# Patient Record
Sex: Male | Born: 1970
Health system: Southern US, Community
[De-identification: ages and names within clinical notes are randomized; demographics above are authoritative.]

## PROBLEM LIST (undated history)

## (undated) DIAGNOSIS — K219 Gastro-esophageal reflux disease without esophagitis: Secondary | ICD-10-CM

## (undated) DIAGNOSIS — I10 Essential (primary) hypertension: Secondary | ICD-10-CM

## (undated) DIAGNOSIS — F419 Anxiety disorder, unspecified: Secondary | ICD-10-CM

## (undated) DIAGNOSIS — M5126 Other intervertebral disc displacement, lumbar region: Secondary | ICD-10-CM

## (undated) DIAGNOSIS — G4730 Sleep apnea, unspecified: Secondary | ICD-10-CM

## (undated) HISTORY — PX: OTHER SURGICAL HISTORY: SHX169

## (undated) HISTORY — PX: PILONIDAL CYST EXCISION: SHX744

---

## 2006-01-09 ENCOUNTER — Ambulatory Visit (HOSPITAL_COMMUNITY): Admission: RE | Admit: 2006-01-09 | Discharge: 2006-01-10 | Payer: Self-pay | Admitting: Neurological Surgery

## 2011-01-03 ENCOUNTER — Other Ambulatory Visit: Payer: Self-pay | Admitting: Specialist

## 2011-01-03 ENCOUNTER — Ambulatory Visit (HOSPITAL_COMMUNITY)
Admission: RE | Admit: 2011-01-03 | Discharge: 2011-01-03 | Disposition: A | Discharge status: Home / Self Care | Payer: 59 | Source: Ambulatory Visit | Attending: Specialist | Admitting: Specialist

## 2011-01-03 ENCOUNTER — Other Ambulatory Visit (HOSPITAL_COMMUNITY): Payer: Self-pay | Admitting: Specialist

## 2011-01-03 ENCOUNTER — Encounter (HOSPITAL_COMMUNITY): Payer: 59

## 2011-01-03 DIAGNOSIS — M5126 Other intervertebral disc displacement, lumbar region: Secondary | ICD-10-CM | POA: Insufficient documentation

## 2011-01-03 DIAGNOSIS — Z01812 Encounter for preprocedural laboratory examination: Secondary | ICD-10-CM | POA: Insufficient documentation

## 2011-01-03 DIAGNOSIS — Z01818 Encounter for other preprocedural examination: Secondary | ICD-10-CM

## 2011-01-03 LAB — PROTIME-INR: INR: 1.02 (ref 0.00–1.49)

## 2011-01-03 LAB — COMPREHENSIVE METABOLIC PANEL
CO2: 29 mEq/L (ref 19–32)
Calcium: 10.1 mg/dL (ref 8.4–10.5)
GFR calc non Af Amer: >90 mL/min (ref >90)
Total Bilirubin: 0.6 mg/dL (ref 0.3–1.2)
Total Protein: 7.4 g/dL (ref 6.0–8.3)

## 2011-01-03 LAB — URINALYSIS, ROUTINE W REFLEX MICROSCOPIC
Hgb urine dipstick: NEGATIVE
Ketones, ur: NEGATIVE mg/dL
Leukocytes, UA: NEGATIVE
Nitrite: NEGATIVE
Specific Gravity, Urine: 1.016 (ref 1.005–1.030)
pH: 6.5 (ref 5.0–8.0)

## 2011-01-03 LAB — SURGICAL PCR SCREEN: MRSA, PCR: NEGATIVE

## 2011-01-03 LAB — CBC
HCT: 45.5 % (ref 39.0–52.0)
Hemoglobin: 16 g/dL (ref 13.0–17.0)
MCV: 93.8 fL (ref 78.0–100.0)
RDW: 12.4 % (ref 11.5–15.5)

## 2011-01-03 LAB — APTT: aPTT: 29 seconds (ref 24–37)

## 2011-01-05 ENCOUNTER — Ambulatory Visit (HOSPITAL_COMMUNITY): Payer: 59

## 2011-01-05 ENCOUNTER — Ambulatory Visit (HOSPITAL_COMMUNITY)
Admission: RE | Admit: 2011-01-05 | Discharge: 2011-01-06 | Disposition: A | Discharge status: Home / Self Care | Payer: 59 | Source: Ambulatory Visit | Attending: Specialist | Admitting: Specialist

## 2011-01-05 DIAGNOSIS — G4733 Obstructive sleep apnea (adult) (pediatric): Secondary | ICD-10-CM | POA: Insufficient documentation

## 2011-01-05 DIAGNOSIS — F17200 Nicotine dependence, unspecified, uncomplicated: Secondary | ICD-10-CM | POA: Insufficient documentation

## 2011-01-05 DIAGNOSIS — Z01812 Encounter for preprocedural laboratory examination: Secondary | ICD-10-CM | POA: Insufficient documentation

## 2011-01-05 DIAGNOSIS — M5126 Other intervertebral disc displacement, lumbar region: Secondary | ICD-10-CM | POA: Insufficient documentation

## 2011-01-06 NOTE — Op Note (Signed)
Ryan Holland, Ryan NO.:  [id number]  MEDICAL RECORD NO.:  [id number]  LOCATION:  1608                         FACILITY:  [facility]  PHYSICIAN:  Jene Every, M.D.    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  POSTOPERATIVE DIAGNOSES:  Spinal stenosis, herniated nucleus pulposus at L4-L5 left and L5-S1 right.  PROCEDURE PERFORMED: 1. Lumbar decompression L4-L5 left with foraminotomies of L4 and L5     with microdiskectomy L4-L5, left. 2. Lumbar decompression L5-S1 on the right with separate fascial     incision with foraminotomies at L5-S1 right with microdiscectomy L5-     S1 right.  ANESTHESIA:  General.  ASSISTANT:  Roma Schanz, P.A.  BRIEF HISTORY:  This is a 40 year old with bilateral L5 radiculopathy, myotomal weakness, dermatomal dysesthesias, refractory to conservative treatment.  He had an MRI indicating a disk herniation, lateral recess stenosis at L4-L5 on the right and he had a disk herniation, lateral recess stenosis at L5-S1 on the left.  He was indicated for decompression at both levels given the positive neural tension signs and EHL weakness bilaterally as well as diminished plantar flexion on the left.  Risks and benefits were discussed including bleeding, infection, damage to neurovascular structures, CSF leakage, epidural fibrosis, adjacent segment disease, need for fusion in future, DVT, PE, anesthetic complications, etc.  TECHNIQUE:  The patient in supine position after induction of adequate general anesthesia and 2 g Kefzol, he was placed prone on the [place] frame.  All bony prominences were well padded.  Lumbar region was prepped and draped in usual sterile fashion.  Two 18-gauge spinal needles were utilized to localize the L4-L5 and L5-S1  interspace, confirmed with x-ray.  Incision was made on the left and between L4 and L5, and on the right between L5 and S1.  In the dorsolumbar fascia, paraspinous muscle was elevated from L4-L5 on the left and L5-S1 on the right.  Operating microscope was draped and brought in the surgical field.  Hemilaminotomy of the caudad edge of L4 was performed with a 3 mm Kerrison preserving the pars.  There was a hypertrophic facet here in the superior articulating process of L4, with a prominent spur.  I used a micro curette to skeletonize this process, detach ligamentum flavum, and perform a partial medial hemifacetectomy with a 2 mm Kerrison. Perform a foraminotomy of L5 identifying the L5 root, which was compressing the lateral recess severely, gently mobilizing it medially. There was an epidural venous plexus, tethering the root.  This was cauterized and mobilized.  Focal HNP was noted.  We had performed a foraminotomy at L4 as well.  An annulotomy was performed and copious disk material was removed with a straight and a micropituitary as well as an upbiting pituitary with intermittent release of the L5 root. Following the diskectomy, the L5 root was decompressed to release 1 cm of excursion medial to the pedicle.  Hockey-stick probe passed freely up the foramen of L4 and L5.  Disk space copiously irrigated with  antibiotic irrigation as was the laminotomy defect.  No evidence of CSF leakage or active bleeding and a confirmatory radiograph obtained, identified the L4-L5 interspace following completion.  The dorsolumbar fascia was then reapproximated with #1 Vicryl with interrupted figure-of- 8 sutures.  Attention was then turned towards the right L5-S1. McCullough retractor was placed there in a similar fashion.  The detached ligamentum flavum from the cephalad edge of S1 on the caudad edge of L5 performed a hemilaminotomy.  Ligamentum flavum removed from the interspace.  Neuro patties were  used at both L4-L5 and L5-S1 to protect the neural elements.  Severe lateral recess stenosis was noted multifactorial due to ligamentum flavum hypertrophy and a focal disk herniation.  After foraminotomy of S1 was performed, we gently mobilized the S1 nerve root medially, identified a focal HNP.  Performed an annulotomy and performed a diskectomy with  micro and straight pituitary. Multiple small fragments were then removed.  Foraminotomy of L5 was performed.  There was slight stenosis noted here.  Disk space copiously irrigated with antibiotic irrigation.  At least we had an adequate decompression of the L5 and S1 nerve roots with a cm of excursion of the S1 nerve root without tension.  We checked beneath the thecal sac, the axilla, the root, and both foramen as we did at L4-L5. No residual disk herniation or compression of L5 or S1 nerve root. Inspection revealed no CSF leakage or active bleeding.  We therefore, removed the [facility] retractor.  Copiously irrigated the wound, closed the fascia with #1 Vicryl interrupted figure-of-8 sutures and the subcu with 2-0 Vicryl simple sutures.  Wounds were reapproximated with 4-0 subcuticular Prolene, reinforced with Steri-Strips.  Sterile dressing applied.  Placed supine on the hospital bed, extubated without difficulty, and transported to the recovery room in satisfactory condition.  The patient tolerated the procedure well.  No complications.  Assistant Roma Schanz, was utilized for general intermittent neural traction, closure, and irrigation.  Minimal blood loss.     Jene Every, M.D.     Cordelia Pen  D:  01/05/2011  T:  01/05/2011  Job:  [id number]  Electronically Signed by Jene Every M.D. on 01/06/2011 04:37:25 PM

## 2011-01-30 ENCOUNTER — Ambulatory Visit: Payer: 59 | Attending: Specialist | Admitting: Physical Therapy

## 2011-01-30 DIAGNOSIS — M545 Low back pain, unspecified: Secondary | ICD-10-CM | POA: Insufficient documentation

## 2011-01-30 DIAGNOSIS — R5381 Other malaise: Secondary | ICD-10-CM | POA: Insufficient documentation

## 2011-01-30 DIAGNOSIS — IMO0001 Reserved for inherently not codable concepts without codable children: Secondary | ICD-10-CM | POA: Insufficient documentation

## 2011-02-02 ENCOUNTER — Ambulatory Visit: Payer: 59 | Admitting: *Deleted

## 2011-02-07 ENCOUNTER — Ambulatory Visit: Payer: 59 | Attending: Specialist | Admitting: *Deleted

## 2011-02-07 DIAGNOSIS — M545 Low back pain, unspecified: Secondary | ICD-10-CM | POA: Insufficient documentation

## 2011-02-07 DIAGNOSIS — R5381 Other malaise: Secondary | ICD-10-CM | POA: Insufficient documentation

## 2011-02-07 DIAGNOSIS — IMO0001 Reserved for inherently not codable concepts without codable children: Secondary | ICD-10-CM | POA: Insufficient documentation

## 2011-02-09 ENCOUNTER — Ambulatory Visit: Payer: 59 | Admitting: *Deleted

## 2011-02-14 ENCOUNTER — Ambulatory Visit: Payer: 59 | Admitting: *Deleted

## 2011-02-16 ENCOUNTER — Ambulatory Visit: Payer: 59 | Admitting: *Deleted

## 2011-02-21 ENCOUNTER — Encounter: Payer: 59 | Admitting: Physical Therapy

## 2011-02-23 ENCOUNTER — Ambulatory Visit: Payer: 59 | Admitting: Physical Therapy

## 2011-03-02 ENCOUNTER — Ambulatory Visit: Payer: 59 | Admitting: Physical Therapy

## 2011-03-09 ENCOUNTER — Ambulatory Visit: Payer: 59 | Attending: Specialist | Admitting: Physical Therapy

## 2011-03-09 DIAGNOSIS — IMO0001 Reserved for inherently not codable concepts without codable children: Secondary | ICD-10-CM | POA: Insufficient documentation

## 2011-03-09 DIAGNOSIS — R5381 Other malaise: Secondary | ICD-10-CM | POA: Insufficient documentation

## 2011-03-09 DIAGNOSIS — M545 Low back pain, unspecified: Secondary | ICD-10-CM | POA: Insufficient documentation

## 2011-03-14 ENCOUNTER — Ambulatory Visit: Payer: 59 | Admitting: *Deleted

## 2011-03-16 ENCOUNTER — Ambulatory Visit: Payer: 59 | Admitting: *Deleted

## 2011-03-21 ENCOUNTER — Ambulatory Visit: Payer: 59 | Admitting: *Deleted

## 2011-03-23 ENCOUNTER — Encounter: Payer: 59 | Admitting: *Deleted

## 2011-06-13 ENCOUNTER — Other Ambulatory Visit: Payer: Self-pay | Admitting: Specialist

## 2011-06-13 DIAGNOSIS — M545 Low back pain, unspecified: Secondary | ICD-10-CM

## 2011-06-15 ENCOUNTER — Ambulatory Visit
Admission: RE | Admit: 2011-06-15 | Discharge: 2011-06-15 | Disposition: A | Discharge status: Home / Self Care | Payer: Worker's Compensation | Source: Ambulatory Visit | Attending: Specialist | Admitting: Specialist

## 2011-06-15 VITALS — BP 132/74 | HR 82

## 2011-06-15 DIAGNOSIS — M545 Low back pain: Secondary | ICD-10-CM

## 2011-06-15 MED ORDER — IOHEXOL 180 MG/ML  SOLN
17.0000 mL | Freq: Once | INTRAMUSCULAR | Status: AC | PRN
  Administered 2011-06-15: 17 mL via INTRATHECAL

## 2011-06-15 MED ORDER — DIAZEPAM 5 MG PO TABS
10.0000 mg | ORAL_TABLET | Freq: Once | ORAL | Status: AC
  Administered 2011-06-15: 10 mg via ORAL

## 2011-06-15 MED ORDER — MEPERIDINE HCL 100 MG/ML IJ SOLN
75.0000 mg | Freq: Once | INTRAMUSCULAR | Status: AC
  Administered 2011-06-15: 75 mg via INTRAMUSCULAR

## 2011-06-15 MED ORDER — HYDROMORPHONE HCL 4 MG PO TABS
4.0000 mg | ORAL_TABLET | Freq: Once | ORAL | Status: AC
  Administered 2011-06-15: 4 mg via ORAL

## 2011-06-15 MED ORDER — ONDANSETRON HCL 4 MG/2ML IJ SOLN
4.0000 mg | Freq: Once | INTRAMUSCULAR | Status: AC
  Administered 2011-06-15: 4 mg via INTRAMUSCULAR

## 2011-06-15 NOTE — Progress Notes (Signed)
Patient states his pain level is 8-9/10 half an hour after prior medication.  jkl

## 2011-06-15 NOTE — Discharge Instructions (Signed)
Myelogram Discharge Instructions  1. Go home and rest quietly for the next 24 hours.  It is important to lie flat for the next 24 hours.  Get up only to go to the restroom.  You may lie in the bed or on a couch on your back, your stomach, your left side or your right side.  You may have one pillow under your head.  You may have pillows between your knees while you are on your side or under your knees while you are on your back.  2. DO NOT drive today.  Recline the seat as far back as it will go, while still wearing your seat belt, on the way home.  3. You may get up to go to the bathroom as needed.  You may sit up for 10 minutes to eat.  You may resume your normal diet and medications unless otherwise indicated.  Drink lots of extra fluids today and tomorrow.  4. The incidence of headache, nausea, or vomiting is about 5% (one in 20 patients).  If you develop a headache, lie flat and drink plenty of fluids until the headache goes away.  Caffeinated beverages may be helpful.  If you develop severe nausea and vomiting or a headache that does not go away with flat bed rest, call [phone].  5. You may resume normal activities after your 24 hours of bed rest is over; however, do not exert yourself strongly or do any heavy lifting tomorrow. If when you get up you have a headache when standing, go back to bed and force fluids for another 24 hours.  6. Call your physician for a follow-up appointment.  The results of your myelogram will be sent directly to your physician by the following day.  7. If you have any questions or if complications develop after you arrive home, please call [phone].  Discharge instructions have been explained to the patient.  The patient, or the person responsible for the patient, fully understands these instructions.       May resume citalopram on June 16, 2011, after 9:30 am.

## 2011-06-15 NOTE — Progress Notes (Signed)
Patient resting quietly with eyes closed; in no apparent distress.  jkl

## 2011-06-15 NOTE — Progress Notes (Signed)
Pt states he has been off citalopram for the past 2 days. dd

## 2011-07-07 ENCOUNTER — Other Ambulatory Visit: Payer: Self-pay | Admitting: Otolaryngology

## 2011-07-17 ENCOUNTER — Encounter (HOSPITAL_COMMUNITY): Payer: Self-pay | Admitting: Pharmacy Technician

## 2011-07-24 ENCOUNTER — Encounter (HOSPITAL_COMMUNITY): Payer: Self-pay

## 2011-07-24 ENCOUNTER — Encounter (HOSPITAL_COMMUNITY)
Admission: RE | Admit: 2011-07-24 | Discharge: 2011-07-24 | Disposition: A | Discharge status: Home / Self Care | Payer: Worker's Compensation | Source: Ambulatory Visit | Attending: Specialist | Admitting: Specialist

## 2011-07-24 ENCOUNTER — Ambulatory Visit (HOSPITAL_COMMUNITY)
Admission: RE | Admit: 2011-07-24 | Discharge: 2011-07-24 | Disposition: A | Discharge status: Home / Self Care | Payer: Worker's Compensation | Source: Ambulatory Visit | Attending: Otolaryngology | Admitting: Otolaryngology

## 2011-07-24 DIAGNOSIS — M5137 Other intervertebral disc degeneration, lumbosacral region: Secondary | ICD-10-CM | POA: Insufficient documentation

## 2011-07-24 DIAGNOSIS — M51379 Other intervertebral disc degeneration, lumbosacral region without mention of lumbar back pain or lower extremity pain: Secondary | ICD-10-CM | POA: Insufficient documentation

## 2011-07-24 DIAGNOSIS — Z01818 Encounter for other preprocedural examination: Secondary | ICD-10-CM | POA: Insufficient documentation

## 2011-07-24 HISTORY — DX: Sleep apnea, unspecified: G47.30

## 2011-07-24 HISTORY — DX: Gastro-esophageal reflux disease without esophagitis: K21.9

## 2011-07-24 HISTORY — DX: Other intervertebral disc displacement, lumbar region: M51.26

## 2011-07-24 HISTORY — DX: Essential (primary) hypertension: I10

## 2011-07-24 HISTORY — DX: Anxiety disorder, unspecified: F41.9

## 2011-07-24 LAB — URINALYSIS, ROUTINE W REFLEX MICROSCOPIC
Bilirubin Urine: NEGATIVE
Glucose, UA: NEGATIVE mg/dL
Hgb urine dipstick: NEGATIVE
Specific Gravity, Urine: 1.014 (ref 1.005–1.030)
pH: 6 (ref 5.0–8.0)

## 2011-07-24 LAB — COMPREHENSIVE METABOLIC PANEL
AST: 44 U/L — ABNORMAL HIGH (ref 0–37)
Albumin: 4.2 g/dL (ref 3.5–5.2)
Chloride: 99 mEq/L (ref 96–112)
Creatinine, Ser: 0.86 mg/dL (ref 0.50–1.35)
Total Bilirubin: 0.3 mg/dL (ref 0.3–1.2)

## 2011-07-24 LAB — CBC
MCV: 93.1 fL (ref 78.0–100.0)
Platelets: 275 10*3/uL (ref 150–400)
RDW: 12.3 % (ref 11.5–15.5)
WBC: 9.1 10*3/uL (ref 4.0–10.5)

## 2011-07-24 LAB — DIFFERENTIAL
Basophils Relative: 1 % (ref 0–1)
Lymphocytes Relative: 27 % (ref 12–46)
Lymphs Abs: 2.4 10*3/uL (ref 0.7–4.0)
Monocytes Absolute: 0.8 10*3/uL (ref 0.1–1.0)
Monocytes Relative: 8 % (ref 3–12)
Neutro Abs: 5.5 10*3/uL (ref 1.7–7.7)

## 2011-07-24 NOTE — Patient Instructions (Signed)
YOUR SURGERY IS SCHEDULED ON:  WED  5/22  AT 10:30 AM  REPORT TO [place] [facility]ORT STAY [facility] AT:  8:30 AM      PHONE # FOR SHORT STAY IS [phone]  DO NOT EAT OR DRINK ANYTHING AFTER MIDNIGHT THE NIGHT BEFORE YOUR SURGERY.  YOU MAY BRUSH YOUR TEETH, RINSE OUT YOUR MOUTH--BUT NO WATER, NO FOOD, NO CHEWING GUM, NO MINTS, NO CANDIES, NO CHEWING TOBACCO.  PLEASE TAKE THE FOLLOWING MEDICATIONS THE AM OF YOUR SURGERY WITH A FEW SIPS OF WATER:  CELEXA,  OXYCODONE / ACETAMINOPHEN IF NEEDED FOR PAIN.    IF YOU USE INHALERS--USE YOUR INHALERS THE AM OF YOUR SURGERY AND BRING INHALERS TO THE HOSPITAL -TAKE TO SURGERY.    IF YOU ARE DIABETIC:  DO NOT TAKE ANY DIABETIC MEDICATIONS THE AM OF YOUR SURGERY.  IF YOU TAKE INSULIN IN THE EVENINGS--PLEASE ONLY TAKE 1/2 NORMAL EVENING DOSE THE NIGHT BEFORE YOUR SURGERY.  NO INSULIN THE AM OF YOUR SURGERY.  IF YOU HAVE SLEEP APNEA AND USE CPAP OR BIPAP--PLEASE BRING THE MASK --NOT THE MACHINE-NOT THE TUBING   -JUST THE MASK. DO NOT BRING VALUABLES, MONEY, CREDIT CARDS.  CONTACT LENS, DENTURES / PARTIALS, GLASSES SHOULD NOT BE WORN TO SURGERY AND IN MOST CASES-HEARING AIDS WILL NEED TO BE REMOVED.  BRING YOUR GLASSES CASE, ANY EQUIPMENT NEEDED FOR YOUR CONTACT LENS. FOR PATIENTS ADMITTED TO THE HOSPITAL--CHECK OUT TIME THE DAY OF DISCHARGE IS 11:00 AM.  ALL INPATIENT ROOMS ARE PRIVATE - WITH BATHROOM, TELEPHONE, TELEVISION AND WIFI INTERNET. IF YOU ARE BEING DISCHARGED THE SAME DAY OF YOUR SURGERY--YOU CAN NOT DRIVE YOURSELF HOME--AND SHOULD NOT GO HOME ALONE BY TAXI OR BUS.  NO DRIVING OR OPERATING MACHINERY FOR 24 HOURS FOLLOWING ANESTHESIA / PAIN MEDICATIONS.                            SPECIAL INSTRUCTIONS:  CHLORHEXIDINE SOAP SHOWER (other brand names are Betasept and Hibiclens ) PLEASE SHOWER WITH CHLORHEXIDINE THE NIGHT BEFORE YOUR SURGERY AND THE AM OF YOUR SURGERY. DO NOT USE CHLORHEXIDINE ON YOUR FACE OR PRIVATE AREAS--YOU MAY USE YOUR NORMAL SOAP  THOSE AREAS AND YOUR NORMAL SHAMPOO.  WOMEN SHOULD AVOID SHAVING UNDER ARMS AND SHAVING LEGS 48 HOURS BEFORE USING CHLORHEXIDINE TO AVOID SKIN IRRITATION.  DO NOT USE IF ALLERGIC TO CHLORHEXIDINE.  PLEASE READ OVER ANY  FACT SHEETS THAT YOU WERE GIVEN: MRSA INFORMATION, INCENTIVE SPIROMETER INFORMATION.

## 2011-07-24 NOTE — Pre-Procedure Instructions (Addendum)
CBC WITH DIFF, CMET, UA AND LUMBAR SPINE XRAY WERE DONE TODAY - PREOP-AT [facility].   PT HAS EKG REPORT FROM [facility] THAT WAS DONE 01/05/11.  -REPORT IN EPIC AND COPY ON PT'S CHART. PT HAS CXR REPORT FROM [facility] THAT WAS DONE 09/21/10. REPORT IS ON PT'S CHART.

## 2011-07-26 ENCOUNTER — Ambulatory Visit (HOSPITAL_COMMUNITY): Payer: Worker's Compensation

## 2011-07-26 ENCOUNTER — Encounter (HOSPITAL_COMMUNITY): Payer: Self-pay | Admitting: Anesthesiology

## 2011-07-26 ENCOUNTER — Ambulatory Visit (HOSPITAL_COMMUNITY): Payer: Worker's Compensation | Admitting: Anesthesiology

## 2011-07-26 ENCOUNTER — Encounter (HOSPITAL_COMMUNITY)
Admission: RE | Disposition: A | Discharge status: Home / Self Care | Payer: Self-pay | Source: Ambulatory Visit | Attending: Specialist

## 2011-07-26 ENCOUNTER — Encounter (HOSPITAL_COMMUNITY): Payer: Self-pay | Admitting: *Deleted

## 2011-07-26 ENCOUNTER — Observation Stay (HOSPITAL_COMMUNITY)
Admission: RE | Admit: 2011-07-26 | Discharge: 2011-07-28 | Disposition: A | Discharge status: Home / Self Care | Payer: Worker's Compensation | Source: Ambulatory Visit | Attending: Specialist | Admitting: Specialist

## 2011-07-26 DIAGNOSIS — M48061 Spinal stenosis, lumbar region without neurogenic claudication: Secondary | ICD-10-CM | POA: Insufficient documentation

## 2011-07-26 DIAGNOSIS — Z981 Arthrodesis status: Secondary | ICD-10-CM | POA: Insufficient documentation

## 2011-07-26 DIAGNOSIS — I10 Essential (primary) hypertension: Secondary | ICD-10-CM | POA: Insufficient documentation

## 2011-07-26 DIAGNOSIS — Z79899 Other long term (current) drug therapy: Secondary | ICD-10-CM | POA: Insufficient documentation

## 2011-07-26 DIAGNOSIS — M5126 Other intervertebral disc displacement, lumbar region: Principal | ICD-10-CM | POA: Insufficient documentation

## 2011-07-26 DIAGNOSIS — IMO0002 Reserved for concepts with insufficient information to code with codable children: Secondary | ICD-10-CM

## 2011-07-26 DIAGNOSIS — G4730 Sleep apnea, unspecified: Secondary | ICD-10-CM | POA: Insufficient documentation

## 2011-07-26 DIAGNOSIS — K219 Gastro-esophageal reflux disease without esophagitis: Secondary | ICD-10-CM | POA: Insufficient documentation

## 2011-07-26 HISTORY — PX: LUMBAR LAMINECTOMY/DECOMPRESSION MICRODISCECTOMY: SHX5026

## 2011-07-26 SURGERY — LUMBAR LAMINECTOMY/DECOMPRESSION MICRODISCECTOMY
Anesthesia: General | Site: Back | Laterality: Right | Wound class: Clean

## 2011-07-26 MED ORDER — POLYETHYLENE GLYCOL 3350 17 G PO PACK
17.0000 g | PACK | Freq: Every day | ORAL | Status: DC | PRN
  Filled 2011-07-26: qty 1

## 2011-07-26 MED ORDER — OXYCODONE-ACETAMINOPHEN 5-325 MG PO TABS
1.0000 | ORAL_TABLET | ORAL | Status: DC | PRN
  Administered 2011-07-26 – 2011-07-27 (×2): 1 via ORAL
  Filled 2011-07-26 (×2): qty 1

## 2011-07-26 MED ORDER — LACTATED RINGERS IV SOLN
INTRAVENOUS | Status: DC
  Administered 2011-07-26: 12:00:00 via INTRAVENOUS
  Administered 2011-07-26: 1000 mL via INTRAVENOUS

## 2011-07-26 MED ORDER — OXYCODONE-ACETAMINOPHEN 7.5-325 MG PO TABS: 1.0000 | ORAL_TABLET | ORAL | Status: DC | PRN

## 2011-07-26 MED ORDER — PHENOL 1.4 % MT LIQD: 1.0000 | OROMUCOSAL | Status: DC | PRN

## 2011-07-26 MED ORDER — SODIUM CHLORIDE 0.9 % IJ SOLN: 3.0000 mL | Freq: Two times a day (BID) | INTRAMUSCULAR | Status: DC

## 2011-07-26 MED ORDER — ONDANSETRON HCL 4 MG/2ML IJ SOLN: 4.0000 mg | INTRAMUSCULAR | Status: DC | PRN

## 2011-07-26 MED ORDER — LISINOPRIL 10 MG PO TABS
10.0000 mg | ORAL_TABLET | Freq: Every day | ORAL | Status: DC
  Administered 2011-07-26 – 2011-07-28 (×3): 10 mg via ORAL
  Filled 2011-07-26 (×3): qty 1

## 2011-07-26 MED ORDER — METHOCARBAMOL 500 MG PO TABS
500.0000 mg | ORAL_TABLET | Freq: Four times a day (QID) | ORAL | Status: DC | PRN
  Filled 2011-07-26: qty 1

## 2011-07-26 MED ORDER — CHLORHEXIDINE GLUCONATE 4 % EX LIQD
60.0000 mL | Freq: Once | CUTANEOUS | Status: DC
  Filled 2011-07-26: qty 60

## 2011-07-26 MED ORDER — THROMBIN 5000 UNITS EX SOLR
CUTANEOUS | Status: DC | PRN
  Administered 2011-07-26: 5000 [IU] via TOPICAL

## 2011-07-26 MED ORDER — FENTANYL CITRATE 0.05 MG/ML IJ SOLN: 25.0000 ug | INTRAMUSCULAR | Status: DC | PRN

## 2011-07-26 MED ORDER — CITALOPRAM HYDROBROMIDE 40 MG PO TABS
40.0000 mg | ORAL_TABLET | Freq: Every day | ORAL | Status: DC
  Administered 2011-07-27 – 2011-07-28 (×2): 40 mg via ORAL
  Filled 2011-07-26 (×3): qty 1

## 2011-07-26 MED ORDER — ACETAMINOPHEN 650 MG RE SUPP: 650.0000 mg | RECTAL | Status: DC | PRN

## 2011-07-26 MED ORDER — METHOCARBAMOL 100 MG/ML IJ SOLN
500.0000 mg | Freq: Four times a day (QID) | INTRAVENOUS | Status: DC | PRN
  Administered 2011-07-26 – 2011-07-27 (×3): 500 mg via INTRAVENOUS
  Filled 2011-07-26 (×3): qty 5

## 2011-07-26 MED ORDER — ONDANSETRON HCL 4 MG/2ML IJ SOLN
INTRAMUSCULAR | Status: DC | PRN
  Administered 2011-07-26: 4 mg via INTRAVENOUS

## 2011-07-26 MED ORDER — CEFAZOLIN SODIUM-DEXTROSE 2-3 GM-% IV SOLR
2.0000 g | Freq: Three times a day (TID) | INTRAVENOUS | Status: AC
  Administered 2011-07-26 – 2011-07-27 (×2): 2 g via INTRAVENOUS
  Filled 2011-07-26 (×3): qty 50

## 2011-07-26 MED ORDER — ACETAMINOPHEN 325 MG PO TABS: 650.0000 mg | ORAL_TABLET | ORAL | Status: DC | PRN

## 2011-07-26 MED ORDER — HYDROMORPHONE HCL PF 1 MG/ML IJ SOLN
INTRAMUSCULAR | Status: AC
  Administered 2011-07-26: 1 mg via INTRAVENOUS
  Filled 2011-07-26: qty 1

## 2011-07-26 MED ORDER — MIDAZOLAM HCL 5 MG/5ML IJ SOLN
INTRAMUSCULAR | Status: DC | PRN
  Administered 2011-07-26: 2 mg via INTRAVENOUS

## 2011-07-26 MED ORDER — HYDROMORPHONE HCL PF 1 MG/ML IJ SOLN
0.2500 mg | INTRAMUSCULAR | Status: DC | PRN
  Administered 2011-07-26 (×4): 0.5 mg via INTRAVENOUS

## 2011-07-26 MED ORDER — HYDROMORPHONE HCL PF 1 MG/ML IJ SOLN
0.5000 mg | INTRAMUSCULAR | Status: DC | PRN
  Administered 2011-07-26 – 2011-07-27 (×4): 1 mg via INTRAVENOUS
  Filled 2011-07-26 (×3): qty 1

## 2011-07-26 MED ORDER — OXYCODONE HCL 5 MG PO TABS
2.5000 mg | ORAL_TABLET | ORAL | Status: DC | PRN
  Administered 2011-07-26 – 2011-07-27 (×2): 2.5 mg via ORAL
  Filled 2011-07-26 (×2): qty 1

## 2011-07-26 MED ORDER — ROCURONIUM BROMIDE 100 MG/10ML IV SOLN
INTRAVENOUS | Status: DC | PRN
  Administered 2011-07-26: 10 mg via INTRAVENOUS
  Administered 2011-07-26: 50 mg via INTRAVENOUS
  Administered 2011-07-26: 20 mg via INTRAVENOUS

## 2011-07-26 MED ORDER — NEOSTIGMINE METHYLSULFATE 1 MG/ML IJ SOLN
INTRAMUSCULAR | Status: DC | PRN
  Administered 2011-07-26: 4 mg via INTRAVENOUS

## 2011-07-26 MED ORDER — KETOROLAC TROMETHAMINE 30 MG/ML IJ SOLN
15.0000 mg | Freq: Once | INTRAMUSCULAR | Status: AC | PRN
  Administered 2011-07-26: 30 mg via INTRAVENOUS

## 2011-07-26 MED ORDER — DOCUSATE SODIUM 100 MG PO CAPS
100.0000 mg | ORAL_CAPSULE | Freq: Two times a day (BID) | ORAL | Status: DC
  Administered 2011-07-26 – 2011-07-28 (×4): 100 mg via ORAL
  Filled 2011-07-26 (×5): qty 1

## 2011-07-26 MED ORDER — SUFENTANIL CITRATE 50 MCG/ML IV SOLN
INTRAVENOUS | Status: DC | PRN
  Administered 2011-07-26 (×2): 5 ug via INTRAVENOUS
  Administered 2011-07-26: 15 ug via INTRAVENOUS
  Administered 2011-07-26: 5 ug via INTRAVENOUS
  Administered 2011-07-26: 10 ug via INTRAVENOUS

## 2011-07-26 MED ORDER — SODIUM CHLORIDE 0.9 % IR SOLN
Status: DC | PRN
  Administered 2011-07-26: 12:00:00

## 2011-07-26 MED ORDER — BUPIVACAINE-EPINEPHRINE 0.5% -1:200000 IJ SOLN
INTRAMUSCULAR | Status: DC | PRN
  Administered 2011-07-26: 50 mL

## 2011-07-26 MED ORDER — MENTHOL 3 MG MT LOZG
1.0000 | LOZENGE | OROMUCOSAL | Status: DC | PRN
  Filled 2011-07-26: qty 9

## 2011-07-26 MED ORDER — PROPOFOL 10 MG/ML IV EMUL
INTRAVENOUS | Status: DC | PRN
  Administered 2011-07-26: 200 mg via INTRAVENOUS

## 2011-07-26 MED ORDER — LISINOPRIL-HYDROCHLOROTHIAZIDE 10-12.5 MG PO TABS: 1.0000 | ORAL_TABLET | Freq: Every day | ORAL | Status: DC

## 2011-07-26 MED ORDER — SODIUM CHLORIDE 0.9 % IJ SOLN: 3.0000 mL | INTRAMUSCULAR | Status: DC | PRN

## 2011-07-26 MED ORDER — PROMETHAZINE HCL 25 MG/ML IJ SOLN: 6.2500 mg | INTRAMUSCULAR | Status: DC | PRN

## 2011-07-26 MED ORDER — SODIUM CHLORIDE 0.9 % IV SOLN
INTRAVENOUS | Status: DC
  Administered 2011-07-26 – 2011-07-27 (×2): via INTRAVENOUS

## 2011-07-26 MED ORDER — SODIUM CHLORIDE 0.9 % IV SOLN: 250.0000 mL | INTRAVENOUS | Status: DC

## 2011-07-26 MED ORDER — LIDOCAINE HCL (CARDIAC) 20 MG/ML IV SOLN
INTRAVENOUS | Status: DC | PRN
  Administered 2011-07-26: 75 mg via INTRAVENOUS

## 2011-07-26 MED ORDER — CEFAZOLIN SODIUM-DEXTROSE 2-3 GM-% IV SOLR
2.0000 g | INTRAVENOUS | Status: AC
  Administered 2011-07-26: 2 g via INTRAVENOUS

## 2011-07-26 MED ORDER — HYDROCHLOROTHIAZIDE 12.5 MG PO CAPS
12.5000 mg | ORAL_CAPSULE | Freq: Every day | ORAL | Status: DC
  Administered 2011-07-26 – 2011-07-28 (×3): 12.5 mg via ORAL
  Filled 2011-07-26 (×3): qty 1

## 2011-07-26 MED ORDER — BISACODYL 10 MG RE SUPP: 10.0000 mg | Freq: Every day | RECTAL | Status: DC | PRN

## 2011-07-26 SURGICAL SUPPLY — 53 items
APL SKNCLS STERI-STRIP NONHPOA (GAUZE/BANDAGES/DRESSINGS) ×1
BAG SPEC THK2 15X12 ZIP CLS (MISCELLANEOUS) ×1
BAG ZIPLOCK 12X15 (MISCELLANEOUS) ×2 IMPLANT
BENZOIN TINCTURE PRP APPL 2/3 (GAUZE/BANDAGES/DRESSINGS) ×3 IMPLANT
CATH FOLEY 2WAY SLVR  5CC 14FR (CATHETERS) ×1
CATH FOLEY 2WAY SLVR 5CC 14FR (CATHETERS) IMPLANT
CHLORAPREP W/TINT 26ML (MISCELLANEOUS) IMPLANT
CLEANER TIP ELECTROSURG 2X2 (MISCELLANEOUS) ×2 IMPLANT
CLOTH BEACON ORANGE TIMEOUT ST (SAFETY) ×2 IMPLANT
DECANTER SPIKE VIAL GLASS SM (MISCELLANEOUS) ×2 IMPLANT
DRAPE MICROSCOPE LEICA (MISCELLANEOUS) ×3 IMPLANT
DRAPE POUCH INSTRU U-SHP 10X18 (DRAPES) ×2 IMPLANT
DRAPE SURG 17X11 SM STRL (DRAPES) ×2 IMPLANT
DRSG EMULSION OIL 3X3 NADH (GAUZE/BANDAGES/DRESSINGS) ×2 IMPLANT
DRSG PAD ABDOMINAL 8X10 ST (GAUZE/BANDAGES/DRESSINGS) ×2 IMPLANT
DRSG TELFA 4X5 ISLAND ADH (GAUZE/BANDAGES/DRESSINGS) IMPLANT
DURAPREP 26ML APPLICATOR (WOUND CARE) ×2 IMPLANT
ELECT REM PT RETURN 9FT ADLT (ELECTROSURGICAL) ×2
ELECTRODE REM PT RTRN 9FT ADLT (ELECTROSURGICAL) ×1 IMPLANT
GLOVE BIOGEL PI IND STRL 8 (GLOVE) ×1 IMPLANT
GLOVE BIOGEL PI INDICATOR 8 (GLOVE) ×1
GLOVE ECLIPSE 6.5 STRL STRAW (GLOVE) ×2 IMPLANT
GLOVE INDICATOR 6.5 STRL GRN (GLOVE) ×4 IMPLANT
GLOVE SURG SS PI 8.0 STRL IVOR (GLOVE) ×4 IMPLANT
GOWN PREVENTION PLUS LG XLONG (DISPOSABLE) ×2 IMPLANT
GOWN STRL REIN XL XLG (GOWN DISPOSABLE) ×2 IMPLANT
KIT BASIN OR (CUSTOM PROCEDURE TRAY) ×2 IMPLANT
KIT POSITIONING SURG ANDREWS (MISCELLANEOUS) ×2 IMPLANT
MANIFOLD NEPTUNE II (INSTRUMENTS) ×2 IMPLANT
NDL SPNL 18GX3.5 QUINCKE PK (NEEDLE) ×3 IMPLANT
NEEDLE SPNL 18GX3.5 QUINCKE PK (NEEDLE) ×6 IMPLANT
PATTIES SURGICAL .5 X.5 (GAUZE/BANDAGES/DRESSINGS) IMPLANT
PATTIES SURGICAL .75X.75 (GAUZE/BANDAGES/DRESSINGS) IMPLANT
PATTIES SURGICAL 1X1 (DISPOSABLE) IMPLANT
SPONGE GAUZE 4X4 12PLY (GAUZE/BANDAGES/DRESSINGS) ×1 IMPLANT
SPONGE SURGIFOAM ABS GEL 100 (HEMOSTASIS) ×2 IMPLANT
STAPLER VISISTAT (STAPLE) ×1 IMPLANT
STRIP CLOSURE SKIN 1/2X4 (GAUZE/BANDAGES/DRESSINGS) IMPLANT
SUT PROLENE 3 0 PS 2 (SUTURE) IMPLANT
SUT VIC AB 0 CT1 27 (SUTURE)
SUT VIC AB 0 CT1 27XBRD ANTBC (SUTURE) IMPLANT
SUT VIC AB 1 CT1 27 (SUTURE)
SUT VIC AB 1 CT1 27XBRD ANTBC (SUTURE) ×1 IMPLANT
SUT VIC AB 1-0 CT2 27 (SUTURE) ×2 IMPLANT
SUT VIC AB 2-0 CT1 27 (SUTURE)
SUT VIC AB 2-0 CT1 TAPERPNT 27 (SUTURE) ×1 IMPLANT
SUT VIC AB 2-0 CT2 27 (SUTURE) ×4 IMPLANT
SUT VICRYL 0 27 CT2 27 ABS (SUTURE) ×2 IMPLANT
SUT VICRYL 0 UR6 27IN ABS (SUTURE) IMPLANT
SYRINGE 10CC LL (SYRINGE) ×4 IMPLANT
TAPE CLOTH SURG 4X10 WHT LF (GAUZE/BANDAGES/DRESSINGS) ×1 IMPLANT
TRAY LAMINECTOMY (CUSTOM PROCEDURE TRAY) ×2 IMPLANT
YANKAUER SUCT BULB TIP NO VENT (SUCTIONS) ×2 IMPLANT

## 2011-07-26 NOTE — Anesthesia Postprocedure Evaluation (Signed)
  Anesthesia Post-op Note  Patient: Ryan Holland  Procedure(s) Performed: Procedure(s) (LRB): LUMBAR LAMINECTOMY/DECOMPRESSION MICRODISCECTOMY (Right)  Patient Location: PACU  Anesthesia Type: General  Level of Consciousness: awake and alert   Airway and Oxygen Therapy: Patient Spontanous Breathing  Post-op Pain: mild  Post-op Assessment: Post-op Vital signs reviewed, Patient's Cardiovascular Status Stable, Respiratory Function Stable, Patent Airway and No signs of Nausea or vomiting  Post-op Vital Signs: stable  Complications: No apparent anesthesia complications

## 2011-07-26 NOTE — Transfer of Care (Signed)
Immediate Anesthesia Transfer of Care Note  Patient: Ryan Holland  Procedure(s) Performed: Procedure(s) (LRB): LUMBAR LAMINECTOMY/DECOMPRESSION MICRODISCECTOMY (Right)  Patient Location: PACU  Anesthesia Type: General  Level of Consciousness: awake, alert  and patient cooperative  Airway & Oxygen Therapy: Patient Spontanous Breathing and Patient connected to face mask oxygen  Post-op Assessment: Report given to PACU RN, Post -op Vital signs reviewed and stable, Patient moving all extremities and Patient moving all extremities X 4  Post vital signs: Reviewed and stable  Complications: No apparent anesthesia complications

## 2011-07-26 NOTE — Anesthesia Preprocedure Evaluation (Addendum)
Anesthesia Evaluation  Patient identified by MRN, date of birth, ID band Patient awake    Reviewed: Allergy & Precautions, H&P , NPO status , Patient's Chart, lab work & pertinent test results  Airway Mallampati: II TM Distance: <3 FB Neck ROM: Full    Dental No notable dental hx.    Pulmonary sleep apnea and Continuous Positive Airway Pressure Ventilation , Current Smoker,  breath sounds clear to auscultation  Pulmonary exam normal       Cardiovascular hypertension, Pt. on medications Rhythm:Regular Rate:Normal     Neuro/Psych negative neurological ROS  negative psych ROS   GI/Hepatic negative GI ROS, Neg liver ROS, GERD-  ,  Endo/Other  Morbid obesity  Renal/GU negative Renal ROS  negative genitourinary   Musculoskeletal negative musculoskeletal ROS (+)   Abdominal   Peds negative pediatric ROS (+)  Hematology negative hematology ROS (+)   Anesthesia Other Findings   Reproductive/Obstetrics negative OB ROS                          Anesthesia Physical Anesthesia Plan  ASA: III  Anesthesia Plan: General   Post-op Pain Management:    Induction: Intravenous  Airway Management Planned: Oral ETT  Additional Equipment:   Intra-op Plan:   Post-operative Plan: Extubation in OR  Informed Consent: I have reviewed the patients History and Physical, chart, labs and discussed the procedure including the risks, benefits and alternatives for the proposed anesthesia with the patient or authorized representative who has indicated his/her understanding and acceptance.   Dental advisory given  Plan Discussed with: CRNA  Anesthesia Plan Comments:         Anesthesia Quick Evaluation

## 2011-07-26 NOTE — H&P (Signed)
Ryan Holland is an 41 y.o. male.   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 Hypertension   . GERD (gastroesophageal reflux disease)     OCCAS-WOULD USE OTC MED IF NEEDED  . Anxiety   . HNP (herniated nucleus pulposus), lumbar     PAIN IN LOWER BACK AND PAIN DOWN BOTH LEGS   . Sleep apnea     HAS CPAP-DOES NOT USE-DOES NOT KNOW SETTINGS    Past Surgical History  Procedure Date  . 01/05/2011 lumbar decompression and microdiscectomy   . Cervical fusion  c4-5    . Pilonidal cyst excision     History reviewed. No pertinent family history. Social History:  reports that he has been smoking Cigarettes.  He has a 20 pack-year smoking history. He has never used smokeless tobacco. He reports that he drinks alcohol. He reports that he does not use illicit drugs.  Allergies:  Allergies  Allergen Reactions  . Other     LEMONE-sores in mouth    Medications Prior to Admission  Medication Sig Dispense Refill  . citalopram (CELEXA) 40 MG tablet Take 40 mg by mouth daily with breakfast.      . cyclobenzaprine (FLEXERIL) 10 MG tablet Take 10 mg by mouth 3 (three) times daily as needed. For muscle spasms      . ibuprofen (ADVIL,MOTRIN) 200 MG tablet Take 400-600 mg by mouth every 6 (six) hours as needed. Patient takes 2 to 3 tablets by mouth every 4 hours as needed For pain      . lisinopril-hydrochlorothiazide (PRINZIDE,ZESTORETIC) 10-12.5 MG per tablet Take 1 tablet by mouth daily with breakfast.      . oxyCODONE-acetaminophen (PERCOCET) 7.5-325 MG per tablet Take 1 tablet by mouth every 4 (four) hours as needed. Take 1 tablet by mouth every 4 to 6 hours as needed for pain        Results for orders placed during the hospital encounter of 07/24/11 (from the past 48 hour(s))  CBC     Status: Abnormal   Collection Time   07/24/11 10:55 AM      Component Value Range Comment   WBC 9.1  4.0 - 10.5 (K/uL)    RBC 4.90  4.22 - 5.81 (MIL/uL)    Hemoglobin 16.5  13.0 - 17.0 (g/dL)    HCT 16.1  09.6 - 04.5 (%)    MCV 93.1  78.0 - 100.0 (fL)    MCH 33.7  26.0 - 34.0 (pg)    MCHC 36.2 (*) 30.0 - 36.0 (g/dL)    RDW 40.9  81.1 - 91.4 (%)    Platelets 275  150 - 400 (K/uL)   COMPREHENSIVE METABOLIC PANEL     Status: Abnormal   Collection Time   07/24/11 10:55 AM      Component Value Range Comment   Sodium 135  135 - 145 (mEq/L)    Potassium 4.1  3.5 - 5.1 (mEq/L)    Chloride 99  96 - 112 (mEq/L)    CO2 27  19 - 32 (mEq/L)    Glucose, Bld 104 (*) 70 - 99 (mg/dL)    BUN 12  6 - 23 (mg/dL)    Creatinine, Ser 7.82  0.50 - 1.35 (mg/dL)    Calcium 9.4  8.4 - 10.5 (mg/dL)    Total Protein 7.5  6.0 - 8.3 (g/dL)    Albumin 4.2  3.5 - 5.2 (g/dL)  AST 44 (*) 0 - 37 (U/L)    ALT 57 (*) 0 - 53 (U/L)    Alkaline Phosphatase 48  39 - 117 (U/L)    Total Bilirubin 0.3  0.3 - 1.2 (mg/dL)    GFR calc non Af Amer >90  >90 (mL/min)    GFR calc Af Amer >90  >90 (mL/min)   DIFFERENTIAL     Status: Normal   Collection Time   07/24/11 10:55 AM      Component Value Range Comment   Neutrophils Relative 60  43 - 77 (%)    Neutro Abs 5.5  1.7 - 7.7 (K/uL)    Lymphocytes Relative 27  12 - 46 (%)    Lymphs Abs 2.4  0.7 - 4.0 (K/uL)    Monocytes Relative 8  3 - 12 (%)    Monocytes Absolute 0.8  0.1 - 1.0 (K/uL)    Eosinophils Relative 4  0 - 5 (%)    Eosinophils Absolute 0.4  0.0 - 0.7 (K/uL)    Basophils Relative 1  0 - 1 (%)    Basophils Absolute 0.1  0.0 - 0.1 (K/uL)    Dg Lumbar Spine 2-3 Views  07/24/2011  *RADIOLOGY REPORT*  Clinical Data: Preoperative evaluation prior to lumbar spine surgery for level determination.  LUMBAR SPINE - 2-3 VIEW  Comparison: CT myelogram dated 06/15/2011.  Findings: Five non-rib bearing lumbar vertebrae.  The last open disc space is the L5 S1 level, corresponding to the labeling on the CT myelogram.  Mild to moderate disc space narrowing at multiple levels with multiple small  Schmorl's nodes.  Minimal anterior spur formation at multiple levels.  Mild facet degenerative changes inferiorly.  No pars defects or subluxations seen.  IMPRESSION: Mild degenerative changes, as described above.  Original Report Authenticated By: Darrol Angel, M.D.    Review of Systems  Constitutional: Negative.   Skin: Negative.   Neurological: Positive for sensory change and focal weakness.  All other systems reviewed and are negative.    Blood pressure 136/84, pulse 88, temperature 99.1 F (37.3 C), resp. rate 20, SpO2 100.00%. Physical Exam  Vitals reviewed. Constitutional: He is oriented to person, place, and time. He appears well-developed.  HENT:  Head: Normocephalic.  Eyes: Pupils are equal, round, and reactive to light.  Neck: Normal range of motion.  Cardiovascular: Normal rate.   Respiratory: Effort normal.  GI: Soft.  Musculoskeletal:       +SLR right. Decreased sensation S1 right. Negative SLR left     Neurological: He is alert and oriented to person, place, and time.  Skin: Skin is warm and dry.  Psychiatric: He has a normal mood and affect.     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 Plan Decompression L5S1 right possible L$% left. Risks discussed.  Arlena Marsan C 07/26/2011, 10:51 AM

## 2011-07-26 NOTE — Brief Op Note (Signed)
07/26/2011  12:50 PM  PATIENT:  Ryan Holland  41 y.o. male  PRE-OPERATIVE DIAGNOSIS:  herniated disc lumbar 5 - sacral 1 on right  POST-OPERATIVE DIAGNOSIS:  * No post-op diagnosis entered *  PROCEDURE:  Procedure(s) (LRB): LUMBAR LAMINECTOMY/DECOMPRESSION MICRODISCECTOMY (Right)  SURGEON:  Surgeon(s) and Role:    * Javier Docker, MD - Primary  PHYSICIAN ASSISTANT:   ASSISTANTS: strader   ANESTHESIA:   general  EBL:  Total I/O In: 1000 [I.V.:1000] Out: 400 [Urine:300; Blood:100]  BLOOD ADMINISTERED:none  DRAINS: none   LOCAL MEDICATIONS USED:  MARCAINE     SPECIMEN:  No Specimen  DISPOSITION OF SPECIMEN:  N/A  COUNTS:  YES  TOURNIQUET:  * No tourniquets in log *  DICTATION: .Other Dictation: Dictation Number  [id number]  PLAN OF CARE: Admit to inpatient   PATIENT DISPOSITION:  PACU - hemodynamically stable.   Delay start of Pharmacological VTE agent (>24hrs) due to surgical blood loss or risk of bleeding: yes

## 2011-07-27 ENCOUNTER — Encounter (HOSPITAL_COMMUNITY): Payer: Self-pay | Admitting: Specialist

## 2011-07-27 MED ORDER — HYDROMORPHONE HCL 4 MG PO TABS
4.0000 mg | ORAL_TABLET | ORAL | Status: DC | PRN
  Administered 2011-07-27 – 2011-07-28 (×11): 4 mg via ORAL
  Filled 2011-07-27 (×11): qty 1

## 2011-07-27 MED ORDER — SODIUM CHLORIDE 0.9 % IV SOLN: 2.0000 mg/h | Freq: Once | INTRAVENOUS | Status: DC

## 2011-07-27 MED ORDER — GABAPENTIN 300 MG PO CAPS
300.0000 mg | ORAL_CAPSULE | Freq: Three times a day (TID) | ORAL | Status: DC
  Administered 2011-07-27 – 2011-07-28 (×4): 300 mg via ORAL
  Filled 2011-07-27 (×6): qty 1

## 2011-07-27 MED ORDER — CYCLOBENZAPRINE HCL 10 MG PO TABS
10.0000 mg | ORAL_TABLET | Freq: Three times a day (TID) | ORAL | Status: DC | PRN
  Administered 2011-07-27 – 2011-07-28 (×3): 10 mg via ORAL
  Filled 2011-07-27 (×5): qty 1

## 2011-07-27 MED ORDER — HYDROMORPHONE HCL PF 2 MG/ML IJ SOLN
2.0000 mg | INTRAMUSCULAR | Status: AC
  Administered 2011-07-27: 2 mg via INTRAVENOUS
  Filled 2011-07-27: qty 1

## 2011-07-27 MED ORDER — HYDROMORPHONE HCL 4 MG PO TABS: 4.0000 mg | ORAL_TABLET | ORAL | Status: DC | PRN

## 2011-07-27 MED ORDER — HYDROMORPHONE HCL 2 MG PO TABS
2.0000 mg | ORAL_TABLET | ORAL | Status: DC | PRN
  Administered 2011-07-27 (×5): 2 mg via ORAL
  Filled 2011-07-27 (×5): qty 1

## 2011-07-27 NOTE — Evaluation (Signed)
Physical Therapy Evaluation Patient Details Name: Ryan Holland MRN: [id number] DOB: Jul 20, 1970 Today's Date: 07/27/2011 Time: 4098-1191 PT Time Calculation (min): 16 min  PT Assessment / Plan / Recommendation Clinical Impression  41 y.o. male who is POD #1 for LUMBAR LAMINECTOMY/DECOMPRESSION MICRODISCECTOMY. Pt states this is his 2nd back surgery, first one was November 2012 and he reinjured his back in January 2013 changing a tire. Pt puts forth good effort with mobility but is limited by 10/10 pain. Expect good progress as pain control improves. Will benefit from acute PT to maximize safety and independence with mobility.    PT Assessment  Patient needs continued PT services    Follow Up Recommendations  Home health PT;No PT follow up (HHPT vs. no f/u depending on progress)    Barriers to Discharge None      lEquipment Recommendations  Rolling walker with 5" wheels    Recommendations for Other Services OT consult   Frequency 7X/week    Precautions / Restrictions Precautions Precautions: Back Precaution Comments: instructed pt/wife in back precautions Restrictions Weight Bearing Restrictions: No   Pertinent Vitals/Pain *10/10 back and RLE pain; RN aware, pt premedicated**      Mobility  Bed Mobility Bed Mobility: Sit to Supine Sit to Supine: 4: Min assist Details for Bed Mobility Assistance: VCs for log roll technique Transfers Transfers: Sit to Stand;Stand to Sit Stand to Sit: To bed;5: Supervision;With upper extremity assist Details for Transfer Assistance: movements slow and guarded due to pain Ambulation/Gait Ambulation/Gait Assistance: 4: Min assist Ambulation Distance (Feet): 60 Feet Assistive device: 2 person hand held assist Ambulation/Gait Assistance Details: RW in pt's room was too short, so pt ambulated with HHA of 2. Appropriate height walker was then obtained and placed in pt's room. Gait Pattern: Decreased step length - right;Decreased step length -  left;Antalgic Gait velocity: decreased due to 10/10 pain    Exercises     PT Diagnosis: Difficulty walking;Acute pain  PT Problem List: Decreased strength;Decreased range of motion;Decreased activity tolerance;Decreased mobility;Decreased knowledge of precautions;Decreased knowledge of use of DME;Pain PT Treatment Interventions: DME instruction;Gait training;Stair training;Functional mobility training;Therapeutic activities;Therapeutic exercise;Patient/family education   PT Goals Acute Rehab PT Goals PT Goal Formulation: With patient/family Time For Goal Achievement: 07/31/11 Potential to Achieve Goals: Good Pt will go Supine/Side to Sit: with modified independence PT Goal: Supine/Side to Sit - Progress: Goal set today Pt will go Sit to Supine/Side: with modified independence PT Goal: Sit to Supine/Side - Progress: Goal set today Pt will go Sit to Stand: with modified independence PT Goal: Sit to Stand - Progress: Goal set today Pt will Ambulate: >150 feet;with modified independence;with rolling walker PT Goal: Ambulate - Progress: Goal set today Pt will Go Up / Down Stairs: 3-5 stairs;with rail(s);with supervision PT Goal: Up/Down Stairs - Progress: Goal set today  Visit Information  Last PT Received On: 07/27/11 Assistance Needed: +1    Subjective Data  Subjective: The pain medicine has not provided much relief.  Patient Stated Goal: decrease pain   Prior Functioning  Home Living Lives With: Spouse Available Help at Discharge: Family Type of Home: House Home Access: Stairs to enter Secretary/administrator of Steps: 4 Entrance Stairs-Rails: Can reach both;Left;Right Home Layout: One level Bathroom Shower/Tub: Health visitor: Handicapped height Home Adaptive Equipment: Built-in shower seat;Straight cane Prior Function Level of Independence: Independent with assistive device(s) Able to Take Stairs?: Yes Comments: pt required min A at times to don  shoes/socks Communication Communication: No difficulties  Cognition  Overall Cognitive Status: Appears within functional limits for tasks assessed/performed Arousal/Alertness: Awake/alert Orientation Level: Appears intact for tasks assessed Behavior During Session: [facility] for tasks performed    Extremity/Trunk Assessment Right Upper Extremity Assessment RUE ROM/Strength/Tone: [facility] for tasks assessed Left Upper Extremity Assessment LUE ROM/Strength/Tone: WFL for tasks assessed Right Lower Extremity Assessment RLE ROM/Strength/Tone: Unable to fully assess;Due to pain (ankle DF AROM -15*) RLE Sensation: WFL - Light Touch Left Lower Extremity Assessment LLE ROM/Strength/Tone: Unable to fully assess;Due to pain LLE Sensation: WFL - Light Touch Trunk Assessment Trunk Assessment: Normal   Balance    End of Session PT - End of Session Activity Tolerance: Patient limited by pain Patient left: in bed Nurse Communication: Mobility status   Tamala Ser 07/27/2011, 9:13 AM  [phone]

## 2011-07-27 NOTE — Care Management Note (Signed)
    Page 1 of 2   07/27/2011     4:02:20 PM   CARE MANAGEMENT NOTE 07/27/2011  Patient:  Ryan Holland,Ryan Holland   Account Number:  [id number]  Date Initiated:  07/27/2011  Documentation initiated by:  Lorenda Ishihara  Subjective/Objective Assessment:   41 yo male admitted s/p back surgery. PTA lived at home with spouse.     Action/Plan:   Contact workers comp rep [employer] logistics/support/administrative officer to arranged DME and HH   Anticipated DC Date:  07/28/2011   Anticipated DC Plan:  HOME W HOME HEALTH SERVICES      DC Planning Services  CM consult      [facility] Choice  HOME HEALTH  DURABLE MEDICAL EQUIPMENT   Choice offered to / List presented to:  C-3 Spouse   DME arranged  WALKER - ROLLING        HH arranged  HH-2 PT      Status of service:  Completed, signed off Medicare Important Message given?   (If response is "NO", the following Medicare IM given date fields will be blank) Date Medicare IM given:   Date Additional Medicare IM given:    Discharge Disposition:  HOME W HOME HEALTH SERVICES  Per UR Regulation:  Reviewed for med. necessity/level of care/duration of stay  If discussed at Long Length of Stay Meetings, dates discussed:    Comments:  07-27-11 Lorenda Ishihara RN CM 1600 Spoke with patient and wife in room. PT recommenations for [facility] PT depending on progress. OT recommendation for no further f/u. Wife and patient agreeable to [facility] services, offered choice and they told me who ever the worker's comp rep would recommend. Spoke with [employer], worker's comp rep. She will arrange [facility] PT and RW thru [facility]. She will have rolling walker delivered to patient in his room prior to d/c. Awaiting orders from Dr. Shelle Iron so can fax orders to her.

## 2011-07-27 NOTE — Progress Notes (Signed)
UR complete 

## 2011-07-27 NOTE — Op Note (Signed)
NAMEGEOVANI, Ryan Holland NO.:  [id number]  MEDICAL RECORD NO.:  [id number]  LOCATION:  1529                         FACILITY:  [facility]  PHYSICIAN:  Jene Every, M.D.    DATE OF BIRTH:  1971-03-03  DATE OF PROCEDURE:  07/26/2011 DATE OF DISCHARGE:                              OPERATIVE REPORT   PREOPERATIVE DIAGNOSIS:  Recurrent disk herniation at  DICTATION ENDS HERE.     Jene Every, M.D.     Cordelia Pen  D:  07/26/2011  T:  07/27/2011  Job:  [id number]

## 2011-07-27 NOTE — Progress Notes (Signed)
Physical Therapy Treatment Patient Details Name: Ryan Holland MRN: [id number] DOB: 1970-06-09 Today's Date: 07/27/2011 Time: 4098-1191 PT Time Calculation (min): 17 min  PT Assessment / Plan / Recommendation Comments on Treatment Session  Pt reporting improved pain control, tolerated increased gait distance, is now modified independenct with mobility. Expect will be ready to DC home tomorrow morning. Encouraged pt to walk in halls independently with RW.    Follow Up Recommendations  No PT follow up (HHPT vs. no f/u depending on progress)    Barriers to Discharge None      Equipment Recommendations  Rolling walker with 5" wheels    Recommendations for Other Services OT consult  Frequency 7X/week   Plan Discharge plan remains appropriate    Precautions / Restrictions Precautions Precautions: Back Precaution Comments: reviewed back precautions and log roll technique Restrictions Weight Bearing Restrictions: No   Pertinent Vitals/Pain *5-6/10 in back and RLE; pt premedicated**    Mobility  Bed Mobility Bed Mobility: Right Sidelying to Sit;Rolling Left;Left Sidelying to Sit Rolling Left: 6: Modified independent (Device/Increase time);With rail Left Sidelying to Sit: 6: Modified independent (Device/Increase time);With rails;HOB flat Supine to Sit: 5: Supervision Sit to Supine: 5: Supervision;Other (comment) (increased time) Sit to Sidelying Right: 6: Modified independent (Device/Increase time);With rail;HOB flat Details for Bed Mobility Assistance: pt shows good technique with log roll Transfers Transfers: Sit to Stand;Stand to Sit Sit to Stand: 6: Modified independent (Device/Increase time);From bed;With upper extremity assist Stand to Sit: 6: Modified independent (Device/Increase time);To bed;With upper extremity assist Details for Transfer Assistance: Pt moves slow and guarded due to pain, benefits from increased time Ambulation/Gait Ambulation/Gait Assistance: 6:  Modified independent (Device/Increase time) Ambulation Distance (Feet): 300 Feet Assistive device: Rolling walker Ambulation/Gait Assistance Details: Much improved tolerance of ambulation, decreased pain as compared to PT eval this morning. Gait Pattern: Within Functional Limits Gait velocity: decreased due to 10/10 pain    Exercises     PT Diagnosis: Difficulty walking;Acute pain  PT Problem List: Decreased strength;Decreased range of motion;Decreased activity tolerance;Decreased mobility;Decreased knowledge of precautions;Decreased knowledge of use of DME;Pain PT Treatment Interventions: DME instruction;Gait training;Stair training;Functional mobility training;Therapeutic activities;Therapeutic exercise;Patient/family education   PT Goals Acute Rehab PT Goals PT Goal Formulation: With patient/family Time For Goal Achievement: 07/31/11 Potential to Achieve Goals: Good Pt will go Supine/Side to Sit: with modified independence PT Goal: Supine/Side to Sit - Progress: Met Pt will go Sit to Supine/Side: with modified independence PT Goal: Sit to Supine/Side - Progress: Goal set today Pt will go Sit to Stand: with modified independence PT Goal: Sit to Stand - Progress: Met Pt will Ambulate: >150 feet;with modified independence;with rolling walker PT Goal: Ambulate - Progress: Met Pt will Go Up / Down Stairs: 3-5 stairs;with rail(s);with supervision PT Goal: Up/Down Stairs - Progress: Not met  Visit Information  Last PT Received On: 07/27/11 Assistance Needed: +1    Subjective Data  Subjective: Pt reports improvement with pain control. Patient Stated Goal: to get a nap   Cognition  Overall Cognitive Status: Appears within functional limits for tasks assessed/performed Arousal/Alertness: Awake/alert Orientation Level: Appears intact for tasks assessed Behavior During Session: [facility] for tasks performed    Balance     End of Session PT - End of Session Activity Tolerance: Patient  tolerated treatment well Patient left: in bed Nurse Communication: Mobility status    Tamala Ser 07/27/2011, 12:18 PM [phone]

## 2011-07-27 NOTE — Evaluation (Signed)
Occupational Therapy Evaluation Patient Details Name: Ryan Holland MRN: [id number] DOB: June 28, 1970 Today's Date: 07/27/2011 Time: 0940-1001    OT Assessment / Plan / Recommendation Clinical Impression  Pt appears to be limited by pain. He was premedicated & MD/RN are aware. He is overall Min A LB ADL's and supervision-min guard assist for transfers w/ VC's/back precautions during self care tasks. He has A/E and supportive wife to assist at d/c. Will follow acutely for OT    OT Assessment  Patient needs continued OT Services    Follow Up Recommendations  No OT follow up          Equipment Recommendations  None recommended by OT    Recommendations for Other Services    Frequency  Min 2X/week    Precautions / Restrictions Precautions Precautions: Back Precaution Comments: instructed pt/wife in back precautions Restrictions Weight Bearing Restrictions: No   Pertinent Vitals/Pain 10/10 Pt was premedicated, RN & MD aware    ADL  Eating/Feeding: Simulated;Modified independent Where Assessed - Eating/Feeding: Bed level Grooming: Performed;Wash/dry hands;Supervision/safety Where Assessed - Grooming: Unsupported standing;Other (comment) (standing at sink in bathroom) Upper Body Bathing: Simulated;Supervision/safety Where Assessed - Upper Body Bathing: Unsupported sitting;Unsupported standing Lower Body Bathing: Simulated;Minimal assistance Where Assessed - Lower Body Bathing: Unsupported sitting;Unsupported standing Upper Body Dressing: Simulated;Min guard (secondary to back precautions) Where Assessed - Upper Body Dressing: Unsupported standing;Unsupported sitting Lower Body Dressing: Performed;Minimal assistance (don/doff socks) Where Assessed - Lower Body Dressing: Supine, head of bed flat Toilet Transfer: Performed;Supervision/safety Toilet Transfer Method: Sit to stand;Other (comment) (Ambulated w/ RW Supervision level, limited by pain) Toilet Transfer Equipment: Raised  toilet seat with arms (or 3-in-1 over toilet);Other (comment) (Pt sit/stand toilet w/ 1 hand in middle of RW) Toileting - Clothing Manipulation and Hygiene: Performed;Modified independent;Supervision/safety Where Assessed - Toileting Clothing Manipulation and Hygiene: Standing Tub/Shower Transfer: Simulated;Min guard Tub/Shower Transfer Method: Ambulating;Other (comment) (w/ RW, VC's for safety) Tub/Shower Transfer Equipment: Walk in shower Equipment Used: Rolling walker Transfers/Ambulation Related to ADLs: Pt was overall VC and supervision level  using RW with occasional Min guard assist for simulated walk in shower transfer & Min A LB ADL's/Self care. ADL Comments: Pt appears to be limited by pain. He was premedicated & MD/RN are aware. He is overall Min A LB ADL's and supervision-min guard assist for transfers w/ VC's/back precautions during self care tasks. He has A/E and supportive wife to assist at d/c. Will follow acutely for OT.    OT Diagnosis: Acute pain  OT Problem List: Decreased activity tolerance;Decreased knowledge of use of DME or AE;Decreased knowledge of precautions;Pain OT Treatment Interventions: Self-care/ADL training;Therapeutic activities;DME and/or AE instruction;Patient/family education   OT Goals Acute Rehab OT Goals OT Goal Formulation: With patient Potential to Achieve Goals: Good ADL Goals Pt Will Perform Grooming: with modified independence;Standing at sink ADL Goal: Grooming - Progress: Goal set today Pt Will Perform Lower Body Dressing: with min assist;Sitting, chair;Sit to stand from chair;with adaptive equipment ADL Goal: Lower Body Dressing - Progress: Goal set today Pt Will Transfer to Toilet: with modified independence;with DME;Maintaining back safety precautions;Ambulation ADL Goal: Toilet Transfer - Progress: Goal set today Pt Will Perform Toileting - Clothing Manipulation: with modified independence;Standing;Sitting on 3-in-1 or toilet ADL Goal:  Toileting - Clothing Manipulation - Progress: Goal set today Pt Will Perform Toileting - Hygiene: with modified independence;Sit to stand from 3-in-1/toilet;Standing at 3-in-1/toilet;with adaptive equipment ADL Goal: Toileting - Hygiene - Progress: Goal set today Pt Will Perform Tub/Shower Transfer: with supervision;Ambulation;Anterior-posterior transfer;Maintaining back  safety precautions ADL Goal: Tub/Shower Transfer - Progress: Goal set today  Visit Information  Last OT Received On: 07/27/11 Assistance Needed: +1    Subjective Data  Subjective: I had pain medicine about 9:15-9:20   Prior Functioning  Home Living Lives With: Spouse Available Help at Discharge: Family Type of Home: House Home Access: Stairs to enter Secretary/administrator of Steps: 4 Entrance Stairs-Rails: Can reach both;Left;Right Home Layout: One level Bathroom Shower/Tub: Health visitor: Handicapped height Home Adaptive Equipment: Built-in shower seat;Straight cane;Grab bars in shower;Other (comment);Reacher;Sock aid (toilet riser) Additional Comments: Discussed hand held shower w/ pt/pt's wife Prior Function Level of Independence: Independent with assistive device(s) Able to Take Stairs?: Yes Comments: Pt & his wife report that pt required min A at times to don shoes/socks prior to this surgery (has LH reacher and sock aid) Communication Communication: No difficulties Dominant Hand: Left    Cognition  Overall Cognitive Status: Appears within functional limits for tasks assessed/performed Arousal/Alertness: Awake/alert Orientation Level: Appears intact for tasks assessed Behavior During Session: [facility] for tasks performed    Extremity/Trunk Assessment Right Upper Extremity Assessment RUE ROM/Strength/Tone: Within functional levels RUE Sensation: WFL - Light Touch Left Upper Extremity Assessment LUE ROM/Strength/Tone: Within functional levels LUE Sensation: WFL - Light Touch Right Lower  Extremity Assessment RLE ROM/Strength/Tone: Unable to fully assess;Due to pain (ankle DF AROM -15*) RLE Sensation: WFL - Light Touch Left Lower Extremity Assessment LLE ROM/Strength/Tone: Unable to fully assess;Due to pain LLE Sensation: WFL - Light Touch Trunk Assessment Trunk Assessment: Normal   Mobility Bed Mobility Bed Mobility: Sit to Supine Supine to Sit: 5: Supervision Sit to Supine: 5: Supervision;Other (comment) (increased time) Sit to Sidelying Right: 5: Supervision;Other (comment) (increased time) Details for Bed Mobility Assistance: Pt was observed x2 to be Mod I log roll tech requiring increased time to perform. Transfers Transfers: Sit to Stand Sit to Stand: 5: Supervision;Without upper extremity assist;From bed;From chair/3-in-1 Stand to Sit: 5: Supervision;Without upper extremity assist;To chair/3-in-1;To bed Details for Transfer Assistance: Pt moves slow and guarded due to pain, benefits from increased time           End of Session OT - End of Session Equipment Utilized During Treatment: Other (comment) (RW) Activity Tolerance: Patient limited by pain Patient left: in bed;with call bell/phone within reach;with family/visitor present;Other (comment) (MD in room)   Mariam Dollar [facility] 07/27/2011, 10:23 AM

## 2011-07-27 NOTE — Op Note (Signed)
NAMEGAMAL, TODISCO NO.:  [id number]  MEDICAL RECORD NO.:  [id number]  LOCATION:  1529                         FACILITY:  [facility]  PHYSICIAN:  Jene Every, M.D.    DATE OF BIRTH:  [birth year]  DATE OF PROCEDURE:  07/26/2011 DATE OF DISCHARGE:                              OPERATIVE REPORT   PREOPERATIVE DIAGNOSIS:  Spinal stenosis, recurrent disk herniation, L5- S1 right.  POSTOPERATIVE DIAGNOSIS:  Spinal stenosis, recurrent disk herniation, L5- S1 right with stenosis of foramen L5 and S1 with severe lateral recess compressing the S1 nerve root.  PROCEDURE PERFORMED:  Redo lumbar decompression, L5-S1 with foraminotomies of L5 and S1, decompression of lateral recess, lysis of epidural venous plexus and hemilaminotomy.  ANESTHESIA:  General.  ASSISTANT:  Roma Schanz, P.A.  HISTORY:  This 41 year old male who was history of lumbar decompression, 4-5 and 5-1 done well postoperatively.  He turned to work and tried to lift a heavy truck tired which reinjured his back, generating recurrent disk herniation, L5-S1 on the right.  He had a central disk at 4-5 that was not compressing the roots.  He had some mild lateral recess stenosis and epidural fibrosis.  We had a myelogram which demonstrated just minimal displacement of the 5 root on the left and then displacement of the S1 root on the right.  He had predominantly right-sided symptoms, developing more left-sided symptoms, I felt it was tethering effect from the right.  We discussed this preoperatively.  I indicated that if there was severe stenosis on the right, tethering the root of 5 and 1 I would most likely perform at single level only, as to performing a redo adjacent level without __________ severe compression would only prompt further collapse of the disk space.  So we discussed the risks and benefits of bleeding, infection, damage to vascular structures, no change in symptoms, worsening symptoms,  need for repeat decompression, DVT, PE and anesthetic complications, etcetera.  TECHNIQUE:  The patient was placed in supine position.  After induction of adequate general anesthesia, 2 grams Kefzol, placed prone on the [place] frame.  All bony prominences were well padded.  Lumbar region was prepped and draped in usual sterile fashion.  A 22-gauge spinal needle was utilized to localize the 5-1 interspace on the right. Incision was made from spinous process of L5-S1.  Subcutaneous tissue was dissected and electrocautery used to achieve hemostasis. Dorsolumbar fascia identified and divided in line with the skin incision.  Paraspinous muscle elevated from lamina 5 and 1.  The pelvic retractor was placed.  Operating microscope was draped and brought onto the surgical field.  We then obtained a confirmatory radiograph in the interlaminar space.  Extensive epidural fibrosis was noted.  We skeletonized the lamina of 5, the previous laminotomy of S1, and the facet as well.  We used a 2-0 Michael curette to meticulously create a plane between the thecal sac, the S1 nerve root and the lamina of S1 as well 5.  I then performed a generous foraminotomy of S1 utilizing 2 and 3 mm Kerrison.  I identified the S1 nerve root and gently mobilized it. Attempted to mobilize it medially, it was compressed into the lateral  recess.  I then decompressed the lateral recess to medial border of the pedicle.  I was able then to mobilize the S1 nerve root further medially.  He had a small bony portion of the facet that was compressing the shoulder of the root.  This was removed as well.  We then removed the ligamentum flavum from the caudad edge of 5, enlarged the laminotomy of 5 preserving the pars.  We performed a foraminotomy of 5.  There was stenosis in the foramen of 5.  There was scar tissue and ligamentum flavum infolding and a severely compressed lateral recess.  This was removed and freed the S1 nerve root.   It was tethered with an epidural venous plexus and this bipolared and lysed and gently mobilized medially.  It had a hardened osteophytic anulus.  There was small disk material that was removed outside of the disk space.  We confirmed the space.  We had 1 cm excursion of the S1 nerve root and pedicle off the decompression with a probe passed freely up the foramen L5-S1.  It was then cephalad to above the pedicle of 5, was fairly free.  The thecal sac in the S1 nerve root was tethered over laterally secondary to this severe compression.  Once this was released the thecal sac was untethered.  I feel this is partly responsible for the contralateral symptoms in terms of tethering the root.  I further felt that the redo decompression 4-5 would not predictively afford a long-term benefit.  I copiously irrigated the space and inspection revealed no CSF leakage or active bleeding.  I removed the McCullough retractor.  Paraspinous muscles were inspected, no evidence of active bleeding.  Dorsolumbar fascia reapproximated with 1 Vicryl in a figure-of-eight sutures, subcu with 2-0, skin approximated with staples due to the redo.  It was dressed sterilely.  Placed supine on the hospital bed, extubated without difficulty, transported to the recovery room in satisfactory condition.  The patient tolerated procedure well.  No complications.  Minimal blood loss.     Jene Every, M.D.     Cordelia Pen  D:  07/26/2011  T:  07/27/2011  Job:  [id number]

## 2011-07-27 NOTE — Progress Notes (Signed)
Subjective: 1 Day Post-Op Procedure(s) (LRB): LUMBAR LAMINECTOMY/DECOMPRESSION MICRODISCECTOMY (Right) Patient reports pain as 7 on 0-10 scale.   Denies CP or SOB.  Voiding without difficulty. Positive flatus. Objective: Vital signs in last 24 hours: Temp:  [97.3 F (36.3 C)-98.6 F (37 C)] 98.4 F (36.9 C) (05/23 0615) Pulse Rate:  [75-104] 75  (05/23 0615) Resp:  [11-20] 18  (05/23 0615) BP: (105-140)/(58-79) 122/78 mmHg (05/23 0615) SpO2:  [95 %-100 %] 99 % (05/23 0615)  Intake/Output from previous day: 05/22 0701 - 05/23 0700 In: 3480 [P.O.:720; I.V.:2500; IV Piggyback:260] Out: 1800 [Urine:1700; Blood:100] Intake/Output this shift:     Basename 07/24/11 1055  HGB 16.5    Basename 07/24/11 1055  WBC 9.1  RBC 4.90  HCT 45.6  PLT 275    Basename 07/24/11 1055  NA 135  K 4.1  CL 99  CO2 27  BUN 12  CREATININE 0.86  GLUCOSE 104*  CALCIUM 9.4   No results found for this basename: LABPT:2,INR:2 in the last 72 hours  Neurologically intact Neurovascular intact Sensation intact distally Intact pulses distally Dorsiflexion/Plantar flexion intact Compartment soft perineal sensation intact.  Voiding well.  Assessment/Plan: 1 Day Post-Op Procedure(s) (LRB): LUMBAR LAMINECTOMY/DECOMPRESSION MICRODISCECTOMY (Right) Advance diet Plan for discharge tomorrow. Increase pain meds. Add neurontin. Motrin at home. Was on Percocet 7.5mg  q2hours at home. May start dosepack.  Dannika Hilgeman C 07/27/2011, 10:29 AM

## 2011-07-28 MED ORDER — GABAPENTIN 300 MG PO CAPS: 300.0000 mg | ORAL_CAPSULE | Freq: Three times a day (TID) | ORAL | Status: DC

## 2011-07-28 MED ORDER — CYCLOBENZAPRINE HCL 10 MG PO TABS: 10.0000 mg | ORAL_TABLET | Freq: Three times a day (TID) | ORAL | Status: DC | PRN

## 2011-07-28 MED ORDER — HYDROMORPHONE HCL 4 MG PO TABS: ORAL_TABLET | ORAL | Status: DC

## 2011-07-28 NOTE — Progress Notes (Signed)
Physical Therapy Treatment Patient Details Name: Ryan Holland MRN: [id number] DOB: 11-Dec-1970 Today's Date: 07/28/2011 Time: 0930-0950 PT Time Calculation (min): 20 min  PT Assessment / Plan / Recommendation Comments on Treatment Session  Pt plans to D/C to home today with spouse.  Practiced up/down 4 steps using one rail.  Pt given back booklet.  Pt instructed on use of ICE and side lying sleeping positioning.  This is pt's secong back surgery, so very knowledgable.  Pt waiting for a RW to be delivered before he can D/c to home.    Follow Up Recommendations  No PT follow up;Other (comment) (pt agreed)    Barriers to Discharge        Equipment Recommendations  Rolling walker with 5" wheels    Recommendations for Other Services    Frequency 7X/week   Plan Discharge plan remains appropriate    Precautions / Restrictions Precautions Precautions: Back Precaution Booklet Issued: Yes (comment) Precaution Comments: Pt given back booklet and is aware of his back percautions Restrictions Weight Bearing Restrictions: No   Pertinent Vitals/Pain C/o 2/10 back pain, offered ICE    Mobility  Bed Mobility Details for Bed Mobility Assistance: Pt OOB in recliner Transfers Transfers: Sit to Stand;Stand to Sit Sit to Stand: 6: Modified independent (Device/Increase time);With armrests;Without upper extremity assist;From chair/3-in-1 Stand to Sit: 6: Modified independent (Device/Increase time);With armrests;Without upper extremity assist;To chair/3-in-1 Details for Transfer Assistance: good safety technique and speed Ambulation/Gait Ambulation/Gait Assistance: 6: Modified independent (Device/Increase time) Ambulation Distance (Feet): 450 Feet Assistive device: Rolling walker Ambulation/Gait Assistance Details: Feeling much better, no c/o pain radiating. Gait Pattern: Step-through pattern Stairs: Yes Stairs Assistance: 5: Supervision Stairs Assistance Details (indicate cue type and  reason): alternating steps Stair Management Technique: One rail Left Number of Stairs: 4  Wheelchair Mobility Wheelchair Mobility: No       Visit Information  Last PT Received On: 07/28/11 Assistance Needed: +1       Cognition    good   Balance   good  End of Session PT - End of Session Equipment Utilized During Treatment: Gait belt Activity Tolerance: Patient tolerated treatment well Patient left: in chair;with call bell/phone within reach;with nursing in room;with family/visitor present    Felecia Shelling  PTA [facility]  Acute  Rehab Pager     [phone]

## 2011-07-28 NOTE — Discharge Summary (Signed)
Patient ID: Ryan Holland MRN: [id number] DOB/AGE: 1970/09/22 41 y.o.  Admit date: 07/26/2011 Discharge date: 07/28/2011  Admission Diagnoses:  Recurrent HNP Past Medical History  Diagnosis Date  . Hypertension   . GERD (gastroesophageal reflux disease)     OCCAS-WOULD USE OTC MED IF NEEDED  . Anxiety   . HNP (herniated nucleus pulposus), lumbar     PAIN IN LOWER BACK AND PAIN DOWN BOTH LEGS   . Sleep apnea     HAS CPAP-DOES NOT USE-DOES NOT KNOW SETTINGS   Discharge Diagnoses:  Same   Surgeries: Procedure(s): LUMBAR LAMINECTOMY/DECOMPRESSION MICRODISCECTOMY on 07/26/2011    Discharged Condition: Improved  Hospital Course: Ryan Holland is an 41 y.o. male who was admitted 07/26/2011 for operative treatment of recurrent HNP. Patient has severe unremitting pain that affects sleep, daily activities, and work/hobbies. After pre-op clearance the patient was taken to the operating room on 07/26/2011 and underwent  Procedure(s): LUMBAR LAMINECTOMY/DECOMPRESSION MICRODISCECTOMY.    Patient was given perioperative antibiotics: Anti-infectives     Start     Dose/Rate Route Frequency Ordered Stop   07/26/11 1600   ceFAZolin (ANCEF) IVPB 2 g/50 mL premix        2 g 100 mL/hr over 30 Minutes Intravenous Every 8 hours 07/26/11 1454 07/27/11 0134   07/26/11 1157   polymyxin B 500,000 Units, bacitracin 50,000 Units in sodium chloride irrigation 0.9 % 500 mL irrigation  Status:  Discontinued          As needed 07/26/11 1157 07/26/11 1304   07/26/11 0847   ceFAZolin (ANCEF) IVPB 2 g/50 mL premix        2 g 100 mL/hr over 30 Minutes Intravenous 60 min pre-op 07/26/11 0847 07/26/11 1114           Patient was given sequential compression devices, early ambulation, and chemoprophylaxis to prevent DVT.  Patient benefited maximally from hospital stay and there were no complications. He did initially have some pain control issues that were addressed.  POD #2 he was much improved and felt  ready for discharge.   Recent vital signs: Patient Vitals for the past 24 hrs:  BP Temp Temp src Pulse Resp SpO2  07/28/11 0622 96/62 mmHg 98.3 F (36.8 C) Oral 89  20  97 %  07/28/11 0210 113/76 mmHg 97.8 F (36.6 C) Oral 93  20  97 %  07/27/11 2235 116/74 mmHg 98.5 F (36.9 C) Oral 97  20  -  07/27/11 1800 131/77 mmHg 98.5 F (36.9 C) Oral 93  19  98 %     Recent laboratory studies: No results found for this basename: WBC:2,HGB:2,HCT:2,PLT:2,NA:2,K:2,CL:2,CO2:2,BUN:2,CREATININE:2,GLUCOSE:2,PT:2,INR:2,CALCIUM,2: in the last 72 hours   Discharge Medications:   Medication List  As of 07/28/2011  3:38 PM   STOP taking these medications         oxyCODONE-acetaminophen 7.5-325 MG per tablet         TAKE these medications         citalopram 40 MG tablet   Commonly known as: CELEXA   Take 40 mg by mouth daily with breakfast.      cyclobenzaprine 10 MG tablet   Commonly known as: FLEXERIL   Take 1 tablet (10 mg total) by mouth 3 (three) times daily as needed. For muscle spasms      gabapentin 300 MG capsule   Commonly known as: NEURONTIN   Take 1 capsule (300 mg total) by mouth 3 (three) times daily.      HYDROmorphone 4  MG tablet   Commonly known as: DILAUDID   1-2 tabs 3 hours prn pain      ibuprofen 200 MG tablet   Commonly known as: ADVIL,MOTRIN   Take 400-600 mg by mouth every 6 (six) hours as needed. Patient takes 2 to 3 tablets by mouth every 4 hours as needed For pain      lisinopril-hydrochlorothiazide 10-12.5 MG per tablet   Commonly known as: PRINZIDE,ZESTORETIC   Take 1 tablet by mouth daily with breakfast.            Diagnostic Studies: Dg Lumbar Spine 2-3 Views  07/24/2011  *RADIOLOGY REPORT*  Clinical Data: Preoperative evaluation prior to lumbar spine surgery for level determination.  LUMBAR SPINE - 2-3 VIEW  Comparison: CT myelogram dated 06/15/2011.  Findings: Five non-rib bearing lumbar vertebrae.  The last open disc space is the L5 S1 level,  corresponding to the labeling on the CT myelogram.  Mild to moderate disc space narrowing at multiple levels with multiple small Schmorl's nodes.  Minimal anterior spur formation at multiple levels.  Mild facet degenerative changes inferiorly.  No pars defects or subluxations seen.  IMPRESSION: Mild degenerative changes, as described above.  Original Report Authenticated By: Darrol Angel, M.D.   Dg Spine Portable 1 View  07/26/2011  *RADIOLOGY REPORT*  Clinical Data: Surgical level L5-S1, possible L4-L5.  PORTABLE SPINE - 1 VIEW  Comparison: Intraoperative radiographs same date.  Findings: Cross-table lateral view labeled #3 at 1230 hours.  Skin spreaders are unchanged overlapping the upper sacrum.  There are blunt surgical instruments directed towards the posterior aspect of the L5-S1 disc and the L5 pedicles.  IMPRESSION: Intraoperative localization as described.  Original Report Authenticated By: Gerrianne Scale, M.D.   Dg Spine Portable 1 View  07/26/2011  *RADIOLOGY REPORT*  Clinical Data: Surgical level L5-S1, possible L4-L5.  PORTABLE SPINE - 1 VIEW  Comparison: Earlier same day.  Findings: Cross-table lateral view labeled #2 at 1141 hours.  There are skin spreaders posteriorly at L5-S1.  A blunt surgical instrument is directed towards the posterior aspect of the upper sacrum.  IMPRESSION: Intraoperative localization as described.  Original Report Authenticated By: Gerrianne Scale, M.D.   Dg Spine Portable 1 View  07/26/2011  *RADIOLOGY REPORT*  Clinical Data: Surgical level L5-S1, possible L4-L5.  PORTABLE SPINE - 1 VIEW  Comparison: Lumbar spine radiographs 07/24/2011.  Myelogram CT 06/15/2011.  Findings: Cross-table lateral view of the lumbar spine labeled #1 at 1128 hours.  There are posterior localizing needles posterior to the L3 spinous process and L5 spinous process.  IMPRESSION: Intraoperative localization as described.  Original Report Authenticated By: Gerrianne Scale, M.D.     Disposition: 01-Home or Self Care  Discharge Orders    Future Orders Please Complete By Expires   Diet - low sodium heart healthy      Call MD / Call 911      Comments:   If you experience chest pain or shortness of breath, CALL 911 and be transported to the hospital emergency room.  If you develope a fever above 101 F, pus (white drainage) or increased drainage or redness at the wound, or calf pain, call your surgeon's office.   Constipation Prevention      Comments:   Drink plenty of fluids.  Prune juice may be helpful.  You may use a stool softener, such as Colace (over the counter) 100 mg twice a day.  Use MiraLax (over the counter) for constipation as  needed.   Increase activity slowly as tolerated      Discharge instructions      Comments:   Walk As Tolerated utilizing back precautions.  No bending, twisting, or lifting.  No driving for 2 weeks.  Ok to shower in 72 hours.  Use good body mechanics. Change dressing daily.      Follow-up Information    Follow up with BEANE,JEFFREY C, MD in 14 days.   Contact information:   [facility] [address] [place] [address] [phone]           Signed: Liam Graham. 07/28/2011, 3:38 PM

## 2011-07-28 NOTE — Progress Notes (Signed)
Spoke with Wynona Canes, [place] patient is ok to be d/c'd home without HHPT or outpt PT. Patient to f/u with MD as instructed and then begin Outpt PT later.

## 2011-07-28 NOTE — Progress Notes (Signed)
Spoke with patient, much improved today. No longer feels need for [facility] services, plans to f/u with OP services at previous facility. RN spoke with provider and agrees with plan. Walker to be delivered today by [facility], [facility] notified.

## 2011-10-18 ENCOUNTER — Emergency Department (HOSPITAL_COMMUNITY)
Admission: EM | Admit: 2011-10-18 | Discharge: 2011-10-19 | Disposition: A | Discharge status: Home / Self Care | Payer: Worker's Compensation | Attending: Emergency Medicine | Admitting: Emergency Medicine

## 2011-10-18 ENCOUNTER — Encounter (HOSPITAL_COMMUNITY): Payer: Self-pay | Admitting: *Deleted

## 2011-10-18 DIAGNOSIS — R279 Unspecified lack of coordination: Secondary | ICD-10-CM | POA: Insufficient documentation

## 2011-10-18 DIAGNOSIS — G4730 Sleep apnea, unspecified: Secondary | ICD-10-CM | POA: Insufficient documentation

## 2011-10-18 DIAGNOSIS — F17200 Nicotine dependence, unspecified, uncomplicated: Secondary | ICD-10-CM | POA: Insufficient documentation

## 2011-10-18 DIAGNOSIS — G2401 Drug induced subacute dyskinesia: Secondary | ICD-10-CM

## 2011-10-18 DIAGNOSIS — K219 Gastro-esophageal reflux disease without esophagitis: Secondary | ICD-10-CM | POA: Insufficient documentation

## 2011-10-18 DIAGNOSIS — I10 Essential (primary) hypertension: Secondary | ICD-10-CM | POA: Insufficient documentation

## 2011-10-18 NOTE — ED Notes (Signed)
[id number]<AV> Expected date:<BR> Expected time:<BR> Means of arrival:<BR> Comments:<BR> EMS/IV

## 2011-10-18 NOTE — ED Notes (Signed)
Called for pt in [facility] x 1 no answer

## 2011-10-18 NOTE — ED Notes (Signed)
Per EMS report, Pt from home: Recently had neck and back surgery and medications have been changed around recently. Pt reports seizure-like activity in his extremities since Sunday. En route EMS reports leg twitching stopped when EMS stopped paying attention to him.  Hx HTN. IV: 20g left hand, BP:180/100, HR: 96 SR, RR: 14, 97%

## 2011-10-19 LAB — BASIC METABOLIC PANEL
BUN: 19 mg/dL (ref 6–23)
Chloride: 99 mEq/L (ref 96–112)
GFR calc Af Amer: >90 mL/min (ref >90)
Glucose, Bld: 100 mg/dL — ABNORMAL HIGH (ref 70–99)
Potassium: 3.8 mEq/L (ref 3.5–5.1)
Sodium: 134 mEq/L — ABNORMAL LOW (ref 135–145)

## 2011-10-19 LAB — CBC WITH DIFFERENTIAL/PLATELET
Basophils Relative: 0 % (ref 0–1)
Hemoglobin: 15.4 g/dL (ref 13.0–17.0)
Lymphs Abs: 3.7 10*3/uL (ref 0.7–4.0)
Monocytes Relative: 5 % (ref 3–12)
Neutro Abs: 8.6 10*3/uL — ABNORMAL HIGH (ref 1.7–7.7)
Neutrophils Relative %: 64 % (ref 43–77)
RBC: 4.54 MIL/uL (ref 4.22–5.81)

## 2011-10-19 NOTE — ED Provider Notes (Signed)
Medical screening examination/treatment/procedure(s) were performed by non-physician practitioner and as supervising physician I was immediately available for consultation/collaboration.   Lyanne Co, MD 10/19/11 [phone]

## 2011-10-19 NOTE — ED Provider Notes (Signed)
History     CSN: [id number]  Arrival date & time 10/18/11  2240   First MD Initiated Contact with Patient 10/19/11 0047      Chief Complaint  Patient presents with  . Muscle spasms    HPI  History provided by the patient and family. Patient is a 41 year old male with history of hypertension, GERD, anxiety and cervical and lumbar surgeries who presents with complaints of concerns for muscle tremors and shakes. Patient reports that symptoms began over the past several days. Patient states symptoms seem to be gone after a reduction in his gabapentin medications. Patient was initially taking 600 mg 3 times a day but this was reduced to twice a day and then daily. Patient was seen by Dr. Pricilla Handler orthopedics for his symptoms of shaking and his dose was increased back to twice a day patient states that he has been having episodic twitching and contractions of his upper and lower extremities. Symptoms initially seemed worse on the right lower leg. Patient had persistent and continuous symptoms all day Sunday when his Neurontin was decreased to the lowest dose. Patient rested and did nothing on Monday and states symptoms improved some. This evening patient was at home standing and had her recurrence of symptoms. Patient's brother was there at the time and noted that patient was conscious and speaking but that his pupils seemed reactive to light faster flash light pass them. Patient denies having any other complaints or symptoms. He denies feeling any change in sensations in the arms or legs. He denies any urinary or fecal incontinence, urinary retention or perineal numbness. He denies any fever or URI symptoms   Past Medical History  Diagnosis Date  . Hypertension   . GERD (gastroesophageal reflux disease)     OCCAS-WOULD USE OTC MED IF NEEDED  . Anxiety   . HNP (herniated nucleus pulposus), lumbar     PAIN IN LOWER BACK AND PAIN DOWN BOTH LEGS   . Sleep apnea     HAS CPAP-DOES NOT USE-DOES  NOT KNOW SETTINGS    Past Surgical History  Procedure Date  . 01/05/2011 lumbar decompression and microdiscectomy   . Cervical fusion  c4-5    . Pilonidal cyst excision   . Lumbar laminectomy/decompression microdiscectomy 07/26/2011    Procedure: LUMBAR LAMINECTOMY/DECOMPRESSION MICRODISCECTOMY;  Surgeon: Javier Docker, MD;  Location: [facility];  Service: Orthopedics;  Laterality: Right;  Re-Do Lumbar Decompression L5-S1 on Right,     No family history on file.  History  Substance Use Topics  . Smoking status: Current Some Day Smoker -- 1.0 packs/day for 20 years    Types: Cigarettes  . Smokeless tobacco: Never Used  . Alcohol Use: Yes     2 MIXED DRINKS (VODKA) DAILY      Review of Systems  Constitutional: Negative for fever and chills.  HENT: Negative for neck pain.   Eyes: Negative for photophobia and visual disturbance.  Musculoskeletal: Positive for myalgias and back pain.  Neurological: Positive for tremors and weakness. Negative for dizziness, seizures, syncope, facial asymmetry, speech difficulty, light-headedness, numbness and headaches.    Allergies  Other  Home Medications   Current Outpatient Rx  Name Route Sig Dispense Refill  . CITALOPRAM HYDROBROMIDE 40 MG PO TABS Oral Take 40 mg by mouth daily with breakfast.    . CYCLOBENZAPRINE HCL 10 MG PO TABS Oral Take 1 tablet (10 mg total) by mouth 3 (three) times daily as needed. For muscle spasms 90 tablet 1  .  GABAPENTIN 300 MG PO CAPS Oral Take 600 mg by mouth 3 (three) times daily.    Marland Kitchen HYDROMORPHONE HCL 2 MG PO TABS Oral Take 1 mg by mouth every 8 (eight) hours as needed. For severe pain    . IBUPROFEN 200 MG PO TABS Oral Take 400-600 mg by mouth every 8 (eight) hours as needed. Patient takes 2 to 3 tablets by mouth every 4 hours as needed For pain    . LISINOPRIL-HYDROCHLOROTHIAZIDE 10-12.5 MG PO TABS Oral Take 1 tablet by mouth daily with breakfast.      BP 127/71  Pulse 98  Temp 98.3 F (36.8 C) (Oral)   Resp 14  SpO2 95%  Physical Exam  Nursing note and vitals reviewed. Constitutional: He is oriented to person, place, and time. He appears well-developed and well-nourished. No distress.  HENT:  Head: Normocephalic.  Eyes: Conjunctivae and EOM are normal. Pupils are equal, round, and reactive to light.  Neck: Normal range of motion. Neck supple.       No meningeal signs. No cervical midline tenderness. There is anterior lower neck surgical scar consistent with history of cervical surgery.  Cardiovascular: Normal rate and regular rhythm.   Pulmonary/Chest: Effort normal and breath sounds normal.  Abdominal: Soft.  Genitourinary: Rectum normal.  Musculoskeletal:       Cervical back: Normal.       Thoracic back: Normal.       Lumbar back: Normal.       Lower lumbar surgical scar consistent with history of prior surgery. No tenderness to palpation. No deformities. Normal range of motion.  Neurological: He is alert and oriented to person, place, and time. No cranial nerve deficit or sensory deficit. Coordination and gait normal.       Patient has reduced strength and potential tremor right lower leg. Strength is 3/5. Slight bilateral upper extremity tremor with strength testing. Strength is equal bilaterally in upper extremities. Patient reports normal sensations light touch in all extremities.  Skin: Skin is warm. No rash noted.  Psychiatric: He has a normal mood and affect. His behavior is normal.    ED Course  Procedures   Results for orders placed during the hospital encounter of 10/18/11  CBC WITH DIFFERENTIAL      Component Value Range   WBC 13.6 (*) 4.0 - 10.5 K/uL   RBC 4.54  4.22 - 5.81 MIL/uL   Hemoglobin 15.4  13.0 - 17.0 g/dL   HCT 56.2  13.0 - 86.5 %   MCV 93.8  78.0 - 100.0 fL   MCH 33.9  26.0 - 34.0 pg   MCHC 36.2 (*) 30.0 - 36.0 g/dL   RDW 78.4  69.6 - 29.5 %   Platelets 261  150 - 400 K/uL   Neutrophils Relative 64  43 - 77 %   Neutro Abs 8.6 (*) 1.7 - 7.7 K/uL    Lymphocytes Relative 27  12 - 46 %   Lymphs Abs 3.7  0.7 - 4.0 K/uL   Monocytes Relative 5  3 - 12 %   Monocytes Absolute 0.7  0.1 - 1.0 K/uL   Eosinophils Relative 4  0 - 5 %   Eosinophils Absolute 0.5  0.0 - 0.7 K/uL   Basophils Relative 0  0 - 1 %   Basophils Absolute 0.1  0.0 - 0.1 K/uL  BASIC METABOLIC PANEL      Component Value Range   Sodium 134 (*) 135 - 145 mEq/L   Potassium 3.8  3.5 - 5.1 mEq/L   Chloride 99  96 - 112 mEq/L   CO2 23  19 - 32 mEq/L   Glucose, Bld 100 (*) 70 - 99 mg/dL   BUN 19  6 - 23 mg/dL   Creatinine, Ser 2.95  0.50 - 1.35 mg/dL   Calcium 9.1  8.4 - 62.1 mg/dL   GFR calc non Af Amer >90  >90 mL/min   GFR calc Af Amer >90  >90 mL/min  MAGNESIUM      Component Value Range   Magnesium 2.0  1.5 - 2.5 mg/dL      1. Dyskinesia, drug-induced       MDM  Patient seen and evaluated. Patient currently without any tremors or complaints. Patient's family is concerned.   Patient was seen and evaluated with attending physician. Patient has unremarkable exam except for slight tremors. Symptoms are not consistent with seizure as patient had no loss of consciousness. At this time suspect symptoms are related to decrease Neurontin use. Patient has been advised to take additional dose of Neurontin when returned home and followup with Dr. Jillyn Hidden tomorrow. He agrees with this plan.     Angus Seller, [place] 10/19/11 [phone]

## 2012-12-04 ENCOUNTER — Encounter: Payer: BC Managed Care – PPO | Attending: Family Medicine | Admitting: Dietician

## 2012-12-04 ENCOUNTER — Encounter: Payer: Self-pay | Admitting: Dietician

## 2012-12-04 DIAGNOSIS — Z713 Dietary counseling and surveillance: Secondary | ICD-10-CM | POA: Insufficient documentation

## 2012-12-04 NOTE — Progress Notes (Signed)
  Medical Nutrition Therapy:  Appt start time: 1200 end time:  1300.   Assessment:  Primary concerns today: Ryan Holland is here since gained a lot of weight recently. Weighed 218 lbs in early 2013,  And he had lost weight before that. Had several back surgeries which make movement difficult.   Ryan Holland is working now for the last week and works in an office setting up appointments, answering the phone. Had been off of work since May 2014 d/t back surgery. Living with his wife and states that they do the food shopping together and share the cooking. Wife is trying to limit carbs. Trying to do less frying.   Not doing exercise at this time. Cleared to do walking and can do light weights/gentle exercise if stabilizes his core.   MEDICATIONS: see list   DIETARY INTAKE:   24-hr recall:  B ( AM): protein shake with coffee and sugar  Snk ( AM): none  L ( PM): ramen bowl or burger at [employer] or leftovers from home with water or sweet tea or energy drink from [employer] Snk ( PM): chips or pretzels with peanut butter,   D ( PM): chicken or lamb or seafood with vegetables with water and 2-3 pepsi with vodka or wine  Snk ( PM): "crunchy food" chips or pretzels, or cucumber and tomato salad with feta Beverages: 20 oz coffee, water of the day  Usual physical activity: none  Estimated energy needs: 2200 calories 248 g carbohydrates 165 g protein 61 g fat  Progress Towards Goal(s):  In progress.   Nutritional Diagnosis:  NB-1.1 Food and nutrition-related knowledge deficit As related to excessive snacking and large portion sizes.  As evidenced by BMI of 40.2 and 50+ pound weight gain in past 18 months.    Intervention:  Nutrition counseling provided. Recommended eating more mindfully, paying attention to hunger/fullness, and have snacks if he is hungry. Recommended increasing vegetable intake and limiting alcohol and other caloric beverages to help with weight loss.   Plan: Add 2-3 snacks per  day with protein if you are hungry, especially in the morning. Portion our snacks ahead of time instead of eating from the container. Drink mostly water or no calorie beverages.  Eat meals at the table without TV or distraction. Serve smaller portions of food on your plate, have second if you are hungry after 20 minutes. Fill half of your plate with vegetables and limit starches to a quarter of the plate.  Work your way up to 30 minutes of exercise 5 x week.   Handouts given during visit include:  MyPlate Handout  95A CHO Snacks  Monitoring/Evaluation:  Dietary intake, exercise, mindful eating, and body weight in 2 month(s).

## 2012-12-04 NOTE — Patient Instructions (Addendum)
Add 2-3 snacks per day with protein if you are hungry, especially in the morning. Portion our snacks ahead of time instead of eating from the container. Drink mostly water or no calorie beverages.  Eat meals at the table without TV or distraction. Serve smaller portions of food on your plate, have second if you are hungry after 20 minutes. Fill half of your plate with vegetables and limit starches to a quarter of the plate.  Work your way up to 30 minutes of exercise 5 x week.

## 2014-08-24 ENCOUNTER — Encounter: Payer: Self-pay | Admitting: Pulmonary Disease

## 2014-08-25 ENCOUNTER — Institutional Professional Consult (permissible substitution): Payer: Self-pay | Admitting: Pulmonary Disease

## 2014-08-25 ENCOUNTER — Encounter (INDEPENDENT_AMBULATORY_CARE_PROVIDER_SITE_OTHER): Payer: Self-pay

## 2014-08-25 ENCOUNTER — Encounter: Payer: Self-pay | Admitting: Pulmonary Disease

## 2014-08-25 ENCOUNTER — Ambulatory Visit (INDEPENDENT_AMBULATORY_CARE_PROVIDER_SITE_OTHER)
Admission: RE | Admit: 2014-08-25 | Discharge: 2014-08-25 | Disposition: A | Discharge status: Home / Self Care | Payer: 59 | Source: Ambulatory Visit | Attending: Pulmonary Disease | Admitting: Pulmonary Disease

## 2014-08-25 ENCOUNTER — Ambulatory Visit (INDEPENDENT_AMBULATORY_CARE_PROVIDER_SITE_OTHER): Payer: 59 | Admitting: Pulmonary Disease

## 2014-08-25 VITALS — BP 154/82 | HR 93 | Ht 70.0 in | Wt 281.0 lb

## 2014-08-25 DIAGNOSIS — J3089 Other allergic rhinitis: Secondary | ICD-10-CM | POA: Diagnosis not present

## 2014-08-25 DIAGNOSIS — R059 Cough, unspecified: Secondary | ICD-10-CM

## 2014-08-25 DIAGNOSIS — R05 Cough: Secondary | ICD-10-CM

## 2014-08-25 DIAGNOSIS — K219 Gastro-esophageal reflux disease without esophagitis: Secondary | ICD-10-CM | POA: Diagnosis not present

## 2014-08-25 DIAGNOSIS — J309 Allergic rhinitis, unspecified: Secondary | ICD-10-CM | POA: Insufficient documentation

## 2014-08-25 MED ORDER — BENZONATATE 200 MG PO CAPS: 200.0000 mg | ORAL_CAPSULE | Freq: Three times a day (TID) | ORAL | Status: DC | PRN

## 2014-08-25 NOTE — Addendum Note (Signed)
Addended by: Velvet Bathe on: 08/25/2014 11:24 AM   Modules accepted: Orders

## 2014-08-25 NOTE — Assessment & Plan Note (Signed)
As detailed above 

## 2014-08-25 NOTE — Assessment & Plan Note (Signed)
He has had 2 years of unrelenting dry cough which is typically worse when lying flat. I explained to him that I think that this is likely a multifactorial problem from postnasal drip as well as acid reflux. However, his primary care physician has done an excellent job of medical therapy for both of these conditions and unfortunately the cough has persisted. We will first start her evaluation for looking for lung disease with a pulmonary function test and a chest x-ray. Honestly, I don't think I'm going to find anything because his lungs are clear on exam today but considering his smoking history this is certainly worthwhile.  In the meantime I'm going to ask him to ramp up therapy for acid reflux with changing his diet and trying to lose weight. I want him to continue his antiacid therapy. I'm also going to ask that he be more aggressive with his allergic rhinitis regimen.  I've also encouraged him to rest his voice is much as possible his ongoing laryngeal irritation often contributes to lingering cough.  Plan: Add lifestyle modifications for acid reflux Use nasal steroid for allergic rhinitis regular, not as needed Voice rest was encouraged in about 2 weeks, use Tessalon Perles to help with this Chest x-ray Pulmonary function test CT sinuses If no improvement in 4 weeks then we will refer to [facility] for an esophageal impedance probe and pH probe

## 2014-08-25 NOTE — Progress Notes (Signed)
Subjective:    Patient ID: Ryan Holland, male    DOB: 06-09-1970, 44 y.o.   MRN: [id number]  HPI Chief Complaint  Patient presents with  . Advice Only    Referred by Lovenia Kim PA for chronic cough X1-2 years.     This is a very pleasant 44 year old former smoker who comes to my clinic today for evaluation of a chronic dry cough. He previously smoked one pack of cigarettes daily from age 63 to age 83 and quit 3 years ago. He has never been diagnosed with a lung disease that he recalls having bronchitis quite a bit when he was a child. He said his father was a smoker and had COPD.  The last 2 years he has had a dry cough which is rarely productive of a scant amount of mucus more in the mornings. He says that the cough is more frequent when he lays flat at night. However, he has elevated the head of his bed and this is not made a difference in his cough. The cough is so severe in the evenings that he frequently cannot sleep nor can he uses CPAP machine. Sometimes he coughs a much that he vomits. He says that the cough is sometimes worse after eating certain foods. It is not necessarily associated with hoarseness though he did have a bout of this several months ago. He says that talking to people or participating in conversation does not necessarily increase the frequency of the cough. The cough does occur during the daytime but he has not been able to identify clear triggers. Sometimes when he goes outside and measures the lawn he says the cough will get worse. This is not necessarily associated with sinus congestion but sometimes it is associated with "tearing up". He says that he has treated allergic rhinitis aggressively this year because it was worse several months ago. He has not been using the Flonase on a regular basis in the last few weeks. His primary care physician has tried treating the cough by changing his blood pressure medications, treating his acid reflux aggressively, and encouraging  him to use nasal steroid and in a histamine's for his allergic rhinitis.  He notes some symptoms of acid reflux on occasion, not frequently.  Past Medical History  Diagnosis Date  . Hypertension   . GERD (gastroesophageal reflux disease)     OCCAS-WOULD USE OTC MED IF NEEDED  . Anxiety   . HNP (herniated nucleus pulposus), lumbar     PAIN IN LOWER BACK AND PAIN DOWN BOTH LEGS   . Sleep apnea     HAS CPAP-DOES NOT USE-DOES NOT KNOW SETTINGS     Family History  Problem Relation Age of Onset  . COPD Father   . Congestive Heart Failure Father   . Breast cancer Mother      History   Social History  . Marital Status: Married    Spouse Name: N/A  . Number of Children: N/A  . Years of Education: N/A   Occupational History  . Not on file.   Social History Main Topics  . Smoking status: Former Smoker -- 1.00 packs/day for 20 years    Types: Cigarettes    Quit date: 10/05/2011  . Smokeless tobacco: Former Neurosurgeon    Types: Chew  . Alcohol Use: 0.0 oz/week    0 Standard drinks or equivalent per week     Comment: 2 MIXED DRINKS (VODKA) DAILY  . Drug Use: No  . Sexual Activity:  Not on file   Other Topics Concern  . Not on file   Social History Narrative     Allergies  Allergen Reactions  . Other     LEMON-sores in mouth     Outpatient Prescriptions Prior to Visit  Medication Sig Dispense Refill  . cetirizine (ZYRTEC) 10 MG tablet Take 10 mg by mouth daily.    . cyclobenzaprine (FLEXERIL) 10 MG tablet Take 10 mg by mouth daily as needed for muscle spasms.    . DULoxetine (CYMBALTA) 60 MG capsule Take 60 mg by mouth daily.    . fluticasone (FLONASE) 50 MCG/ACT nasal spray Place 2 sprays into both nostrils daily.    Marland Kitchen ibuprofen (ADVIL,MOTRIN) 800 MG tablet Take 800 mg by mouth 2 (two) times daily.     . pantoprazole (PROTONIX) 40 MG tablet Take 40 mg by mouth daily.    . ranitidine (ZANTAC) 150 MG capsule Take 150 mg by mouth 2 (two) times daily.    . tapentadol  (NUCYNTA) 50 MG TABS tablet Take 50 mg by mouth every 6 (six) hours as needed.    Marland Kitchen losartan-hydrochlorothiazide (HYZAAR) 100-12.5 MG per tablet Take 1 tablet by mouth daily.    Marland Kitchen albuterol (PROAIR HFA) 108 (90 BASE) MCG/ACT inhaler Inhale 2 puffs into the lungs every 4 (four) hours as needed for wheezing or shortness of breath.    . gabapentin (NEURONTIN) 300 MG capsule Take 600 mg by mouth 3 (three) times daily.    Marland Kitchen oxycodone (OXY-IR) 5 MG capsule Take 2.5 mg by mouth 3 (three) times daily.      No facility-administered medications prior to visit.       Review of Systems  Constitutional: Negative for fever and unexpected weight change.  HENT: Positive for congestion. Negative for dental problem, ear pain, nosebleeds, postnasal drip, rhinorrhea, sinus pressure, sneezing, sore throat and trouble swallowing.   Eyes: Negative for redness and itching.  Respiratory: Positive for cough, chest tightness and shortness of breath. Negative for wheezing.   Cardiovascular: Negative for palpitations and leg swelling.  Gastrointestinal: Negative for nausea and vomiting.  Genitourinary: Negative for dysuria.  Musculoskeletal: Negative for joint swelling.  Skin: Negative for rash.  Neurological: Negative for headaches.  Hematological: Does not bruise/bleed easily.  Psychiatric/Behavioral: Negative for dysphoric mood. The patient is not nervous/anxious.        Objective:   Physical Exam Filed Vitals:   08/25/14 1034  BP: 154/82  Pulse: 93  Height: 5\' 10"  (1.778 m)  Weight: 281 lb (127.461 kg)  SpO2: 98%   RA  Gen: well appearing, no acute distress HENT: NCAT, OP clear, neck supple without masses Eyes: PERRL, EOMi Lymph: no cervical lymphadenopathy PULM: CTA B CV: RRR, no mgr, no JVD GI: BS+, soft, nontender, no hsm Derm: no rash or skin breakdown MSK: normal bulk and tone Neuro: A&Ox4, CN II-XII intact, strength 5/5 in all 4 extremities Psyche: normal mood and affect  Office  notes from primary care physician were reviewed where he was treated for acid reflux and cough.      Assessment & Plan:  Cough He has had 2 years of unrelenting dry cough which is typically worse when lying flat. I explained to him that I think that this is likely a multifactorial problem from postnasal drip as well as acid reflux. However, his primary care physician has done an excellent job of medical therapy for both of these conditions and unfortunately the cough has persisted. We will first start  her evaluation for looking for lung disease with a pulmonary function test and a chest x-ray. Honestly, I don't think I'm going to find anything because his lungs are clear on exam today but considering his smoking history this is certainly worthwhile.  In the meantime I'm going to ask him to ramp up therapy for acid reflux with changing his diet and trying to lose weight. I want him to continue his antiacid therapy. I'm also going to ask that he be more aggressive with his allergic rhinitis regimen.  I've also encouraged him to rest his voice is much as possible his ongoing laryngeal irritation often contributes to lingering cough.  Plan: Add lifestyle modifications for acid reflux Use nasal steroid for allergic rhinitis regular, not as needed Voice rest was encouraged in about 2 weeks, use Tessalon Perles to help with this Chest x-ray Pulmonary function test CT sinuses If no improvement in 4 weeks then we will refer to [facility] for an esophageal impedance probe and pH probe  Allergic rhinitis As detailed above  Acid reflux As detailed above    Current outpatient prescriptions:  .  cetirizine (ZYRTEC) 10 MG tablet, Take 10 mg by mouth daily., Disp: , Rfl:  .  cyclobenzaprine (FLEXERIL) 10 MG tablet, Take 10 mg by mouth daily as needed for muscle spasms., Disp: , Rfl:  .  DULoxetine (CYMBALTA) 60 MG capsule, Take 60 mg by mouth daily., Disp: , Rfl:  .  fluticasone (FLONASE) 50 MCG/ACT  nasal spray, Place 2 sprays into both nostrils daily., Disp: , Rfl:  .  ibuprofen (ADVIL,MOTRIN) 800 MG tablet, Take 800 mg by mouth 2 (two) times daily. , Disp: , Rfl:  .  losartan (COZAAR) 100 MG tablet, Take 100 mg by mouth daily., Disp: , Rfl:  .  pantoprazole (PROTONIX) 40 MG tablet, Take 40 mg by mouth daily., Disp: , Rfl:  .  ranitidine (ZANTAC) 150 MG capsule, Take 150 mg by mouth 2 (two) times daily., Disp: , Rfl:  .  tapentadol (NUCYNTA) 50 MG TABS tablet, Take 50 mg by mouth every 6 (six) hours as needed., Disp: , Rfl:  .  benzonatate (TESSALON) 200 MG capsule, Take 1 capsule (200 mg total) by mouth 3 (three) times daily as needed for cough., Disp: 30 capsule, Rfl: 1

## 2014-08-25 NOTE — Patient Instructions (Signed)
We will arrange a chest x-ray as well as a pulmonary function test to evaluate her cough Follow the acid reflux guidelines we recommended today  For your sinuses: Use Flonase or Nasacort two puffs in each nostril once per day.  Remember that the Nasacort can take 1-2 weeks to work after regular use. Use generic zyrtec (cetirizine) every day.  If this doesn't help, then stop taking it and use chlorpheniramine-phenylephrine combination tablets.  You need to try to suppress your cough to allow your larynx (voice box) to heal.  For three days don't talk, laugh, sing, or clear your throat. Do everything you can to suppress the cough during this time. Use hard candies (sugarless Jolly Ranchers) or non-mint or non-menthol containing cough drops during this time to soothe your throat.  Use a cough suppressant (Delsym or what I have prescribed you) around the clock during this time.  After three days, gradually increase the use of your voice and back off on the cough suppressants.  We will also order a CT scan of you sinuses to evaluate for sinus disease  We will see you back in 4 weeks or sooner if needed

## 2014-09-02 ENCOUNTER — Encounter (HOSPITAL_COMMUNITY): Payer: Self-pay

## 2014-09-02 ENCOUNTER — Ambulatory Visit (HOSPITAL_COMMUNITY): Payer: 59

## 2014-09-10 ENCOUNTER — Encounter (HOSPITAL_COMMUNITY): Payer: Self-pay

## 2014-09-10 ENCOUNTER — Ambulatory Visit (HOSPITAL_COMMUNITY)
Admission: RE | Admit: 2014-09-10 | Discharge: 2014-09-10 | Disposition: A | Discharge status: Home / Self Care | Payer: 59 | Source: Ambulatory Visit | Attending: Pulmonary Disease | Admitting: Pulmonary Disease

## 2014-09-10 DIAGNOSIS — R05 Cough: Secondary | ICD-10-CM | POA: Diagnosis not present

## 2014-09-10 DIAGNOSIS — H0520 Unspecified exophthalmos: Secondary | ICD-10-CM | POA: Diagnosis not present

## 2014-09-10 DIAGNOSIS — R059 Cough, unspecified: Secondary | ICD-10-CM

## 2014-09-10 DIAGNOSIS — I10 Essential (primary) hypertension: Secondary | ICD-10-CM | POA: Insufficient documentation

## 2014-09-10 LAB — PULMONARY FUNCTION TEST
DL/VA % pred: 109 %
DL/VA: 5.11 ml/min/mmHg/L
DLCO UNC % PRED: 86 %
DLCO unc: 27.88 ml/min/mmHg
FEF 25-75 PRE: 4.25 L/s
FEF 25-75 Post: 3.48 L/sec
FEF2575-%Change-Post: -18 %
FEF2575-%Pred-Post: 90 %
FEF2575-%Pred-Pre: 110 %
FEV1-%CHANGE-POST: -5 %
FEV1-%Pred-Post: 81 %
FEV1-%Pred-Pre: 85 %
FEV1-PRE: 3.57 L
FEV1-Post: 3.37 L
FEV1FVC-%Change-Post: 0 %
FEV1FVC-%PRED-PRE: 107 %
FEV6-%Change-Post: -6 %
FEV6-%Pred-Post: 77 %
FEV6-%Pred-Pre: 82 %
FEV6-POST: 3.94 L
FEV6-PRE: 4.2 L
FEV6FVC-%CHANGE-POST: 0 %
FEV6FVC-%Pred-Post: 103 %
FEV6FVC-%Pred-Pre: 103 %
FVC-%Change-Post: -6 %
FVC-%Pred-Post: 75 %
FVC-%Pred-Pre: 80 %
FVC-POST: 3.94 L
FVC-Pre: 4.2 L
POST FEV6/FVC RATIO: 100 %
Post FEV1/FVC ratio: 85 %
Pre FEV1/FVC ratio: 85 %
Pre FEV6/FVC Ratio: 100 %
RV % pred: 87 %
RV: 1.65 L
TLC % PRED: 84 %
TLC: 5.84 L

## 2014-09-10 MED ORDER — ALBUTEROL SULFATE (2.5 MG/3ML) 0.083% IN NEBU
2.5000 mg | INHALATION_SOLUTION | Freq: Once | RESPIRATORY_TRACT | Status: AC
  Administered 2014-09-10: 2.5 mg via RESPIRATORY_TRACT

## 2014-09-11 ENCOUNTER — Other Ambulatory Visit: Payer: Self-pay

## 2014-09-11 DIAGNOSIS — R059 Cough, unspecified: Secondary | ICD-10-CM

## 2014-09-11 DIAGNOSIS — R05 Cough: Secondary | ICD-10-CM

## 2014-09-11 DIAGNOSIS — R930 Abnormal findings on diagnostic imaging of skull and head, not elsewhere classified: Secondary | ICD-10-CM

## 2014-09-24 ENCOUNTER — Ambulatory Visit: Payer: 59 | Admitting: Pulmonary Disease

## 2014-10-20 ENCOUNTER — Encounter: Payer: Self-pay | Admitting: Pulmonary Disease

## 2014-10-20 ENCOUNTER — Ambulatory Visit (INDEPENDENT_AMBULATORY_CARE_PROVIDER_SITE_OTHER): Payer: 59 | Admitting: Pulmonary Disease

## 2014-10-20 VITALS — BP 124/76 | HR 97 | Ht 70.0 in | Wt 281.0 lb

## 2014-10-20 DIAGNOSIS — K219 Gastro-esophageal reflux disease without esophagitis: Secondary | ICD-10-CM

## 2014-10-20 DIAGNOSIS — R05 Cough: Secondary | ICD-10-CM | POA: Diagnosis not present

## 2014-10-20 DIAGNOSIS — R059 Cough, unspecified: Secondary | ICD-10-CM

## 2014-10-20 MED ORDER — MONTELUKAST SODIUM 10 MG PO TABS: 10.0000 mg | ORAL_TABLET | Freq: Every day | ORAL | Status: DC

## 2014-10-20 NOTE — Patient Instructions (Signed)
Keep taking your medications as you are doing Go back to ENT We will refer you to [facility] for an esophageal pH probe I will let you know what the pharmacy says about taking Elavil with your other medications We will se you back in 6 weeks or sooner if needed

## 2014-10-20 NOTE — Progress Notes (Signed)
Subjective:    Patient ID: Ryan Holland, male    DOB: December 22, 1970, 44 y.o.   MRN: [id number]  Synopsis: Referred for chronic refractory cough in 2016 after extensive evaluation by his primary care physician. Has allergic rhinitis and cough Pulmonary function testing in 2016 showed no evidence of lung disease, chest x-ray was normal CT scan of the sinuses showed ethmoid sinusitis  HPI Chief Complaint  Patient presents with  . Follow-up    review ct sinus.  pt's cough unchanged- c/o sometimes prod cough with clear mucus.  has been referred to ENT, was only given abx.      Mr. [name] says that he is still coughing on a regular basis. He has noticed that it is worse when he goes outside and works in the yard and this is associated with postnasal drip. He says that talking also makes it worse. He also notes acid reflux despite taking a proton pump inhibitor as well as an anti-histamine. He has been trying to follow the acid reflux lifestyle modification we recommended. He saw ear nose and throat and they prescribed and antibiotics because of the ethmoid sinusitis, he says that this did not help his cough. He has not gone back to see them since. Past Medical History  Diagnosis Date  . Hypertension   . GERD (gastroesophageal reflux disease)     OCCAS-WOULD USE OTC MED IF NEEDED  . Anxiety   . HNP (herniated nucleus pulposus), lumbar     PAIN IN LOWER BACK AND PAIN DOWN BOTH LEGS   . Sleep apnea     HAS CPAP-DOES NOT USE-DOES NOT KNOW SETTINGS      Review of Systems     Objective:   Physical Exam Filed Vitals:   10/20/14 1205  BP: 124/76  Pulse: 97  Height: 5\' 10"  (1.778 m)  Weight: 281 lb (127.461 kg)  SpO2: 98%    Gen: well appearing HENT: OP clear, neck supple PULM: CTA B, normal percussion CV: RRR, no mgr, trace edema GI: BS+, soft, nontender Derm: no cyanosis or rash Psyche: normal mood and affect   July 2016 pulmonary function testing showed no evidence of  obstruction, DLCO and total lung capacity normal. July 2016 chest x-ray without evidence of lung disease     Assessment & Plan:  Cough He continues to struggle with cough. This is primarily due to postnasal drip and acid reflux. He is on maximal therapy for acid reflux and has been following lifestyle modification. Further, despite using a nasal steroid he continues to have allergic rhinitis type symptoms. He also had abnormal findings on his CT scans of his sinuses suggestive of an infection. EENT treated him with antibiotics but this did not make a difference.  Notably he does not have evidence of lung disease.  Plan: I'm going to refer him to [facility] for esophageal impedance and pH probe as I believe he is still having ongoing reflux Start Singulair Continue current medications Because of ongoing laryngeal irritation I would like to start Elavil, but I need to talk to the pharmacist first because of all the medications that he takes. Of note, he did not respond to pregabalin or gabapentin in the past.    Current outpatient prescriptions:  .  benzonatate (TESSALON) 200 MG capsule, Take 1 capsule (200 mg total) by mouth 3 (three) times daily as needed for cough., Disp: 30 capsule, Rfl: 1 .  cetirizine (ZYRTEC) 10 MG tablet, Take 10 mg by mouth daily., Disp: ,  Rfl:  .  cyclobenzaprine (FLEXERIL) 10 MG tablet, Take 10 mg by mouth daily as needed for muscle spasms., Disp: , Rfl:  .  DULoxetine (CYMBALTA) 60 MG capsule, Take 60 mg by mouth daily., Disp: , Rfl:  .  fluticasone (FLONASE) 50 MCG/ACT nasal spray, Place 2 sprays into both nostrils daily., Disp: , Rfl:  .  ibuprofen (ADVIL,MOTRIN) 800 MG tablet, Take 800 mg by mouth 2 (two) times daily. , Disp: , Rfl:  .  losartan (COZAAR) 100 MG tablet, Take 100 mg by mouth daily., Disp: , Rfl:  .  pantoprazole (PROTONIX) 40 MG tablet, Take 40 mg by mouth daily., Disp: , Rfl:  .  ranitidine (ZANTAC) 150 MG capsule, Take 150 mg by mouth 2 (two)  times daily., Disp: , Rfl:  .  tapentadol (NUCYNTA) 50 MG TABS tablet, Take 50 mg by mouth every 6 (six) hours as needed., Disp: , Rfl:  .  montelukast (SINGULAIR) 10 MG tablet, Take 1 tablet (10 mg total) by mouth daily., Disp: 30 tablet, Rfl: 2

## 2014-10-20 NOTE — Assessment & Plan Note (Signed)
He continues to struggle with cough. This is primarily due to postnasal drip and acid reflux. He is on maximal therapy for acid reflux and has been following lifestyle modification. Further, despite using a nasal steroid he continues to have allergic rhinitis type symptoms. He also had abnormal findings on his CT scans of his sinuses suggestive of an infection. EENT treated him with antibiotics but this did not make a difference.  Notably he does not have evidence of lung disease.  Plan: I'm going to refer him to [facility] for esophageal impedance and pH probe as I believe he is still having ongoing reflux Start Singulair Continue current medications Because of ongoing laryngeal irritation I would like to start Elavil, but I need to talk to the pharmacist first because of all the medications that he takes. Of note, he did not respond to pregabalin or gabapentin in the past.

## 2014-12-02 ENCOUNTER — Ambulatory Visit: Payer: 59 | Admitting: Pulmonary Disease

## 2014-12-16 ENCOUNTER — Other Ambulatory Visit: Payer: Self-pay | Admitting: Gastroenterology

## 2014-12-16 NOTE — Addendum Note (Signed)
Addended by: Vern Prestia on: 12/16/2014 11:39 AM   Modules accepted: Orders  

## 2014-12-24 ENCOUNTER — Encounter (HOSPITAL_COMMUNITY): Payer: Self-pay | Admitting: *Deleted

## 2014-12-29 ENCOUNTER — Other Ambulatory Visit: Payer: Self-pay | Admitting: Gastroenterology

## 2014-12-30 ENCOUNTER — Encounter (HOSPITAL_COMMUNITY): Payer: Self-pay | Admitting: Anesthesiology

## 2014-12-30 ENCOUNTER — Ambulatory Visit (HOSPITAL_COMMUNITY): Payer: 59 | Admitting: Anesthesiology

## 2014-12-30 ENCOUNTER — Ambulatory Visit (HOSPITAL_COMMUNITY)
Admission: RE | Admit: 2014-12-30 | Discharge: 2014-12-30 | Disposition: A | Discharge status: Home / Self Care | Payer: 59 | Source: Ambulatory Visit | Attending: Gastroenterology | Admitting: Gastroenterology

## 2014-12-30 ENCOUNTER — Encounter (HOSPITAL_COMMUNITY)
Admission: RE | Disposition: A | Discharge status: Home / Self Care | Payer: Self-pay | Source: Ambulatory Visit | Attending: Gastroenterology

## 2014-12-30 DIAGNOSIS — Z6841 Body Mass Index (BMI) 40.0 and over, adult: Secondary | ICD-10-CM | POA: Insufficient documentation

## 2014-12-30 DIAGNOSIS — Z9989 Dependence on other enabling machines and devices: Secondary | ICD-10-CM | POA: Insufficient documentation

## 2014-12-30 DIAGNOSIS — I10 Essential (primary) hypertension: Secondary | ICD-10-CM | POA: Insufficient documentation

## 2014-12-30 DIAGNOSIS — Z79891 Long term (current) use of opiate analgesic: Secondary | ICD-10-CM | POA: Insufficient documentation

## 2014-12-30 DIAGNOSIS — K210 Gastro-esophageal reflux disease with esophagitis: Secondary | ICD-10-CM | POA: Insufficient documentation

## 2014-12-30 DIAGNOSIS — K2970 Gastritis, unspecified, without bleeding: Secondary | ICD-10-CM | POA: Insufficient documentation

## 2014-12-30 DIAGNOSIS — K219 Gastro-esophageal reflux disease without esophagitis: Secondary | ICD-10-CM | POA: Diagnosis present

## 2014-12-30 DIAGNOSIS — G4733 Obstructive sleep apnea (adult) (pediatric): Secondary | ICD-10-CM | POA: Diagnosis not present

## 2014-12-30 DIAGNOSIS — R05 Cough: Secondary | ICD-10-CM | POA: Diagnosis not present

## 2014-12-30 DIAGNOSIS — J45909 Unspecified asthma, uncomplicated: Secondary | ICD-10-CM | POA: Insufficient documentation

## 2014-12-30 DIAGNOSIS — Z87891 Personal history of nicotine dependence: Secondary | ICD-10-CM | POA: Diagnosis not present

## 2014-12-30 DIAGNOSIS — Z79899 Other long term (current) drug therapy: Secondary | ICD-10-CM | POA: Diagnosis not present

## 2014-12-30 DIAGNOSIS — Z7951 Long term (current) use of inhaled steroids: Secondary | ICD-10-CM | POA: Insufficient documentation

## 2014-12-30 DIAGNOSIS — Z791 Long term (current) use of non-steroidal anti-inflammatories (NSAID): Secondary | ICD-10-CM | POA: Diagnosis not present

## 2014-12-30 HISTORY — PX: ESOPHAGOGASTRODUODENOSCOPY (EGD) WITH PROPOFOL: SHX5813

## 2014-12-30 SURGERY — ESOPHAGOGASTRODUODENOSCOPY (EGD) WITH PROPOFOL
Anesthesia: Monitor Anesthesia Care

## 2014-12-30 MED ORDER — SODIUM CHLORIDE 0.9 % IV SOLN: INTRAVENOUS | Status: DC

## 2014-12-30 MED ORDER — PROPOFOL 500 MG/50ML IV EMUL
INTRAVENOUS | Status: DC | PRN
  Administered 2014-12-30: 140 ug/kg/min via INTRAVENOUS

## 2014-12-30 MED ORDER — FENTANYL CITRATE (PF) 100 MCG/2ML IJ SOLN
INTRAMUSCULAR | Status: DC | PRN
  Administered 2014-12-30: 50 ug via INTRAVENOUS

## 2014-12-30 MED ORDER — FENTANYL CITRATE (PF) 100 MCG/2ML IJ SOLN
INTRAMUSCULAR | Status: AC
  Filled 2014-12-30: qty 4

## 2014-12-30 MED ORDER — LIDOCAINE HCL (CARDIAC) 20 MG/ML IV SOLN
INTRAVENOUS | Status: DC | PRN
  Administered 2014-12-30: 100 mg via INTRAVENOUS

## 2014-12-30 MED ORDER — BUTAMBEN-TETRACAINE-BENZOCAINE 2-2-14 % EX AERO
INHALATION_SPRAY | CUTANEOUS | Status: DC | PRN
  Administered 2014-12-30: 2 via TOPICAL

## 2014-12-30 MED ORDER — PROPOFOL 10 MG/ML IV BOLUS
INTRAVENOUS | Status: AC
  Filled 2014-12-30: qty 20

## 2014-12-30 MED ORDER — SODIUM CHLORIDE 0.9 % IJ SOLN
INTRAMUSCULAR | Status: AC
  Filled 2014-12-30: qty 10

## 2014-12-30 MED ORDER — LACTATED RINGERS IV SOLN
INTRAVENOUS | Status: DC
  Administered 2014-12-30: 1000 mL via INTRAVENOUS

## 2014-12-30 MED ORDER — LIDOCAINE HCL (CARDIAC) 20 MG/ML IV SOLN
INTRAVENOUS | Status: AC
  Filled 2014-12-30: qty 5

## 2014-12-30 MED ORDER — PROPOFOL 10 MG/ML IV BOLUS
INTRAVENOUS | Status: DC | PRN
  Administered 2014-12-30: 20 mg via INTRAVENOUS

## 2014-12-30 MED ORDER — EPHEDRINE SULFATE 50 MG/ML IJ SOLN
INTRAMUSCULAR | Status: AC
  Filled 2014-12-30: qty 1

## 2014-12-30 SURGICAL SUPPLY — 15 items

## 2014-12-30 NOTE — Anesthesia Preprocedure Evaluation (Signed)
Anesthesia Evaluation  Patient identified by MRN, date of birth, ID band Patient awake    Reviewed: Allergy & Precautions, H&P , NPO status , Patient's Chart, lab work & pertinent test results  Airway Mallampati: II  TM Distance: <3 FB Neck ROM: Full    Dental no notable dental hx.    Pulmonary sleep apnea and Continuous Positive Airway Pressure Ventilation , Current Smoker, former smoker,    Pulmonary exam normal breath sounds clear to auscultation       Cardiovascular hypertension, Pt. on medications Normal cardiovascular exam Rhythm:Regular Rate:Normal     Neuro/Psych negative neurological ROS  negative psych ROS   GI/Hepatic negative GI ROS, Neg liver ROS, GERD  ,  Endo/Other  Morbid obesity  Renal/GU negative Renal ROS  negative genitourinary   Musculoskeletal negative musculoskeletal ROS (+)   Abdominal   Peds negative pediatric ROS (+)  Hematology negative hematology ROS (+)   Anesthesia Other Findings   Reproductive/Obstetrics negative OB ROS                             Anesthesia Physical  Anesthesia Plan  ASA: III  Anesthesia Plan: MAC   Post-op Pain Management:    Induction:   Airway Management Planned: Natural Airway  Additional Equipment:   Intra-op Plan:   Post-operative Plan:   Informed Consent: I have reviewed the patients History and Physical, chart, labs and discussed the procedure including the risks, benefits and alternatives for the proposed anesthesia with the patient or authorized representative who has indicated his/her understanding and acceptance.   Dental advisory given  Plan Discussed with: CRNA  Anesthesia Plan Comments:         Anesthesia Quick Evaluation

## 2014-12-30 NOTE — H&P (Signed)
Patient interval history reviewed.  Patient examined again.  There has been no change from documented H/P dated 12/03/14 (scanned into chart from our office) except as documented above.  Assessment:  1.  GERD, medically refractory. 2.  Chronic cough, possibly GERD-mediated.  Plan:  1.  Endoscopy with pH capsule (Bravo capsule) placement. 2.  Risks (bleeding, infection, bowel perforation that could require surgery, sedation-related changes in cardiopulmonary systems), benefits (identification and possible treatment of source of symptoms, exclusion of certain causes of symptoms), and alternatives (watchful waiting, radiographic imaging studies, empiric medical treatment) of upper endoscopy (EGD) were explained to patient/family in detail and patient wishes to proceed.

## 2014-12-30 NOTE — Op Note (Signed)
[facility] [address] [place], [id number]   ENDOSCOPY PROCEDURE REPORT  PATIENT: Ryan Holland, Ryan Holland  MR#: [id number] BIRTHDATE: 1970-11-17 , 44  yrs. old GENDER: male ENDOSCOPIST: Willis ModenaWilliam Jasir Rother, MD REFERRED BY: PROCEDURE DATE:  12/30/2014 PROCEDURE:  EGD w/ Bravo capsule placement (ON antisecretory therapy) ASA CLASS:     Class III INDICATIONS:  GERD (medically refractory), chronic cough. MEDICATIONS: Monitored anesthesia care TOPICAL ANESTHETIC:  DESCRIPTION OF PROCEDURE: After the risks benefits and alternatives of the procedure were thoroughly explained, informed consent was obtained.  The Pentax Gastroscope [id number] endoscope was introduced through the mouth and advanced to the second portion of the duodenum. The instrument was slowly withdrawn as the mucosa was fully examined. Estimated blood loss is zero unless otherwise noted in this procedure report.    Findings:  Normal esophagus.  Mild pre-pyloric antritis.  Otherwise normal stomach and pylorus.  No obvious hiatal hernia identified. Normal duodenum to the second portion.       Retroflexed views of cardia were normal.  After conclusion of diagnostic endoscopy, the pH Bravo capsule was placed 6 cm proximal to the GE junction per protocol.  Second-look endoscopy confirmed appropriate positioning of the capsule.          The scope was then withdrawn from the patient and the procedure completed.  COMPLICATIONS: There were no immediate complications.  ENDOSCOPIC IMPRESSION:     Mild gastritis.  No esophagitis or intestinal metaplasia seen.  Successful placement of pH capsule placed as described above.  RECOMMENDATIONS:     1.  Watch for potential complications of procedure. 2.  Continue pantoprazole and ranitidine. 3.  Await Bravo capsule results. 4.  Follow-up in [facility] office in 3-4 weeks, to review Bravo results.  eSigned:  Willis ModenaWilliam Merlin Ege, MD 12/30/2014 10:11 AM   CC:  CPT CODES: ICD  CODES:  The ICD and CPT codes recommended by this software are interpretations from the data that the clinical staff has captured with the software.  The verification of the translation of this report to the ICD and CPT codes and modifiers is the sole responsibility of the health care institution and practicing physician where this report was generated.  [employer] will not be held responsible for the validity of the ICD and CPT codes included on this report.  AMA assumes no liability for data contained or not contained herein. CPT is a Publishing rights managerregistered trademark of the [employer].

## 2014-12-30 NOTE — Discharge Instructions (Signed)
Endoscopy with pH (Bravo capsule) placement  Care After Please read the instructions outlined below and refer to this sheet in the next few weeks. These discharge instructions provide you with general information on caring for yourself after you leave the hospital. Your doctor may also give you specific instructions. While your treatment has been planned according to the most current medical practices available, unavoidable complications occasionally occur. If you have any problems or questions after discharge, please call Dr. Dulce Sellarutlaw [facility]) at [phone].  HOME CARE INSTRUCTIONS Activity  You may resume your regular activity but move at a slower pace for the next 24 hours.   Take frequent rest periods for the next 24 hours.   Walking will help expel (get rid of) the air and reduce the bloated feeling in your abdomen.   No driving for 24 hours (because of the anesthesia (medicine) used during the test).   You may shower.   Do not sign any important legal documents or operate any machinery for 24 hours (because of the anesthesia used during the test).  Nutrition  Drink plenty of fluids.   You may resume your normal diet.   Begin with a light meal and progress to your normal diet.   Avoid alcoholic beverages for 24 hours or as instructed by your caregiver.  Medications You may resume your normal medications unless your caregiver tells you otherwise. What you can expect today  You may experience abdominal discomfort such as a feeling of fullness or "gas" pains.   You may experience a sore throat for 2 to 3 days. This is normal. Gargling with salt water may help this.    SEEK IMMEDIATE MEDICAL CARE IF:  You have excessive nausea (feeling sick to your stomach) and/or vomiting.   You have severe abdominal pain and distention (swelling).   You have trouble swallowing.   You have a temperature over 100 F (37.8 C).   You have rectal bleeding or vomiting of  blood.  Document Released: 10/05/2003 Document Revised: 11/02/2010 Document Reviewed: 04/17/2007 [facility] Patient Information 2012 [employer] [place], [place].

## 2014-12-30 NOTE — Anesthesia Postprocedure Evaluation (Signed)
  Anesthesia Post-op Note  Patient: Ryan JonesRobert Teresi  Procedure(s) Performed: Procedure(s) (LRB): ESOPHAGOGASTRODUODENOSCOPY (EGD) WITH PROPOFOL (N/A)  Patient Location: PACU  Anesthesia Type: MAC  Level of Consciousness: awake and alert   Airway and Oxygen Therapy: Patient Spontanous Breathing  Post-op Pain: mild  Post-op Assessment: Post-op Vital signs reviewed, Patient's Cardiovascular Status Stable, Respiratory Function Stable, Patent Airway and No signs of Nausea or vomiting  Last Vitals:  Filed Vitals:   12/30/14 1050  BP: 136/87  Pulse: 67  Temp:   Resp: 15    Post-op Vital Signs: stable   Complications: No apparent anesthesia complications

## 2014-12-30 NOTE — Transfer of Care (Signed)
Immediate Anesthesia Transfer of Care Note  Patient: Ryan JonesRobert Holland  Procedure(s) Performed: Procedure(s) with comments: ESOPHAGOGASTRODUODENOSCOPY (EGD) WITH PROPOFOL (N/A) - With Bravo  Patient Location: Endoscopy Unit  Anesthesia Type:MAC  Level of Consciousness: awake, alert  and oriented  Airway & Oxygen Therapy: Patient Spontanous Breathing and Patient connected to nasal cannula oxygen  Post-op Assessment: Report given to RN and Post -op Vital signs reviewed and stable  Post vital signs: Reviewed and stable  Last Vitals:  Filed Vitals:   12/30/14 0839  BP: 148/83  Pulse: 77  Temp: 36.6 C  Resp: 16    Complications: No apparent anesthesia complications

## 2014-12-30 NOTE — Addendum Note (Signed)
Addended by: Gerod Caligiuri on: 12/30/2014 09:24 AM   Modules accepted: Orders  

## 2014-12-31 ENCOUNTER — Encounter (HOSPITAL_COMMUNITY): Payer: Self-pay | Admitting: Gastroenterology

## 2014-12-31 NOTE — Addendum Note (Signed)
Addendum  created 12/31/14 1625 by Florene Routeiana L Leyli Kevorkian, CRNA   Modules edited: Anesthesia Attestations

## 2015-01-07 ENCOUNTER — Encounter: Payer: Self-pay | Admitting: Pulmonary Disease

## 2015-01-07 ENCOUNTER — Ambulatory Visit (INDEPENDENT_AMBULATORY_CARE_PROVIDER_SITE_OTHER): Payer: 59 | Admitting: Pulmonary Disease

## 2015-01-07 VITALS — BP 156/94 | HR 94 | Ht 70.0 in | Wt 281.0 lb

## 2015-01-07 DIAGNOSIS — R05 Cough: Secondary | ICD-10-CM

## 2015-01-07 DIAGNOSIS — R059 Cough, unspecified: Secondary | ICD-10-CM

## 2015-01-07 NOTE — Progress Notes (Signed)
Subjective:    Patient ID: Ryan Holland, male    DOB: Feb 21, 1971, 44 y.o.   MRN: [id number]  Synopsis: Referred for chronic refractory cough in 2016 after extensive evaluation by his primary care physician. Has allergic rhinitis and cough Pulmonary function testing in 2016 showed no evidence of lung disease, chest x-ray was normal CT scan of the sinuses showed ethmoid sinusitis  HPI Chief Complaint  Patient presents with  . Follow-up    pt c/o nonprod cough with trace amounts of clear mucus.  pt has had endoscopy on 10/26.   Cough is still bad, particularly worse when he lies flat.  He had the Bravo capsule placed He was seen by the GI doctor and had the Bravo placed. He was seen by the ENT doctor and was told that he needs sinus surgery.   Past Medical History  Diagnosis Date  . Hypertension   . GERD (gastroesophageal reflux disease)     OCCAS-WOULD USE OTC MED IF NEEDED  . Anxiety   . HNP (herniated nucleus pulposus), lumbar     PAIN IN LOWER BACK AND PAIN DOWN BOTH LEGS -12-24-14 radiates down right leg presently.  . Sleep apnea     HAS CPAP-DOES NOT USE-DOES NOT KNOW SETTINGS      Review of Systems     Objective:   Physical Exam Filed Vitals:   01/07/15 1557  BP: 156/94  Pulse: 94  Height:  (1.778 m)  Weight: 281 lb (127.461 kg)  SpO2: 100%    Gen: well appearing HENT: OP clear, neck supple PULM: CTA B, normal percussion CV: RRR, no mgr, trace edema GI: BS+, soft, nontender Derm: no cyanosis or rash Psyche: normal mood and affect  October 2016 endoscopy reviewed were Dr. Dulce Sellar placed a pH probe. The patient was found to have mild gastritis.      Assessment & Plan:  Cough Ryan Holland has severe refractory cough secondary to acid reflux and postnasal drip in the setting of chronic sinusitis and anatomic sinus disease. At this time we are awaiting the results of his 24-hour esophageal pH probe assessment. He will follow-up with  gastroenterology in a few weeks to go over this. He has been told that he should have sinus surgery.  Plan: I recommended that if Dr. Dulce Sellar with gastroenterology feel that he needs to have some sort of a change to his medical regimen or even surgery to manage his gastroesophageal reflux that he proceed with that first. If Dr. Dulce Sellar recommends no changes to his management and I think he should have the sinus surgery. If he continues to have problems with cough after sinus surgery then we will consider him for enrollment in a clinical trial for an investigational agent for chronic refractory cough We will be unable to use Elavil because of significant drug interactions with his other medications. Keep appointment in January with me or follow-up sooner if needed     Current outpatient prescriptions:  .  acetaminophen (TYLENOL) 500 MG tablet, Take 1,000 mg by mouth every 6 (six) hours as needed for mild pain., Disp: , Rfl:  .  cetirizine (ZYRTEC) 10 MG tablet, Take 10 mg by mouth daily., Disp: , Rfl:  .  cyclobenzaprine (FLEXERIL) 10 MG tablet, Take 10 mg by mouth daily as needed for muscle spasms., Disp: , Rfl:  .  DULoxetine (CYMBALTA) 60 MG capsule, Take 60 mg by mouth at bedtime and may repeat dose one time if needed. , Disp: , Rfl:  .  fluticasone (FLONASE) 50 MCG/ACT nasal spray, Place 2 sprays into both nostrils daily., Disp: , Rfl:  .  ibuprofen (ADVIL,MOTRIN) 800 MG tablet, Take 800 mg by mouth 2 (two) times daily. , Disp: , Rfl:  .  losartan (COZAAR) 100 MG tablet, Take 100 mg by mouth every morning. , Disp: , Rfl:  .  montelukast (SINGULAIR) 10 MG tablet, Take 1 tablet (10 mg total) by mouth daily., Disp: 30 tablet, Rfl: 2 .  pantoprazole (PROTONIX) 40 MG tablet, Take 40 mg by mouth 2 (two) times daily. , Disp: , Rfl:  .  ranitidine (ZANTAC) 150 MG capsule, Take 150 mg by mouth 2 (two) times daily., Disp: , Rfl:  .  tapentadol (NUCYNTA) 50 MG TABS tablet, Take 50 mg by mouth every 6  (six) hours as needed for moderate pain. , Disp: , Rfl:

## 2015-01-07 NOTE — Assessment & Plan Note (Signed)
[name] has severe refractory cough secondary to acid reflux and postnasal drip in the setting of chronic sinusitis and anatomic sinus disease. At this time we are awaiting the results of his 24-hour esophageal pH probe assessment. He will follow-up with gastroenterology in a few weeks to go over this. He has been told that he should have sinus surgery.  Plan: I recommended that if Dr. Dulce Holland with gastroenterology feel that he needs to have some sort of a change to his medical regimen or even surgery to manage his gastroesophageal reflux that he proceed with that first. If Dr. Dulce Holland recommends no changes to his management and I think he should have the sinus surgery. If he continues to have problems with cough after sinus surgery then we will consider him for enrollment in a clinical trial for an investigational agent for chronic refractory cough We will be unable to use Elavil because of significant drug interactions with his other medications. Keep appointment in January with me or follow-up sooner if needed

## 2015-01-07 NOTE — Patient Instructions (Signed)
Keep your appointment with Dr. Outlaw If Ryan Holland. Ryan Sellarutlaw does not recommend any further procedures or changes to your management then I suggest you have the sinus surgery Keep your appointment with me in January or come back to see me sooner if needed

## 2015-01-27 ENCOUNTER — Other Ambulatory Visit: Payer: Self-pay | Admitting: Pulmonary Disease

## 2015-01-27 ENCOUNTER — Other Ambulatory Visit: Payer: Self-pay

## 2015-03-09 ENCOUNTER — Encounter (HOSPITAL_COMMUNITY): Payer: Self-pay | Admitting: Emergency Medicine

## 2015-03-09 ENCOUNTER — Emergency Department (HOSPITAL_COMMUNITY)
Admission: EM | Admit: 2015-03-09 | Discharge: 2015-03-10 | Disposition: A | Discharge status: Home / Self Care | Payer: BLUE CROSS/BLUE SHIELD | Attending: Emergency Medicine | Admitting: Emergency Medicine

## 2015-03-09 DIAGNOSIS — Z7951 Long term (current) use of inhaled steroids: Secondary | ICD-10-CM | POA: Insufficient documentation

## 2015-03-09 DIAGNOSIS — F419 Anxiety disorder, unspecified: Secondary | ICD-10-CM | POA: Insufficient documentation

## 2015-03-09 DIAGNOSIS — K219 Gastro-esophageal reflux disease without esophagitis: Secondary | ICD-10-CM | POA: Diagnosis not present

## 2015-03-09 DIAGNOSIS — Z79899 Other long term (current) drug therapy: Secondary | ICD-10-CM | POA: Diagnosis not present

## 2015-03-09 DIAGNOSIS — G8929 Other chronic pain: Secondary | ICD-10-CM | POA: Insufficient documentation

## 2015-03-09 DIAGNOSIS — I10 Essential (primary) hypertension: Secondary | ICD-10-CM | POA: Insufficient documentation

## 2015-03-09 DIAGNOSIS — M545 Low back pain: Secondary | ICD-10-CM | POA: Diagnosis not present

## 2015-03-09 DIAGNOSIS — Z87891 Personal history of nicotine dependence: Secondary | ICD-10-CM | POA: Diagnosis not present

## 2015-03-09 LAB — I-STAT CHEM 8, ED
BUN: 42 mg/dL — ABNORMAL HIGH (ref 6–20)
Calcium, Ion: 1.15 mmol/L (ref 1.12–1.23)
Chloride: 105 mmol/L (ref 101–111)
Creatinine, Ser: 1 mg/dL (ref 0.61–1.24)
Glucose, Bld: 137 mg/dL — ABNORMAL HIGH (ref 65–99)
HEMATOCRIT: 52 % (ref 39.0–52.0)
HEMOGLOBIN: 17.7 g/dL — AB (ref 13.0–17.0)
Potassium: 6 mmol/L — ABNORMAL HIGH (ref 3.5–5.1)
SODIUM: 136 mmol/L (ref 135–145)
TCO2: 23 mmol/L (ref 0–100)

## 2015-03-09 MED ORDER — DIAZEPAM 5 MG PO TABS: 5.0000 mg | ORAL_TABLET | Freq: Four times a day (QID) | ORAL | Status: DC | PRN

## 2015-03-09 MED ORDER — HYDROMORPHONE HCL 1 MG/ML IJ SOLN
0.5000 mg | Freq: Once | INTRAMUSCULAR | Status: AC
  Administered 2015-03-09: 0.5 mg via INTRAVENOUS
  Filled 2015-03-09: qty 1

## 2015-03-09 MED ORDER — OXYCODONE-ACETAMINOPHEN 5-325 MG PO TABS: ORAL_TABLET | ORAL | Status: DC

## 2015-03-09 MED ORDER — SODIUM CHLORIDE 0.9 % IV BOLUS (SEPSIS)
1000.0000 mL | Freq: Once | INTRAVENOUS | Status: AC
  Administered 2015-03-09: 1000 mL via INTRAVENOUS

## 2015-03-09 MED ORDER — HYDROCODONE-ACETAMINOPHEN 5-325 MG PO TABS
2.0000 | ORAL_TABLET | Freq: Once | ORAL | Status: AC
  Administered 2015-03-09: 2 via ORAL
  Filled 2015-03-09: qty 2

## 2015-03-09 MED ORDER — DIAZEPAM 5 MG/ML IJ SOLN
5.0000 mg | Freq: Once | INTRAMUSCULAR | Status: AC
  Administered 2015-03-09: 5 mg via INTRAVENOUS
  Filled 2015-03-09: qty 2

## 2015-03-09 MED ORDER — DEXAMETHASONE SODIUM PHOSPHATE 10 MG/ML IJ SOLN
10.0000 mg | Freq: Once | INTRAMUSCULAR | Status: AC
  Administered 2015-03-09: 10 mg via INTRAVENOUS
  Filled 2015-03-09: qty 1

## 2015-03-09 MED ORDER — HYDROMORPHONE HCL 1 MG/ML IJ SOLN
1.0000 mg | Freq: Once | INTRAMUSCULAR | Status: AC
  Administered 2015-03-09: 1 mg via INTRAVENOUS
  Filled 2015-03-09: qty 1

## 2015-03-09 MED ORDER — KETOROLAC TROMETHAMINE 60 MG/2ML IM SOLN
60.0000 mg | Freq: Once | INTRAMUSCULAR | Status: AC
  Administered 2015-03-09: 60 mg via INTRAMUSCULAR
  Filled 2015-03-09: qty 2

## 2015-03-09 NOTE — ED Notes (Signed)
Bed: WA25 Expected date:  Expected time:  Means of arrival:  Comments: Hall c 

## 2015-03-09 NOTE — ED Provider Notes (Signed)
Medical screening examination/treatment/procedure(s) were conducted as a shared visit with non-physician practitioner(s) and myself.  I personally evaluated the patient during the encounter.   EKG Interpretation   Date/Time:  Tuesday March 09 2015 23:25:37 EST Ventricular Rate:  100 PR Interval:  154 QRS Duration: 91 QT Interval:  347 QTC Calculation: 447 R Axis:   29 Text Interpretation:  Sinus tachycardia Confirmed by Norris Brumbach  MD, Ashanna Heinsohn  [phone]([id number]) on 03/09/2015 11:36:30 PM      Results for orders placed or performed during the hospital encounter of 03/09/15  I-Stat Chem 8, ED  Result Value Ref Range   Sodium 136 135 - 145 mmol/L   Potassium 6.0 (H) 3.5 - 5.1 mmol/L   Chloride 105 101 - 111 mmol/L   BUN 42 (H) 6 - 20 mg/dL   Creatinine, Ser 6.041.00 0.61 - 1.24 mg/dL   Glucose, Bld 540137 (H) 65 - 99 mg/dL   Calcium, Ion 9.811.15 1.911.12 - 1.23 mmol/L   TCO2 23 0 - 100 mmol/L   Hemoglobin 17.7 (H) 13.0 - 17.0 g/dL   HCT 47.852.0 29.539.0 - 62.152.0 %   Comment NOTIFIED PHYSICIAN    Patient with a history of chronic back pain. Seen by fast track of PA earlier in the day. Patient with the attempts at pain control. Patient admits that he probably has been dehydrated. Patient takes a potassium supplement for leg cramps. Patient had electrolytes checked prior to giving him Toradol make sure renal function was fine. And that showed an elevation in potassium of 6.0. EKG and cardiac monitoring shows no acute hyperkalemic changes. Patient's BUNs elevated at 42 but creatinine is normal at 1. In addition patient's hemoglobin is at 17.7 also suggestive of dehydration. Due the fact that there is no cardiac changes we'll hydrate the patient and recheck the potassium. We'll recheck with the lab and they said there was no evidence of hemolysis. If potassium comes down patient can be discharged home if potassium remains elevated he will require Kayexalate and admission.      Vanetta MuldersScott Ilai Hiller, MD 03/09/15 [phone]

## 2015-03-09 NOTE — ED Notes (Signed)
Informed the PA Ryan QuestDave Smith0 of the potassium of 6.0 @ 2111 by QA
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
## 2015-03-09 NOTE — ED Notes (Signed)
Informed provider that patient is requesting additional pain medication.

## 2015-03-09 NOTE — ED Provider Notes (Signed)
History  By signing my name below, I, Karle PlumberJennifer Tensley, attest that this documentation has been prepared under the direction and in the presence of [employer] Steel Corporation[employer]ole Kaylamarie Swickard, [name]. Electronically Signed: Karle PlumberJennifer Tensley, ED Scribe. 03/09/2015. 6:54 PM.  Chief Complaint  Patient presents with  . Back Pain   The history is provided by the patient and medical records. No language interpreter was used.    HPI Comments:  Ryan JonesRobert Holland is a 45 y.o. obese male, brought in by EMS, with PMHx of chronic back pain, who presents to the Emergency Department complaining of severe lower back pain that began approximately 5 days ago. He rates the pain at 10/10. He reports having decompression surgery and a laminectomy 4 and 5 years. He states he is taking Nucynta for pain with no significant relief of the symptoms. He states he had a nerve block about one month ago, the first week of December, that gave him moderate relief. Movement increases the pain. He denies alleviating factors. He denies fever, chills, nausea, vomiting, numbness, tingling or weakness of the lower extremities, bowel or bladder incontinence. He states his orthopedist is Dr. Ethelene Halamos. He denies any trauma, injury or fall.  Past Medical History  Diagnosis Date  . Hypertension   . GERD (gastroesophageal reflux disease)     OCCAS-WOULD USE OTC MED IF NEEDED  . Anxiety   . HNP (herniated nucleus pulposus), lumbar     PAIN IN LOWER BACK AND PAIN DOWN BOTH LEGS -12-24-14 radiates down right leg presently.  . Sleep apnea     HAS CPAP-DOES NOT USE-DOES NOT KNOW SETTINGS   Past Surgical History  Procedure Laterality Date  . 01/05/2011 lumbar decompression and microdiscectomy    . Cervical fusion  c4-5     . Pilonidal cyst excision    . Lumbar laminectomy/decompression microdiscectomy  07/26/2011    Procedure: LUMBAR LAMINECTOMY/DECOMPRESSION MICRODISCECTOMY;  Surgeon: Javier DockerJeffrey C Beane, MD;  Location: [facility] ORS;  Service: Orthopedics;  Laterality: Right;   Re-Do Lumbar Decompression L5-S1 on Right,   . Esophagogastroduodenoscopy (egd) with propofol N/A 12/30/2014    Procedure: ESOPHAGOGASTRODUODENOSCOPY (EGD) WITH PROPOFOL;  Surgeon: Willis ModenaWilliam Outlaw, MD;  Location: [facility] ENDOSCOPY;  Service: Endoscopy;  Laterality: N/A;  With Bravo   Family History  Problem Relation Age of Onset  . COPD Father   . Congestive Heart Failure Father   . Breast cancer Mother    Social History  Substance Use Topics  . Smoking status: Former Smoker -- 1.00 packs/day for 20 years    Types: Cigarettes    Quit date: 10/05/2011  . Smokeless tobacco: Former NeurosurgeonUser    Types: Chew  . Alcohol Use: 0.0 oz/week    0 Standard drinks or equivalent per week     Comment: 2 MIXED DRINKS (VODKA) DAILY-usually     Review of Systems  A complete 10 system review of systems was obtained and all systems are negative except as noted in the HPI and PMH.   Allergies  Other  Home Medications   Prior to Admission medications   Medication Sig Start Date End Date Taking? Authorizing Provider  acetaminophen (TYLENOL) 500 MG tablet Take 1,000 mg by mouth every 8 (eight) hours as needed for mild pain.    Yes Historical Provider, MD  cetirizine (ZYRTEC) 10 MG tablet Take 10 mg by mouth 2 (two) times daily.    Yes Historical Provider, MD  cyclobenzaprine (FLEXERIL) 10 MG tablet Take 10 mg by mouth 3 (three) times daily as needed for muscle spasms.  Yes Historical Provider, MD  DULoxetine (CYMBALTA) 60 MG capsule Take 60 mg by mouth at bedtime and may repeat dose one time if needed.    Yes Historical Provider, MD  fluticasone (FLONASE) 50 MCG/ACT nasal spray Place 2 sprays into both nostrils daily.   Yes Historical Provider, MD  gabapentin (NEURONTIN) 300 MG capsule Take 600 mg by mouth 3 (three) times daily.   Yes Historical Provider, MD  ibuprofen (ADVIL,MOTRIN) 800 MG tablet Take 800 mg by mouth every 8 (eight) hours as needed for mild pain or moderate pain.    Yes Historical Provider, MD   losartan (COZAAR) 100 MG tablet Take 100 mg by mouth every morning.    Yes Historical Provider, MD  montelukast (SINGULAIR) 10 MG tablet TAKE ONE TABLET BY MOUTH ONCE DAILY 01/27/15  Yes Lupita Leash, MD  pantoprazole (PROTONIX) 40 MG tablet Take 40 mg by mouth 2 (two) times daily.    Yes Historical Provider, MD  predniSONE (STERAPRED UNI-PAK 21 TAB) 10 MG (21) TBPK tablet Take 10 mg by mouth daily. As directed for 6 days.   Yes Historical Provider, MD  ranitidine (ZANTAC) 150 MG capsule Take 150 mg by mouth 2 (two) times daily.   Yes Historical Provider, MD  tapentadol (NUCYNTA) 50 MG TABS tablet Take 50 mg by mouth 3 (three) times daily as needed for moderate pain.    Yes Historical Provider, MD   Triage Vitals: BP 144/77 mmHg  Pulse 120  Temp(Src) 98.6 F (37 C) (Oral)  Resp 20  SpO2 95% Physical Exam  Constitutional: He is oriented to person, place, and time. He appears well-developed and well-nourished.  HENT:  Head: Normocephalic and atraumatic.  Eyes: Conjunctivae and EOM are normal.  Neck: Normal range of motion.  Cardiovascular: Normal rate, regular rhythm and intact distal pulses.   Pulmonary/Chest: Effort normal.  Abdominal: Soft. Bowel sounds are normal. There is no tenderness.  Musculoskeletal: Normal range of motion.  Neurological: He is alert and oriented to person, place, and time.  No point tenderness to percussion of lumbar spinal processes.  No TTP or paraspinal muscular spasm. Strength is 5 out of 5 to bilateral lower extremities at hip and knee; extensor hallucis longus 5 out of 5. Ankle strength 5 out of 5, no clonus, neurovascularly intact. No saddle anaesthesia. Patellar reflexes are 2+ bilaterally.    SLR + on right at 45 degrees negative on left   Skin: Skin is warm and dry.  Psychiatric: He has a normal mood and affect. His behavior is normal.  Nursing note and vitals reviewed.   ED Course  Procedures (including critical care time) DIAGNOSTIC  STUDIES: Oxygen Saturation is 95% on RA, adequate by my interpretation.   COORDINATION OF CARE: 6:52 PM- Will order IV Dilaudid an injection of Valium. Advised pt to follow up with Dr. Ethelene Hal. Pt verbalizes understanding and agrees to plan.  Medications  HYDROmorphone (DILAUDID) injection 1 mg (not administered)  diazepam (VALIUM) injection 5 mg (not administered)    MDM   Final diagnoses:  Acute exacerbation of chronic low back pain    Filed Vitals:   03/09/15 1636 03/09/15 1934  BP: 144/77 144/87  Pulse: 120 121  Temp: 98.6 F (37 C) 99.5 F (37.5 C)  TempSrc: Oral Oral  Resp: 20 20  SpO2: 95% 95%    Medications  dexamethasone (DECADRON) injection 10 mg (not administered)  HYDROmorphone (DILAUDID) injection 1 mg (1 mg Intravenous Given 03/09/15 1903)  diazepam (VALIUM) injection 5  mg (5 mg Intravenous Given 03/09/15 1903)  HYDROcodone-acetaminophen (NORCO/VICODIN) 5-325 MG per tablet 2 tablet (2 tablets Oral Given 03/09/15 1935)    Hendricks Schwandt is 45 y.o. male presenting with atraumatic exacerbation of his chronic low back pain, patient had instrumentation at the beginning of December. No overlying skin changes, no focal tenderness to percussion to suggest focal infection. Patient is given Dilaudid and Valium IV, he was able to ambulate but only with assistance of a cane, he is a very large gentleman and does not feel comfortable going home. Patient will be given Decadron and Vicodin and will try a another trial of ambulation. Case signed out to NP [place] at shift change.     Wynetta Emery, [name] 03/09/15 2004

## 2015-03-09 NOTE — Discharge Instructions (Signed)
Take valium and/or percocet for breakthrough pain, do not drink alcohol, drive, care for children or perfom other critical tasks while taking valium and/or percocet.  Please follow with your primary care doctor in the next 2 days for a check-up. They must obtain records for further management.   Do not hesitate to return to the Emergency Department for any new, worsening or concerning symptoms.     Back Pain, Adult Back pain is very common in adults.The cause of back pain is rarely dangerous and the pain often gets better over time.The cause of your back pain may not be known. Some common causes of back pain include:  Strain of the muscles or ligaments supporting the spine.  Wear and tear (degeneration) of the spinal disks.  Arthritis.  Direct injury to the back. For many people, back pain may return. Since back pain is rarely dangerous, most people can learn to manage this condition on their own. HOME CARE INSTRUCTIONS Watch your back pain for any changes. The following actions may help to lessen any discomfort you are feeling:  Remain active. It is stressful on your back to sit or stand in one place for long periods of time. Do not sit, drive, or stand in one place for more than 30 minutes at a time. Take short walks on even surfaces as soon as you are able.Try to increase the length of time you walk each day.  Exercise regularly as directed by your health care provider. Exercise helps your back heal faster. It also helps avoid future injury by keeping your muscles strong and flexible.  Do not stay in bed.Resting more than 1-2 days can delay your recovery.  Pay attention to your body when you bend and lift. The most comfortable positions are those that put less stress on your recovering back. Always use proper lifting techniques, including:  Bending your knees.  Keeping the load close to your body.  Avoiding twisting.  Find a comfortable position to sleep. Use a firm  mattress and lie on your side with your knees slightly bent. If you lie on your back, put a pillow under your knees.  Avoid feeling anxious or stressed.Stress increases muscle tension and can worsen back pain.It is important to recognize when you are anxious or stressed and learn ways to manage it, such as with exercise.  Take medicines only as directed by your health care provider. Over-the-counter medicines to reduce pain and inflammation are often the most helpful.Your health care provider may prescribe muscle relaxant drugs.These medicines help dull your pain so you can more quickly return to your normal activities and healthy exercise.  Apply ice to the injured area:  Put ice in a plastic bag.  Place a towel between your skin and the bag.  Leave the ice on for 20 minutes, 2-3 times a day for the first 2-3 days. After that, ice and heat may be alternated to reduce pain and spasms.  Maintain a healthy weight. Excess weight puts extra stress on your back and makes it difficult to maintain good posture. SEEK MEDICAL CARE IF:  You have pain that is not relieved with rest or medicine.  You have increasing pain going down into the legs or buttocks.  You have pain that does not improve in one week.  You have night pain.  You lose weight.  You have a fever or chills. SEEK IMMEDIATE MEDICAL CARE IF:   You develop new bowel or bladder control problems.  You have unusual weakness  or numbness in your arms or legs.  You develop nausea or vomiting.  You develop abdominal pain.  You feel faint.   This information is not intended to replace advice given to you by your health care provider. Make sure you discuss any questions you have with your health care provider.   Document Released: 02/20/2005 Document Revised: 03/13/2014 Document Reviewed: 06/24/2013 Elsevier Interactive Patient Education [employer]

## 2015-03-09 NOTE — ED Notes (Signed)
Pt ambulated in the hallway with cane and needed assistance from another staff member.

## 2015-03-09 NOTE — ED Notes (Signed)
PA at bedside for pt eval.

## 2015-03-10 LAB — I-STAT CHEM 8, ED
BUN: 31 mg/dL — AB (ref 6–20)
CHLORIDE: 105 mmol/L (ref 101–111)
Calcium, Ion: 1.16 mmol/L (ref 1.12–1.23)
Creatinine, Ser: 1 mg/dL (ref 0.61–1.24)
Glucose, Bld: 195 mg/dL — ABNORMAL HIGH (ref 65–99)
HEMATOCRIT: 48 % (ref 39.0–52.0)
Hemoglobin: 16.3 g/dL (ref 13.0–17.0)
POTASSIUM: 4.4 mmol/L (ref 3.5–5.1)
Sodium: 138 mmol/L (ref 135–145)
TCO2: 20 mmol/L (ref 0–100)

## 2015-03-10 MED ORDER — HYDROMORPHONE HCL 1 MG/ML IJ SOLN
0.5000 mg | Freq: Once | INTRAMUSCULAR | Status: AC
  Administered 2015-03-10: 0.5 mg via INTRAVENOUS
  Filled 2015-03-10: qty 1

## 2015-03-10 NOTE — ED Notes (Signed)
Pt's spouse reports that pt wants more pain medication. Notified Malee Grays.

## 2015-03-10 NOTE — ED Provider Notes (Signed)
Patient being evaluated for exacerbation of chronic back pain.  He has received several doses of pain medication, as well as valium and steroids.  Patient continued to have pain.  He was signed out to me awaiting response to additional pain control measures.  Toradol is being considered, but patient does not have recent renal function assessment.  I-stat 8 obtained, shows potassium of 6, normal creatinine, BUN 42 and Hbg of 17.7.  ECG obtained, no indication of hyperkalemic changes.  Given IV fluids with normalization of potassium. Pain somewhat improved. No concerning neurologic findings. Will discharge home with instructions and prescriptions per original provider.  Felicie Mornavid Printice Hellmer, NP 03/10/15 [phone]

## 2015-03-10 NOTE — ED Notes (Signed)
When patient attempts to sit up on stretcher, he reports it is more pain and HR increases to 128.

## 2015-04-01 ENCOUNTER — Encounter: Payer: Self-pay | Admitting: Pulmonary Disease

## 2015-04-01 ENCOUNTER — Ambulatory Visit (INDEPENDENT_AMBULATORY_CARE_PROVIDER_SITE_OTHER): Payer: BLUE CROSS/BLUE SHIELD | Admitting: Pulmonary Disease

## 2015-04-01 VITALS — BP 148/90 | HR 95 | Ht 70.0 in | Wt 290.0 lb

## 2015-04-01 DIAGNOSIS — R059 Cough, unspecified: Secondary | ICD-10-CM

## 2015-04-01 DIAGNOSIS — R05 Cough: Secondary | ICD-10-CM | POA: Diagnosis not present

## 2015-04-01 NOTE — Assessment & Plan Note (Signed)
This is a complicated problem.  He has GERD and post nasal drip but it is no clear that he needs a surgical or medical change for either of these. I would like to see the results of his Bravo probe.   There are medical options which we could use to treat his cough, specifically Elavil, pregabalin, or increasing the Neurontin. However, all of these have significant interactions with his current pain, depression, anxiety regimen. Because of this, I have asked him to consider the following options with his primary care physician:  The medical options for your cough are as follows: 1) Elavil 25-50mg  qHS  2) Increasing Neurontin to  tid 3) Changing Neurontin to Lyrica  Because of significant interactions between Elavil, neurontin and Nucynta I believe you need to discuss the management of your pain medications with your PCP prior to making any of these changes  If you still had cough after trying one of these options, then you could either undergo sinus surgery or enroll in a clinical trial.  We will see you back in 4-6 months or sooner if needed

## 2015-04-01 NOTE — Patient Instructions (Signed)
The medical options for your cough are as follows: 1) Elavil 25-50mg  qHS  2) Increasing Neurontin to  tid 3) Changing Neurontin to Lyrica  Because of significant interactions between Elavil, neurontin and Nucynta I believe you need to discuss the management of your pain medications with your PCP prior to making any of these changes  If you still had cough after trying one of these options, then you could either undergo sinus surgery or enroll in a clinical trial.  We will see you back in 4-6 months or sooner if needed

## 2015-04-01 NOTE — Progress Notes (Signed)
Subjective:    Patient ID: Ryan Holland, male    DOB: [birth year], 45 y.o.   MRN: [id number]  Synopsis: Referred for chronic refractory cough in 2016 after extensive evaluation by his primary care physician. Has allergic rhinitis and cough Pulmonary function testing in 2016 showed no evidence of lung disease, chest x-ray was normal CT scan of the sinuses showed ethmoid sinusitis  HPI Chief Complaint  Patient presents with  . Follow-up    pt states cough is still present.  cough is nonproductive.  also notes hoarseness.    He says that the Bravo test didn't show much in terms of his acid reflux. He continues ot have a dry, un productive cough.  He says that it remains severe to moderate in intensity.  He says that the cough actually caused him to have increased back pain recently. He is not producing any mucus. No dyspnea. He does note some hoarseness which he says is associated with a dry cough.   Past Medical History  Diagnosis Date  . Hypertension   . GERD (gastroesophageal reflux disease)     OCCAS-WOULD USE OTC MED IF NEEDED  . Anxiety   . HNP (herniated nucleus pulposus), lumbar     PAIN IN LOWER BACK AND PAIN DOWN BOTH LEGS -12-24-14 radiates down right leg presently.  . Sleep apnea     HAS CPAP-DOES NOT USE-DOES NOT KNOW SETTINGS      Review of Systems     Objective:   Physical Exam Filed Vitals:   04/01/15 1603  BP: 148/90  Pulse: 95  Height:  (1.778 m)  Weight: 290 lb (131.543 kg)  SpO2: 100%    Gen: well appearing HENT: OP clear, neck supple PULM: CTA B, normal percussion CV: RRR, no mgr, trace edema GI: BS+, soft, nontender Derm: no cyanosis or rash Psyche: normal mood and affect      Assessment & Plan:  Cough This is a complicated problem.  He has GERD and post nasal drip but it is no clear that he needs a surgical or medical change for either of these. I would like to see the results of his Bravo probe.   There are medical options  which we could use to treat his cough, specifically Elavil, pregabalin, or increasing the Neurontin. However, all of these have significant interactions with his current pain, depression, anxiety regimen. Because of this, I have asked him to consider the following options with his primary care physician:  The medical options for your cough are as follows: 1) Elavil 25-50mg  qHS  2) Increasing Neurontin to  tid 3) Changing Neurontin to Lyrica  Because of significant interactions between Elavil, neurontin and Nucynta I believe you need to discuss the management of your pain medications with your PCP prior to making any of these changes  If you still had cough after trying one of these options, then you could either undergo sinus surgery or enroll in a clinical trial.  We will see you back in 4-6 months or sooner if needed  > 50% of time spent face to face in a 25 minute visit   Current outpatient prescriptions:  .  acetaminophen (TYLENOL) 500 MG tablet, Take 1,000 mg by mouth every 8 (eight) hours as needed for mild pain. , Disp: , Rfl:  .  cetirizine (ZYRTEC) 10 MG tablet, Take 10 mg by mouth 2 (two) times daily. , Disp: , Rfl:  .  cyclobenzaprine (FLEXERIL) 10 MG tablet, Take 10 mg by  mouth 3 (three) times daily as needed for muscle spasms. , Disp: , Rfl:  .  diazepam (VALIUM) 5 MG tablet, Take 1 tablet (5 mg total) by mouth every 6 (six) hours as needed for anxiety (spasms)., Disp: 10 tablet, Rfl: 0 .  DULoxetine (CYMBALTA) 60 MG capsule, Take 60 mg by mouth at bedtime and may repeat dose one time if needed. , Disp: , Rfl:  .  fluticasone (FLONASE) 50 MCG/ACT nasal spray, Place 2 sprays into both nostrils daily., Disp: , Rfl:  .  HYDROmorphone (DILAUDID) 4 MG tablet, Take 4 mg by mouth every 6 (six) hours as needed for severe pain., Disp: , Rfl:  .  ibuprofen (ADVIL,MOTRIN) 800 MG tablet, Take 800 mg by mouth every 8 (eight) hours as needed for mild pain or moderate pain. , Disp: , Rfl:    .  losartan (COZAAR) 100 MG tablet, Take 100 mg by mouth every morning. , Disp: , Rfl:  .  montelukast (SINGULAIR) 10 MG tablet, TAKE ONE TABLET BY MOUTH ONCE DAILY, Disp: 30 tablet, Rfl: 5 .  oxyCODONE-acetaminophen (PERCOCET/ROXICET) 5-325 MG tablet, 1 to 2 tabs PO q6hrs  PRN for pain, Disp: 15 tablet, Rfl: 0 .  pantoprazole (PROTONIX) 40 MG tablet, Take 40 mg by mouth 2 (two) times daily. , Disp: , Rfl:  .  ranitidine (ZANTAC) 150 MG capsule, Take 150 mg by mouth 2 (two) times daily., Disp: , Rfl:  .  tapentadol (NUCYNTA) 50 MG TABS tablet, Take 50 mg by mouth 3 (three) times daily as needed for moderate pain. , Disp: , Rfl:

## 2015-04-22 DIAGNOSIS — M5127 Other intervertebral disc displacement, lumbosacral region: Secondary | ICD-10-CM | POA: Insufficient documentation

## 2015-05-05 DIAGNOSIS — Z9889 Other specified postprocedural states: Secondary | ICD-10-CM | POA: Insufficient documentation

## 2015-07-21 ENCOUNTER — Other Ambulatory Visit: Payer: Self-pay | Admitting: Pulmonary Disease

## 2015-08-17 ENCOUNTER — Ambulatory Visit: Payer: Self-pay | Admitting: Pulmonary Disease

## 2015-11-25 DIAGNOSIS — M79672 Pain in left foot: Secondary | ICD-10-CM | POA: Insufficient documentation

## 2015-11-25 DIAGNOSIS — M7662 Achilles tendinitis, left leg: Secondary | ICD-10-CM | POA: Insufficient documentation

## 2015-12-06 IMAGING — CT CT PARANASAL SINUSES LIMITED
1 series · 16 of 27 positions shown, 20 images · non-contrast
Comparison: None.

CLINICAL DATA: 44-year-old male chronic cough for 2 years.
Hypertension. Initial encounter.

EXAM:
CT PARANASAL SINUS LIMITED WITHOUT CONTRAST
TECHNIQUE: Non-contiguous multidetector CT images of the paranasal sinuses were
obtained in a single plane without contrast.

[Series 3: ltd sinuses 2.4 h40s · axial · 0.32mm/px · z∈[-128,-8]mm · 16 of 27 slices shown, 20 images]
[im 2/27  brain]
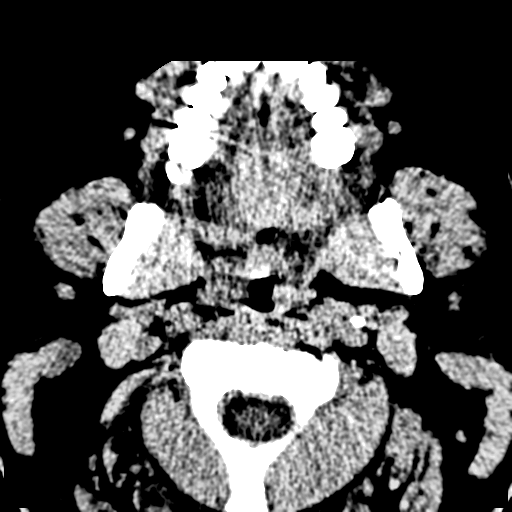
[im 2/27  bone]
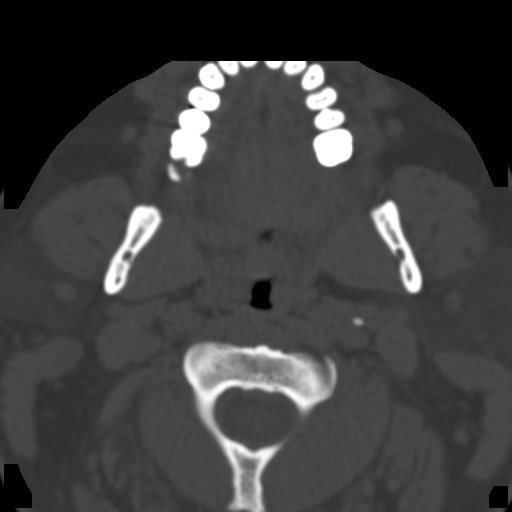
[im 4/27  bone]
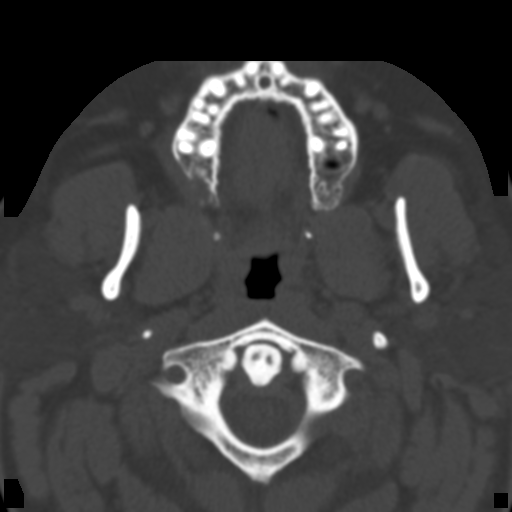
[im 5/27  bone]
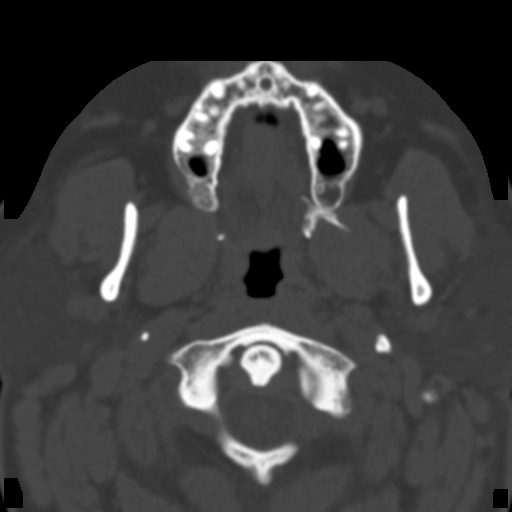
[im 7/27  bone]
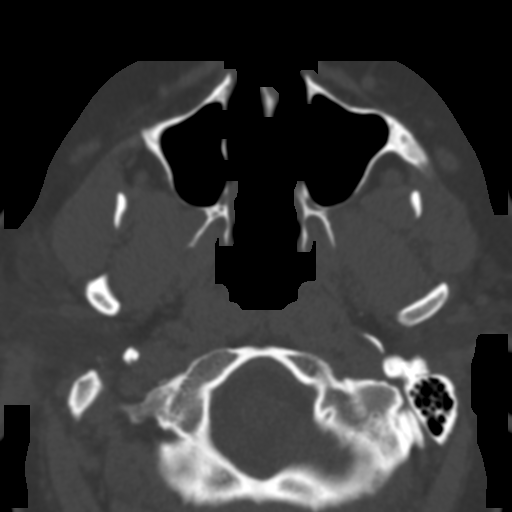
[im 9/27  brain]
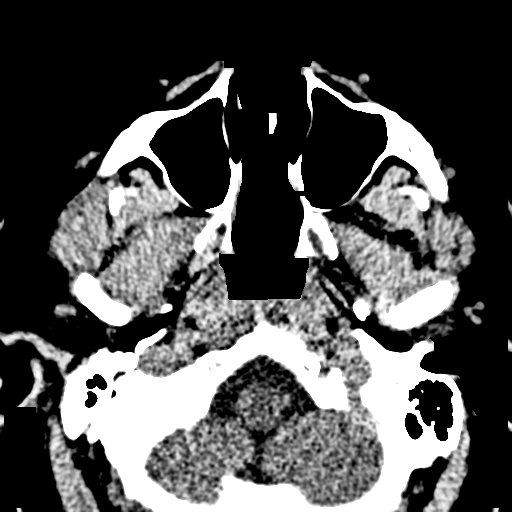
[im 9/27  bone]
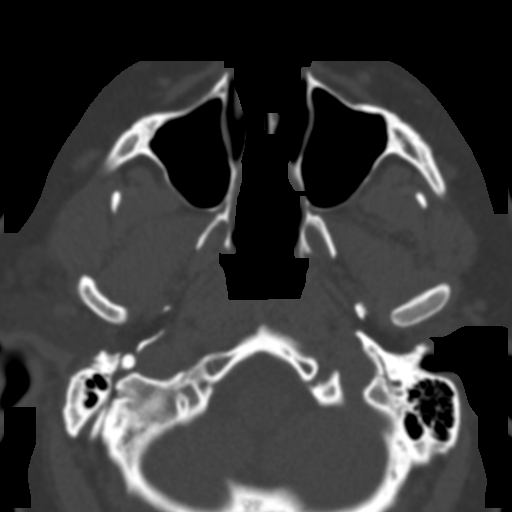
[im 10/27  bone]
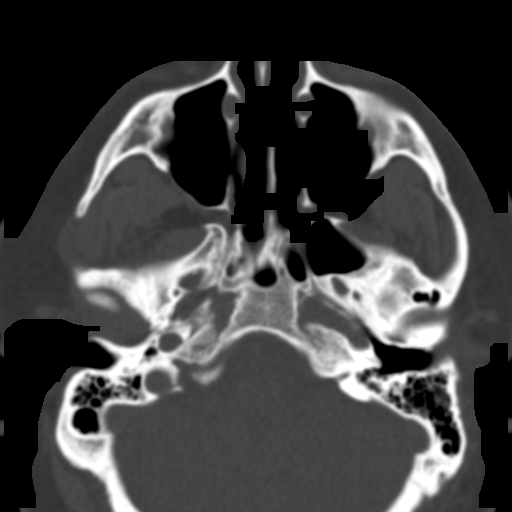
[im 12/27  bone]
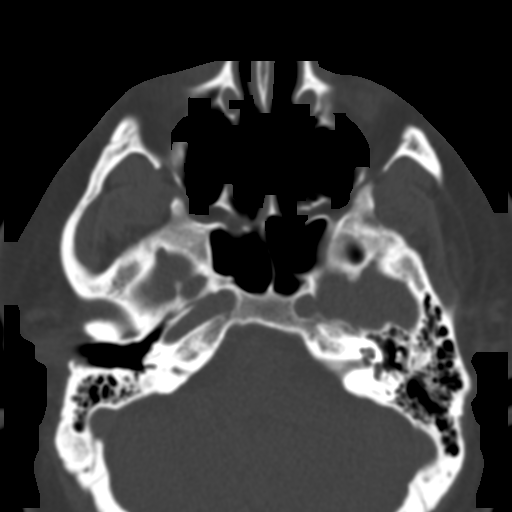
[im 13/27  bone]
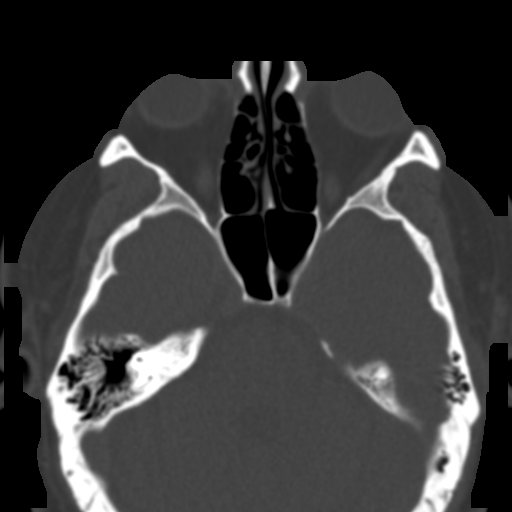
[im 15/27  brain]
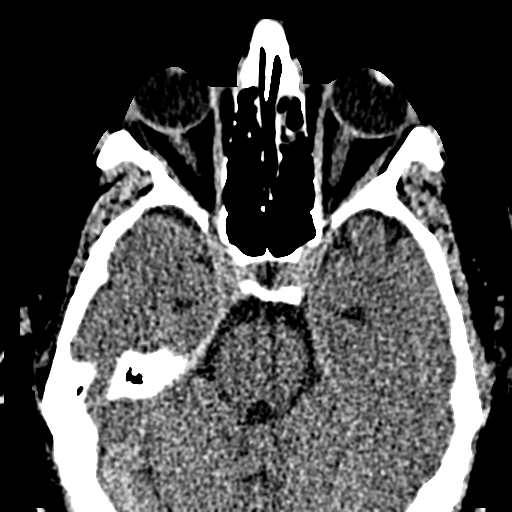
[im 15/27  bone]
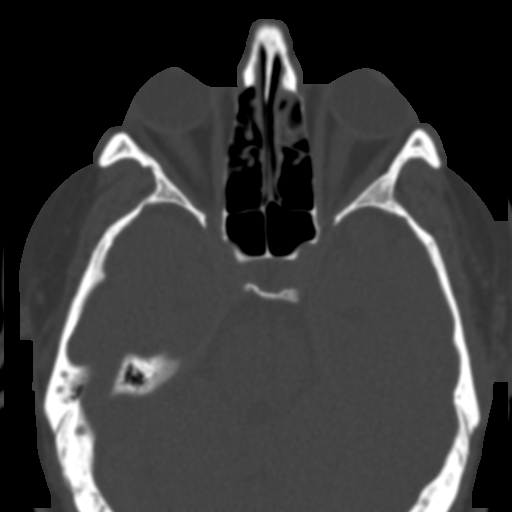
[im 16/27  bone]
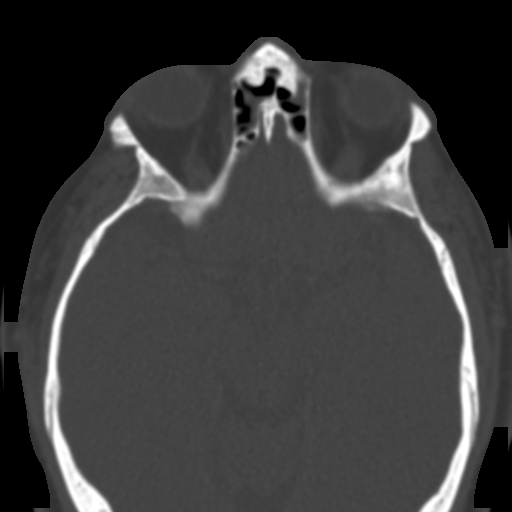
[im 18/27  bone]
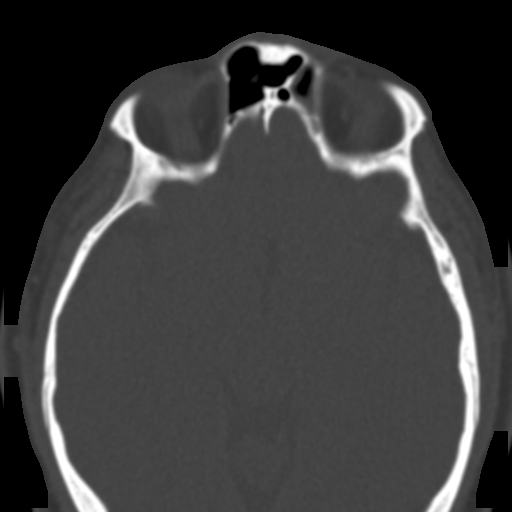
[im 19/27  bone]
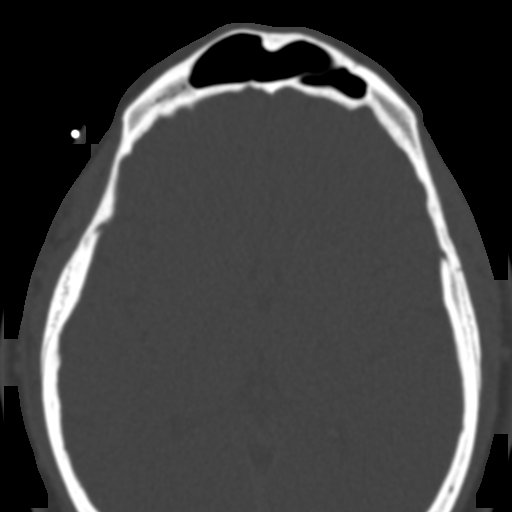
[im 21/27  brain]
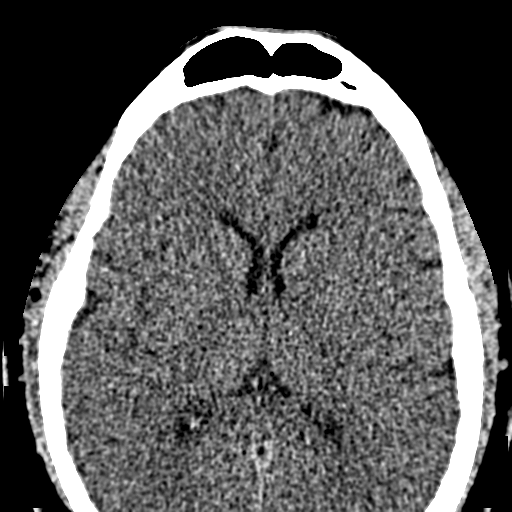
[im 21/27  bone]
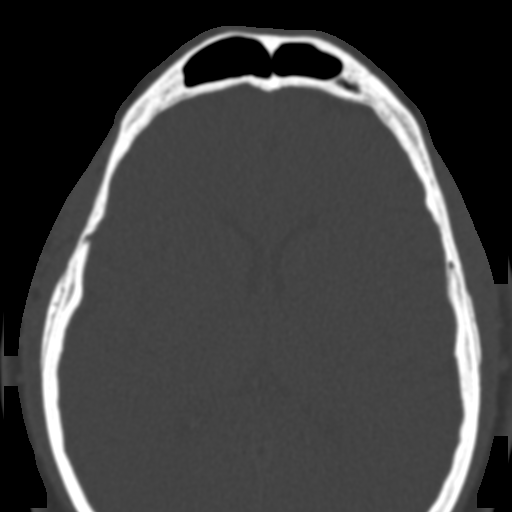
[im 23/27  bone]
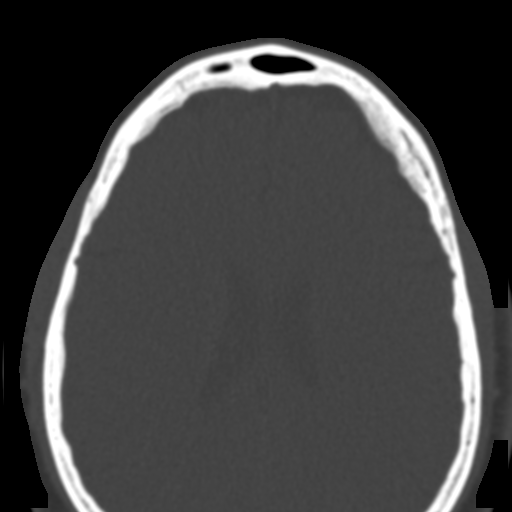
[im 24/27  bone]
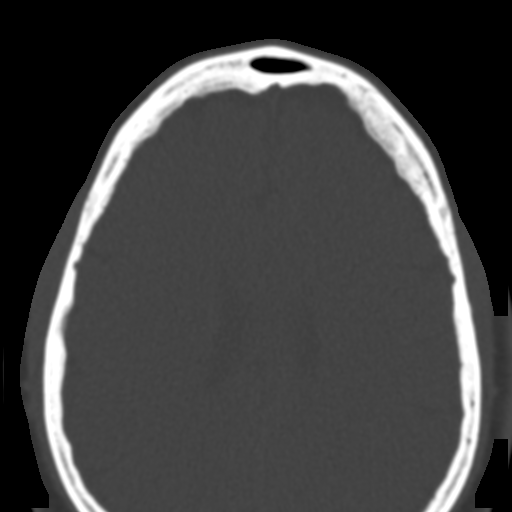
[im 26/27  bone]
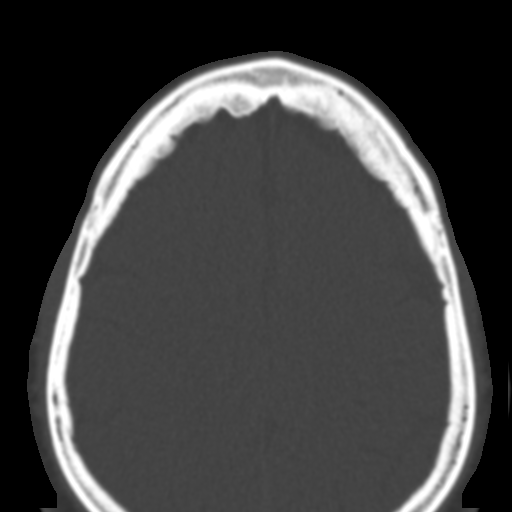

[16 of 27 positions shown; findings below may reference images not displayed]

FINDINGS: Mucosal thickening/ partial opacification mid to anterior ethmoid
sinus air cells greater on left otherwise visualized paranasal
sinuses, mastoid air cells and middle ear cavities are clear.

Exophthalmos.

Visualized intracranial structures unremarkable. The slight
asymmetry of the the floor of the sella is most likely a normal
variant.
IMPRESSION: Mucosal thickening/ partial opacification mid to anterior ethmoid
sinus air cells greater on left otherwise visualized paranasal
sinuses, mastoid air cells and middle ear cavities are clear.

Exophthalmos.

## 2016-10-14 DIAGNOSIS — I10 Essential (primary) hypertension: Secondary | ICD-10-CM | POA: Insufficient documentation

## 2017-11-08 DIAGNOSIS — G4733 Obstructive sleep apnea (adult) (pediatric): Secondary | ICD-10-CM | POA: Insufficient documentation

## 2017-11-08 DIAGNOSIS — F411 Generalized anxiety disorder: Secondary | ICD-10-CM | POA: Insufficient documentation

## 2017-11-08 DIAGNOSIS — K2100 Gastro-esophageal reflux disease with esophagitis, without bleeding: Secondary | ICD-10-CM | POA: Insufficient documentation

## 2017-11-08 DIAGNOSIS — Z6841 Body Mass Index (BMI) 40.0 and over, adult: Secondary | ICD-10-CM | POA: Insufficient documentation

## 2017-11-08 DIAGNOSIS — M7918 Myalgia, other site: Secondary | ICD-10-CM | POA: Insufficient documentation

## 2017-11-08 DIAGNOSIS — G5603 Carpal tunnel syndrome, bilateral upper limbs: Secondary | ICD-10-CM | POA: Insufficient documentation

## 2017-11-08 DIAGNOSIS — J3089 Other allergic rhinitis: Secondary | ICD-10-CM | POA: Insufficient documentation

## 2018-03-05 DIAGNOSIS — L03116 Cellulitis of left lower limb: Secondary | ICD-10-CM | POA: Insufficient documentation

## 2018-03-11 DIAGNOSIS — E872 Acidosis, unspecified: Secondary | ICD-10-CM | POA: Insufficient documentation

## 2019-01-14 DIAGNOSIS — M6788 Other specified disorders of synovium and tendon, other site: Secondary | ICD-10-CM | POA: Insufficient documentation

## 2019-01-14 DIAGNOSIS — M7661 Achilles tendinitis, right leg: Secondary | ICD-10-CM | POA: Insufficient documentation

## 2019-10-27 DIAGNOSIS — R7989 Other specified abnormal findings of blood chemistry: Secondary | ICD-10-CM | POA: Insufficient documentation

## 2019-12-02 ENCOUNTER — Other Ambulatory Visit (HOSPITAL_COMMUNITY): Payer: Self-pay | Admitting: Family Medicine

## 2020-04-09 MED FILL — LOSARTAN POTASSIUM 100 MG T: 100 | 30 days supply | Qty: 30 | Fill #0

## 2020-04-14 ENCOUNTER — Other Ambulatory Visit: Payer: Self-pay | Admitting: Family Medicine

## 2020-04-14 ENCOUNTER — Ambulatory Visit (INDEPENDENT_AMBULATORY_CARE_PROVIDER_SITE_OTHER): Payer: No Typology Code available for payment source | Admitting: Family Medicine

## 2020-04-14 ENCOUNTER — Other Ambulatory Visit: Payer: Self-pay

## 2020-04-14 ENCOUNTER — Encounter: Payer: Self-pay | Admitting: Family Medicine

## 2020-04-14 VITALS — BP 146/80 | HR 94 | Temp 98.3°F | Ht 69.5 in | Wt 252.4 lb

## 2020-04-14 DIAGNOSIS — E291 Testicular hypofunction: Secondary | ICD-10-CM | POA: Diagnosis not present

## 2020-04-14 DIAGNOSIS — Z5181 Encounter for therapeutic drug level monitoring: Secondary | ICD-10-CM

## 2020-04-14 DIAGNOSIS — M5416 Radiculopathy, lumbar region: Secondary | ICD-10-CM

## 2020-04-14 DIAGNOSIS — G8929 Other chronic pain: Secondary | ICD-10-CM

## 2020-04-14 DIAGNOSIS — M5442 Lumbago with sciatica, left side: Secondary | ICD-10-CM | POA: Diagnosis not present

## 2020-04-14 DIAGNOSIS — E559 Vitamin D deficiency, unspecified: Secondary | ICD-10-CM

## 2020-04-14 MED ORDER — TESTOSTERONE CYPIONATE 200 MG/ML IM SOLN: 200.0000 mg | INTRAMUSCULAR | 1 refills | Status: DC

## 2020-04-14 MED ORDER — VITAMIN D (ERGOCALCIFEROL) 1.25 MG (50000 UNIT) PO CAPS: 50000.0000 [IU] | ORAL_CAPSULE | ORAL | 1 refills | Status: DC

## 2020-04-14 MED ORDER — METHOCARBAMOL 500 MG PO TABS: 500.0000 mg | ORAL_TABLET | Freq: Four times a day (QID) | ORAL | 1 refills | Status: DC | PRN

## 2020-04-14 MED ORDER — OXYCODONE-ACETAMINOPHEN 5-325 MG PO TABS: 1.0000 | ORAL_TABLET | Freq: Three times a day (TID) | ORAL | 0 refills | Status: DC | PRN

## 2020-04-14 NOTE — Patient Instructions (Addendum)
   Lab only visit within the next 1 week, follow-up with me in 1 month.  I did refill Robaxin and oxycodone temporarily until you are able to see Dr. Ethelene Hal.   I refilled testosterone at same dose for now, and vitamin D. Let me know if there are questions.   If you have lab work done today you will be contacted with your lab results within the next 2 weeks.  If you have not heard from [place] then please contact us. The fastest way to get your results is to register for My Chart.   IF you received an x-ray today, you will receive an invoice from [facility]. Please contact [facility] at [phone] with questions or concerns regarding your invoice.   IF you received labwork today, you will receive an invoice from [place]. Please contact [facility] at [phone] with questions or concerns regarding your invoice.   Our billing staff will not be able to assist you with questions regarding bills from these companies.  You will be contacted with the lab results as soon as they are available. The fastest way to get your results is to activate your My Chart account. Instructions are located on the last page of this paperwork. If you have not heard from [place] regarding the results in 2 weeks, please contact this office.

## 2020-04-14 NOTE — Progress Notes (Signed)
Subjective:  Patient ID: Ryan Holland, male    DOB: October 07, 1970  Age: 50 y.o. MRN: [id number]  CC:  Chief Complaint  Patient presents with  . Pain Management    Referral to get a nerve block done.  Due to left leg pain then rise up to back at times.   . Establish Care    Willing to follow provider     HPI Ryan Holland presents for   New patient to me. Recent change in insurance. Previous primary care provider Dr. Delano Metz.   Left leg/back pain: Here for referral to pain management, is interested in having nerve block. Appears he has been seen through [facility]. Has been seen by Dr. Desiree Hane 01/27/2020, ongoing left lower extremity weakness noted per that note. 3 previous surgeries in 2013, 2015, 2017. Nerve conduction study 01/26/2020. Left acute lumbosacral radiculopathy at the level of L5. This correlated with the patient's symptoms of left foot drop. MRI of the lumbosacral region was ordered.  MRI lumbar spine 02/02/2020. Prior right hemilaminectomy at L5-S1, multilevel degenerative disc disease and facet arthrosis. Most notable for moderate spinal canal narrowing and bilateral mild neuroforaminal stenosis, more pronounced on the left at L3-4, mild to moderate left neuroforaminal stenosis and mild spinal canal/lateral recess narrowing L4-5.  Had been referred to Dr. Weldon Inches for injection, but with insurance change - needs new provider. Occasional use of oxycodone 5/325mg  if increased pain - takes 1/2 pill. Initially tries muscle relaxer - robaxin - would like refill as well. Only has 1 pill left. Controlled substance database (PDMP) reviewed. No concerns appreciated.  Testosterone 200 mg 02/24/2020, 01/20/2020, 12/18/2019, 11/13/2019, 09/12/2019, 4/16/202.  oxycodone 5/325 #30 on 06/20/2019  Also needs other refills:  Testosterone injection every 2 weeks.  Rx by prior PCP for hypogonadism.  Labs in November, testosterone 459 01/27/19. Hemoglobin 17.2 on  10/27/2019.   Vitamin D deficiency: Takes 50,000 units weekly.          History Patient Active Problem List   Diagnosis Date Noted  . Cough 08/25/2014  . Acid reflux 08/25/2014  . Allergic rhinitis 08/25/2014   Past Medical History:  Diagnosis Date  . Anxiety   . GERD (gastroesophageal reflux disease)    OCCAS-WOULD USE OTC MED IF NEEDED  . HNP (herniated nucleus pulposus), lumbar    PAIN IN LOWER BACK AND PAIN DOWN BOTH LEGS -12-24-14 radiates down right leg presently.  Marland Kitchen Hypertension   . Sleep apnea    HAS CPAP-DOES NOT USE-DOES NOT KNOW SETTINGS   Past Surgical History:  Procedure Laterality Date  . 01/05/2011 LUMBAR DECOMPRESSION AND MICRODISCECTOMY    . CERVICAL FUSION  C4-5     . ESOPHAGOGASTRODUODENOSCOPY (EGD) WITH PROPOFOL N/A 12/30/2014   Procedure: ESOPHAGOGASTRODUODENOSCOPY (EGD) WITH PROPOFOL;  Surgeon: Willis Modena, MD;  Location: [facility];  Service: Endoscopy;  Laterality: N/A;  With Bravo  . LUMBAR LAMINECTOMY/DECOMPRESSION MICRODISCECTOMY  07/26/2011   Procedure: LUMBAR LAMINECTOMY/DECOMPRESSION MICRODISCECTOMY;  Surgeon: Javier Docker, MD;  Location: [facility];  Service: Orthopedics;  Laterality: Right;  Re-Do Lumbar Decompression L5-S1 on Right,   . PILONIDAL CYST EXCISION     Allergies  Allergen Reactions  . Other     LEMON-sores in mouth   Prior to Admission medications   Medication Sig Start Date End Date Taking? Authorizing Provider  acetaminophen (TYLENOL) 500 MG tablet Take 1,000 mg by mouth every 8 (eight) hours as needed for mild pain.    Yes [provider]  cetirizine (ZYRTEC) 10  MG tablet Take 10 mg by mouth 2 (two) times daily.    Yes [provider]  citalopram (CELEXA) 40 MG tablet Take 40 mg by mouth daily. 03/24/20  Yes [provider]  clobetasol ointment (TEMOVATE) 0.05 % Apply topically. 06/26/19 06/25/20 Yes [provider]  cyclobenzaprine (FLEXERIL) 10 MG tablet Take 10 mg by mouth 3  (three) times daily as needed for muscle spasms.    Yes [provider]  fluticasone (FLONASE) 50 MCG/ACT nasal spray Place 2 sprays into both nostrils daily.   Yes [provider]  losartan (COZAAR) 100 MG tablet Take 1 tablet by mouth daily. 12/02/19  Yes [provider]  montelukast (SINGULAIR) 10 MG tablet TAKE ONE TABLET BY MOUTH ONCE DAILY 07/21/15  Yes McQuaid, Brooke Pace, MD  NEEDLE, DISP, 18 G (BD DISP NEEDLES) 18G X 1-1/2" MISC See admin instructions. 03/11/19  Yes [provider]  NEEDLE, DISP, 23 G 23G X 1" MISC See admin instructions. 10/05/17  Yes [provider]  pantoprazole (PROTONIX) 40 MG tablet Take 40 mg by mouth 2 (two) times daily.    Yes [provider]  Semaglutide-Weight Management (WEGOVY) 2.4 MG/0.75ML SOAJ Inject into the skin. 03/21/20  Yes [provider]  testosterone cypionate (DEPOTESTOSTERONE CYPIONATE) 200 MG/ML injection Inject 200 mg into the muscle every 14 (fourteen) days. 02/24/20  Yes [provider]  Vitamin D, Ergocalciferol, (DRISDOL) 1.25 MG (50000 UNIT) CAPS capsule Take 50,000 Units by mouth every 7 (seven) days.   Yes [provider]  diazepam (VALIUM) 5 MG tablet Take 1 tablet (5 mg total) by mouth every 6 (six) hours as needed for anxiety (spasms). Patient not taking: Reported on 04/14/2020 03/09/15   Pisciotta, Joni Reining, [name]   Social History   Socioeconomic History  . Marital status: Married    Spouse name: Not on file  . Number of children: Not on file  . Years of education: Not on file  . Highest education level: Not on file  Occupational History  . Not on file  Tobacco Use  . Smoking status: Former Smoker    Packs/day: 1.00    Years: 20.00    Pack years: 20.00    Types: Cigarettes    Quit date: 10/05/2011    Years since quitting: 8.5  . Smokeless tobacco: Former Neurosurgeon    Types: Chew  Substance and Sexual Activity  . Alcohol use: Yes    Alcohol/week: 0.0 standard  drinks    Comment: 2 MIXED DRINKS (VODKA) DAILY-usually   . Drug use: No  . Sexual activity: Not on file  Other Topics Concern  . Not on file  Social History Narrative  . Not on file   Social Determinants of Health   Financial Resource Strain: Not on file  Food Insecurity: Not on file  Transportation Needs: Not on file  Physical Activity: Not on file  Stress: Not on file  Social Connections: Not on file  Intimate Partner Violence: Not on file    Review of Systems  Per HPI.  Objective:   Vitals:   04/14/20 1635  BP: (!) 146/80  Pulse: 94  Temp: 98.3 F (36.8 C)  TempSrc: Temporal  SpO2: 100%  Weight: 252 lb 6.4 oz (114.5 kg)  Height: 5' 9.5" (1.765 m)     Physical Exam Constitutional:      General: He is not in acute distress.    Appearance: He is well-developed and well-nourished.  HENT:     Head:  Normocephalic and atraumatic.  Cardiovascular:     Rate and Rhythm: Normal rate.  Pulmonary:     Effort: Pulmonary effort is normal.  Musculoskeletal:     Comments: Ambulates without difficulty, negative seated straight leg raise.  Grossly equal strength lower extremities bilaterally with dorsi and plantar flexion intact.  Neurological:     Mental Status: He is alert and oriented to person, place, and time.  Psychiatric:        Mood and Affect: Mood and affect normal.     33 minutes spent during visit, greater than 50% counseling and assimilation of information, chart review, and discussion of plan.    Assessment & Plan:  Ryan Holland is a 50 y.o. male . Lumbar radiculopathy - Plan: Ambulatory referral to Physical Medicine Rehab, oxyCODONE-acetaminophen (PERCOCET/ROXICET) 5-325 MG tablet Chronic low back pain with left-sided sciatica, unspecified back pain laterality - Plan: methocarbamol (ROBAXIN) 500 MG tablet  -Longstanding symptoms with L5 radiculopathy noted on nerve conduction studies last year.  Will refer to Dr. Ethelene Hal locally to consider injection.   Agreed on temporary refill on oxycodone as infrequent use as well as muscle relaxant.  Hypogonadism male - Plan: Comprehensive metabolic panel, Lipid panel, CBC, PSA, Testosterone, testosterone cypionate (DEPOTESTOSTERONE CYPIONATE) 200 MG/ML injection Medication monitoring encounter - Plan: Comprehensive metabolic panel, Lipid panel, CBC, PSA  -Testosterone refilled.  Plan for lab only visit in the morning and next week for updated labs including testosterone level and other monitoring labs as above.  Vitamin D deficiency - Plan: Vitamin D, Ergocalciferol, (DRISDOL) 1.25 MG (50000 UNIT) CAPS capsule, Vitamin D, 25-hydroxy  -Check level on current supplementation, refilled prescription dose for now.  Recheck next month to review other chronic medical history.   Meds ordered this encounter  Medications  . methocarbamol (ROBAXIN) 500 MG tablet    Sig: Take 1 tablet (500 mg total) by mouth every 6 (six) hours as needed for muscle spasms.    Dispense:  30 tablet    Refill:  1  . oxyCODONE-acetaminophen (PERCOCET/ROXICET) 5-325 MG tablet    Sig: Take 1 tablet by mouth every 8 (eight) hours as needed for severe pain.    Dispense:  15 tablet    Refill:  0  . testosterone cypionate (DEPOTESTOSTERONE CYPIONATE) 200 MG/ML injection    Sig: Inject 1 mL (200 mg total) into the muscle every 14 (fourteen) days.    Dispense:  6 mL    Refill:  1  . Vitamin D, Ergocalciferol, (DRISDOL) 1.25 MG (50000 UNIT) CAPS capsule    Sig: Take 1 capsule (50,000 Units total) by mouth every 7 (seven) days.    Dispense:  5 capsule    Refill:  1   Patient Instructions     Lab only visit within the next 1 week, follow-up with me in 1 month.  I did refill Robaxin and oxycodone temporarily until you are able to see Dr. Ethelene Hal.   I refilled testosterone at same dose for now, and vitamin D. Let me know if there are questions.   If you have lab work done today you will be contacted with your lab results within the  next 2 weeks.  If you have not heard from [place] then please contact us. The fastest way to get your results is to register for My Chart.   IF you received an x-ray today, you will receive an invoice from [facility]. Please contact [facility] at [phone] with questions or concerns regarding your invoice.  IF you received labwork today, you will receive an invoice from [place]. Please contact [facility] at [phone] with questions or concerns regarding your invoice.   Our billing staff will not be able to assist you with questions regarding bills from these companies.  You will be contacted with the lab results as soon as they are available. The fastest way to get your results is to activate your My Chart account. Instructions are located on the last page of this paperwork. If you have not heard from [place] regarding the results in 2 weeks, please contact this office.          Signed, Merri Ray, MD [facility]

## 2020-04-15 MED FILL — METHOCARBAMOL 500 MG TABS: 500 | 7 days supply | Qty: 30 | Fill #0

## 2020-04-15 MED FILL — OXYCODONE-APAP 5-325MG: 5-325 | 5 days supply | Qty: 15 | Fill #0

## 2020-04-15 MED FILL — VIT D2 1.25 MG (50,000 UNIT: 1.25 MG | 28 days supply | Qty: 4 | Fill #0

## 2020-04-16 ENCOUNTER — Ambulatory Visit: Payer: No Typology Code available for payment source

## 2020-04-19 ENCOUNTER — Other Ambulatory Visit: Payer: Self-pay

## 2020-04-19 ENCOUNTER — Ambulatory Visit (INDEPENDENT_AMBULATORY_CARE_PROVIDER_SITE_OTHER): Payer: No Typology Code available for payment source | Admitting: Family Medicine

## 2020-04-19 DIAGNOSIS — E291 Testicular hypofunction: Secondary | ICD-10-CM

## 2020-04-19 DIAGNOSIS — Z5181 Encounter for therapeutic drug level monitoring: Secondary | ICD-10-CM

## 2020-04-19 DIAGNOSIS — E559 Vitamin D deficiency, unspecified: Secondary | ICD-10-CM

## 2020-04-19 LAB — COMPREHENSIVE METABOLIC PANEL

## 2020-04-19 LAB — LIPID PANEL

## 2020-04-19 LAB — CBC: Platelets: 223 10*3/uL (ref 150–450)

## 2020-04-20 LAB — TESTOSTERONE: Testosterone: 228 ng/dL — ABNORMAL LOW (ref 264–916)

## 2020-04-20 LAB — CBC
Hematocrit: 42.2 % (ref 37.5–51.0)
Hemoglobin: 14.7 g/dL (ref 13.0–17.7)
MCH: 34.6 pg — ABNORMAL HIGH (ref 26.6–33.0)
MCHC: 34.8 g/dL (ref 31.5–35.7)
MCV: 99 fL — ABNORMAL HIGH (ref 79–97)
RBC: 4.25 x10E6/uL (ref 4.14–5.80)
RDW: 12.1 % (ref 11.6–15.4)
WBC: 5.8 10*3/uL (ref 3.4–10.8)

## 2020-04-20 LAB — COMPREHENSIVE METABOLIC PANEL
ALT: 103 IU/L — ABNORMAL HIGH (ref 0–44)
AST: 83 IU/L — ABNORMAL HIGH (ref 0–40)
Albumin/Globulin Ratio: 2 (ref 1.2–2.2)
Albumin: 4.7 g/dL (ref 4.0–5.0)
Alkaline Phosphatase: 90 IU/L (ref 44–121)
BUN/Creatinine Ratio: 14 (ref 9–20)
BUN: 14 mg/dL (ref 6–24)
Bilirubin Total: 0.7 mg/dL (ref 0.0–1.2)
Calcium: 9.6 mg/dL (ref 8.7–10.2)
Chloride: 99 mmol/L (ref 96–106)
Creatinine, Ser: 0.98 mg/dL (ref 0.76–1.27)
GFR calc non Af Amer: 91 mL/min/{1.73_m2} (ref >59)
Globulin, Total: 2.3 g/dL (ref 1.5–4.5)
Glucose: 89 mg/dL (ref 65–99)
Potassium: 4.9 mmol/L (ref 3.5–5.2)
Sodium: 140 mmol/L (ref 134–144)

## 2020-04-20 LAB — LIPID PANEL: LDL Chol Calc (NIH): 110 mg/dL — ABNORMAL HIGH (ref 0–99)

## 2020-04-20 LAB — VITAMIN D 25 HYDROXY (VIT D DEFICIENCY, FRACTURES): Vit D, 25-Hydroxy: 25.7 ng/mL — ABNORMAL LOW (ref 30.0–100.0)

## 2020-04-20 LAB — PSA: Prostate Specific Ag, Serum: 0.8 ng/mL (ref 0.0–4.0)

## 2020-05-07 ENCOUNTER — Ambulatory Visit (INDEPENDENT_AMBULATORY_CARE_PROVIDER_SITE_OTHER): Payer: No Typology Code available for payment source | Admitting: Family Medicine

## 2020-05-07 ENCOUNTER — Other Ambulatory Visit: Payer: Self-pay | Admitting: Family Medicine

## 2020-05-07 ENCOUNTER — Encounter: Payer: Self-pay | Admitting: Family Medicine

## 2020-05-07 ENCOUNTER — Other Ambulatory Visit: Payer: Self-pay

## 2020-05-07 VITALS — BP 132/70 | HR 91 | Temp 98.3°F | Ht 70.0 in | Wt 249.0 lb

## 2020-05-07 DIAGNOSIS — J309 Allergic rhinitis, unspecified: Secondary | ICD-10-CM

## 2020-05-07 DIAGNOSIS — E291 Testicular hypofunction: Secondary | ICD-10-CM

## 2020-05-07 DIAGNOSIS — I10 Essential (primary) hypertension: Secondary | ICD-10-CM

## 2020-05-07 DIAGNOSIS — E785 Hyperlipidemia, unspecified: Secondary | ICD-10-CM

## 2020-05-07 DIAGNOSIS — K219 Gastro-esophageal reflux disease without esophagitis: Secondary | ICD-10-CM | POA: Diagnosis not present

## 2020-05-07 DIAGNOSIS — R7989 Other specified abnormal findings of blood chemistry: Secondary | ICD-10-CM

## 2020-05-07 DIAGNOSIS — E559 Vitamin D deficiency, unspecified: Secondary | ICD-10-CM

## 2020-05-07 MED ORDER — TESTOSTERONE CYPIONATE 200 MG/ML IM SOLN: 200.0000 mg | INTRAMUSCULAR | 1 refills | Status: DC

## 2020-05-07 MED ORDER — MONTELUKAST SODIUM 10 MG PO TABS: 10.0000 mg | ORAL_TABLET | Freq: Every day | ORAL | 3 refills | Status: DC

## 2020-05-07 MED ORDER — LOSARTAN POTASSIUM 100 MG PO TABS: 100.0000 mg | ORAL_TABLET | Freq: Every day | ORAL | 2 refills | Status: DC

## 2020-05-07 MED ORDER — PANTOPRAZOLE SODIUM 40 MG PO TBEC: 40.0000 mg | DELAYED_RELEASE_TABLET | Freq: Every day | ORAL | 3 refills | Status: DC

## 2020-05-07 MED FILL — MONTELUKAST SOD 10 MG TAB: 10 | 90 days supply | Qty: 90 | Fill #0

## 2020-05-07 MED FILL — LOSARTAN POTASSIUM 100 MG T: 100 | 30 days supply | Qty: 30 | Fill #0

## 2020-05-07 MED FILL — PANTOPRAZOLE SOD DR 40 MG T: 40 | 90 days supply | Qty: 90 | Fill #0

## 2020-05-07 NOTE — Progress Notes (Signed)
Subjective:  Patient ID: Ryan Holland, male    DOB: 01-15-1971  Age: 50 y.o. MRN: [id number]  CC:  Chief Complaint  Patient presents with  . Medical Management of Chronic Issues    Med concerns     HPI Ryan Holland presents for  Follow-up, had establish care visit with me February 9.  Hypertension: Losartan 100 mg daily. No new side effects of meds.  Home readings: none.  BP Readings from Last 3 Encounters:  05/07/20 132/70  04/14/20 (!) 146/80  04/01/15 (!) 148/90   Lab Results  Component Value Date   CREATININE 0.98 04/19/2020    Vitamin D deficiency Slight low readings last visit at 25, prescribed 50,000 units weekly.    Hypogonadism Testosterone injections every 2 weeks testosterone was 459 in November 2020, recently slightly low.  Plan for repeat testing mid injection. Was off injections for about a month.   GERD: protonix 40mg  qd. No hx of pud. Working well. Ran out few days ago.  Elevated LFTs Not currently on statin, had mild elevated lipids on recent testing with plan for recheck levels in 3 to 6 months.  Recent LFTs below.  Improved from June of last year but higher than August of last year.  Recommend repeat testing in 4 to 6 weeks. Thinks prior spike after heavier drinking.  Typical alcohol use - almost 2 liters vodka per week. Drinks at night - unknown.  No legal issues, no DUI.  Some feeling of addiction.  Has cut back at times.  Last 65 pounds since last January - on February for weight loss.   Lab Results  Component Value Date   ALT 103 (H) 04/19/2020   AST 83 (H) 04/19/2020   ALKPHOS 90 04/19/2020   BILITOT 0.7 04/19/2020     Results for orders placed or performed in visit on 04/19/20  Vitamin D, 25-hydroxy  Result Value Ref Range   Vit D, 25-Hydroxy 25.7 (L) 30.0 - 100.0 ng/mL  Testosterone  Result Value Ref Range   Testosterone 228 (L) 264 - 916 ng/dL  PSA  Result Value Ref Range   Prostate Specific Ag, Serum 0.8 0.0 - 4.0  ng/mL  CBC  Result Value Ref Range   WBC 5.8 3.4 - 10.8 x10E3/uL   RBC 4.25 4.14 - 5.80 x10E6/uL   Hemoglobin 14.7 13.0 - 17.7 g/dL   Hematocrit 04/21/20 27.0 - 51.0 %   MCV 99 (H) 79 - 97 fL   MCH 34.6 (H) 26.6 - 33.0 pg   MCHC 34.8 31.5 - 35.7 g/dL   RDW 62.3 76.2 - 83.1 %   Platelets 223 150 - 450 x10E3/uL  Lipid panel  Result Value Ref Range   Cholesterol, Total 214 (H) 100 - 199 mg/dL   Triglycerides 51.7 0 - 149 mg/dL   HDL 85 616 mg/dL   VLDL Cholesterol Cal 19 5 - 40 mg/dL   LDL Chol Calc (NIH) >07 (H) 0 - 99 mg/dL   Chol/HDL Ratio 2.5 0.0 - 5.0 ratio  Comprehensive metabolic panel  Result Value Ref Range   Glucose 89 65 - 99 mg/dL   BUN 14 6 - 24 mg/dL   Creatinine, Ser 371 0.76 - 1.27 mg/dL   GFR calc non Af Amer 91 >59 mL/min/1.73   GFR calc Af Amer 106 >59 mL/min/1.73   BUN/Creatinine Ratio 14 9 - 20   Sodium 140 134 - 144 mmol/L   Potassium 4.9 3.5 - 5.2 mmol/L   Chloride 99 96 -  106 mmol/L   CO2 22 20 - 29 mmol/L   Calcium 9.6 8.7 - 10.2 mg/dL   Total Protein 7.0 6.0 - 8.5 g/dL   Albumin 4.7 4.0 - 5.0 g/dL   Globulin, Total 2.3 1.5 - 4.5 g/dL   Albumin/Globulin Ratio 2.0 1.2 - 2.2   Bilirubin Total 0.7 0.0 - 1.2 mg/dL   Alkaline Phosphatase 90 44 - 121 IU/L   AST 83 (H) 0 - 40 IU/L   ALT 103 (H) 0 - 44 IU/L      History Patient Active Problem List   Diagnosis Date Noted  . Cough 08/25/2014  . Acid reflux 08/25/2014  . Allergic rhinitis 08/25/2014   Past Medical History:  Diagnosis Date  . Anxiety   . GERD (gastroesophageal reflux disease)    OCCAS-WOULD USE OTC MED IF NEEDED  . HNP (herniated nucleus pulposus), lumbar    PAIN IN LOWER BACK AND PAIN DOWN BOTH LEGS -12-24-14 radiates down right leg presently.  Marland Kitchen Hypertension   . Sleep apnea    HAS CPAP-DOES NOT USE-DOES NOT KNOW SETTINGS   Past Surgical History:  Procedure Laterality Date  . 01/05/2011 LUMBAR DECOMPRESSION AND MICRODISCECTOMY    . CERVICAL FUSION  C4-5     .  ESOPHAGOGASTRODUODENOSCOPY (EGD) WITH PROPOFOL N/A 12/30/2014   Procedure: ESOPHAGOGASTRODUODENOSCOPY (EGD) WITH PROPOFOL;  Surgeon: Willis Modena, MD;  Location: [facility];  Service: Endoscopy;  Laterality: N/A;  With Bravo  . LUMBAR LAMINECTOMY/DECOMPRESSION MICRODISCECTOMY  07/26/2011   Procedure: LUMBAR LAMINECTOMY/DECOMPRESSION MICRODISCECTOMY;  Surgeon: Javier Docker, MD;  Location: [facility];  Service: Orthopedics;  Laterality: Right;  Re-Do Lumbar Decompression L5-S1 on Right,   . PILONIDAL CYST EXCISION     Allergies  Allergen Reactions  . Other     LEMON-sores in mouth   Prior to Admission medications   Medication Sig Start Date End Date Taking? Authorizing Provider  cetirizine (ZYRTEC) 10 MG tablet Take 10 mg by mouth 2 (two) times daily.    Yes [provider]  citalopram (CELEXA) 40 MG tablet Take 40 mg by mouth daily. 03/24/20  Yes [provider]  clobetasol ointment (TEMOVATE) 0.05 % Apply topically. 06/26/19 06/25/20 Yes [provider]  fluticasone (FLONASE) 50 MCG/ACT nasal spray Place 2 sprays into both nostrils daily.   Yes [provider]  losartan (COZAAR) 100 MG tablet Take 1 tablet by mouth daily. 12/02/19  Yes [provider]  methocarbamol (ROBAXIN) 500 MG tablet Take 1 tablet (500 mg total) by mouth every 6 (six) hours as needed for muscle spasms. 04/14/20  Yes Shade Flood, MD  montelukast (SINGULAIR) 10 MG tablet TAKE ONE TABLET BY MOUTH ONCE DAILY 07/21/15  Yes Lupita Leash, MD  NEEDLE, DISP, 18 G (BD DISP NEEDLES) 18G X 1-1/2" MISC See admin instructions. 03/11/19  Yes [provider]  NEEDLE, DISP, 23 G 23G X 1" MISC See admin instructions. 10/05/17  Yes [provider]  oxyCODONE-acetaminophen (PERCOCET/ROXICET) 5-325 MG tablet Take 1 tablet by mouth every 8 (eight) hours as needed for severe pain. 04/14/20  Yes Shade Flood, MD  pantoprazole (PROTONIX) 40 MG tablet Take 40 mg by mouth 2 (two)  times daily.    Yes [provider]  Semaglutide-Weight Management (WEGOVY) 2.4 MG/0.75ML SOAJ Inject into the skin. 03/21/20  Yes [provider]  testosterone cypionate (DEPOTESTOSTERONE CYPIONATE) 200 MG/ML injection Inject 1 mL (200 mg total) into the muscle every 14 (fourteen) days. 04/14/20  Yes Shade Flood,  MD  Vitamin D, Ergocalciferol, (DRISDOL) 1.25 MG (50000 UNIT) CAPS capsule Take 1 capsule (50,000 Units total) by mouth every 7 (seven) days. 04/14/20  Yes Shade FloodGreene, Colleene Swarthout R, MD   Social History   Socioeconomic History  . Marital status: Married    Spouse name: Not on file  . Number of children: Not on file  . Years of education: Not on file  . Highest education level: Not on file  Occupational History  . Not on file  Tobacco Use  . Smoking status: Former Smoker    Packs/day: 1.00    Years: 20.00    Pack years: 20.00    Types: Cigarettes    Quit date: 10/05/2011    Years since quitting: 8.5  . Smokeless tobacco: Former NeurosurgeonUser    Types: Chew  Substance and Sexual Activity  . Alcohol use: Yes    Alcohol/week: 0.0 standard drinks    Comment: 2 MIXED DRINKS (VODKA) DAILY-usually   . Drug use: No  . Sexual activity: Not on file  Other Topics Concern  . Not on file  Social History Narrative  . Not on file   Social Determinants of Health   Financial Resource Strain: Not on file  Food Insecurity: Not on file  Transportation Needs: Not on file  Physical Activity: Not on file  Stress: Not on file  Social Connections: Not on file  Intimate Partner Violence: Not on file    Review of Systems  Per hpi  Objective:   Vitals:   05/07/20 1546 05/07/20 1602  BP: (!) 164/90 132/70  Pulse: 91   Temp: 98.3 F (36.8 C)   TempSrc: Temporal   SpO2: 96%   Weight: 249 lb (112.9 kg)   Height: 5\' 10"  (1.778 m)      Physical Exam Vitals reviewed.  Constitutional:      Appearance: He is well-developed and well-nourished.  HENT:     Head: Normocephalic  and atraumatic.  Eyes:     Extraocular Movements: EOM normal.     Pupils: Pupils are equal, round, and reactive to light.  Neck:     Vascular: No carotid bruit or JVD.  Cardiovascular:     Rate and Rhythm: Normal rate and regular rhythm.     Heart sounds: Normal heart sounds. No murmur heard.   Pulmonary:     Effort: Pulmonary effort is normal.     Breath sounds: Normal breath sounds. No rales.  Musculoskeletal:        General: No edema.  Skin:    General: Skin is warm and dry.  Neurological:     Mental Status: He is alert and oriented to person, place, and time.  Psychiatric:        Mood and Affect: Mood and affect normal.        Assessment & Plan:  Ryan JonesRobert Holland is a 50 y.o. male . Gastroesophageal reflux disease, unspecified whether esophagitis present - Plan: pantoprazole (PROTONIX) 40 MG tablet  -Continue Protonix, trigger avoidance.  Hypogonadism male - Plan: testosterone cypionate (DEPOTESTOSTERONE CYPIONATE) 200 MG/ML injection Hypogonadism in male  -Unsure why previous testosterone refill was not authorized but will attempt to order it again.  Low readings were due to being off medications at last visit.  Continue same dose with repeat testing at next visit.  Allergic rhinitis, unspecified seasonality, unspecified trigger - Plan: montelukast (SINGULAIR) 10 MG tablet  -Continue Singulair.  No other med changes  Essential hypertension - Plan: losartan (COZAAR) 100 MG tablet  -  Stable, continue same regimen  Vitamin D deficiency  -Borderline low on 50,000 unit dosing, can add over-the-counter supplement daily.  Elevated LFTs  -Likely related to alcohol use, recommended decreased amounts.  Resources provided if difficulty cutting back or feeling addicted to alcohol.  Hyperlipidemia  -Diet approach, handout given.  No orders of the defined types were placed in this encounter.  Patient Instructions     Ok to continue 50,000units per week vitamin D but can  also take over the counter  per day.   Try to cut back on alcohol as likely cause of elevated liver tests. Repeat liver tests next visit.  Resources below if difficulty cutting back.   Thanks for coming in today and let me know if there are any questions.   [facility]  Address: [address], [place], [place] [address]  Phone: [phone]  [facility]  Address: [address] [place], [place] [address]  Phone: [phone]  [facility] Address: [address] [place], [place], [place] [address]  Phone: [phone]    Preventing High Cholesterol Cholesterol is a white, waxy substance similar to fat that the human body needs to help build cells. The liver makes all the cholesterol that a person's body needs. Having high cholesterol (hypercholesterolemia) increases your risk for heart disease and stroke. Extra or excess cholesterol comes from the food that you eat. High cholesterol can often be prevented with diet and lifestyle changes. If you already have high cholesterol, you can control it with diet, lifestyle changes, and medicines. How can high cholesterol affect me? If you have high cholesterol, fatty deposits (plaques) may build up on the walls of your blood vessels. The blood vessels that carry blood away from your heart are called arteries. Plaques make the arteries narrower and stiffer. This in turn can:  Restrict or block blood flow and cause blood clots to form.  Increase your risk for heart attack and stroke. What can increase my risk for high cholesterol? This condition is more likely to develop in people who:  Eat foods that are high in saturated fat or cholesterol. Saturated fat is mostly found in foods that come from animal sources.  Are overweight.  Are not getting enough exercise.  Have a family history of high cholesterol (familial hypercholesterolemia). What actions can I take to prevent this? Nutrition  Eat less saturated  fat.  Avoid trans fats (partially hydrogenated oils). These are often found in margarine and in some baked goods, fried foods, and snacks bought in packages.  Avoid precooked or cured meat, such as bacon, sausages, or meat loaves.  Avoid foods and drinks that have added sugars.  Eat more fruits, vegetables, and whole grains.  Choose healthy sources of protein, such as fish, poultry, lean cuts of red meat, beans, peas, lentils, and nuts.  Choose healthy sources of fat, such as: ? Nuts. ? Vegetable oils, especially olive oil. ? Fish that have healthy fats, such as omega-3 fatty acids. These fish include mackerel or salmon.   Lifestyle  Lose weight if you are overweight. Maintaining a healthy body mass index (BMI) can help prevent or control high cholesterol. It can also lower your risk for diabetes and high blood pressure. Ask your health care provider to help you with a diet and exercise plan to lose weight safely.  Do not use any products that contain nicotine or tobacco, such as cigarettes, e-cigarettes, and chewing tobacco. If you need help quitting, ask your health care  provider. Alcohol use  Do not drink alcohol if: ? Your health care provider tells you not to drink. ? You are pregnant, may be pregnant, or are planning to become pregnant.  If you drink alcohol: ? Limit how much you use to:  0-1 drink a day for women.  0-2 drinks a day for men. ? Be aware of how much alcohol is in your drink. In the [place] one drink equals one 12 oz bottle of beer (355 mL), one 5 oz glass of wine (148 mL), or one 1 oz glass of hard liquor (44 mL). Activity  Get enough exercise. Do exercises as told by your health care provider.  Each week, do at least 150 minutes of exercise that takes a medium level of effort (moderate-intensity exercise). This kind of exercise: ? Makes your heart beat faster while allowing you to still be able to talk. ? Can be done in short sessions several times a day  or longer sessions a few times a week. For example, on 5 days each week, you could walk fast or ride your bike 3 times a day for 10 minutes each time.   Medicines  Your health care provider may recommend medicines to help lower cholesterol. This may be a medicine to lower the amount of cholesterol that your liver makes. You may need medicine if: ? Diet and lifestyle changes have not lowered your cholesterol enough. ? You have high cholesterol and other risk factors for heart disease or stroke.  Take over-the-counter and prescription medicines only as told by your health care provider. General information  Manage your risk factors for high cholesterol. Talk with your health care provider about all your risk factors and how to lower your risk.  Manage other conditions that you have, such as diabetes or high blood pressure (hypertension).  Have blood tests to check your cholesterol levels at regular points in time as told by your health care provider.  Keep all follow-up visits as told by your health care provider. This is important. Where to find more information  American Heart Association: www.heart.org  National Heart, Lung, and Blood Institute: PopSteam.is Summary  High cholesterol increases your risk for heart disease and stroke. By keeping your cholesterol level low, you can reduce your risk for these conditions.  High cholesterol can often be prevented with diet and lifestyle changes.  Work with your health care provider to manage your risk factors, and have your blood tested regularly. This information is not intended to replace advice given to you by your health care provider. Make sure you discuss any questions you have with your health care provider. Document Revised: 12/03/2018 Document Reviewed: 12/03/2018 Elsevier Patient Education  2021 [employer].    If you have lab work done today you will be contacted with your lab results within the next 2 weeks.  If you  have not heard from [place] then please contact us. The fastest way to get your results is to register for My Chart.   IF you received an x-ray today, you will receive an invoice from [facility]. Please contact [facility] at [phone] with questions or concerns regarding your invoice.   IF you received labwork today, you will receive an invoice from [facility]. Please contact [facility] at [phone] with questions or concerns regarding your invoice.   Our billing staff will not be able to assist you with questions regarding bills from these companies.  You will be contacted with the lab results as soon as  they are available. The fastest way to get your results is to activate your My Chart account. Instructions are located on the last page of this paperwork. If you have not heard from [place] regarding the results in 2 weeks, please contact this office.          Signed, Meredith Staggers, MD [facility]

## 2020-05-07 NOTE — Patient Instructions (Addendum)
Ok to continue 50,000units per week vitamin D but can also take over the counter 1000mg  per day.   Try to cut back on alcohol as likely cause of elevated liver tests. Repeat liver tests next visit.  Resources below if difficulty cutting back.   Thanks for coming in today and let me know if there are any questions.   [facility]  Address: [address] [place], [place]  Phone: [phone]  [facility]  Address: [address] [place], [place]  Phone: [phone]  [facility] Address: [address] [place], [place], [place]  Phone: [phone]    Preventing High Cholesterol Cholesterol is a white, waxy substance similar to fat that the human body needs to help build cells. The liver makes all the cholesterol that a person's body needs. Having high cholesterol (hypercholesterolemia) increases your risk for heart disease and stroke. Extra or excess cholesterol comes from the food that you eat. High cholesterol can often be prevented with diet and lifestyle changes. If you already have high cholesterol, you can control it with diet, lifestyle changes, and medicines. How can high cholesterol affect me? If you have high cholesterol, fatty deposits (plaques) may build up on the walls of your blood vessels. The blood vessels that carry blood away from your heart are called arteries. Plaques make the arteries narrower and stiffer. This in turn can:  Restrict or block blood flow and cause blood clots to form.  Increase your risk for heart attack and stroke. What can increase my risk for high cholesterol? This condition is more likely to develop in people who:  Eat foods that are high in saturated fat or cholesterol. Saturated fat is mostly found in foods that come from animal sources.  Are overweight.  Are not getting enough exercise.  Have a family history of high cholesterol (familial hypercholesterolemia). What actions can I  take to prevent this? Nutrition  Eat less saturated fat.  Avoid trans fats (partially hydrogenated oils). These are often found in margarine and in some baked goods, fried foods, and snacks bought in packages.  Avoid precooked or cured meat, such as bacon, sausages, or meat loaves.  Avoid foods and drinks that have added sugars.  Eat more fruits, vegetables, and whole grains.  Choose healthy sources of protein, such as fish, poultry, lean cuts of red meat, beans, peas, lentils, and nuts.  Choose healthy sources of fat, such as: ? Nuts. ? Vegetable oils, especially olive oil. ? Fish that have healthy fats, such as omega-3 fatty acids. These fish include mackerel or salmon.   Lifestyle  Lose weight if you are overweight. Maintaining a healthy body mass index (BMI) can help prevent or control high cholesterol. It can also lower your risk for diabetes and high blood pressure. Ask your health care provider to help you with a diet and exercise plan to lose weight safely.  Do not use any products that contain nicotine or tobacco, such as cigarettes, e-cigarettes, and chewing tobacco. If you need help quitting, ask your health care provider. Alcohol use  Do not drink alcohol if: ? Your health care provider tells you not to drink. ? You are pregnant, may be pregnant, or are planning to become pregnant.  If you drink alcohol: ? Limit how much you use to:  0-1 drink a day for women.  0-2 drinks a day for men. ? Be aware of how much alcohol is in your drink. In  the [place] one drink equals one 12 oz bottle of beer (355 mL), one 5 oz glass of wine (148 mL), or one 1 oz glass of hard liquor (44 mL). Activity  Get enough exercise. Do exercises as told by your health care provider.  Each week, do at least 150 minutes of exercise that takes a medium level of effort (moderate-intensity exercise). This kind of exercise: ? Makes your heart beat faster while allowing you to still be able to  talk. ? Can be done in short sessions several times a day or longer sessions a few times a week. For example, on 5 days each week, you could walk fast or ride your bike 3 times a day for 10 minutes each time.   Medicines  Your health care provider may recommend medicines to help lower cholesterol. This may be a medicine to lower the amount of cholesterol that your liver makes. You may need medicine if: ? Diet and lifestyle changes have not lowered your cholesterol enough. ? You have high cholesterol and other risk factors for heart disease or stroke.  Take over-the-counter and prescription medicines only as told by your health care provider. General information  Manage your risk factors for high cholesterol. Talk with your health care provider about all your risk factors and how to lower your risk.  Manage other conditions that you have, such as diabetes or high blood pressure (hypertension).  Have blood tests to check your cholesterol levels at regular points in time as told by your health care provider.  Keep all follow-up visits as told by your health care provider. This is important. Where to find more information  American Heart Association: www.heart.org  National Heart, Lung, and Blood Institute: PopSteam.is Summary  High cholesterol increases your risk for heart disease and stroke. By keeping your cholesterol level low, you can reduce your risk for these conditions.  High cholesterol can often be prevented with diet and lifestyle changes.  Work with your health care provider to manage your risk factors, and have your blood tested regularly. This information is not intended to replace advice given to you by your health care provider. Make sure you discuss any questions you have with your health care provider. Document Revised: 12/03/2018 Document Reviewed: 12/03/2018 Elsevier Patient Education  2021 [employer].    If you have lab work done today you will be  contacted with your lab results within the next 2 weeks.  If you have not heard from [place] then please contact us. The fastest way to get your results is to register for My Chart.   IF you received an x-ray today, you will receive an invoice from [facility]. Please contact [facility] at [phone] with questions or concerns regarding your invoice.   IF you received labwork today, you will receive an invoice from [facility]. Please contact [facility] at [phone] with questions or concerns regarding your invoice.   Our billing staff will not be able to assist you with questions regarding bills from these companies.  You will be contacted with the lab results as soon as they are available. The fastest way to get your results is to activate your My Chart account. Instructions are located on the last page of this paperwork. If you have not heard from [place] regarding the results in 2 weeks, please contact this office.

## 2020-05-20 ENCOUNTER — Encounter: Payer: Self-pay | Admitting: Family Medicine

## 2020-05-20 DIAGNOSIS — E669 Obesity, unspecified: Secondary | ICD-10-CM

## 2020-05-22 ENCOUNTER — Other Ambulatory Visit: Payer: Self-pay | Admitting: Family Medicine

## 2020-05-22 MED ORDER — WEGOVY 2.4 MG/0.75ML ~~LOC~~ SOAJ: 2.4000 mg | SUBCUTANEOUS | 3 refills | Status: DC

## 2020-05-22 NOTE — Telephone Encounter (Signed)
Med refilled, but would recommend weight loss specialist monitor if continued use.

## 2020-06-05 ENCOUNTER — Other Ambulatory Visit (HOSPITAL_BASED_OUTPATIENT_CLINIC_OR_DEPARTMENT_OTHER): Payer: Self-pay

## 2020-06-05 ENCOUNTER — Other Ambulatory Visit (HOSPITAL_COMMUNITY): Payer: Self-pay

## 2020-06-05 MED FILL — Semaglutide (Weight Mngmt) Soln Auto-Injector 2.4 MG/0.75ML: SUBCUTANEOUS | 28 days supply | Qty: 3 | Fill #0 | Status: AC

## 2020-06-17 ENCOUNTER — Other Ambulatory Visit (HOSPITAL_COMMUNITY): Payer: Self-pay

## 2020-06-17 MED FILL — Ergocalciferol Cap 1.25 MG (50000 Unit): ORAL | 14 days supply | Qty: 2 | Fill #0 | Status: AC

## 2020-06-25 ENCOUNTER — Other Ambulatory Visit (HOSPITAL_COMMUNITY): Payer: Self-pay

## 2020-06-25 MED FILL — Losartan Potassium Tab 100 MG: ORAL | 30 days supply | Qty: 30 | Fill #0 | Status: AC

## 2020-07-06 ENCOUNTER — Encounter: Payer: Self-pay | Admitting: Family Medicine

## 2020-07-06 DIAGNOSIS — G8929 Other chronic pain: Secondary | ICD-10-CM

## 2020-07-06 DIAGNOSIS — M5442 Lumbago with sciatica, left side: Secondary | ICD-10-CM

## 2020-07-07 ENCOUNTER — Other Ambulatory Visit (HOSPITAL_COMMUNITY): Payer: Self-pay

## 2020-07-08 MED ORDER — METHOCARBAMOL 500 MG PO TABS: ORAL_TABLET | Freq: Four times a day (QID) | ORAL | 1 refills | Status: DC | PRN

## 2020-07-08 NOTE — Telephone Encounter (Signed)
Patient is requesting a refill of the following medications: Requested Prescriptions   Pending Prescriptions Disp Refills  . methocarbamol (ROBAXIN) 500 MG tablet 30 tablet 1    Sig: TAKE 1 TABLET BY MOUTH EVERY 6 HOURS AS NEEDED FOR MUSCLE SPASMS    Date of patient request: 07/05/2020 Last office visit: 05/07/2020 Date of last refill: 04/14/2020 Last refill amount: 30 Follow up time period per chart: 6 month

## 2020-07-12 ENCOUNTER — Encounter: Payer: Self-pay | Admitting: Family Medicine

## 2020-07-12 DIAGNOSIS — E559 Vitamin D deficiency, unspecified: Secondary | ICD-10-CM

## 2020-07-13 ENCOUNTER — Other Ambulatory Visit (HOSPITAL_COMMUNITY): Payer: Self-pay

## 2020-07-13 MED ORDER — VITAMIN D (ERGOCALCIFEROL) 1.25 MG (50000 UNIT) PO CAPS
ORAL_CAPSULE | ORAL | 3 refills | Status: DC
  Filled 2020-07-13: qty 5, 35d supply, fill #0
  Filled 2020-08-16: qty 5, 35d supply, fill #1
  Filled 2020-09-22: qty 5, 35d supply, fill #2
  Filled 2020-11-17: qty 5, 35d supply, fill #3

## 2020-07-13 MED ORDER — CITALOPRAM HYDROBROMIDE 40 MG PO TABS
40.0000 mg | ORAL_TABLET | Freq: Every day | ORAL | 1 refills | Status: DC
  Filled 2020-07-13: qty 90, 90d supply, fill #0
  Filled 2020-10-26: qty 90, 90d supply, fill #1

## 2020-07-13 NOTE — Telephone Encounter (Signed)
Patient is requesting a refill of the following medications: Requested Prescriptions   Pending Prescriptions Disp Refills  . citalopram (CELEXA) 40 MG tablet      Sig: Take 1 tablet (40 mg total) by mouth daily.    Date of patient request: 07/12/2020 Last office visit: 05/07/2020 Date of last refill: 03/14/2020 Last refill amount: ? Follow up time period per chart: ?  Pt requesting you start filling Celexa I do not see where you have previously prescribed this  Please advise action.

## 2020-07-15 ENCOUNTER — Other Ambulatory Visit (HOSPITAL_COMMUNITY): Payer: Self-pay

## 2020-08-03 ENCOUNTER — Other Ambulatory Visit (HOSPITAL_COMMUNITY): Payer: Self-pay

## 2020-08-03 MED FILL — Losartan Potassium Tab 100 MG: ORAL | 90 days supply | Qty: 90 | Fill #0 | Status: AC

## 2020-08-16 ENCOUNTER — Other Ambulatory Visit (HOSPITAL_COMMUNITY): Payer: Self-pay

## 2020-08-16 MED FILL — Pantoprazole Sodium EC Tab 40 MG (Base Equiv): ORAL | 90 days supply | Qty: 90 | Fill #0 | Status: AC

## 2020-08-17 ENCOUNTER — Other Ambulatory Visit (HOSPITAL_COMMUNITY): Payer: Self-pay

## 2020-08-17 MED FILL — Montelukast Sodium Tab 10 MG (Base Equiv): ORAL | 90 days supply | Qty: 90 | Fill #0 | Status: AC

## 2020-08-19 ENCOUNTER — Other Ambulatory Visit (HOSPITAL_COMMUNITY): Payer: Self-pay

## 2020-09-02 ENCOUNTER — Other Ambulatory Visit (HOSPITAL_COMMUNITY): Payer: Self-pay

## 2020-09-02 MED FILL — Semaglutide (Weight Mngmt) Soln Auto-Injector 2.4 MG/0.75ML: SUBCUTANEOUS | 28 days supply | Qty: 3 | Fill #1 | Status: AC

## 2020-09-22 ENCOUNTER — Other Ambulatory Visit (HOSPITAL_COMMUNITY): Payer: Self-pay

## 2020-10-26 ENCOUNTER — Other Ambulatory Visit (HOSPITAL_COMMUNITY): Payer: Self-pay

## 2020-11-17 ENCOUNTER — Other Ambulatory Visit (HOSPITAL_COMMUNITY): Payer: Self-pay

## 2020-11-17 MED FILL — Losartan Potassium Tab 100 MG: ORAL | 90 days supply | Qty: 90 | Fill #1 | Status: AC

## 2020-12-07 ENCOUNTER — Other Ambulatory Visit (HOSPITAL_COMMUNITY): Payer: Self-pay

## 2020-12-07 MED FILL — Pantoprazole Sodium EC Tab 40 MG (Base Equiv): ORAL | 90 days supply | Qty: 90 | Fill #1 | Status: AC

## 2020-12-08 ENCOUNTER — Other Ambulatory Visit (HOSPITAL_COMMUNITY): Payer: Self-pay

## 2020-12-08 MED FILL — Montelukast Sodium Tab 10 MG (Base Equiv): ORAL | 90 days supply | Qty: 90 | Fill #1 | Status: AC

## 2021-02-02 ENCOUNTER — Telehealth: Payer: No Typology Code available for payment source | Admitting: Family

## 2021-02-02 DIAGNOSIS — J209 Acute bronchitis, unspecified: Secondary | ICD-10-CM | POA: Diagnosis not present

## 2021-02-02 MED ORDER — PREDNISONE 10 MG (21) PO TBPK: ORAL_TABLET | ORAL | 0 refills | Status: DC

## 2021-02-02 MED ORDER — BENZONATATE 100 MG PO CAPS: 100.0000 mg | ORAL_CAPSULE | Freq: Three times a day (TID) | ORAL | 0 refills | Status: DC | PRN

## 2021-02-02 NOTE — Progress Notes (Signed)

## 2021-02-07 ENCOUNTER — Other Ambulatory Visit: Payer: Self-pay | Admitting: Family Medicine

## 2021-02-07 ENCOUNTER — Other Ambulatory Visit (HOSPITAL_COMMUNITY): Payer: Self-pay

## 2021-02-07 DIAGNOSIS — E559 Vitamin D deficiency, unspecified: Secondary | ICD-10-CM

## 2021-02-08 ENCOUNTER — Other Ambulatory Visit (HOSPITAL_COMMUNITY): Payer: Self-pay

## 2021-02-08 MED ORDER — VITAMIN D (ERGOCALCIFEROL) 1.25 MG (50000 UNIT) PO CAPS
ORAL_CAPSULE | ORAL | 3 refills | Status: DC
  Filled 2021-02-08: qty 12, 84d supply, fill #0

## 2021-02-10 ENCOUNTER — Other Ambulatory Visit (HOSPITAL_COMMUNITY): Payer: Self-pay

## 2021-03-01 ENCOUNTER — Telehealth: Payer: BLUE CROSS/BLUE SHIELD | Admitting: Emergency Medicine

## 2021-03-01 DIAGNOSIS — R6889 Other general symptoms and signs: Secondary | ICD-10-CM

## 2021-03-01 MED ORDER — OSELTAMIVIR PHOSPHATE 75 MG PO CAPS: 75.0000 mg | ORAL_CAPSULE | Freq: Two times a day (BID) | ORAL | 0 refills | Status: DC

## 2021-03-01 NOTE — Progress Notes (Signed)
E visit for Flu like symptoms   We are sorry that you are not feeling well.  Here is how we plan to help! Based on what you have shared with me it looks like you may have a respiratory virus that may be influenza.  Influenza or "the flu" is   an infection caused by a respiratory virus. The flu virus is highly contagious and persons who did not receive their yearly flu vaccination may "catch" the flu from close contact.  We have anti-viral medications to treat the viruses that cause this infection. They are not a "cure" and only shorten the course of the infection. These prescriptions are most effective when they are given within the first 2 days of "flu" symptoms. Antiviral medication are indicated if you have a high risk of complications from the flu. You should  also consider an antiviral medication if you are in close contact with someone who is at risk. These medications can help patients avoid complications from the flu  but have side effects that you should know. Possible side effects from Tamiflu or oseltamivir include nausea, vomiting, diarrhea, dizziness, headaches, eye redness, sleep problems or other respiratory symptoms. You should not take Tamiflu if you have an allergy to oseltamivir or any to the ingredients in Tamiflu.  Based upon your symptoms and potential risk factors I have prescribed Oseltamivir (Tamiflu).  It has been sent to your designated pharmacy.  You will take one 75 mg capsule orally twice a day for the next 5 days.  ANYONE WHO HAS FLU SYMPTOMS SHOULD: Stay home. The flu is highly contagious and going out or to work exposes others! Be sure to drink plenty of fluids. Water is fine as well as fruit juices, sodas and electrolyte beverages. You may want to stay away from caffeine or alcohol. If you are nauseated, try taking small sips of liquids. How do you know if you are getting enough fluid? Your urine should be a pale yellow or almost colorless. Get rest. Taking a steamy  shower or using a humidifier may help nasal congestion and ease sore throat pain. Using a saline nasal spray works much the same way. Cough drops, hard candies and sore throat lozenges may ease your cough. Line up a caregiver. Have someone check on you regularly.   GET HELP RIGHT AWAY IF: You cannot keep down liquids or your medications. You become short of breath Your fell like you are going to pass out or loose consciousness. Your symptoms persist after you have completed your treatment plan MAKE SURE YOU  Understand these instructions. Will watch your condition. Will get help right away if you are not doing well or get worse.  Your e-visit answers were reviewed by a board certified advanced clinical practitioner to complete your personal care plan.  Depending on the condition, your plan could have included both over the counter or prescription medications.  If there is a problem please reply  once you have received a response from your provider.  Your safety is important to us.  If you have drug allergies check your prescription carefully.    You can use MyChart to ask questions about today's visit, request a non-urgent call back, or ask for a work or school excuse for 24 hours related to this e-Visit. If it has been greater than 24 hours you will need to follow up with your provider, or enter a new e-Visit to address those concerns.  You will get an e-mail in the next   two days asking about your experience.  I hope that your e-visit has been valuable and will speed your recovery. Thank you for using e-visits.   Approximately 5 minutes was used in reviewing the patient's chart, questionnaire, prescribing medications, and documentation.  

## 2021-03-03 ENCOUNTER — Ambulatory Visit
Admission: EM | Admit: 2021-03-03 | Discharge: 2021-03-03 | Disposition: A | Discharge status: Home / Self Care | Payer: No Typology Code available for payment source | Attending: Emergency Medicine | Admitting: Emergency Medicine

## 2021-03-03 ENCOUNTER — Other Ambulatory Visit: Payer: Self-pay

## 2021-03-03 DIAGNOSIS — R052 Subacute cough: Secondary | ICD-10-CM | POA: Insufficient documentation

## 2021-03-03 DIAGNOSIS — R945 Abnormal results of liver function studies: Secondary | ICD-10-CM | POA: Diagnosis not present

## 2021-03-03 DIAGNOSIS — R8290 Unspecified abnormal findings in urine: Secondary | ICD-10-CM | POA: Diagnosis not present

## 2021-03-03 DIAGNOSIS — R509 Fever, unspecified: Secondary | ICD-10-CM | POA: Diagnosis not present

## 2021-03-03 DIAGNOSIS — J988 Other specified respiratory disorders: Secondary | ICD-10-CM | POA: Diagnosis not present

## 2021-03-03 LAB — POCT URINALYSIS DIP (MANUAL ENTRY)
Blood, UA: NEGATIVE
Glucose, UA: 100 mg/dL — AB
Ketones, POC UA: NEGATIVE mg/dL
Leukocytes, UA: NEGATIVE
Nitrite, UA: NEGATIVE
Spec Grav, UA: 1.025 (ref 1.010–1.025)
Urobilinogen, UA: >=8 E.U./dL — AB
pH, UA: 6 (ref 5.0–8.0)

## 2021-03-03 LAB — POCT MONO SCREEN (KUC): Mono, POC: NEGATIVE

## 2021-03-03 LAB — POCT RAPID STREP A (OFFICE): Rapid Strep A Screen: NEGATIVE

## 2021-03-03 MED ORDER — AEROCHAMBER PLUS FLO-VU MEDIUM MISC
1.0000 | Freq: Once | Status: AC
  Administered 2021-03-03: 15:00:00 1

## 2021-03-03 MED ORDER — IPRATROPIUM BROMIDE 0.06 % NA SOLN: 2.0000 | Freq: Four times a day (QID) | NASAL | 0 refills | Status: DC

## 2021-03-03 MED ORDER — GUAIFENESIN 400 MG PO TABS: ORAL_TABLET | ORAL | 0 refills | Status: DC

## 2021-03-03 MED ORDER — PROMETHAZINE-DM 6.25-15 MG/5ML PO SYRP: 5.0000 mL | ORAL_SOLUTION | Freq: Four times a day (QID) | ORAL | 0 refills | Status: DC | PRN

## 2021-03-03 MED ORDER — ALBUTEROL SULFATE HFA 108 (90 BASE) MCG/ACT IN AERS
6.0000 | INHALATION_SPRAY | Freq: Once | RESPIRATORY_TRACT | Status: AC
  Administered 2021-03-03: 15:00:00 6 via RESPIRATORY_TRACT

## 2021-03-03 NOTE — ED Provider Notes (Signed)
[facility]    CSN: [id number] Arrival date & time: 03/03/21  1132    HISTORY  No chief complaint on file.  HPI Ryan Holland is a 50 y.o. male. Pt reports having a fever for 5 days, he was prescribed Tamiflu 2 days ago which by his account was 2 and half days after his fever started, patient states he has completed the prescription was not had any improvement of symptoms.  Patient states he needs a covid/ flu test to return back to work.  Patient states his temperature is usually between 101 and 102 when he wakes up in the morning, typically it improves throughout the day.  Patient states he has been alternating doses of Tylenol and ibuprofen with some relief.  Patient denies burning with urination, abdominal pain, nausea, vomiting, diarrhea, sore throat, otalgia, sinusitis, body aches, chills.  States he has been sleeping most of the day for the past few days, has not felt like doing much of anything, reports decreased appetite.  The history is provided by the patient.  Past Medical History:  Diagnosis Date   Anxiety    GERD (gastroesophageal reflux disease)    OCCAS-WOULD USE OTC MED IF NEEDED   HNP (herniated nucleus pulposus), lumbar    PAIN IN LOWER BACK AND PAIN DOWN BOTH LEGS -12-24-14 radiates down right leg presently.   Hypertension    Sleep apnea    HAS CPAP-DOES NOT USE-DOES NOT KNOW SETTINGS   Patient Active Problem List   Diagnosis Date Noted   Hypogonadism in male 05/07/2020   Vitamin D deficiency 05/07/2020   Cough 08/25/2014   Acid reflux 08/25/2014   Allergic rhinitis 08/25/2014   Past Surgical History:  Procedure Laterality Date   01/05/2011 LUMBAR DECOMPRESSION AND MICRODISCECTOMY     CERVICAL FUSION  C4-5      ESOPHAGOGASTRODUODENOSCOPY (EGD) WITH PROPOFOL N/A 12/30/2014   Procedure: ESOPHAGOGASTRODUODENOSCOPY (EGD) WITH PROPOFOL;  Surgeon: Arta Silence, MD;  Location: [facility];  Service: Endoscopy;  Laterality: N/A;  With Bravo    LUMBAR LAMINECTOMY/DECOMPRESSION MICRODISCECTOMY  07/26/2011   Procedure: LUMBAR LAMINECTOMY/DECOMPRESSION MICRODISCECTOMY;  Surgeon: Johnn Hai, MD;  Location: [facility];  Service: Orthopedics;  Laterality: Right;  Re-Do Lumbar Decompression L5-S1 on Right,    PILONIDAL CYST EXCISION      Home Medications    Prior to Admission medications   Medication Sig Start Date End Date Taking? Authorizing Provider  cetirizine (ZYRTEC) 10 MG tablet Take 10 mg by mouth 2 (two) times daily.     [provider]  citalopram (CELEXA) 40 MG tablet Take 1 tablet (40 mg total) by mouth daily. 07/13/20   Wendie Agreste, MD  fluticasone (FLONASE) 50 MCG/ACT nasal spray Place 2 sprays into both nostrils daily.    [provider]  losartan (COZAAR) 100 MG tablet TAKE 1 TABLET BY MOUTH ONCE DAILY 12/02/19 12/01/20  Corrington, Kip A, MD  losartan (COZAAR) 100 MG tablet TAKE 1 TABLET BY MOUTH ONCE A DAY 05/07/20 05/07/21  Wendie Agreste, MD  methocarbamol (ROBAXIN) 500 MG tablet TAKE 1 TABLET BY MOUTH EVERY 6 HOURS AS NEEDED FOR MUSCLE SPASMS 07/08/20 07/08/21  Wendie Agreste, MD  montelukast (SINGULAIR) 10 MG tablet TAKE 1 TABLET BY MOUTH ONCE A DAY 05/07/20 05/07/21  Wendie Agreste, MD  NEEDLE, DISP, 18 G (BD DISP NEEDLES) 18G X 1-1/2" MISC See admin instructions. 03/11/19   [provider]  NEEDLE, DISP, 23 G 23G X 1" MISC See admin instructions.  10/05/17   [provider]  oseltamivir (TAMIFLU) 75 MG capsule Take 1 capsule (75 mg total) by mouth 2 (two) times daily. 03/01/21   Montine Circle, [name]  pantoprazole (PROTONIX) 40 MG tablet TAKE 1 TABLET BY MOUTH ONCE A DAY 05/07/20 05/07/21  Wendie Agreste, MD  predniSONE (STERAPRED UNI-PAK 21 TAB) 10 MG (21) TBPK tablet Use as directed 02/02/21   Sharion Balloon, FNP  Semaglutide-Weight Management 2.4 MG/0.75ML SOAJ INJECT 2.4MG  UNDER THE SKIN ONCE A WEEK 05/22/20 05/22/21  Wendie Agreste, MD  testosterone cypionate (DEPOTESTOSTERONE  CYPIONATE) 200 MG/ML injection INJECT 1 ML (200 MG TOTAL) INTO THE MUSCLE EVERY 14 (FOURTEEN) DAYS. 05/07/20 11/03/20  Wendie Agreste, MD  Vitamin D, Ergocalciferol, (DRISDOL) 1.25 MG (50000 UNIT) CAPS capsule TAKE 1 CAPSULE BY MOUTH EVERY 7 DAYS 02/08/21 02/08/22  Wendie Agreste, MD   Family History Family History  Problem Relation Age of Onset   COPD Father    Congestive Heart Failure Father    Breast cancer Mother    Social History Social History   Tobacco Use   Smoking status: Former    Packs/day: 1.00    Years: 20.00    Pack years: 20.00    Types: Cigarettes    Quit date: 10/05/2011    Years since quitting: 9.4   Smokeless tobacco: Former    Types: Chew  Substance Use Topics   Alcohol use: Yes    Alcohol/week: 0.0 standard drinks    Comment: 2 MIXED DRINKS (VODKA) DAILY-usually    Drug use: No   Allergies   Other  Review of Systems Review of Systems Pertinent findings noted in history of present illness.   Physical Exam Triage Vital Signs ED Triage Vitals  Enc Vitals Group     BP 12/31/20 0827 (!) 147/82     Pulse Rate 12/31/20 0827 72     Resp 12/31/20 0827 18     Temp 12/31/20 0827 98.3 F (36.8 C)     Temp Source 12/31/20 0827 Oral     SpO2 12/31/20 0827 98 %     Weight --      Height --      Head Circumference --      Peak Flow --      Pain Score 12/31/20 0826 5     Pain Loc --      Pain Edu? --      Excl. in [place]? --   No data found.  Updated Vital Signs BP (!) 142/82 (BP Location: Left Arm)    Pulse 100    Temp 99.8 F (37.7 C) (Oral)    Resp 20    SpO2 94%   Physical Exam Eyes:     General: No scleral icterus.    Conjunctiva/sclera: Conjunctivae normal.    Visual Acuity Right Eye Distance:   Left Eye Distance:   Bilateral Distance:    Right Eye Near:   Left Eye Near:    Bilateral Near:     UC Couse / Diagnostics / Procedures:    EKG  Radiology No results found.  Procedures Procedures (including critical care time)  UC  Diagnoses / Final Clinical Impressions(s)   I have reviewed the triage vital signs and the nursing notes.  Pertinent labs & imaging results that were available during my care of the patient were reviewed by me and considered in my medical decision making (see chart for details).   Final diagnoses:  Respiratory infection  Fever, unspecified  fever cause  Subacute cough  Abnormal finding in urine  Abnormal liver function   Fever: A significant amount of urobilinogen was present in patient's urine, we obtained CBC, hepatitis panel, comprehensive metabolic panel during visit today.  Monospot was negative, rapid strep test was negative.  COVID/flu/RSV tests are pending, I have a very low suspicion for these.  Urine culture will be performed, throat culture will be performed.  Patient will be treated as indicated based on test results.  Cough: Recommend that patient continue albuterol for cough, have provided him with prescriptions for Mucinex and Promethazine DM for nighttime cough.  Have also recommended that he continue ibuprofen and discontinue Tylenol at this time.  ED Prescriptions     Medication Sig Dispense Auth. Provider   guaifenesin (HUMIBID E) 400 MG TABS tablet Take 1 tablet 3 times daily as needed for chest congestion and cough 21 tablet Lynden Oxford Scales, [name]   promethazine-dextromethorphan (PROMETHAZINE-DM) 6.25-15 MG/5ML syrup Take 5 mLs by mouth 4 (four) times daily as needed for cough. 180 mL Lynden Oxford Scales, [name]   ipratropium (ATROVENT) 0.06 % nasal spray Place 2 sprays into both nostrils 4 (four) times daily. As needed for nasal congestion, runny nose 15 mL Lynden Oxford Scales, [name]      PDMP not reviewed this encounter.  Pending results:  Labs Reviewed  POCT URINALYSIS DIP (MANUAL ENTRY) - Abnormal; Notable for the following components:      Result Value   Color, UA orange (*)    Glucose, UA =100 (*)    Bilirubin, UA small (*)    Protein Ur, POC trace  (*)    Urobilinogen, UA >=8.0 (*)    All other components within normal limits  COVID-19, FLU A+B AND RSV  CULTURE, GROUP A STREP ([place])  CBC WITH DIFFERENTIAL/PLATELET  HEPATITIS PANEL, ACUTE  POCT RAPID STREP A (OFFICE)  POCT MONO SCREEN ([facility])    Medications Ordered in UC: Medications  albuterol (VENTOLIN HFA) 108 (90 Base) MCG/ACT inhaler 6 puff (6 puffs Inhalation Given 03/03/21 1447)  AeroChamber Plus Flo-Vu Medium MISC 1 each (1 each Other Given 03/03/21 1446)    Disposition Upon Discharge:  Condition: stable for discharge home Home: take medications as prescribed; routine discharge instructions as discussed; follow up as advised.  Patient presented with an acute illness with associated systemic symptoms and significant discomfort requiring urgent management. In my opinion, this is a condition that a prudent lay person (someone who possesses an average knowledge of health and medicine) may potentially expect to result in complications if not addressed urgently such as respiratory distress, impairment of bodily function or dysfunction of bodily organs.   Routine symptom specific, illness specific and/or disease specific instructions were discussed with the patient and/or caregiver at length.   As such, the patient has been evaluated and assessed, work-up was performed and treatment was provided in alignment with urgent care protocols and evidence based medicine.  Patient/parent/caregiver has been advised that the patient may require follow up for further testing and treatment if the symptoms continue in spite of treatment, as clinically indicated and appropriate.  If the patient was tested for COVID-19, Influenza and/or RSV, then the patient/parent/guardian was advised to isolate at home pending the results of his/her diagnostic coronavirus test and potentially longer if theyre positive. I have also advised pt that if his/her COVID-19 test returns positive, it's recommended to  self-isolate for at least 10 days after symptoms first appeared AND until fever-free for 24 hours without  fever reducer AND other symptoms have improved or resolved. Discussed self-isolation recommendations as well as instructions for household member/close contacts as per the [facility] and [place] [facility], and also gave patient the COVID packet with this information.  Patient/parent/caregiver has been advised to return to the [facility] or PCP in 3-5 days if no better; to PCP or the Emergency Department if new signs and symptoms develop, or if the current signs or symptoms continue to change or worsen for further workup, evaluation and treatment as clinically indicated and appropriate  The patient will follow up with their current PCP if and as advised. If the patient does not currently have a PCP we will assist them in obtaining one.   The patient may need specialty follow up if the symptoms continue, in spite of conservative treatment and management, for further workup, evaluation, consultation and treatment as clinically indicated and appropriate.  Patient/parent/caregiver verbalized understanding and agreement of plan as discussed.  All questions were addressed during visit.  Please see discharge instructions below for further details of plan.  Discharge Instructions:   Discharge Instructions      Your symptoms and physical exam findings are concerning for a viral respiratory infection.   You were tested for RSV, COVID and influenza today.  The result of your viral testing will be posted to your MyChart once it is complete, this typically takes 24 to 48 hours.  If there is a positive result, you will be contacted by phone with further recommendations, if any.   Your rapid strep test today was negative.  Per our protocol, throat culture will be performed and if there is a positive result, antibiotics will be prescribed for you.  Your Monospot test today was negative, I do not have any concern that you are  suffering from mononucleosis.   The urinalysis that we performed today was abnormal.  Urine culture will be performed per our protocol, I do have a low suspicion that you have a urinary tract infection due to the significantly elevated levels of urobilinogen in your urine.    Urobilinogen is produced by bacteria that breaks down the bilirubin excreted into your your stool, a normal urobilinogen level ranges between 0.1 and 2.  The process of bacteria breaking down bile is normal, the urobilinogen typically is recirculated through the liver and filtered.  When there is a problem with your liver, is not able to filter the urobilinogen very well and this causes your urine levels of urobilinogen to rise because the urobilinogen has nowhere else to go but out through your urine.   The most common problems the people help with her liver are related to hepatitis and cirrhosis.  I recommend that you stop using Tylenol now and if you drink alcohol, I recommend that you discontinue that completely until we have more answers about what is going on.    Because you have been running consistent fevers for the past 5 days, I believe that the problem with your liver right now is infectious which means you likely have some form of hepatitis.  We performed a screening had test for hepatitis A, B and C today and also checked your white blood cell count.  The results of these 2 tests will be posted to your MyChart as soon as they are available.  If they are abnormal, you will receive a phone call with further recommendations.  The results of your urine culture should be available in the next 3 to 5 days and will be  posted to your MyChart account.  If there are any abnormal results, you will be contacted by phone and if further treatment is needed, this will be provided for you.  Based on my physical exam findings and the history you have provided  today, I do not recommend antibiotics at this time.  I do not believe the  risks and side effects of antibiotics would outweigh any minimal benefit that they might provide.       Conservative care is also recommended at this time.  This includes rest, pushing clear fluids and activity as tolerated.  Warm beverages such as teas and broths versus cold beverages/popsicles and frozen sherbet/sorbet are personal choice, both warm and cold are beneficial.  You may also notice that your appetite is reduced; this is okay as long as you are drinking plenty of clear fluids.    Please see the list below for recommended medications, dosages and frequencies to provide relief of your current symptoms:      Ibuprofen  (Advil, Motrin): This is a good anti-inflammatory medication which addresses aches and pains and inflammation of the upper airways that causes sinus and nasal congestion as well as in the lower airways which makes your cough feel tight and sometimes burn.  I recommend that you take between 400 to 600 mg every 6-8 hours as needed.      Guaifenesin (Robitussin, Mucinex): This is an expectorant.  This helps break up chest congestion and loosen up thick nasal drainage making phlegm and drainage more liquid and therefore easier to remove.  I recommend being 400 mg three times daily as needed.      Promethazine DM: Promethazine is both the nasal decongestant and an antinausea medication that makes most patients feel fairly sleepy.  The DM is dextromethorphan, a cough suppressant found many over-the-counter cough medications.  Please take 5 mL before bedtime to help you sleep better, minimize your cough.  I have provided you with a prescription for this medication.      Ipratropium (Atrovent): This is an excellent nasal decongestant spray that does not cause rebound congestion, can be used up to 4 times daily as needed, instill 2 sprays into each nare with each use.  I have provided you with a prescription for this medication.      Albuterol HFA: This is a bronchodilator, it relaxes  the smooth muscles that constrict your airway in your lungs when you are feeling sick or having inflammation secondary to allergies.  Please inhale 2 puffs twice daily every day using the spacer provided.  You can also inhale 2 more puffs as often as needed throughout the day for aggravating cough, chest tightness, feeling short of breath, wheezing.     Please remain home from work, school, public places until you have been fever free for 24 hours without the use of antifever medications such as Tylenol or ibuprofen.  Please follow-up within the next 3 to 5 days either with your primary care provider or urgent care if your symptoms do not resolve.  If you do not have a primary care provider, we will assist you in finding one.    This office note has been dictated using Museum/gallery curator.  Unfortunately, and despite my best efforts, this method of dictation can sometimes lead to occasional typographical or grammatical errors.  I apologize in advance if this occurs.      Lynden Oxford Scales, [name] 03/03/21 1525

## 2021-03-03 NOTE — Discharge Instructions (Addendum)
Your symptoms and physical exam findings are concerning for a viral respiratory infection.   You were tested for RSV, COVID and influenza today.  The result of your viral testing will be posted to your MyChart once it is complete, this typically takes 24 to 48 hours.  If there is a positive result, you will be contacted by phone with further recommendations, if any.   Your rapid strep test today was negative.  Per our protocol, throat culture will be performed and if there is a positive result, antibiotics will be prescribed for you.  Your Monospot test today was negative, I do not have any concern that you are suffering from mononucleosis.   The urinalysis that we performed today was abnormal.  Urine culture will be performed per our protocol, I do have a low suspicion that you have a urinary tract infection due to the significantly elevated levels of urobilinogen in your urine.    Urobilinogen is produced by bacteria that breaks down the bilirubin excreted into your your stool, a normal urobilinogen level ranges between 0.1 and 2.  The process of bacteria breaking down bile is normal, the urobilinogen typically is recirculated through the liver and filtered.  When there is a problem with your liver, is not able to filter the urobilinogen very well and this causes your urine levels of urobilinogen to rise because the urobilinogen has nowhere else to go but out through your urine.   The most common problems the people help with her liver are related to hepatitis and cirrhosis.  I recommend that you stop using Tylenol now and if you drink alcohol, I recommend that you discontinue that completely until we have more answers about what is going on.    Because you have been running consistent fevers for the past 5 days, I believe that the problem with your liver right now is infectious which means you likely have some form of hepatitis.  We performed a screening had test for hepatitis A, B and C today and  also checked your white blood cell count.  The results of these 2 tests will be posted to your MyChart as soon as they are available.  If they are abnormal, you will receive a phone call with further recommendations.  The results of your urine culture should be available in the next 3 to 5 days and will be posted to your MyChart account.  If there are any abnormal results, you will be contacted by phone and if further treatment is needed, this will be provided for you.  Based on my physical exam findings and the history you have provided  today, I do not recommend antibiotics at this time.  I do not believe the risks and side effects of antibiotics would outweigh any minimal benefit that they might provide.       Conservative care is also recommended at this time.  This includes rest, pushing clear fluids and activity as tolerated.  Warm beverages such as teas and broths versus cold beverages/popsicles and frozen sherbet/sorbet are personal choice, both warm and cold are beneficial.  You may also notice that your appetite is reduced; this is okay as long as you are drinking plenty of clear fluids.    Please see the list below for recommended medications, dosages and frequencies to provide relief of your current symptoms:      Ibuprofen  (Advil, Motrin): This is a good anti-inflammatory medication which addresses aches and pains and inflammation of the upper airways  that causes sinus and nasal congestion as well as in the lower airways which makes your cough feel tight and sometimes burn.  I recommend that you take between 400 to 600 mg every 6-8 hours as needed.      Guaifenesin (Robitussin, Mucinex): This is an expectorant.  This helps break up chest congestion and loosen up thick nasal drainage making phlegm and drainage more liquid and therefore easier to remove.  I recommend being 400 mg three times daily as needed.      Promethazine DM: Promethazine is both the nasal decongestant and an antinausea  medication that makes most patients feel fairly sleepy.  The DM is dextromethorphan, a cough suppressant found many over-the-counter cough medications.  Please take 5 mL before bedtime to help you sleep better, minimize your cough.  I have provided you with a prescription for this medication.      Ipratropium (Atrovent): This is an excellent nasal decongestant spray that does not cause rebound congestion, can be used up to 4 times daily as needed, instill 2 sprays into each nare with each use.  I have provided you with a prescription for this medication.      Albuterol HFA: This is a bronchodilator, it relaxes the smooth muscles that constrict your airway in your lungs when you are feeling sick or having inflammation secondary to allergies.  Please inhale 2 puffs twice daily every day using the spacer provided.  You can also inhale 2 more puffs as often as needed throughout the day for aggravating cough, chest tightness, feeling short of breath, wheezing.     Please remain home from work, school, public places until you have been fever free for 24 hours without the use of antifever medications such as Tylenol or ibuprofen.  Please follow-up within the next 3 to 5 days either with your primary care provider or urgent care if your symptoms do not resolve.  If you do not have a primary care provider, we will assist you in finding one.

## 2021-03-03 NOTE — ED Triage Notes (Signed)
Pt reports having a fever for 5 days, he was prescribed Tamiflu and completed prescription. Patient states he needs a covid/ flu test to return back to work.

## 2021-03-04 LAB — CBC WITH DIFFERENTIAL/PLATELET
Basophils Absolute: 0.1 10*3/uL (ref 0.0–0.2)
Basos: 1 %
EOS (ABSOLUTE): 0.3 10*3/uL (ref 0.0–0.4)
Eos: 3 %
Hematocrit: 43.6 % (ref 37.5–51.0)
Hemoglobin: 15.2 g/dL (ref 13.0–17.7)
Immature Grans (Abs): 0.2 10*3/uL — ABNORMAL HIGH (ref 0.0–0.1)
Immature Granulocytes: 2 %
Lymphocytes Absolute: 1.8 10*3/uL (ref 0.7–3.1)
Lymphs: 18 %
MCH: 34 pg — ABNORMAL HIGH (ref 26.6–33.0)
MCHC: 34.9 g/dL (ref 31.5–35.7)
MCV: 98 fL — ABNORMAL HIGH (ref 79–97)
Monocytes Absolute: 1.2 10*3/uL — ABNORMAL HIGH (ref 0.1–0.9)
Monocytes: 12 %
Neutrophils Absolute: 6.5 10*3/uL (ref 1.4–7.0)
Neutrophils: 64 %
Platelets: 239 10*3/uL (ref 150–450)
RBC: 4.47 x10E6/uL (ref 4.14–5.80)
RDW: 11.7 % (ref 11.6–15.4)
WBC: 10.1 10*3/uL (ref 3.4–10.8)

## 2021-03-04 LAB — COVID-19, FLU A+B AND RSV
Influenza A, NAA: NOT DETECTED
Influenza B, NAA: NOT DETECTED
RSV, NAA: NOT DETECTED
SARS-CoV-2, NAA: NOT DETECTED

## 2021-03-05 LAB — ACUTE VIRAL HEPATITIS (HAV, HBV, HCV)
HCV Ab: 0.2 s/co ratio (ref 0.0–0.9)
Hep A IgM: NEGATIVE
Hep B C IgM: NEGATIVE
Hepatitis B Surface Ag: NEGATIVE

## 2021-03-05 LAB — SPECIMEN STATUS REPORT

## 2021-03-05 LAB — HCV INTERPRETATION

## 2021-03-06 LAB — URINE CULTURE: Culture: 20000 — AB

## 2021-03-06 LAB — CULTURE, GROUP A STREP (THRC)

## 2021-03-07 ENCOUNTER — Other Ambulatory Visit: Payer: Self-pay | Admitting: Family Medicine

## 2021-03-07 ENCOUNTER — Other Ambulatory Visit (HOSPITAL_COMMUNITY): Payer: Self-pay

## 2021-03-07 MED ORDER — CARESTART COVID-19 HOME TEST VI KIT
PACK | 0 refills | Status: DC
  Filled 2021-03-07: qty 4, 4d supply, fill #0

## 2021-03-08 ENCOUNTER — Other Ambulatory Visit (HOSPITAL_COMMUNITY): Payer: Self-pay

## 2021-03-08 ENCOUNTER — Telehealth (HOSPITAL_COMMUNITY): Payer: Self-pay

## 2021-03-08 MED ORDER — CITALOPRAM HYDROBROMIDE 40 MG PO TABS
40.0000 mg | ORAL_TABLET | Freq: Every day | ORAL | 1 refills | Status: DC
  Filled 2021-03-08: qty 90, 90d supply, fill #0
  Filled 2021-07-01: qty 90, 90d supply, fill #1

## 2021-03-08 MED ORDER — NITROFURANTOIN MONOHYD MACRO 100 MG PO CAPS: 100.0000 mg | ORAL_CAPSULE | Freq: Two times a day (BID) | ORAL | 0 refills | Status: DC

## 2021-03-17 ENCOUNTER — Other Ambulatory Visit: Payer: Self-pay

## 2021-03-17 ENCOUNTER — Ambulatory Visit
Admission: EM | Admit: 2021-03-17 | Discharge: 2021-03-17 | Disposition: A | Discharge status: Home / Self Care | Payer: No Typology Code available for payment source | Attending: Emergency Medicine | Admitting: Emergency Medicine

## 2021-03-17 DIAGNOSIS — T7840XD Allergy, unspecified, subsequent encounter: Secondary | ICD-10-CM | POA: Diagnosis not present

## 2021-03-17 DIAGNOSIS — Z9109 Other allergy status, other than to drugs and biological substances: Secondary | ICD-10-CM

## 2021-03-17 DIAGNOSIS — R0981 Nasal congestion: Secondary | ICD-10-CM | POA: Diagnosis not present

## 2021-03-17 DIAGNOSIS — Z1159 Encounter for screening for other viral diseases: Secondary | ICD-10-CM | POA: Diagnosis present

## 2021-03-17 DIAGNOSIS — R062 Wheezing: Secondary | ICD-10-CM

## 2021-03-17 DIAGNOSIS — J989 Respiratory disorder, unspecified: Secondary | ICD-10-CM

## 2021-03-17 DIAGNOSIS — J9801 Acute bronchospasm: Secondary | ICD-10-CM

## 2021-03-17 DIAGNOSIS — Z87440 Personal history of urinary (tract) infections: Secondary | ICD-10-CM

## 2021-03-17 LAB — POCT URINALYSIS DIP (MANUAL ENTRY)
Bilirubin, UA: NEGATIVE
Blood, UA: NEGATIVE
Glucose, UA: NEGATIVE mg/dL
Ketones, POC UA: NEGATIVE mg/dL
Leukocytes, UA: NEGATIVE
Nitrite, UA: NEGATIVE
Protein Ur, POC: NEGATIVE mg/dL
Spec Grav, UA: 1.02 (ref 1.010–1.025)
Urobilinogen, UA: 1 E.U./dL
pH, UA: 6.5 (ref 5.0–8.0)

## 2021-03-17 MED ORDER — METHYLPREDNISOLONE SODIUM SUCC 125 MG IJ SOLR: 125.0000 mg | Freq: Once | INTRAMUSCULAR | Status: DC

## 2021-03-17 MED ORDER — FLUTICASONE FUROATE-VILANTEROL 200-25 MCG/ACT IN AEPB: 1.0000 | INHALATION_SPRAY | Freq: Every day | RESPIRATORY_TRACT | 1 refills | Status: DC

## 2021-03-17 NOTE — ED Provider Notes (Signed)
[facility]    CSN: [id number] Arrival date & time: 03/17/21  6222    HISTORY   Chief Complaint  Patient presents with   Cough   Nasal Congestion        HPI Ryan Holland is a 51 y.o. male. Patient returns again for upper respiratory complaints.  Patient was last seen on December 29 for recurrent fever requesting COVID and flu testing so he can return to work.  Of note, patient was found to have a urinary tract infection, treated with Macrobid which was unfortunately not called until 4 days after the results of his urine culture was received, patient states he still taking it.  As before, patient does not have any symptoms of UTI, denies burning with urination, increased frequency, increased urgency, incomplete emptying, postvoid dribbling.  Patient reports a history to dust mites, grass and a host of other environmental allergens, states about 4 years ago he was tested and began immunotherapy but after a few years quit because it was not really helping.  Patient states he does not have carpet in his home, states he does wash his bedding in hot water and he also has an ionizer is in his bedroom.  Patient states he is consistent with taking Flonase, Zyrtec, Singulair, states occasionally takes Benadryl when the symptoms are really bad.  Patient states he is also been using ipratropium.  Today, patient feels like his nasal congestion is actually worse than it was when he began ipratropium, states he also has a sensation of fullness in his left ear is not sure if his ear is infected or not.  Patient states that the primary reason for his visit today is that he requires COVID and flu testing before he can return to work, states he had a fever at work yesterday, T-max unknown.  Patient has a temp of 98.4 on arrival today.  Patient is nontoxic in appearance but is obviously, significantly suffering from nasal congestion.  Patient denies use of over-the-counter nasal decongestants  containing oxymetazoline.  The history is provided by the patient.  Past Medical History:  Diagnosis Date   Anxiety    GERD (gastroesophageal reflux disease)    OCCAS-WOULD USE OTC MED IF NEEDED   HNP (herniated nucleus pulposus), lumbar    PAIN IN LOWER BACK AND PAIN DOWN BOTH LEGS -12-24-14 radiates down right leg presently.   Hypertension    Sleep apnea    HAS CPAP-DOES NOT USE-DOES NOT KNOW SETTINGS   Patient Active Problem List   Diagnosis Date Noted   Hypogonadism in male 05/07/2020   Vitamin D deficiency 05/07/2020   Cough 08/25/2014   Acid reflux 08/25/2014   Allergic rhinitis 08/25/2014   Past Surgical History:  Procedure Laterality Date   01/05/2011 LUMBAR DECOMPRESSION AND MICRODISCECTOMY     CERVICAL FUSION  C4-5      ESOPHAGOGASTRODUODENOSCOPY (EGD) WITH PROPOFOL N/A 12/30/2014   Procedure: ESOPHAGOGASTRODUODENOSCOPY (EGD) WITH PROPOFOL;  Surgeon: Arta Silence, MD;  Location: [facility];  Service: Endoscopy;  Laterality: N/A;  With Bravo   LUMBAR LAMINECTOMY/DECOMPRESSION MICRODISCECTOMY  07/26/2011   Procedure: LUMBAR LAMINECTOMY/DECOMPRESSION MICRODISCECTOMY;  Surgeon: Johnn Hai, MD;  Location: [facility];  Service: Orthopedics;  Laterality: Right;  Re-Do Lumbar Decompression L5-S1 on Right,    PILONIDAL CYST EXCISION      Home Medications    Prior to Admission medications   Medication Sig Start Date End Date Taking? Authorizing Provider  cetirizine (ZYRTEC) 10 MG tablet Take 10 mg by mouth  2 (two) times daily.     [provider]  citalopram (CELEXA) 40 MG tablet Take 1 tablet by mouth daily. 03/08/21   Wendie Agreste, MD  COVID-19 At Home Antigen Test [facility] COVID-19 HOME TEST) KIT Use as directed 03/07/21   Edmon Crape, RPH  fluticasone [facility] 50 MCG/ACT nasal spray Place 2 sprays into both nostrils daily.    [provider]  guaifenesin (HUMIBID E) 400 MG TABS tablet Take 1 tablet 3 times daily as needed for chest congestion  and cough 03/03/21   Lynden Oxford Scales, [name]  ipratropium (ATROVENT) 0.06 % nasal spray Place 2 sprays into both nostrils 4 (four) times daily. As needed for nasal congestion, runny nose 03/03/21   Lynden Oxford Scales, [name]  losartan (COZAAR) 100 MG tablet TAKE 1 TABLET BY MOUTH ONCE DAILY 12/02/19 12/01/20  Corrington, Kip A, MD  losartan (COZAAR) 100 MG tablet TAKE 1 TABLET BY MOUTH ONCE A DAY 05/07/20 05/07/21  Wendie Agreste, MD  methocarbamol (ROBAXIN) 500 MG tablet TAKE 1 TABLET BY MOUTH EVERY 6 HOURS AS NEEDED FOR MUSCLE SPASMS 07/08/20 07/08/21  Wendie Agreste, MD  montelukast (SINGULAIR) 10 MG tablet TAKE 1 TABLET BY MOUTH ONCE A DAY 05/07/20 05/07/21  Wendie Agreste, MD  NEEDLE, DISP, 18 G (BD DISP NEEDLES) 18G X 1-1/2" MISC See admin instructions. 03/11/19   [provider]  NEEDLE, DISP, 23 G 23G X 1" MISC See admin instructions. 10/05/17   [provider]  nitrofurantoin, macrocrystal-monohydrate, (MACROBID) 100 MG capsule Take 1 capsule (100 mg total) by mouth 2 (two) times daily. 03/08/21   LampteyMyrene Galas, MD  oseltamivir (TAMIFLU) 75 MG capsule Take 1 capsule (75 mg total) by mouth 2 (two) times daily. 03/01/21   Montine Circle, [name]  pantoprazole (PROTONIX) 40 MG tablet TAKE 1 TABLET BY MOUTH ONCE A DAY 05/07/20 05/07/21  Wendie Agreste, MD  predniSONE (STERAPRED UNI-PAK 21 TAB) 10 MG (21) TBPK tablet Use as directed 02/02/21   Evelina Dun A, FNP  promethazine-dextromethorphan (PROMETHAZINE-DM) 6.25-15 MG/5ML syrup Take 5 mLs by mouth 4 (four) times daily as needed for cough. 03/03/21   Lynden Oxford Scales, [name]  Semaglutide-Weight Management 2.4 MG/0.75ML SOAJ INJECT 2.4MG UNDER THE SKIN ONCE A WEEK 05/22/20 05/22/21  Wendie Agreste, MD  testosterone cypionate (DEPOTESTOSTERONE CYPIONATE) 200 MG/ML injection INJECT 1 ML (200 MG TOTAL) INTO THE MUSCLE EVERY 14 (FOURTEEN) DAYS. 05/07/20 11/03/20  Wendie Agreste, MD  Vitamin D, Ergocalciferol, (DRISDOL) 1.25 MG  (50000 UNIT) CAPS capsule TAKE 1 CAPSULE BY MOUTH EVERY 7 DAYS 02/08/21 02/08/22  Wendie Agreste, MD   Family History Family History  Problem Relation Age of Onset   COPD Father    Congestive Heart Failure Father    Breast cancer Mother    Social History Social History   Tobacco Use   Smoking status: Former    Packs/day: 1.00    Years: 20.00    Pack years: 20.00    Types: Cigarettes    Quit date: 10/05/2011    Years since quitting: 9.4   Smokeless tobacco: Former    Types: Chew  Substance Use Topics   Alcohol use: Yes    Alcohol/week: 0.0 standard drinks    Comment: 2 MIXED DRINKS (VODKA) DAILY-usually    Drug use: No   Allergies   Other  Review of Systems Review of Systems Pertinent findings noted in history of present illness.   Physical Exam Triage Vital Signs  ED Triage Vitals  Enc Vitals Group     BP 12/31/20 0827 (!) 147/82     Pulse Rate 12/31/20 0827 72     Resp 12/31/20 0827 18     Temp 12/31/20 0827 98.3 F (36.8 C)     Temp Source 12/31/20 0827 Oral     SpO2 12/31/20 0827 98 %     Weight --      Height --      Head Circumference --      Peak Flow --      Pain Score 12/31/20 0826 5     Pain Loc --      Pain Edu? --      Excl. in [place]? --   No data found.  Updated Vital Signs BP (!) 143/89 (BP Location: Left Arm)    Pulse 82    Temp 98.4 F (36.9 C) (Oral)    Resp 20    SpO2 98%   Physical Exam Vitals and nursing note reviewed.  Constitutional:      General: He is not in acute distress.    Appearance: Normal appearance. He is not ill-appearing.  HENT:     Head: Normocephalic and atraumatic.     Salivary Glands: Right salivary gland is not diffusely enlarged or tender. Left salivary gland is not diffusely enlarged or tender.     Right Ear: Ear canal and external ear normal. No drainage. A middle ear effusion is present. There is no impacted cerumen. Tympanic membrane is bulging. Tympanic membrane is not injected or erythematous.     Left Ear:  Ear canal and external ear normal. No drainage. A middle ear effusion is present. There is no impacted cerumen. Tympanic membrane is bulging. Tympanic membrane is not injected or erythematous.     Ears:     Comments: Bilateral EACs normal, both TMs bulging with clear fluid    Nose: Congestion and rhinorrhea present. No nasal deformity, septal deviation, signs of injury, nasal tenderness or mucosal edema. Rhinorrhea is clear.     Right Nostril: Occlusion present. No foreign body, epistaxis or septal hematoma.     Left Nostril: Occlusion present. No foreign body, epistaxis or septal hematoma.     Right Turbinates: Enlarged, swollen and pale.     Left Turbinates: Enlarged, swollen and pale.     Right Sinus: No maxillary sinus tenderness or frontal sinus tenderness.     Left Sinus: No maxillary sinus tenderness or frontal sinus tenderness.     Mouth/Throat:     Lips: Pink. No lesions.     Mouth: Mucous membranes are moist. No oral lesions.     Tongue: No lesions.     Palate: No mass.     Pharynx: Oropharynx is clear. Uvula midline. No pharyngeal swelling, oropharyngeal exudate, posterior oropharyngeal erythema or uvula swelling.     Tonsils: No tonsillar exudate. 0 on the right. 0 on the left.     Comments: Postnasal drip Eyes:     General: Lids are normal. Lids are everted, no foreign bodies appreciated. Vision grossly intact. No allergic shiner or scleral icterus.       Right eye: No discharge.        Left eye: No discharge.     Extraocular Movements: Extraocular movements intact.     Conjunctiva/sclera: Conjunctivae normal.     Right eye: Right conjunctiva is not injected. No exudate.    Left eye: Left conjunctiva is not injected. No exudate. Neck:  Trachea: Trachea and phonation normal.  Cardiovascular:     Rate and Rhythm: Normal rate and regular rhythm.     Pulses: Normal pulses.     Heart sounds: Normal heart sounds. No murmur heard.   No friction rub. No gallop.  Pulmonary:      Effort: Pulmonary effort is normal. No accessory muscle usage, prolonged expiration or respiratory distress.     Breath sounds: Normal breath sounds. No stridor, decreased air movement or transmitted upper airway sounds. No decreased breath sounds, wheezing, rhonchi or rales.  Chest:     Chest wall: No tenderness.  Musculoskeletal:        General: Normal range of motion.     Cervical back: Normal range of motion and neck supple. Normal range of motion.  Lymphadenopathy:     Cervical: No cervical adenopathy.  Skin:    General: Skin is warm and dry.     Findings: No erythema or rash.  Neurological:     General: No focal deficit present.     Mental Status: He is alert and oriented to person, place, and time.  Psychiatric:        Mood and Affect: Mood normal.        Behavior: Behavior normal.    Visual Acuity Right Eye Distance:   Left Eye Distance:   Bilateral Distance:    Right Eye Near:   Left Eye Near:    Bilateral Near:     [facility] Couse / Diagnostics / Procedures:    EKG  Radiology No results found.  Procedures Procedures (including critical care time)  UC Diagnoses / Final Clinical Impressions(s)   I have reviewed the triage vital signs and the nursing notes.  Pertinent labs & imaging results that were available during my care of the patient were reviewed by me and considered in my medical decision making (see chart for details).   Final diagnoses:  Encounter for screening for viral disease  History of febrile urinary tract infection  Nasal congestion  House dust mite allergy  Allergy to environmental factors  Allergic disorder of respiratory system, subsequent encounter  Bronchospasm  Wheezing   The most likely cause of patient's severe nasal congestion, in my opinion, is environmental allergies.  At his employer's request, we have performed COVID and influenza testing and we will notify him of the results once received.  I have advised patient that I recommend  performing a CBC with differential to evaluate his eosinophil count and to repeat his urinalysis to ensure that his UTI is resolving.  ED Prescriptions     Medication Sig Dispense Auth. Provider   fluticasone furoate-vilanterol (BREO ELLIPTA) 200-25 MCG/ACT AEPB Inhale 1 puff into the lungs daily. 60 each Lynden Oxford Scales, [name]      PDMP not reviewed this encounter.  Pending results:  Labs Reviewed  COVID-19, FLU A+B AND RSV  URINE CULTURE  CBC WITH DIFFERENTIAL/PLATELET  POCT URINALYSIS DIP (MANUAL ENTRY)    Medications Ordered in UC: Medications - No data to display  Disposition Upon Discharge:  Condition: stable for discharge home Home: take medications as prescribed; routine discharge instructions as discussed; follow up as advised.  Patient presented with an acute illness with associated systemic symptoms and significant discomfort requiring urgent management. In my opinion, this is a condition that a prudent lay person (someone who possesses an average knowledge of health and medicine) may potentially expect to result in complications if not addressed urgently such as respiratory distress, impairment of bodily  function or dysfunction of bodily organs.   Routine symptom specific, illness specific and/or disease specific instructions were discussed with the patient and/or caregiver at length.   As such, the patient has been evaluated and assessed, work-up was performed and treatment was provided in alignment with urgent care protocols and evidence based medicine.  Patient/parent/caregiver has been advised that the patient may require follow up for further testing and treatment if the symptoms continue in spite of treatment, as clinically indicated and appropriate.  If the patient was tested for COVID-19, Influenza and/or RSV, then the patient/parent/guardian was advised to isolate at home pending the results of his/her diagnostic coronavirus test and potentially longer if  theyre positive. I have also advised pt that if his/her COVID-19 test returns positive, it's recommended to self-isolate for at least 10 days after symptoms first appeared AND until fever-free for 24 hours without fever reducer AND other symptoms have improved or resolved. Discussed self-isolation recommendations as well as instructions for household member/close contacts as per the [facility] and [place] [facility]HS, and also gave patient the [place] packet with this information.  Patient/parent/caregiver has been advised to return to the [facility] or PCP in 3-5 days if no better; to PCP or the Emergency Department if new signs and symptoms develop, or if the current signs or symptoms continue to change or worsen for further workup, evaluation and treatment as clinically indicated and appropriate  The patient will follow up with their current PCP if and as advised. If the patient does not currently have a PCP we will assist them in obtaining one.   The patient may need specialty follow up if the symptoms continue, in spite of conservative treatment and management, for further workup, evaluation, consultation and treatment as clinically indicated and appropriate.  Patient/parent/caregiver verbalized understanding and agreement of plan as discussed.  All questions were addressed during visit.  Please see discharge instructions below for further details of plan.  Discharge Instructions:   Discharge Instructions      I appreciate your patience and repeating the testing that we performed last time you are here.  You did obviously have signs of infection on your blood count last time.  I am glad we are repeating this today.  Your urinalysis today was unremarkable, we will also perform the urine culture as before.  Please do finish all of the Macrobid.  I would like to hold off on the steroid injection and tapering dose of oral steroids for now while we wait for the results of your COVID, flu tests and CBC.  If your viral testing  is negative and you do not show any signs of infection on your CBC, I will strongly recommend that you begin steroids and we will send a prescription for you to your pharmacy.  In the meantime, please continue all of your allergy medications including Zyrtec, Flonase, Singulair.  Be sure that you are using 2 sprays of Flonase every day.  I have added 2 more medications, Astelin nasal spray and famotidine.  Famotidine is an interesting medication, addresses heartburn but also can address allergy symptoms as well.  There is a wonderful company that sells pillow and mattress and casings along with anything else beneficial for dust mite allergy suffers.  Their website is www.[employer].com.  I strongly recommend that you consider buying encasements for at least your mattress and pillows.  Is also very important that she wash all of your bedding in hot water every 2 weeks or more often, by the time  your eyes are watering, your sinuses and upper airway already have been acutely inflamed do not use that as your cue to clean.  I also recommend that you reach out to [facility] for repeat allergy testing; Dr. Lance Coon is excellent.  Their phone number is [phone].  I do recommend that you begin a steroid inhaler called Breo, 1 puff daily, for your upper airway inflammation, you did have significant bronchospasm and mild wheezing on exam today.  I have sent this prescription to your pharmacy.  Please be sure to follow-up with your regular provider within the next few weeks to check your progress and discuss further treatment options.  We will notify you of all your test results as soon as they are available.  I personally will keep a note on my computer to make sure to check daily for your results.  Thank you for your confidence in my care, I appreciate you visiting me a second time.  I sincerely hope that we can can get you feeling better soon.       This office  note has been dictated using Museum/gallery curator.  Unfortunately, and despite my best efforts, this method of dictation can sometimes lead to occasional typographical or grammatical errors.  I apologize in advance if this occurs.     Lynden Oxford Scales, [name] 03/17/21 1022

## 2021-03-17 NOTE — ED Triage Notes (Signed)
Pt reports the last two days he has had a fever and cough, ear fullness, and congestion.

## 2021-03-17 NOTE — Discharge Instructions (Signed)
I appreciate your patience and repeating the testing that we performed last time you are here.  You did obviously have signs of infection on your blood count last time.  I am glad we are repeating this today.  Your urinalysis today was unremarkable, we will also perform the urine culture as before.  Please do finish all of the Macrobid.  I would like to hold off on the steroid injection and tapering dose of oral steroids for now while we wait for the results of your COVID, flu tests and CBC.  If your viral testing is negative and you do not show any signs of infection on your CBC, I will strongly recommend that you begin steroids and we will send a prescription for you to your pharmacy.  In the meantime, please continue all of your allergy medications including Zyrtec, Flonase, Singulair.  Be sure that you are using 2 sprays of Flonase every day.  I have added 2 more medications, Astelin nasal spray and famotidine.  Famotidine is an interesting medication, addresses heartburn but also can address allergy symptoms as well.  There is a wonderful company that sells pillow and mattress and casings along with anything else beneficial for dust mite allergy suffers.  Their website is www.[employer].com.  I strongly recommend that you consider buying encasements for at least your mattress and pillows.  Is also very important that she wash all of your bedding in hot water every 2 weeks or more often, by the time your eyes are watering, your sinuses and upper airway already have been acutely inflamed do not use that as your cue to clean.  I also recommend that you reach out to [facility] for repeat allergy testing; Dr. Hoy Register is excellent.  Their phone number is [phone].  I do recommend that you begin a steroid inhaler called Breo, 1 puff daily, for your upper airway inflammation, you did have significant bronchospasm and mild wheezing on exam today.  I have sent this  prescription to your pharmacy.  Please be sure to follow-up with your regular provider within the next few weeks to check your progress and discuss further treatment options.  We will notify you of all your test results as soon as they are available.  I personally will keep a note on my computer to make sure to check daily for your results.  Thank you for your confidence in my care, I appreciate you visiting me a second time.  I sincerely hope that we can can get you feeling better soon.

## 2021-03-18 ENCOUNTER — Telehealth (HOSPITAL_COMMUNITY): Payer: Self-pay | Admitting: Emergency Medicine

## 2021-03-18 LAB — COVID-19, FLU A+B AND RSV
Influenza A, NAA: NOT DETECTED
Influenza B, NAA: NOT DETECTED
RSV, NAA: NOT DETECTED
SARS-CoV-2, NAA: NOT DETECTED

## 2021-03-18 LAB — CBC WITH DIFFERENTIAL/PLATELET
Basophils Absolute: 0.1 10*3/uL (ref 0.0–0.2)
Basos: 1 %
EOS (ABSOLUTE): 0.3 10*3/uL (ref 0.0–0.4)
Eos: 3 %
Hematocrit: 45.2 % (ref 37.5–51.0)
Hemoglobin: 16 g/dL (ref 13.0–17.7)
Immature Grans (Abs): 0.1 10*3/uL (ref 0.0–0.1)
Immature Granulocytes: 1 %
Lymphocytes Absolute: 1.7 10*3/uL (ref 0.7–3.1)
Lymphs: 14 %
MCH: 33.3 pg — ABNORMAL HIGH (ref 26.6–33.0)
MCHC: 35.4 g/dL (ref 31.5–35.7)
MCV: 94 fL (ref 79–97)
Monocytes Absolute: 1.1 10*3/uL — ABNORMAL HIGH (ref 0.1–0.9)
Monocytes: 9 %
Neutrophils Absolute: 9.1 10*3/uL — ABNORMAL HIGH (ref 1.4–7.0)
Neutrophils: 72 %
Platelets: 315 10*3/uL (ref 150–450)
RBC: 4.81 x10E6/uL (ref 4.14–5.80)
RDW: 11.6 % (ref 11.6–15.4)
WBC: 12.4 10*3/uL — ABNORMAL HIGH (ref 3.4–10.8)

## 2021-03-18 LAB — URINE CULTURE: Culture: NO GROWTH

## 2021-03-18 MED ORDER — LEVOFLOXACIN 500 MG PO TABS: 750.0000 mg | ORAL_TABLET | Freq: Every day | ORAL | 0 refills | Status: AC

## 2021-03-25 ENCOUNTER — Other Ambulatory Visit (HOSPITAL_BASED_OUTPATIENT_CLINIC_OR_DEPARTMENT_OTHER): Payer: Self-pay

## 2021-03-25 ENCOUNTER — Other Ambulatory Visit (HOSPITAL_COMMUNITY): Payer: Self-pay

## 2021-03-25 MED FILL — Losartan Potassium Tab 100 MG: ORAL | 60 days supply | Qty: 60 | Fill #2 | Status: AC

## 2021-03-25 MED FILL — Losartan Potassium Tab 100 MG: ORAL | 60 days supply | Qty: 60 | Fill #2 | Status: CN

## 2021-04-04 ENCOUNTER — Other Ambulatory Visit: Payer: Self-pay | Admitting: Family Medicine

## 2021-04-04 ENCOUNTER — Other Ambulatory Visit (HOSPITAL_BASED_OUTPATIENT_CLINIC_OR_DEPARTMENT_OTHER): Payer: Self-pay

## 2021-04-04 ENCOUNTER — Other Ambulatory Visit (HOSPITAL_COMMUNITY): Payer: Self-pay

## 2021-04-04 DIAGNOSIS — G8929 Other chronic pain: Secondary | ICD-10-CM

## 2021-04-04 DIAGNOSIS — M5442 Lumbago with sciatica, left side: Secondary | ICD-10-CM

## 2021-04-04 MED FILL — Montelukast Sodium Tab 10 MG (Base Equiv): ORAL | 90 days supply | Qty: 90 | Fill #2 | Status: CN

## 2021-04-05 ENCOUNTER — Other Ambulatory Visit (HOSPITAL_BASED_OUTPATIENT_CLINIC_OR_DEPARTMENT_OTHER): Payer: Self-pay

## 2021-04-05 ENCOUNTER — Other Ambulatory Visit (HOSPITAL_COMMUNITY): Payer: Self-pay

## 2021-04-05 ENCOUNTER — Other Ambulatory Visit: Payer: Self-pay | Admitting: Family Medicine

## 2021-04-05 DIAGNOSIS — G8929 Other chronic pain: Secondary | ICD-10-CM

## 2021-04-05 MED ORDER — METHOCARBAMOL 500 MG PO TABS
500.0000 mg | ORAL_TABLET | Freq: Four times a day (QID) | ORAL | 1 refills | Status: DC | PRN
  Filled 2021-04-05: qty 30, 7d supply, fill #0
  Filled 2021-04-13: qty 30, 8d supply, fill #0
  Filled 2021-09-12: qty 30, 8d supply, fill #1

## 2021-04-05 MED FILL — Pantoprazole Sodium EC Tab 40 MG (Base Equiv): ORAL | 90 days supply | Qty: 90 | Fill #2 | Status: CN

## 2021-04-05 NOTE — Telephone Encounter (Signed)
Low back pain treated previously with intermittent dosing, refilled Robaxin

## 2021-04-05 NOTE — Telephone Encounter (Signed)
Patient is requesting a refill of the following medications: Requested Prescriptions   Pending Prescriptions Disp Refills   methocarbamol (ROBAXIN) 500 MG tablet 30 tablet 1    Sig: TAKE 1 TABLET BY MOUTH EVERY 6 HOURS AS NEEDED FOR MUSCLE SPASMS    Date of patient request: 04/05/21 Last office visit: 05/07/20 Date of last refill: 07/08/20 Last refill amount: 30 x1R

## 2021-04-13 ENCOUNTER — Other Ambulatory Visit (HOSPITAL_COMMUNITY): Payer: Self-pay

## 2021-04-13 ENCOUNTER — Other Ambulatory Visit (HOSPITAL_BASED_OUTPATIENT_CLINIC_OR_DEPARTMENT_OTHER): Payer: Self-pay

## 2021-04-13 MED FILL — Montelukast Sodium Tab 10 MG (Base Equiv): ORAL | 90 days supply | Qty: 90 | Fill #2 | Status: AC

## 2021-04-13 MED FILL — Pantoprazole Sodium EC Tab 40 MG (Base Equiv): ORAL | 90 days supply | Qty: 90 | Fill #2 | Status: AC

## 2021-04-14 ENCOUNTER — Other Ambulatory Visit (HOSPITAL_BASED_OUTPATIENT_CLINIC_OR_DEPARTMENT_OTHER): Payer: Self-pay

## 2021-06-14 ENCOUNTER — Other Ambulatory Visit: Payer: Self-pay | Admitting: Family Medicine

## 2021-06-14 ENCOUNTER — Other Ambulatory Visit (HOSPITAL_BASED_OUTPATIENT_CLINIC_OR_DEPARTMENT_OTHER): Payer: Self-pay

## 2021-06-14 DIAGNOSIS — I10 Essential (primary) hypertension: Secondary | ICD-10-CM

## 2021-06-14 DIAGNOSIS — E559 Vitamin D deficiency, unspecified: Secondary | ICD-10-CM

## 2021-06-15 ENCOUNTER — Other Ambulatory Visit (HOSPITAL_BASED_OUTPATIENT_CLINIC_OR_DEPARTMENT_OTHER): Payer: Self-pay

## 2021-06-15 MED ORDER — VITAMIN D (ERGOCALCIFEROL) 1.25 MG (50000 UNIT) PO CAPS
ORAL_CAPSULE | ORAL | 3 refills | Status: DC
  Filled 2021-06-15: qty 12, 84d supply, fill #0
  Filled 2021-09-12: qty 12, 84d supply, fill #1
  Filled 2021-11-08: qty 8, 56d supply, fill #1

## 2021-06-15 MED ORDER — LOSARTAN POTASSIUM 100 MG PO TABS
100.0000 mg | ORAL_TABLET | Freq: Every day | ORAL | 2 refills | Status: DC
  Filled 2021-06-15: qty 90, 90d supply, fill #0
  Filled 2021-09-12: qty 90, 90d supply, fill #1

## 2021-07-01 ENCOUNTER — Other Ambulatory Visit (HOSPITAL_BASED_OUTPATIENT_CLINIC_OR_DEPARTMENT_OTHER): Payer: Self-pay

## 2021-08-10 ENCOUNTER — Other Ambulatory Visit (HOSPITAL_BASED_OUTPATIENT_CLINIC_OR_DEPARTMENT_OTHER): Payer: Self-pay

## 2021-08-10 ENCOUNTER — Other Ambulatory Visit: Payer: Self-pay | Admitting: Family Medicine

## 2021-08-10 DIAGNOSIS — J309 Allergic rhinitis, unspecified: Secondary | ICD-10-CM

## 2021-08-10 DIAGNOSIS — K219 Gastro-esophageal reflux disease without esophagitis: Secondary | ICD-10-CM

## 2021-08-11 ENCOUNTER — Other Ambulatory Visit (HOSPITAL_BASED_OUTPATIENT_CLINIC_OR_DEPARTMENT_OTHER): Payer: Self-pay

## 2021-08-11 MED ORDER — MONTELUKAST SODIUM 10 MG PO TABS
ORAL_TABLET | Freq: Every day | ORAL | 3 refills | Status: DC
  Filled 2021-08-11: qty 90, 90d supply, fill #0
  Filled 2021-09-12 – 2021-11-08 (×2): qty 90, 90d supply, fill #1

## 2021-08-11 MED ORDER — PANTOPRAZOLE SODIUM 40 MG PO TBEC
DELAYED_RELEASE_TABLET | Freq: Every day | ORAL | 3 refills | Status: DC
  Filled 2021-08-11: qty 90, 90d supply, fill #0
  Filled 2021-09-12: qty 90, 90d supply, fill #1

## 2021-09-12 ENCOUNTER — Other Ambulatory Visit (HOSPITAL_BASED_OUTPATIENT_CLINIC_OR_DEPARTMENT_OTHER): Payer: Self-pay

## 2021-09-12 ENCOUNTER — Other Ambulatory Visit: Payer: Self-pay | Admitting: Family Medicine

## 2021-09-12 DIAGNOSIS — E291 Testicular hypofunction: Secondary | ICD-10-CM

## 2021-09-19 ENCOUNTER — Other Ambulatory Visit (HOSPITAL_BASED_OUTPATIENT_CLINIC_OR_DEPARTMENT_OTHER): Payer: Self-pay

## 2021-11-02 ENCOUNTER — Other Ambulatory Visit: Payer: Self-pay | Admitting: Family Medicine

## 2021-11-08 ENCOUNTER — Other Ambulatory Visit (HOSPITAL_BASED_OUTPATIENT_CLINIC_OR_DEPARTMENT_OTHER): Payer: Self-pay

## 2021-11-08 ENCOUNTER — Other Ambulatory Visit: Payer: Self-pay | Admitting: Family Medicine

## 2021-11-08 DIAGNOSIS — E291 Testicular hypofunction: Secondary | ICD-10-CM

## 2021-11-09 ENCOUNTER — Other Ambulatory Visit (HOSPITAL_BASED_OUTPATIENT_CLINIC_OR_DEPARTMENT_OTHER): Payer: Self-pay

## 2021-11-09 ENCOUNTER — Telehealth: Payer: No Typology Code available for payment source | Admitting: Physician Assistant

## 2021-11-09 DIAGNOSIS — E559 Vitamin D deficiency, unspecified: Secondary | ICD-10-CM

## 2021-11-09 DIAGNOSIS — J309 Allergic rhinitis, unspecified: Secondary | ICD-10-CM

## 2021-11-09 DIAGNOSIS — I10 Essential (primary) hypertension: Secondary | ICD-10-CM | POA: Diagnosis not present

## 2021-11-09 DIAGNOSIS — K219 Gastro-esophageal reflux disease without esophagitis: Secondary | ICD-10-CM

## 2021-11-09 DIAGNOSIS — F339 Major depressive disorder, recurrent, unspecified: Secondary | ICD-10-CM

## 2021-11-09 DIAGNOSIS — M5442 Lumbago with sciatica, left side: Secondary | ICD-10-CM | POA: Diagnosis not present

## 2021-11-09 DIAGNOSIS — G8929 Other chronic pain: Secondary | ICD-10-CM

## 2021-11-09 MED ORDER — VITAMIN D (ERGOCALCIFEROL) 1.25 MG (50000 UNIT) PO CAPS
ORAL_CAPSULE | ORAL | 0 refills | Status: DC
  Filled 2021-11-10: qty 12, 84d supply, fill #0

## 2021-11-09 MED ORDER — CITALOPRAM HYDROBROMIDE 40 MG PO TABS
40.0000 mg | ORAL_TABLET | Freq: Every day | ORAL | 0 refills | Status: DC
  Filled 2021-11-09: qty 90, 90d supply, fill #0

## 2021-11-09 MED ORDER — MONTELUKAST SODIUM 10 MG PO TABS
10.0000 mg | ORAL_TABLET | Freq: Every day | ORAL | 0 refills | Status: DC
  Filled 2021-11-10: qty 90, 90d supply, fill #0

## 2021-11-09 MED ORDER — METHOCARBAMOL 500 MG PO TABS
500.0000 mg | ORAL_TABLET | Freq: Four times a day (QID) | ORAL | 0 refills | Status: DC | PRN
  Filled 2021-11-09: qty 90, 23d supply, fill #0

## 2021-11-09 MED ORDER — PANTOPRAZOLE SODIUM 40 MG PO TBEC
40.0000 mg | DELAYED_RELEASE_TABLET | Freq: Every day | ORAL | 0 refills | Status: DC
  Filled 2021-11-09: qty 90, 90d supply, fill #0

## 2021-11-09 MED ORDER — LOSARTAN POTASSIUM 100 MG PO TABS
100.0000 mg | ORAL_TABLET | Freq: Every day | ORAL | 0 refills | Status: DC
  Filled 2021-11-09 – 2021-11-10 (×2): qty 90, 90d supply, fill #0

## 2021-11-09 NOTE — Progress Notes (Signed)
Virtual Visit Consent   Ryan Holland, you are scheduled for a virtual visit with a [facility] provider today. Just as with appointments in the office, your consent must be obtained to participate. Your consent will be active for this visit and any virtual visit you may have with one of our providers in the next 365 days. If you have a MyChart account, a copy of this consent can be sent to you electronically.  As this is a virtual visit, video technology does not allow for your provider to perform a traditional examination. This may limit your provider's ability to fully assess your condition. If your provider identifies any concerns that need to be evaluated in person or the need to arrange testing (such as labs, EKG, etc.), we will make arrangements to do so. Although advances in technology are sophisticated, we cannot ensure that it will always work on either your end or our end. If the connection with a video visit is poor, the visit may have to be switched to a telephone visit. With either a video or telephone visit, we are not always able to ensure that we have a secure connection.  By engaging in this virtual visit, you consent to the provision of healthcare and authorize for your insurance to be billed (if applicable) for the services provided during this visit. Depending on your insurance coverage, you may receive a charge related to this service.  I need to obtain your verbal consent now. Are you willing to proceed with your visit today? Ryan Holland has provided verbal consent on 11/09/2021 for a virtual visit (video or telephone). Mar Daring, [name]  Date: 11/09/2021 6:25 PM  Virtual Visit via Video Note   I, Mar Daring, connected with  Ryan Holland  ([id number], 08/21/1970) on 11/09/21 at  6:30 PM EDT by a video-enabled telemedicine application and verified that I am speaking with the correct person using two identifiers.  Location: Patient: Virtual Visit Location  Patient: Home Provider: Virtual Visit Location Provider: Home Office   I discussed the limitations of evaluation and management by telemedicine and the availability of in person appointments. The patient expressed understanding and agreed to proceed.    History of Present Illness: Ryan Holland is a 51 y.o. who identifies as a male who was assigned male at birth, and is being seen today for medication refills. He has not established with a new provider since his provider left the office. He is needing refills of all chronic medications until he can re-establish.    Problems:  Patient Active Problem List   Diagnosis Date Noted   Hypogonadism in male 05/07/2020   Vitamin D deficiency 05/07/2020   Cough 08/25/2014   Acid reflux 08/25/2014   Allergic rhinitis 08/25/2014    Allergies:  Allergies  Allergen Reactions   Other     LEMON-sores in mouth   Medications:  Current Outpatient Medications:    cetirizine (ZYRTEC) 10 MG tablet, Take 10 mg by mouth 2 (two) times daily. , Disp: , Rfl:    citalopram (CELEXA) 40 MG tablet, Take 1 tablet by mouth daily., Disp: 90 tablet, Rfl: 0   COVID-19 At Home Antigen Test (CARESTART COVID-19 HOME TEST) KIT, Use as directed, Disp: 4 each, Rfl: 0   fluticasone (FLONASE) 50 MCG/ACT nasal spray, Place 2 sprays into both nostrils daily., Disp: , Rfl:    fluticasone furoate-vilanterol (BREO ELLIPTA) 200-25 MCG/ACT AEPB, Inhale 1 puff into the lungs daily., Disp: 60 each, Rfl: 1  losartan (COZAAR) 100 MG tablet, TAKE 1 TABLET BY MOUTH ONCE A DAY, Disp: 90 tablet, Rfl: 0   methocarbamol (ROBAXIN) 500 MG tablet, Take 1 tablet (500 mg total) by mouth every 6 (six) hours as needed for muscle spasms., Disp: 90 tablet, Rfl: 0   montelukast (SINGULAIR) 10 MG tablet, TAKE 1 TABLET BY MOUTH ONCE A DAY, Disp: 90 tablet, Rfl: 0   pantoprazole (PROTONIX) 40 MG tablet, TAKE 1 TABLET BY MOUTH ONCE A DAY, Disp: 90 tablet, Rfl: 0   testosterone cypionate  (DEPOTESTOSTERONE CYPIONATE) 200 MG/ML injection, INJECT 1 ML (200 MG TOTAL) INTO THE MUSCLE EVERY 14 (FOURTEEN) DAYS., Disp: 6 mL, Rfl: 1   Vitamin D, Ergocalciferol, (DRISDOL) 1.25 MG (50000 UNIT) CAPS capsule, TAKE 1 CAPSULE BY MOUTH EVERY 7 DAYS, Disp: 12 capsule, Rfl: 0  Observations/Objective: Patient is well-developed, well-nourished in no acute distress.  Resting comfortably at home.  Head is normocephalic, atraumatic.  No labored breathing.  Speech is clear and coherent with logical content.  Patient is alert and oriented at baseline.    Assessment and Plan: 1. Essential hypertension - losartan (COZAAR) 100 MG tablet; TAKE 1 TABLET BY MOUTH ONCE A DAY  Dispense: 90 tablet; Refill: 0  2. Chronic low back pain with left-sided sciatica, unspecified back pain laterality - methocarbamol (ROBAXIN) 500 MG tablet; Take 1 tablet (500 mg total) by mouth every 6 (six) hours as needed for muscle spasms.  Dispense: 90 tablet; Refill: 0  3. Allergic rhinitis, unspecified seasonality, unspecified trigger - montelukast (SINGULAIR) 10 MG tablet; TAKE 1 TABLET BY MOUTH ONCE A DAY  Dispense: 90 tablet; Refill: 0  4. Gastroesophageal reflux disease, unspecified whether esophagitis present - pantoprazole (PROTONIX) 40 MG tablet; TAKE 1 TABLET BY MOUTH ONCE A DAY  Dispense: 90 tablet; Refill: 0  5. Vitamin D deficiency - Vitamin D, Ergocalciferol, (DRISDOL) 1.25 MG (50000 UNIT) CAPS capsule; TAKE 1 CAPSULE BY MOUTH EVERY 7 DAYS  Dispense: 12 capsule; Refill: 0  6. Depression, recurrent (Divide) - citalopram (CELEXA) 40 MG tablet; Take 1 tablet by mouth daily.  Dispense: 90 tablet; Refill: 0  - All medications refilled for 90 days once - Advised he will need to establish with a new PCP for long-term management and future refills - Discussed using link in AVS to schedule new patient appointment  Follow Up Instructions: I discussed the assessment and treatment plan with the patient. The patient was  provided an opportunity to ask questions and all were answered. The patient agreed with the plan and demonstrated an understanding of the instructions.  A copy of instructions were sent to the patient via MyChart unless otherwise noted below.    The patient was advised to call back or seek an in-person evaluation if the symptoms worsen or if the condition fails to improve as anticipated.  Time:  I spent 10 minutes with the patient via telehealth technology discussing the above problems/concerns.    Mar Daring, [name]

## 2021-11-09 NOTE — Patient Instructions (Signed)
Ryan Holland, thank you for joining Mar Daring, [name] for today's virtual visit.  While this provider is not your primary care provider (PCP), if your PCP is located in our provider database this encounter information will be shared with them immediately following your visit.  Consent: (Patient) Ryan Holland provided verbal consent for this virtual visit at the beginning of the encounter.  Current Medications:  Current Outpatient Medications:    cetirizine (ZYRTEC) 10 MG tablet, Take 10 mg by mouth 2 (two) times daily. , Disp: , Rfl:    citalopram (CELEXA) 40 MG tablet, Take 1 tablet by mouth daily., Disp: 90 tablet, Rfl: 0   COVID-19 At Home Antigen Test (CARESTART COVID-19 HOME TEST) KIT, Use as directed, Disp: 4 each, Rfl: 0   fluticasone (FLONASE) 50 MCG/ACT nasal spray, Place 2 sprays into both nostrils daily., Disp: , Rfl:    fluticasone furoate-vilanterol (BREO ELLIPTA) 200-25 MCG/ACT AEPB, Inhale 1 puff into the lungs daily., Disp: 60 each, Rfl: 1   losartan (COZAAR) 100 MG tablet, TAKE 1 TABLET BY MOUTH ONCE A DAY, Disp: 90 tablet, Rfl: 0   methocarbamol (ROBAXIN) 500 MG tablet, Take 1 tablet (500 mg total) by mouth every 6 (six) hours as needed for muscle spasms., Disp: 90 tablet, Rfl: 0   montelukast (SINGULAIR) 10 MG tablet, TAKE 1 TABLET BY MOUTH ONCE A DAY, Disp: 90 tablet, Rfl: 0   pantoprazole (PROTONIX) 40 MG tablet, TAKE 1 TABLET BY MOUTH ONCE A DAY, Disp: 90 tablet, Rfl: 0   testosterone cypionate (DEPOTESTOSTERONE CYPIONATE) 200 MG/ML injection, INJECT 1 ML (200 MG TOTAL) INTO THE MUSCLE EVERY 14 (FOURTEEN) DAYS., Disp: 6 mL, Rfl: 1   Vitamin D, Ergocalciferol, (DRISDOL) 1.25 MG (50000 UNIT) CAPS capsule, TAKE 1 CAPSULE BY MOUTH EVERY 7 DAYS, Disp: 12 capsule, Rfl: 0   Medications ordered in this encounter:  Meds ordered this encounter  Medications   citalopram (CELEXA) 40 MG tablet    Sig: Take 1 tablet by mouth daily.    Dispense:  90 tablet    Refill:   0    Order Specific Question:   Supervising Provider    Answer:   MILLER, BRIAN [[id number]]   losartan (COZAAR) 100 MG tablet    Sig: TAKE 1 TABLET BY MOUTH ONCE A DAY    Dispense:  90 tablet    Refill:  0    Order Specific Question:   Supervising Provider    Answer:   MILLER, BRIAN [[id number]]   methocarbamol (ROBAXIN) 500 MG tablet    Sig: Take 1 tablet (500 mg total) by mouth every 6 (six) hours as needed for muscle spasms.    Dispense:  90 tablet    Refill:  0    Order Specific Question:   Supervising Provider    Answer:   MILLER, BRIAN [[id number]]   montelukast (SINGULAIR) 10 MG tablet    Sig: TAKE 1 TABLET BY MOUTH ONCE A DAY    Dispense:  90 tablet    Refill:  0    Order Specific Question:   Supervising Provider    Answer:   MILLER, BRIAN [[id number]]   pantoprazole (PROTONIX) 40 MG tablet    Sig: TAKE 1 TABLET BY MOUTH ONCE A DAY    Dispense:  90 tablet    Refill:  0    Order Specific Question:   Supervising Provider    Answer:   MILLER, BRIAN [[id number]]   Vitamin D, Ergocalciferol, (DRISDOL) 1.25 MG (50000 UNIT) CAPS  capsule    Sig: TAKE 1 CAPSULE BY MOUTH EVERY 7 DAYS    Dispense:  12 capsule    Refill:  0    Order Specific Question:   Supervising Provider    Answer:   Sabra Heck, [place]     *If you need refills on other medications prior to your next appointment, please contact your pharmacy*  Follow-Up: Call back or seek an in-person evaluation if the symptoms worsen or if the condition fails to improve as anticipated.  Other Instructions  - All medications refilled for 90 days once - Advised he will need to establish with a new PCP for long-term management and future refills - Discussed using link in AVS to schedule new patient appointment   If you have been instructed to have an in-person evaluation today at a local Urgent Care facility, please use the link below. It will take you to a list of all of our available [facility], including address, phone number and  hours of operation. Please do not delay care.  [facility]  If you or a family member do not have a primary care provider, use the link below to schedule a visit and establish care. When you choose a [facility] primary care physician or advanced practice provider, you gain a long-term partner in health. Find a Primary Care Provider  Learn more about [facility]'s in-office and virtual care options: [facility]

## 2021-11-10 ENCOUNTER — Other Ambulatory Visit (HOSPITAL_BASED_OUTPATIENT_CLINIC_OR_DEPARTMENT_OTHER): Payer: Self-pay

## 2022-02-06 ENCOUNTER — Telehealth: Payer: No Typology Code available for payment source | Admitting: Physician Assistant

## 2022-02-06 ENCOUNTER — Other Ambulatory Visit (HOSPITAL_BASED_OUTPATIENT_CLINIC_OR_DEPARTMENT_OTHER): Payer: Self-pay

## 2022-02-06 ENCOUNTER — Telehealth: Payer: Self-pay | Admitting: *Deleted

## 2022-02-06 DIAGNOSIS — I10 Essential (primary) hypertension: Secondary | ICD-10-CM | POA: Diagnosis not present

## 2022-02-06 MED ORDER — LOSARTAN POTASSIUM 100 MG PO TABS
100.0000 mg | ORAL_TABLET | Freq: Every day | ORAL | 0 refills | Status: DC
  Filled 2022-02-06: qty 14, 14d supply, fill #0

## 2022-02-06 NOTE — Telephone Encounter (Signed)
Pt called and made him aware needs to see UC or do another telehealth appt for medication renewal & appt r/s

## 2022-02-06 NOTE — Telephone Encounter (Signed)
Who Is Calling Patient / Member / Family / Caregiver Call Type Triage / Clinical Relationship To Patient Self Return Phone Number [phone] (Primary) Chief Complaint Prescription Refill or Medication Request (non symptomatic) Reason for Call Medication Question / Request Initial Comment Caller states is leaving on a trip and will run out of high blood pressure meds, needs refill asap. Also needs to reschedule on 02/07/2022 Additional Comment pt will be out of medication while traveling GOTO Facility Not Listed UC Translation No Nurse Assessment Nurse: [name], RN, [name] Date/Time (Eastern Time): 02/04/2022 1:54:48 PM Confirm and document reason for call. If symptomatic, describe symptoms. ---Caller states is leaving on a trip, ran out of Losartan yesterday. needs to reschedule appt that he has on 12/6, he will be out of town. Leaving on Tuesday to go out of town. no new or worsening sx  Final Disposition 02/04/2022 2:00:31 PM See PCP within 24 Hours Yes [name], RN, [name]  Comments User: [name], RN Date/Time [name] Time): 02/04/2022 2:03:00 PM has changed pcp since his previous pcp office closed. new patient appt in on 12/6, but states he will be out of town and needs to reschedule. upgraded to see pcp in 24 hr and referred to UC since out of med and doesn't know bp

## 2022-02-06 NOTE — Progress Notes (Signed)
Virtual Visit Consent   Ryan Holland, you are scheduled for a virtual visit with a [facility] provider today. Just as with appointments in the office, your consent must be obtained to participate. Your consent will be active for this visit and any virtual visit you may have with one of our providers in the next 365 days. If you have a MyChart account, a copy of this consent can be sent to you electronically.  As this is a virtual visit, video technology does not allow for your provider to perform a traditional examination. This may limit your provider's ability to fully assess your condition. If your provider identifies any concerns that need to be evaluated in person or the need to arrange testing (such as labs, EKG, etc.), we will make arrangements to do so. Although advances in technology are sophisticated, we cannot ensure that it will always work on either your end or our end. If the connection with a video visit is poor, the visit may have to be switched to a telephone visit. With either a video or telephone visit, we are not always able to ensure that we have a secure connection.  By engaging in this virtual visit, you consent to the provision of healthcare and authorize for your insurance to be billed (if applicable) for the services provided during this visit. Depending on your insurance coverage, you may receive a charge related to this service.  I need to obtain your verbal consent now. Are you willing to proceed with your visit today? Ryan Holland has provided verbal consent on 02/06/2022 for a virtual visit (video or telephone). Ryan Holland, [place]  Date: 02/06/2022 1:05 PM  Virtual Visit via Video Note   I, Ryan Holland, connected with  Ryan Holland  ([id number], [birth year]) on 02/06/22 at  1:00 PM EST by a video-enabled telemedicine application and verified that I am speaking with the correct person using two identifiers.  Location: Patient: Virtual Visit Location  Patient: Home Provider: Virtual Visit Location Provider: Home Office   I discussed the limitations of evaluation and management by telemedicine and the availability of in person appointments. The patient expressed understanding and agreed to proceed.    History of Present Illness: Ryan Holland is a 51 y.o. who identifies as a male who was assigned male at birth, and is being seen today for request for partial refill of his Losartan until he can see new PCP for NP/CPE on 12/18. Was originally scheduled later this week and had forgotten he had to go out of town for a few days when they originally made the appt for him. Called their today but they noted they could not send in refill until he had been seen. Was see by this department in September and given 90-day supply of his medications at that time until he could establish with a new pcp. Has been out of medication since Friday. Leaves tomorrow. Patient denies chest pain, palpitations, lightheadedness, dizziness, vision changes or frequent headaches.    HPI: HPI  Problems:  Patient Active Problem List   Diagnosis Date Noted   Hypogonadism in male 05/07/2020   Vitamin D deficiency 05/07/2020   Cough 08/25/2014   Acid reflux 08/25/2014   Allergic rhinitis 08/25/2014    Allergies:  Allergies  Allergen Reactions   Other     LEMON-sores in mouth   Medications:  Current Outpatient Medications:    cetirizine (ZYRTEC) 10 MG tablet, Take 10 mg by mouth 2 (two) times daily. , Disp: ,  Rfl:    citalopram (CELEXA) 40 MG tablet, Take 1 tablet by mouth daily., Disp: 90 tablet, Rfl: 0   COVID-19 At Home Antigen Test (CARESTART COVID-19 HOME TEST) KIT, Use as directed, Disp: 4 each, Rfl: 0   fluticasone (FLONASE) 50 MCG/ACT nasal spray, Place 2 sprays into both nostrils daily., Disp: , Rfl:    fluticasone furoate-vilanterol (BREO ELLIPTA) 200-25 MCG/ACT AEPB, Inhale 1 puff into the lungs daily., Disp: 60 each, Rfl: 1   losartan (COZAAR) 100 MG  tablet, TAKE 1 TABLET BY MOUTH ONCE A DAY, Disp: 90 tablet, Rfl: 0   methocarbamol (ROBAXIN) 500 MG tablet, Take 1 tablet (500 mg total) by mouth every 6 (six) hours as needed for muscle spasms., Disp: 90 tablet, Rfl: 0   montelukast (SINGULAIR) 10 MG tablet, Take 1 tablet (10 mg total) by mouth daily., Disp: 90 tablet, Rfl: 0   pantoprazole (PROTONIX) 40 MG tablet, Take 1 tablet (40 mg total) by mouth daily., Disp: 90 tablet, Rfl: 0   testosterone cypionate (DEPOTESTOSTERONE CYPIONATE) 200 MG/ML injection, INJECT 1 ML (200 MG TOTAL) INTO THE MUSCLE EVERY 14 (FOURTEEN) DAYS., Disp: 6 mL, Rfl: 1   Vitamin D, Ergocalciferol, (DRISDOL) 1.25 MG (50000 UNIT) CAPS capsule, TAKE 1 CAPSULE BY MOUTH EVERY 7 DAYS, Disp: 12 capsule, Rfl: 0  Observations/Objective: Patient is well-developed, well-nourished in no acute distress.  Resting comfortably at home.  Head is normocephalic, atraumatic.  No labored breathing. Speech is clear and coherent with logical content.  Patient is alert and oriented at baseline.   Assessment and Plan: 1. Essential hypertension  Out of medication. Cannot see new PCP until 12/18. Will give a final 2 week supply of medication. He is aware that no further refill will come from our department and that if he cannot/doesn't see his new PCP on time, then he will have to go for in person UC evaluation in order to get exam, updated labs and vitals and any further refills.   Follow Up Instructions: I discussed the assessment and treatment plan with the patient. The patient was provided an opportunity to ask questions and all were answered. The patient agreed with the plan and demonstrated an understanding of the instructions.  A copy of instructions were sent to the patient via MyChart unless otherwise noted below.   The patient was advised to call back or seek an in-person evaluation if the symptoms worsen or if the condition fails to improve as anticipated.  Time:  I spent 10  minutes with the patient via telehealth technology discussing the above problems/concerns.    Ryan Rio, [name]

## 2022-02-06 NOTE — Patient Instructions (Signed)
Ryan Holland, thank you for joining [name], [name] for today's virtual visit.  While this provider is not your primary care provider (PCP), if your PCP is located in our provider database this encounter information will be shared with them immediately following your visit.   [place] account gives you access to today's visit and all your visits, tests, and labs performed at [facility] " click here if you don't have a [place] account or go to [email or web address]  Consent: (Patient) Ryan Holland provided verbal consent for this virtual visit at the beginning of the encounter.  Current Medications:  Current Outpatient Medications:    cetirizine (ZYRTEC) 10 MG tablet, Take 10 mg by mouth 2 (two) times daily. , Disp: , Rfl:    citalopram (CELEXA) 40 MG tablet, Take 1 tablet by mouth daily., Disp: 90 tablet, Rfl: 0   COVID-19 At Home Antigen Test (CARESTART COVID-19 HOME TEST) KIT, Use as directed, Disp: 4 each, Rfl: 0   fluticasone (FLONASE) 50 MCG/ACT nasal spray, Place 2 sprays into both nostrils daily., Disp: , Rfl:    fluticasone furoate-vilanterol (BREO ELLIPTA) 200-25 MCG/ACT AEPB, Inhale 1 puff into the lungs daily., Disp: 60 each, Rfl: 1   losartan (COZAAR) 100 MG tablet, TAKE 1 TABLET BY MOUTH ONCE A DAY, Disp: 14 tablet, Rfl: 0   methocarbamol (ROBAXIN) 500 MG tablet, Take 1 tablet (500 mg total) by mouth every 6 (six) hours as needed for muscle spasms., Disp: 90 tablet, Rfl: 0   montelukast (SINGULAIR) 10 MG tablet, Take 1 tablet (10 mg total) by mouth daily., Disp: 90 tablet, Rfl: 0   pantoprazole (PROTONIX) 40 MG tablet, Take 1 tablet (40 mg total) by mouth daily., Disp: 90 tablet, Rfl: 0   testosterone cypionate (DEPOTESTOSTERONE CYPIONATE) 200 MG/ML injection, INJECT 1 ML (200 MG TOTAL) INTO THE MUSCLE EVERY 14 (FOURTEEN) DAYS., Disp: 6 mL, Rfl: 1   Vitamin D, Ergocalciferol, (DRISDOL) 1.25 MG (50000 UNIT) CAPS capsule, TAKE  1 CAPSULE BY MOUTH EVERY 7 DAYS, Disp: 12 capsule, Rfl: 0   Medications ordered in this encounter:  Meds ordered this encounter  Medications   losartan (COZAAR) 100 MG tablet    Sig: TAKE 1 TABLET BY MOUTH ONCE A DAY    Dispense:  14 tablet    Refill:  0    One time partial fill until PCP appt 12/18. No further refills to come from our [place].    Order Specific Question:   Supervising Provider    Answer:   Chase Picket [[id number]]     *If you need refills on other medications prior to your next appointment, please contact your pharmacy*  Follow-Up: Call back or seek an in-person evaluation if the symptoms worsen or if the condition fails to improve as anticipated.  [place] [phone]  Other Instructions One time partial refill has been sent.  Take as directed.  Check BP at home daily and record. Take to follow-up with new PCP.  DASH Eating Plan DASH stands for Dietary Approaches to Stop Hypertension. The DASH eating plan is a healthy eating plan that has been shown to: Reduce high blood pressure (hypertension). Reduce your risk for type 2 diabetes, heart disease, and stroke. Help with weight loss. What are tips for following this plan? Reading food labels Check food labels for the amount of salt (sodium) per serving. Choose foods with less than 5 percent of the Daily Value of sodium. Generally,  foods with less than 300 milligrams (mg) of sodium per serving fit into this eating plan. To find whole grains, look for the word "whole" as the first word in the ingredient list. Shopping Buy products labeled as "low-sodium" or "no salt added." Buy fresh foods. Avoid canned foods and pre-made or frozen meals. Cooking Avoid adding salt when cooking. Use salt-free seasonings or herbs instead of table salt or sea salt. Check with your health care provider or pharmacist before using salt substitutes. Do not fry foods. Cook foods using healthy methods  such as baking, boiling, grilling, roasting, and broiling instead. Cook with heart-healthy oils, such as olive, canola, avocado, soybean, or sunflower oil. Meal planning  Eat a balanced diet that includes: 4 or more servings of fruits and 4 or more servings of vegetables each day. Try to fill one-half of your plate with fruits and vegetables. 6-8 servings of whole grains each day. Less than 6 oz (170 g) of lean meat, poultry, or fish each day. A 3-oz (85-g) serving of meat is about the same size as a deck of cards. One egg equals 1 oz (28 g). 2-3 servings of low-fat dairy each day. One serving is 1 cup (237 mL). 1 serving of nuts, seeds, or beans 5 times each week. 2-3 servings of heart-healthy fats. Healthy fats called omega-3 fatty acids are found in foods such as walnuts, flaxseeds, fortified milks, and eggs. These fats are also found in cold-water fish, such as sardines, salmon, and mackerel. Limit how much you eat of: Canned or prepackaged foods. Food that is high in trans fat, such as some fried foods. Food that is high in saturated fat, such as fatty meat. Desserts and other sweets, sugary drinks, and other foods with added sugar. Full-fat dairy products. Do not salt foods before eating. Do not eat more than 4 egg yolks a week. Try to eat at least 2 vegetarian meals a week. Eat more home-cooked food and less restaurant, buffet, and fast food. Lifestyle When eating at a restaurant, ask that your food be prepared with less salt or no salt, if possible. If you drink alcohol: Limit how much you use to: 0-1 drink a day for women who are not pregnant. 0-2 drinks a day for men. Be aware of how much alcohol is in your drink. In the [place] one drink equals one 12 oz bottle of beer (355 mL), one 5 oz glass of wine (148 mL), or one 1 oz glass of hard liquor (44 mL). General information Avoid eating more than 2,300 mg of salt a day. If you have hypertension, you may need to reduce your sodium  intake to 1,500 mg a day. Work with your health care provider to maintain a healthy body weight or to lose weight. Ask what an ideal weight is for you. Get at least 30 minutes of exercise that causes your heart to beat faster (aerobic exercise) most days of the week. Activities may include walking, swimming, or biking. Work with your health care provider or dietitian to adjust your eating plan to your individual calorie needs. What foods should I eat? Fruits All fresh, dried, or frozen fruit. Canned fruit in natural juice (without added sugar). Vegetables Fresh or frozen vegetables (raw, steamed, roasted, or grilled). Low-sodium or reduced-sodium tomato and vegetable juice. Low-sodium or reduced-sodium tomato sauce and tomato paste. Low-sodium or reduced-sodium canned vegetables. Grains Whole-grain or whole-wheat bread. Whole-grain or whole-wheat pasta. Brown rice. [place]. Bulgur. Whole-grain and low-sodium cereals. Pita bread.  Low-fat, low-sodium crackers. Whole-wheat flour tortillas. Meats and other proteins Skinless chicken or [place]. Ground chicken or [place]. Pork with fat trimmed off. Fish and seafood. Egg whites. Dried beans, peas, or lentils. Unsalted nuts, nut butters, and seeds. Unsalted canned beans. Lean cuts of beef with fat trimmed off. Low-sodium, lean precooked or cured meat, such as sausages or meat loaves. Dairy Low-fat (1%) or fat-free (skim) milk. Reduced-fat, low-fat, or fat-free cheeses. Nonfat, low-sodium ricotta or cottage cheese. Low-fat or nonfat yogurt. Low-fat, low-sodium cheese. Fats and oils Soft margarine without trans fats. Vegetable oil. Reduced-fat, low-fat, or light mayonnaise and salad dressings (reduced-sodium). Canola, safflower, olive, avocado, soybean, and sunflower oils. Avocado. Seasonings and condiments Herbs. Spices. Seasoning mixes without salt. Other foods Unsalted popcorn and pretzels. Fat-free sweets. The items listed above may not be a  complete list of foods and beverages you can eat. Contact a dietitian for more information. What foods should I avoid? Fruits Canned fruit in a light or heavy syrup. Fried fruit. Fruit in cream or butter sauce. Vegetables Creamed or fried vegetables. Vegetables in a cheese sauce. Regular canned vegetables (not low-sodium or reduced-sodium). Regular canned tomato sauce and paste (not low-sodium or reduced-sodium). Regular tomato and vegetable juice (not low-sodium or reduced-sodium). Angie Fava. Olives. Grains Baked goods made with fat, such as croissants, muffins, or some breads. Dry pasta or rice meal packs. Meats and other proteins Fatty cuts of meat. Ribs. Fried meat. Berniece Salines. Bologna, salami, and other precooked or cured meats, such as sausages or meat loaves. Fat from the back of a pig (fatback). Bratwurst. Salted nuts and seeds. Canned beans with added salt. Canned or smoked fish. Whole eggs or egg yolks. Chicken or [place] with skin. Dairy Whole or 2% milk, cream, and half-and-half. Whole or full-fat cream cheese. Whole-fat or sweetened yogurt. Full-fat cheese. Nondairy creamers. Whipped toppings. Processed cheese and cheese spreads. Fats and oils Butter. Stick margarine. Lard. Shortening. Ghee. Bacon fat. Tropical oils, such as coconut, palm kernel, or palm oil. Seasonings and condiments Onion salt, garlic salt, seasoned salt, table salt, and sea salt. Worcestershire sauce. Tartar sauce. Barbecue sauce. Teriyaki sauce. Soy sauce, including reduced-sodium. Steak sauce. Canned and packaged gravies. Fish sauce. Oyster sauce. Cocktail sauce. Store-bought horseradish. Ketchup. Mustard. Meat flavorings and tenderizers. Bouillon cubes. Hot sauces. Pre-made or packaged marinades. Pre-made or packaged taco seasonings. Relishes. Regular salad dressings. Other foods Salted popcorn and pretzels. The items listed above may not be a complete list of foods and beverages you should avoid. Contact a dietitian for  more information. Where to find more information National Heart, Lung, and Blood Institute: https://wilson-eaton.com/ American Heart Association: www.heart.org Academy of Nutrition and Dietetics: www.eatright.[place]: www.kidney.org Summary The DASH eating plan is a healthy eating plan that has been shown to reduce high blood pressure (hypertension). It may also reduce your risk for type 2 diabetes, heart disease, and stroke. When on the DASH eating plan, aim to eat more fresh fruits and vegetables, whole grains, lean proteins, low-fat dairy, and heart-healthy fats. With the DASH eating plan, you should limit salt (sodium) intake to 2,300 mg a day. If you have hypertension, you may need to reduce your sodium intake to 1,500 mg a day. Work with your health care provider or dietitian to adjust your eating plan to your individual calorie needs. This information is not intended to replace advice given to you by your health care provider. Make sure you discuss any questions you have with your health care provider. Document Revised:  01/24/2019 Document Reviewed: 01/24/2019 Elsevier Patient Education  [place].    If you have been instructed to have an in-person evaluation today at a local Urgent Care facility, please use the link below. It will take you to a list of all of our available [facility], including address, phone number and hours of operation. Please do not delay care.  [facility]  If you or a family member do not have a primary care provider, use the link below to schedule a visit and establish care. When you choose a [place] primary care physician or advanced practice provider, you gain a long-term partner in health. Find a Primary Care Provider  Learn more about [facility] in-office and virtual care options: [facility]

## 2022-02-08 ENCOUNTER — Ambulatory Visit: Payer: No Typology Code available for payment source | Admitting: Medical

## 2022-02-20 ENCOUNTER — Ambulatory Visit (INDEPENDENT_AMBULATORY_CARE_PROVIDER_SITE_OTHER): Payer: No Typology Code available for payment source | Admitting: Medical

## 2022-02-20 ENCOUNTER — Other Ambulatory Visit (HOSPITAL_BASED_OUTPATIENT_CLINIC_OR_DEPARTMENT_OTHER): Payer: Self-pay

## 2022-02-20 VITALS — BP 116/53 | HR 86 | Temp 98.2°F | Resp 18 | Ht 70.0 in | Wt 303.6 lb

## 2022-02-20 DIAGNOSIS — E291 Testicular hypofunction: Secondary | ICD-10-CM | POA: Diagnosis not present

## 2022-02-20 DIAGNOSIS — E669 Obesity, unspecified: Secondary | ICD-10-CM

## 2022-02-20 DIAGNOSIS — K219 Gastro-esophageal reflux disease without esophagitis: Secondary | ICD-10-CM

## 2022-02-20 DIAGNOSIS — E559 Vitamin D deficiency, unspecified: Secondary | ICD-10-CM

## 2022-02-20 DIAGNOSIS — Z23 Encounter for immunization: Secondary | ICD-10-CM

## 2022-02-20 DIAGNOSIS — F419 Anxiety disorder, unspecified: Secondary | ICD-10-CM | POA: Diagnosis not present

## 2022-02-20 DIAGNOSIS — I10 Essential (primary) hypertension: Secondary | ICD-10-CM

## 2022-02-20 DIAGNOSIS — F339 Major depressive disorder, recurrent, unspecified: Secondary | ICD-10-CM

## 2022-02-20 DIAGNOSIS — R748 Abnormal levels of other serum enzymes: Secondary | ICD-10-CM

## 2022-02-20 DIAGNOSIS — M5442 Lumbago with sciatica, left side: Secondary | ICD-10-CM

## 2022-02-20 DIAGNOSIS — G8929 Other chronic pain: Secondary | ICD-10-CM

## 2022-02-20 DIAGNOSIS — Z125 Encounter for screening for malignant neoplasm of prostate: Secondary | ICD-10-CM | POA: Diagnosis not present

## 2022-02-20 DIAGNOSIS — R7989 Other specified abnormal findings of blood chemistry: Secondary | ICD-10-CM

## 2022-02-20 DIAGNOSIS — J309 Allergic rhinitis, unspecified: Secondary | ICD-10-CM

## 2022-02-20 LAB — COMPREHENSIVE METABOLIC PANEL
ALT: 82 U/L — ABNORMAL HIGH (ref 0–53)
AST: 75 U/L — ABNORMAL HIGH (ref 0–37)
Albumin: 4.1 g/dL (ref 3.5–5.2)
Alkaline Phosphatase: 83 U/L (ref 39–117)
BUN: 16 mg/dL (ref 6–23)
CO2: 24 mEq/L (ref 19–32)
Calcium: 9.3 mg/dL (ref 8.4–10.5)
Chloride: 101 mEq/L (ref 96–112)
Creatinine, Ser: 0.82 mg/dL (ref 0.40–1.50)
GFR: 102.92 mL/min (ref >60.00)
Glucose, Bld: 123 mg/dL — ABNORMAL HIGH (ref 70–99)
Potassium: 3.9 mEq/L (ref 3.5–5.1)
Sodium: 136 mEq/L (ref 135–145)
Total Bilirubin: 1.4 mg/dL — ABNORMAL HIGH (ref 0.2–1.2)
Total Protein: 7 g/dL (ref 6.0–8.3)

## 2022-02-20 LAB — LIPID PANEL
Cholesterol: 237 mg/dL — ABNORMAL HIGH (ref 0–200)
HDL: 62 mg/dL (ref >39.00)
NonHDL: 175.48
Total CHOL/HDL Ratio: 4
Triglycerides: 212 mg/dL — ABNORMAL HIGH (ref 0.0–149.0)
VLDL: 42.4 mg/dL — ABNORMAL HIGH (ref 0.0–40.0)

## 2022-02-20 LAB — VITAMIN D 25 HYDROXY (VIT D DEFICIENCY, FRACTURES): VITD: 24.8 ng/mL — ABNORMAL LOW (ref 30.00–100.00)

## 2022-02-20 LAB — LDL CHOLESTEROL, DIRECT: Direct LDL: 129 mg/dL

## 2022-02-20 LAB — PSA: PSA: 0.51 ng/mL (ref 0.10–4.00)

## 2022-02-20 MED ORDER — MONTELUKAST SODIUM 10 MG PO TABS
10.0000 mg | ORAL_TABLET | Freq: Every day | ORAL | 3 refills | Status: DC
  Filled 2022-02-20: qty 90, 90d supply, fill #0
  Filled 2022-06-09: qty 90, 90d supply, fill #1
  Filled 2022-09-27: qty 90, 90d supply, fill #2
  Filled 2022-12-21: qty 90, 90d supply, fill #3

## 2022-02-20 MED ORDER — VITAMIN D (ERGOCALCIFEROL) 1.25 MG (50000 UNIT) PO CAPS
50000.0000 [IU] | ORAL_CAPSULE | ORAL | 0 refills | Status: DC
  Filled 2022-02-20 – 2022-03-09 (×2): qty 8, 56d supply, fill #0

## 2022-02-20 MED ORDER — CITALOPRAM HYDROBROMIDE 40 MG PO TABS
40.0000 mg | ORAL_TABLET | Freq: Every day | ORAL | 1 refills | Status: DC
  Filled 2022-02-20: qty 90, 90d supply, fill #0
  Filled 2022-06-09: qty 90, 90d supply, fill #1

## 2022-02-20 MED ORDER — PANTOPRAZOLE SODIUM 40 MG PO TBEC
40.0000 mg | DELAYED_RELEASE_TABLET | Freq: Every day | ORAL | 3 refills | Status: DC
  Filled 2022-02-20: qty 90, 90d supply, fill #0
  Filled 2022-06-09: qty 90, 90d supply, fill #1
  Filled 2022-09-27: qty 90, 90d supply, fill #2
  Filled 2022-12-21: qty 90, 90d supply, fill #3

## 2022-02-20 MED ORDER — LOSARTAN POTASSIUM 100 MG PO TABS
100.0000 mg | ORAL_TABLET | Freq: Every day | ORAL | 1 refills | Status: DC
  Filled 2022-02-20: qty 90, 90d supply, fill #0
  Filled 2022-06-09: qty 90, 90d supply, fill #1

## 2022-02-20 MED ORDER — METHOCARBAMOL 500 MG PO TABS
500.0000 mg | ORAL_TABLET | Freq: Four times a day (QID) | ORAL | 0 refills | Status: DC | PRN
  Filled 2022-02-20: qty 90, 23d supply, fill #0

## 2022-02-20 NOTE — Progress Notes (Signed)
Subjective:    Patient ID: Ryan Holland, male    DOB: [birth year], 51 y.o.   MRN: [id number]  HPI Pt in for first time.  Pt manages [employer]. Does not exercise regularly. Moderate healthy diet. Stopped eating fried foods 2 years. Nonsmoker. Drinks 2 liters vodka.   Admits drinking heavily for years.   Htn- bp controlled.- losartan 100 mg daily,   Gerd- on protonix 40 mg daily.  Seasonal allergies spring and fall. On montelukast presently and controlled. Saw allergies in past did get immunotherapy.  Anxiety- controlled with celexa.  Sleep apnea- has cpap machine.   Obese pt- previously was on wegovy.    Review of Systems  Constitutional:  Negative for chills, fatigue and fever.  Respiratory:  Negative for cough, chest tightness and wheezing.   Cardiovascular:  Negative for chest pain and palpitations.  Gastrointestinal:  Negative for abdominal pain and blood in stool.  Genitourinary:  Negative for dysuria.  Neurological:  Negative for dizziness and headaches.  Hematological:  Negative for adenopathy. Does not bruise/bleed easily.  Psychiatric/Behavioral:  Negative for behavioral problems, decreased concentration, dysphoric mood and sleep disturbance. The patient is not nervous/anxious.        Controlled presently.    Past Medical History:  Diagnosis Date   Anxiety    GERD (gastroesophageal reflux disease)    OCCAS-WOULD USE OTC MED IF NEEDED   HNP (herniated nucleus pulposus), lumbar    PAIN IN LOWER BACK AND PAIN DOWN BOTH LEGS -12-24-14 radiates down right leg presently.   Hypertension    Sleep apnea    HAS CPAP-DOES NOT USE-DOES NOT KNOW SETTINGS     Social History   Socioeconomic History   Marital status: Married    Spouse name: Not on file   Number of children: Not on file   Years of education: Not on file   Highest education level: Not on file  Occupational History   Not on file  Tobacco Use   Smoking status: Former    Packs/day:  1.00    Years: 20.00    Total pack years: 20.00    Types: Cigarettes    Quit date: 10/05/2011    Years since quitting: 10.3   Smokeless tobacco: Former    Types: Chew  Substance and Sexual Activity   Alcohol use: Yes    Alcohol/week: 0.0 standard drinks of alcohol    Comment: 2 MIXED DRINKS (VODKA) DAILY-usually    Drug use: No   Sexual activity: Not on file  Other Topics Concern   Not on file  Social History Narrative   Not on file   Social Determinants of Health   Financial Resource Strain: Not on file  Food Insecurity: Not on file  Transportation Needs: Not on file  Physical Activity: Not on file  Stress: Not on file  Social Connections: Not on file  Intimate Partner Violence: Not on file    Past Surgical History:  Procedure Laterality Date   01/05/2011 LUMBAR DECOMPRESSION AND MICRODISCECTOMY     CERVICAL FUSION  C4-5      ESOPHAGOGASTRODUODENOSCOPY (EGD) WITH PROPOFOL N/A 12/30/2014   Procedure: ESOPHAGOGASTRODUODENOSCOPY (EGD) WITH PROPOFOL;  Surgeon: Arta Silence, MD;  Location: [facility];  Service: Endoscopy;  Laterality: N/A;  With Bravo   LUMBAR LAMINECTOMY/DECOMPRESSION MICRODISCECTOMY  07/26/2011   Procedure: LUMBAR LAMINECTOMY/DECOMPRESSION MICRODISCECTOMY;  Surgeon: Johnn Hai, MD;  Location: [facility];  Service: Orthopedics;  Laterality: Right;  Re-Do Lumbar Decompression L5-S1 on Right,  PILONIDAL CYST EXCISION      Family History  Problem Relation Age of Onset   COPD Father    Congestive Heart Failure Father    Breast cancer Mother     Allergies  Allergen Reactions   Other     LEMON-sores in mouth    Current Outpatient Medications on File Prior to Visit  Medication Sig Dispense Refill   cetirizine (ZYRTEC) 10 MG tablet Take 10 mg by mouth 2 (two) times daily.      COVID-19 At Home Antigen Test [facility] COVID-19 HOME TEST) KIT Use as directed 4 each 0   fluticasone (FLONASE) 50 MCG/ACT nasal spray Place 2 sprays into both nostrils  daily.     fluticasone furoate-vilanterol (BREO ELLIPTA) 200-25 MCG/ACT AEPB Inhale 1 puff into the lungs daily. 60 each 1   testosterone cypionate (DEPOTESTOSTERONE CYPIONATE) 200 MG/ML injection INJECT 1 ML (200 MG TOTAL) INTO THE MUSCLE EVERY 14 (FOURTEEN) DAYS. 6 mL 1   Vitamin D, Ergocalciferol, (DRISDOL) 1.25 MG (50000 UNIT) CAPS capsule TAKE 1 CAPSULE BY MOUTH EVERY 7 DAYS 12 capsule 0   No current facility-administered medications on file prior to visit.    BP (!) 116/53   Pulse 86   Temp 98.2 F (36.8 C)   Resp 18   Ht _0  (1.778 m)   Wt (!) 303 lb 9.6 oz (137.7 kg)   SpO2 97%   BMI 43.56 kg/m        Objective:   Physical Exam  General Mental Status- Alert. General Appearance- Not in acute distress.   Skin General: Color- Normal Color. Moisture- Normal Moisture.  Neck Carotid Arteries- Normal color. Moisture- Normal Moisture. No carotid bruits. No JVD.  Chest and Lung Exam Auscultation: Breath Sounds:-Normal.  Cardiovascular Auscultation:Rythm- Regular. Murmurs & Other Heart Sounds:Auscultation of the heart reveals- No Murmurs.  Abdomen Inspection:-Inspeection Normal. Palpation/Percussion:Note:No mass. Palpation and Percussion of the abdomen reveal- Non Tender, Non Distended + BS, no rebound or guarding.    Neurologic Cranial Nerve exam:- CN III-XII intact(No nystagmus), symmetric smile. Strength:- 5/5 equal and symmetric strength both upper and lower extremities.       Assessment & Plan:   Patient Instructions  1. Essential hypertension Well-controlled.  Continue losartan - Comp Met (CMET) - Lipid panel  2. Hypogonadism in male Will check levels today.  Also get screening PSA. - Testosterone Total,Free,Bio, Males-([facility])  3. Anxiety Well-controlled.  Continue Celexa.  4. Gastroesophageal reflux disease, unspecified whether esophagitis present Well-controlled on Protonix.    5. Allergic rhinitis, unspecified seasonality, unspecified  trigger Well-controlled on montelukast.  6 . Screening for prostate cancer  - PSA  7. Obesity, unspecified classification, unspecified obesity type, unspecified whether serious comorbidity present Formally used [id number] with success.  On discussion decided to go ahead and refer to weight management/weight loss clinic.  8. Vitamin D deficiency - VITAMIN D 25 Hydroxy (Vit-D Deficiency, Fractures)   Follow-up date to be determined after lab review.   Mackie Pai, [name]

## 2022-02-20 NOTE — Addendum Note (Signed)
Addended by: Maximino Sarin on: 02/20/2022 10:10 AM   Modules accepted: Orders

## 2022-02-20 NOTE — Patient Instructions (Addendum)
1. Essential hypertension Well-controlled.  Continue losartan - Comp Met (CMET) - Lipid panel  2. Hypogonadism in male Will check levels today.  Also get screening PSA. - Testosterone Total,Free,Bio, Males-([facility])  3. Anxiety Well-controlled.  Continue Celexa.  4. Gastroesophageal reflux disease, unspecified whether esophagitis present Well-controlled on Protonix.    5. Allergic rhinitis, unspecified seasonality, unspecified trigger Well-controlled on montelukast.  6 . Screening for prostate cancer  - PSA  7. Obesity, unspecified classification, unspecified obesity type, unspecified whether serious comorbidity present Formally used [id number] with success.  On discussion decided to go ahead and refer to weight management/weight loss clinic.  8. Vitamin D deficiency - VITAMIN D 25 Hydroxy (Vit-D Deficiency, Fractures)   Follow-up date to be determined after lab review.

## 2022-02-20 NOTE — Addendum Note (Signed)
Addended by: Gwenevere Abbot on: 02/20/2022 08:53 PM   Modules accepted: Orders

## 2022-02-21 ENCOUNTER — Other Ambulatory Visit (HOSPITAL_BASED_OUTPATIENT_CLINIC_OR_DEPARTMENT_OTHER): Payer: Self-pay

## 2022-02-21 LAB — TESTOSTERONE TOTAL,FREE,BIO, MALES
Albumin: 4.1 g/dL (ref 3.6–5.1)
Sex Hormone Binding: 43 nmol/L (ref 10–50)
Testosterone: 217 ng/dL — ABNORMAL LOW (ref 250–827)

## 2022-02-23 ENCOUNTER — Encounter: Payer: Self-pay | Admitting: Medical

## 2022-02-27 NOTE — Addendum Note (Signed)
Addended by: Gwenevere Abbot on: 02/27/2022 08:27 AM   Modules accepted: Orders

## 2022-02-28 ENCOUNTER — Other Ambulatory Visit (HOSPITAL_BASED_OUTPATIENT_CLINIC_OR_DEPARTMENT_OTHER): Payer: Self-pay

## 2022-02-28 ENCOUNTER — Ambulatory Visit (HOSPITAL_BASED_OUTPATIENT_CLINIC_OR_DEPARTMENT_OTHER)
Admission: RE | Admit: 2022-02-28 | Discharge: 2022-02-28 | Disposition: A | Discharge status: Home / Self Care | Payer: No Typology Code available for payment source | Source: Ambulatory Visit | Attending: Medical | Admitting: Medical

## 2022-02-28 DIAGNOSIS — R748 Abnormal levels of other serum enzymes: Secondary | ICD-10-CM | POA: Insufficient documentation

## 2022-03-09 ENCOUNTER — Other Ambulatory Visit (HOSPITAL_BASED_OUTPATIENT_CLINIC_OR_DEPARTMENT_OTHER): Payer: Self-pay

## 2022-03-16 ENCOUNTER — Ambulatory Visit (INDEPENDENT_AMBULATORY_CARE_PROVIDER_SITE_OTHER): Payer: 59 | Admitting: Internal Medicine

## 2022-03-16 ENCOUNTER — Encounter (INDEPENDENT_AMBULATORY_CARE_PROVIDER_SITE_OTHER): Payer: Self-pay | Admitting: Internal Medicine

## 2022-03-16 VITALS — BP 142/88 | HR 95 | Temp 98.0°F | Ht 70.0 in | Wt 298.0 lb

## 2022-03-16 DIAGNOSIS — Z6841 Body Mass Index (BMI) 40.0 and over, adult: Secondary | ICD-10-CM | POA: Diagnosis not present

## 2022-03-16 DIAGNOSIS — I10 Essential (primary) hypertension: Secondary | ICD-10-CM | POA: Diagnosis not present

## 2022-03-16 DIAGNOSIS — K760 Fatty (change of) liver, not elsewhere classified: Secondary | ICD-10-CM

## 2022-03-16 DIAGNOSIS — E291 Testicular hypofunction: Secondary | ICD-10-CM

## 2022-03-16 NOTE — Progress Notes (Signed)
Office: [phone]  /  Fax: [phone]   Initial Visit  Ryan Holland was seen in clinic today to evaluate for obesity. He is interested in losing weight to improve overall health and reduce the risk of weight related complications. He presents today to review program treatment options, initial physical assessment, and evaluation.   Patient has been on Wegovy through patient's assistance program for about a year.  Had lost approximately 75 pounds.  Unfortunately he experienced weight regain after discontinuing medication.    He was referred by: Self-Referral  When asked what else they would like to accomplish? He states: Adopt healthier eating patterns, Improve existing medical conditions, and Improve quality of life  When asked how has your weight affected you? He states: Contributed to medical problems, Contributed to orthopedic problems or mobility issues, Having fatigue, Having poor endurance, and Problems with eating patterns  Some associated conditions: Hypertension, Fatty liver disease, OSA, Prediabetes, and Vitamin D Deficiency  Contributing factors: Family history, Disruption of circadian rhythm, Nutritional, Reduced physical activity, and Eating patterns  Weight promoting medications identified: None  Current nutrition plan: None  Current level of physical activity: None  Current or previous pharmacotherapy: GLP-1  Response to medication: Lost weight initially but was unable to sustain weight loss   Past medical history includes:   Past Medical History:  Diagnosis Date   Anxiety    GERD (gastroesophageal reflux disease)    OCCAS-WOULD USE OTC MED IF NEEDED   HNP (herniated nucleus pulposus), lumbar    PAIN IN LOWER BACK AND PAIN DOWN BOTH LEGS -12-24-14 radiates down right leg presently.   Hypertension    Sleep apnea    HAS CPAP-DOES NOT USE-DOES NOT KNOW SETTINGS     Objective:   BP (!) 142/88   Pulse 95   Temp 98 F (36.7 C)   Ht 5\' 10"  (1.778 m)    Wt 298 lb (135.2 kg)   SpO2 96%   BMI 42.76 kg/m  He was weighed on the bioimpedance scale: Body mass index is 42.76 kg/m.  Peak Weight: 303, Body Fat%: 39, Visceral Fat Rating: 24, Weight trend over the last 12 months: Increasing  General:  Alert, oriented and cooperative. Patient is in no acute distress.  Respiratory: Normal respiratory effort, no problems with respiration noted  Extremities: Normal range of motion.    Mental Status: Normal mood and affect. Normal behavior. Normal judgment and thought content.   Assessment and Plan:  1. Class 3 severe obesity with serious comorbidity and body mass index (BMI) of 40.0 to 44.9 in adult, unspecified obesity type (Ryan Holland) We reviewed weight, biometrics, associated medical conditions and contributing factors with patient. She would benefit from weight loss therapy via a modified calorie, low-carb, high-protein nutritional plan tailored to their REE (resting energy expenditure) which will be determined by indirect calorimetry.  We will also assess for cardiometabolic risk and nutritional derangements via fasting serologies at her next appointment.  Patient had been on [facility] for about 12 months with good response but had weight regain after discontinuation of medication.  2. Essential hypertension Blood pressure above target.  He is on losartan 100 mg a day.  I reviewed recent renal parameters and they are within normal limits.  Patient will benefit from monitoring at home and intensification of therapy if blood pressure above target of 120/80.  We will review blood pressure monitoring and results at his intake appointment.  3. Hypogonadism in male Secondary to obesity.  Currently on testosterone replacement.  This may improve with weight loss of 10 to 15%.  We will review labs to make sure he has had a PSA and also CBC.  He has obstructive sleep apnea and testosterone replacement may worsen that and increase the risk of nighttime hypoxemia.  4.  NAFLD (nonalcoholic fatty liver disease) His most recent labs showed an AST of 75 ALT of 82.  He also has an elevated bilirubin at 1.4 undifferentiated.  Patient acknowledges drinking vodka daily.  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  Patient counseled that the combination of alcohol and fatty liver infiltration increases risk of developing hepatic cirrhosis.  His bilirubin was elevated and so I recommend checking a PT and INR and possible FibroTest to make sure he does not have liver impairment and fibrosis.  Losing 10 to 15% of body weight may improve condition.  Also using GLP-1 drug has been proven to be beneficial.       Obesity Treatment / Action Plan:  Patient will work on garnering support from family and friends to begin weight loss journey. Will work on eliminating or reducing the presence of highly palatable, calorie dense foods in the home. Will complete provided nutritional and psychosocial assessment questionnaire before the next appointment. Will be scheduled for indirect calorimetry to determine resting energy expenditure in a fasting state.  This will allow [place] to create a reduced calorie, high-protein meal plan to promote loss of fat mass while preserving muscle mass. Will avoid skipping meals which may result in increased hunger signals and overeating at certain times. Counseled on the health benefits of losing 5%-15% of total body weight. Was counseled on nutritional approaches to weight loss and benefits of complex carbs and high quality protein as part of nutritional weight management. Was counseled on pharmacotherapy and role as an adjunct in weight management.   Obesity Education Performed Today:  He was weighed on the bioimpedance scale and results were discussed and documented in the synopsis.  We discussed obesity as a disease and the importance of a more detailed evaluation of all the factors contributing  to the disease.  We discussed the importance of long term lifestyle changes which include nutrition, exercise and behavioral modifications as well as the importance of customizing this to his specific health and social needs.  We discussed the benefits of reaching a healthier weight to alleviate the symptoms of existing conditions and reduce the risks of the biomechanical, metabolic and psychological effects of obesity.  Ryan Holland appears to be in the action stage of change and states they are ready to start intensive lifestyle modifications and behavioral modifications.  35 minutes was spent today on this visit including the above counseling, pre-visit chart review, and post-visit documentation.  Reviewed by clinician on day of visit: allergies, medications, problem list, medical history, surgical history, family history, social history, and previous encounter notes.    I have reviewed the above documentation for accuracy and completeness, and I agree with the above.  Thomes Dinning, MD

## 2022-04-03 ENCOUNTER — Ambulatory Visit (INDEPENDENT_AMBULATORY_CARE_PROVIDER_SITE_OTHER): Payer: 59 | Admitting: Medical

## 2022-04-03 ENCOUNTER — Other Ambulatory Visit (HOSPITAL_BASED_OUTPATIENT_CLINIC_OR_DEPARTMENT_OTHER): Payer: Self-pay

## 2022-04-03 VITALS — BP 118/58 | HR 100 | Temp 99.0°F | Resp 18 | Ht 70.0 in | Wt 302.0 lb

## 2022-04-03 DIAGNOSIS — R059 Cough, unspecified: Secondary | ICD-10-CM | POA: Diagnosis not present

## 2022-04-03 DIAGNOSIS — J3489 Other specified disorders of nose and nasal sinuses: Secondary | ICD-10-CM | POA: Diagnosis not present

## 2022-04-03 DIAGNOSIS — R739 Hyperglycemia, unspecified: Secondary | ICD-10-CM

## 2022-04-03 DIAGNOSIS — K760 Fatty (change of) liver, not elsewhere classified: Secondary | ICD-10-CM

## 2022-04-03 DIAGNOSIS — R0981 Nasal congestion: Secondary | ICD-10-CM

## 2022-04-03 LAB — POCT INFLUENZA A/B
Influenza A, POC: NEGATIVE
Influenza B, POC: NEGATIVE

## 2022-04-03 LAB — POC COVID19 BINAXNOW: SARS Coronavirus 2 Ag: NEGATIVE

## 2022-04-03 MED ORDER — FLUTICASONE PROPIONATE 50 MCG/ACT NA SUSP
2.0000 | Freq: Every day | NASAL | 1 refills | Status: DC
  Filled 2022-04-03: qty 16, 30d supply, fill #0
  Filled 2022-06-09: qty 16, 30d supply, fill #1

## 2022-04-03 MED ORDER — BENZONATATE 100 MG PO CAPS
100.0000 mg | ORAL_CAPSULE | Freq: Three times a day (TID) | ORAL | 0 refills | Status: DC | PRN
  Filled 2022-04-03: qty 30, 10d supply, fill #0

## 2022-04-03 MED ORDER — AZITHROMYCIN 250 MG PO TABS
ORAL_TABLET | ORAL | 0 refills | Status: AC
  Filled 2022-04-03: qty 6, 5d supply, fill #0

## 2022-04-03 NOTE — Patient Instructions (Addendum)
URI type symptoms initially for 3 days  with significant sinus pressure this morning and productive cough. Worth noting uri symptoms 2 weeks ago  that gradually subsided then seemed to reoccur this  past Friday.  Both rapid flu and covid negative.   Rx flonase for nasal congestion, benzonatate for cough and azithromycin antibiotic.  Do recommend that you continue your breo inhaler once dailly  Discussed elevated sugar levels today. Recommend low sugar diet and will get A1c/s month blood sugar average test.  Also discussed fatty liver as on [place].  Follow up in 7-10 days or sooner if needed.

## 2022-04-03 NOTE — Progress Notes (Addendum)
Subjective:    Patient ID: Ryan Holland, male    DOB: 21-Apr-1970, 52 y.o.   MRN: [id number]  HPI  Pt states around Friday he woke up feeling very nasal congested with sinus pressure. Pt state 2 weeks ago had cold/uri. Seemed to get better except for lingering cough then Friday seemed to get sick again. Mild chills and fatigue. He did report some bodyaches over the weekend. Coughing up some mucus.   This morning when he blow nose got blood tinged color mucus.  In past when had allergies or uri could wheeze.    Review of Systems  Constitutional:  Negative for chills and fatigue.  Respiratory:  Positive for cough. Negative for chest tightness and wheezing.   Cardiovascular:  Negative for chest pain and palpitations.  Gastrointestinal:  Negative for abdominal pain.  Musculoskeletal:  Negative for back pain, joint swelling and neck pain.  Skin:  Negative for rash.   Past Medical History:  Diagnosis Date   Anxiety    GERD (gastroesophageal reflux disease)    OCCAS-WOULD USE OTC MED IF NEEDED   HNP (herniated nucleus pulposus), lumbar    PAIN IN LOWER BACK AND PAIN DOWN BOTH LEGS -12-24-14 radiates down right leg presently.   Hypertension    Sleep apnea    HAS CPAP-DOES NOT USE-DOES NOT KNOW SETTINGS     Social History   Socioeconomic History   Marital status: Married    Spouse name: Not on file   Number of children: Not on file   Years of education: Not on file   Highest education level: Not on file  Occupational History   Not on file  Tobacco Use   Smoking status: Former    Packs/day: 1.00    Years: 20.00    Total pack years: 20.00    Types: Cigarettes    Quit date: 10/05/2011    Years since quitting: 10.5   Smokeless tobacco: Former    Types: Chew  Substance and Sexual Activity   Alcohol use: Yes    Alcohol/week: 0.0 standard drinks of alcohol    Comment: 2 MIXED DRINKS (VODKA) DAILY-usually    Drug use: No   Sexual activity: Not on file  Other Topics  Concern   Not on file  Social History Narrative   Not on file   Social Determinants of Health   Financial Resource Strain: Not on file  Food Insecurity: Not on file  Transportation Needs: Not on file  Physical Activity: Not on file  Stress: Not on file  Social Connections: Not on file  Intimate Partner Violence: Not on file    Past Surgical History:  Procedure Laterality Date   01/05/2011 LUMBAR DECOMPRESSION AND MICRODISCECTOMY     CERVICAL FUSION  C4-5      ESOPHAGOGASTRODUODENOSCOPY (EGD) WITH PROPOFOL N/A 12/30/2014   Procedure: ESOPHAGOGASTRODUODENOSCOPY (EGD) WITH PROPOFOL;  Surgeon: Arta Silence, MD;  Location: [facility];  Service: Endoscopy;  Laterality: N/A;  With Bravo   LUMBAR LAMINECTOMY/DECOMPRESSION MICRODISCECTOMY  07/26/2011   Procedure: LUMBAR LAMINECTOMY/DECOMPRESSION MICRODISCECTOMY;  Surgeon: Johnn Hai, MD;  Location: [facility];  Service: Orthopedics;  Laterality: Right;  Re-Do Lumbar Decompression L5-S1 on Right,    PILONIDAL CYST EXCISION      Family History  Problem Relation Age of Onset   COPD Father    Congestive Heart Failure Father    Breast cancer Mother     Allergies  Allergen Reactions   Other     LEMON-sores in mouth  Current Outpatient Medications on File Prior to Visit  Medication Sig Dispense Refill   cetirizine (ZYRTEC) 10 MG tablet Take 10 mg by mouth 2 (two) times daily.      citalopram (CELEXA) 40 MG tablet Take 1 tablet by mouth daily. 90 tablet 1   COVID-19 At Home Antigen Test (CARESTART COVID-19 HOME TEST) KIT Use as directed 4 each 0   fluticasone (FLONASE) 50 MCG/ACT nasal spray Place 2 sprays into both nostrils daily.     fluticasone furoate-vilanterol (BREO ELLIPTA) 200-25 MCG/ACT AEPB Inhale 1 puff into the lungs daily. 60 each 1   losartan (COZAAR) 100 MG tablet TAKE 1 TABLET BY MOUTH ONCE A DAY 90 tablet 1   methocarbamol (ROBAXIN) 500 MG tablet Take 1 tablet (500 mg total) by mouth every 6 (six) hours as needed  for muscle spasms. 90 tablet 0   montelukast (SINGULAIR) 10 MG tablet Take 1 tablet (10 mg total) by mouth daily. 90 tablet 3   pantoprazole (PROTONIX) 40 MG tablet Take 1 tablet (40 mg total) by mouth daily. 90 tablet 3   Vitamin D, Ergocalciferol, (DRISDOL) 1.25 MG (50000 UNIT) CAPS capsule TAKE 1 CAPSULE BY MOUTH EVERY 7 DAYS 12 capsule 0   Vitamin D, Ergocalciferol, (DRISDOL) 1.25 MG (50000 UNIT) CAPS capsule Take 1 capsule (50,000 Units total) by mouth every 7 (seven) days. 8 capsule 0   testosterone cypionate (DEPOTESTOSTERONE CYPIONATE) 200 MG/ML injection INJECT 1 ML (200 MG TOTAL) INTO THE MUSCLE EVERY 14 (FOURTEEN) DAYS. 6 mL 1   No current facility-administered medications on file prior to visit.    BP (!) 118/58   Pulse 100   Temp 99 F (37.2 C)   Resp 18   Ht 5\' 10"  (1.778 m)   Wt (!) 302 lb (137 kg)   SpO2 97%   BMI 43.33 kg/m        Objective:   Physical Exam  General- No acute distress. Pleasant patient. Neck- Full range of motion, no jvd Lungs- Clear, even and unlabored. Heart- regular rate and rhythm. Neurologic- CNII- XII grossly intact.  Heent- mild pressure to sinuses, ears canals clear with norma tm. Normal posterior pharynx. Lower ext- calfs symmetric. Negative homans signs.      Assessment & Plan:   Patient Instructions  URI type symptoms initially for 3 days  with significant sinus pressure this morning and productive cough. Worth noting uri symptoms 2 weeks ago  that gradually subsided then seemed to reoccur this  past Friday.  Both rapid flu and covid negative.   Rx flonase for nasal congestion, benzonatate for cough and azithromycin antibiotic.  Do recommend that you continue your breo inhaler once dailly  Discussed elevated sugar levels today. Recommend low sugar diet and will get A1c/s month blood sugar average test.  Follow up in 7-10 days or sooner if needed.   Mackie Pai, [name]

## 2022-04-03 NOTE — Addendum Note (Signed)
Addended by: Anabel Halon on: 04/03/2022 02:52 PM   Modules accepted: Orders, Level of Service

## 2022-04-04 LAB — HEMOGLOBIN A1C: Hgb A1c MFr Bld: 5.1 % (ref 4.6–6.5)

## 2022-06-09 ENCOUNTER — Other Ambulatory Visit (HOSPITAL_BASED_OUTPATIENT_CLINIC_OR_DEPARTMENT_OTHER): Payer: Self-pay

## 2022-07-29 ENCOUNTER — Other Ambulatory Visit: Payer: Self-pay | Admitting: Medical

## 2022-07-29 DIAGNOSIS — G8929 Other chronic pain: Secondary | ICD-10-CM

## 2022-08-01 ENCOUNTER — Other Ambulatory Visit (HOSPITAL_COMMUNITY): Payer: Self-pay

## 2022-08-01 MED ORDER — METHOCARBAMOL 500 MG PO TABS
500.0000 mg | ORAL_TABLET | Freq: Four times a day (QID) | ORAL | 0 refills | Status: DC | PRN
  Filled 2022-08-01 – 2022-08-21 (×2): qty 90, 23d supply, fill #0

## 2022-08-02 ENCOUNTER — Encounter: Payer: Self-pay | Admitting: Pharmacist

## 2022-08-02 ENCOUNTER — Other Ambulatory Visit: Payer: Self-pay

## 2022-08-07 ENCOUNTER — Other Ambulatory Visit: Payer: Self-pay

## 2022-08-21 ENCOUNTER — Other Ambulatory Visit (HOSPITAL_COMMUNITY): Payer: Self-pay

## 2022-08-21 ENCOUNTER — Other Ambulatory Visit (HOSPITAL_BASED_OUTPATIENT_CLINIC_OR_DEPARTMENT_OTHER): Payer: Self-pay

## 2022-08-21 ENCOUNTER — Other Ambulatory Visit: Payer: Self-pay

## 2022-08-22 ENCOUNTER — Telehealth: Payer: 59 | Admitting: Family Medicine

## 2022-08-22 DIAGNOSIS — H10023 Other mucopurulent conjunctivitis, bilateral: Secondary | ICD-10-CM | POA: Diagnosis not present

## 2022-08-22 DIAGNOSIS — J069 Acute upper respiratory infection, unspecified: Secondary | ICD-10-CM | POA: Diagnosis not present

## 2022-08-22 MED ORDER — IPRATROPIUM BROMIDE 0.03 % NA SOLN: 2.0000 | Freq: Two times a day (BID) | NASAL | 0 refills | Status: DC

## 2022-08-22 MED ORDER — POLYMYXIN B-TRIMETHOPRIM 10000-0.1 UNIT/ML-% OP SOLN: 1.0000 [drp] | Freq: Four times a day (QID) | OPHTHALMIC | 0 refills | Status: DC

## 2022-08-22 MED ORDER — PSEUDOEPH-BROMPHEN-DM 30-2-10 MG/5ML PO SYRP: 5.0000 mL | ORAL_SOLUTION | Freq: Four times a day (QID) | ORAL | 0 refills | Status: DC | PRN

## 2022-08-22 NOTE — Progress Notes (Signed)
Virtual Visit Consent   Ryan Holland, you are scheduled for a virtual visit with a [facility] provider today. Just as with appointments in the office, your consent must be obtained to participate. Your consent will be active for this visit and any virtual visit you may have with one of our providers in the next 365 days. If you have a MyChart account, a copy of this consent can be sent to you electronically.  As this is a virtual visit, video technology does not allow for your provider to perform a traditional examination. This may limit your provider's ability to fully assess your condition. If your provider identifies any concerns that need to be evaluated in person or the need to arrange testing (such as labs, EKG, etc.), we will make arrangements to do so. Although advances in technology are sophisticated, we cannot ensure that it will always work on either your end or our end. If the connection with a video visit is poor, the visit may have to be switched to a telephone visit. With either a video or telephone visit, we are not always able to ensure that we have a secure connection.  By engaging in this virtual visit, you consent to the provision of healthcare and authorize for your insurance to be billed (if applicable) for the services provided during this visit. Depending on your insurance coverage, you may receive a charge related to this service.  I need to obtain your verbal consent now. Are you willing to proceed with your visit today? Ryan Holland has provided verbal consent on 08/22/2022 for a virtual visit (video or telephone). Freddy Finner, NP  Date: 08/22/2022 11:47 AM  Virtual Visit via Video Note   I, Freddy Finner, connected with  Ryan Holland  ([id number], Nov 02, 1970) on 08/22/22 at 11:45 AM EDT by a video-enabled telemedicine application and verified that I am speaking with the correct person using two identifiers.  Location: Patient: Virtual Visit Location Patient:  Home Provider: Virtual Visit Location Provider: Home Office   I discussed the limitations of evaluation and management by telemedicine and the availability of in person appointments. The patient expressed understanding and agreed to proceed.    History of Present Illness: Ryan Holland is a 52 y.o. who identifies as a male who was assigned male at birth, and is being seen today for eye infection  Onset was yesterday both eyes sealed and crusted over Associated symptoms are nasal congestion, watery eyes, mild burning some, blurry vision, sore throat mild- more scratchy, swollen glands, pressure in ears, and cough Modifying factors are oral decongestant and benadryl  Denies chest pain, shortness of breath, fevers, chills, pain with eye movement  Exposure to sick contacts- unknown COVID test: no  History of allergies- but not like this  Problems:  Patient Active Problem List   Diagnosis Date Noted   Hypogonadism in male 05/07/2020   Vitamin D deficiency 05/07/2020   Cough 08/25/2014   Acid reflux 08/25/2014   Allergic rhinitis 08/25/2014    Allergies:  Allergies  Allergen Reactions   Other     LEMON-sores in mouth   Medications:  Current Outpatient Medications:    benzonatate (TESSALON) 100 MG capsule, Take 1 capsule (100 mg total) by mouth 3 (three) times daily as needed., Disp: 30 capsule, Rfl: 0   cetirizine (ZYRTEC) 10 MG tablet, Take 10 mg by mouth 2 (two) times daily. , Disp: , Rfl:    citalopram (CELEXA) 40 MG tablet, Take 1 tablet by mouth  daily., Disp: 90 tablet, Rfl: 1   COVID-19 At Home Antigen Test (CARESTART COVID-19 HOME TEST) KIT, Use as directed, Disp: 4 each, Rfl: 0   fluticasone (FLONASE) 50 MCG/ACT nasal spray, Place 2 sprays into both nostrils daily., Disp: , Rfl:    fluticasone (FLONASE) 50 MCG/ACT nasal spray, Place 2 sprays into both nostrils daily., Disp: 16 g, Rfl: 1   fluticasone furoate-vilanterol (BREO ELLIPTA) 200-25 MCG/ACT AEPB, Inhale 1 puff  into the lungs daily., Disp: 60 each, Rfl: 1   losartan (COZAAR) 100 MG tablet, TAKE 1 TABLET BY MOUTH ONCE A DAY, Disp: 90 tablet, Rfl: 1   methocarbamol (ROBAXIN) 500 MG tablet, Take 1 tablet (500 mg total) by mouth every 6 (six) hours as needed for muscle spasms., Disp: 90 tablet, Rfl: 0   montelukast (SINGULAIR) 10 MG tablet, Take 1 tablet (10 mg total) by mouth daily., Disp: 90 tablet, Rfl: 3   pantoprazole (PROTONIX) 40 MG tablet, Take 1 tablet (40 mg total) by mouth daily., Disp: 90 tablet, Rfl: 3   testosterone cypionate (DEPOTESTOSTERONE CYPIONATE) 200 MG/ML injection, INJECT 1 ML (200 MG TOTAL) INTO THE MUSCLE EVERY 14 (FOURTEEN) DAYS., Disp: 6 mL, Rfl: 1   Vitamin D, Ergocalciferol, (DRISDOL) 1.25 MG (50000 UNIT) CAPS capsule, TAKE 1 CAPSULE BY MOUTH EVERY 7 DAYS, Disp: 12 capsule, Rfl: 0   Vitamin D, Ergocalciferol, (DRISDOL) 1.25 MG (50000 UNIT) CAPS capsule, Take 1 capsule (50,000 Units total) by mouth every 7 (seven) days., Disp: 8 capsule, Rfl: 0  Observations/Objective: Patient is well-developed, well-nourished in no acute distress.  Resting comfortably  at home.  Head is normocephalic, atraumatic.  No labored breathing.  Speech is clear and coherent with logical content.  Patient is alert and oriented at baseline.  Bilateral pink crusting eyes EOM intact Congestion tone  And Cough  Assessment and Plan:  1. Viral URI with cough  - ipratropium (ATROVENT) 0.03 % nasal spray; Place 2 sprays into both nostrils every 12 (twelve) hours.  Dispense: 30 mL; Refill: 0 - brompheniramine-pseudoephedrine-DM 30-2-10 MG/5ML syrup; Take 5 mLs by mouth 4 (four) times daily as needed.  Dispense: 120 mL; Refill: 0  2. Pink eye disease of both eyes  - trimethoprim-polymyxin b (POLYTRIM) ophthalmic solution; Place 1 drop into both eyes in the morning, at noon, in the evening, and at bedtime.  Dispense: 10 mL; Refill: 0  -use medications has directed -strict in person if not  improving  Reviewed side effects, risks and benefits of medication.    Patient acknowledged agreement and understanding of the plan.   Past Medical, Surgical, Social History, Allergies, and Medications have been Reviewed.    Follow Up Instructions: I discussed the assessment and treatment plan with the patient. The patient was provided an opportunity to ask questions and all were answered. The patient agreed with the plan and demonstrated an understanding of the instructions.  A copy of instructions were sent to the patient via MyChart unless otherwise noted below.    The patient was advised to call back or seek an in-person evaluation if the symptoms worsen or if the condition fails to improve as anticipated.  Time:  I spent 12 minutes with the patient via telehealth technology discussing the above problems/concerns.    Freddy Finner, NP

## 2022-08-22 NOTE — Patient Instructions (Addendum)
Ryan Holland, thank you for joining Freddy Finner, NP for today's virtual visit.  While this provider is not your primary care provider (PCP), if your PCP is located in our provider database this encounter information will be shared with them immediately following your visit.   A [facility] MyChart account gives you access to today's visit and all your visits, tests, and labs performed at [facility] " click here if you don't have a [facility] MyChart account or go to [email or web address]  Consent: (Patient) Ryan Holland provided verbal consent for this virtual visit at the beginning of the encounter.  Current Medications:  Current Outpatient Medications:    benzonatate (TESSALON) 100 MG capsule, Take 1 capsule (100 mg total) by mouth 3 (three) times daily as needed., Disp: 30 capsule, Rfl: 0   cetirizine (ZYRTEC) 10 MG tablet, Take 10 mg by mouth 2 (two) times daily. , Disp: , Rfl:    citalopram (CELEXA) 40 MG tablet, Take 1 tablet by mouth daily., Disp: 90 tablet, Rfl: 1   COVID-19 At Home Antigen Test (CARESTART COVID-19 HOME TEST) KIT, Use as directed, Disp: 4 each, Rfl: 0   fluticasone (FLONASE) 50 MCG/ACT nasal spray, Place 2 sprays into both nostrils daily., Disp: , Rfl:    fluticasone (FLONASE) 50 MCG/ACT nasal spray, Place 2 sprays into both nostrils daily., Disp: 16 g, Rfl: 1   fluticasone furoate-vilanterol (BREO ELLIPTA) 200-25 MCG/ACT AEPB, Inhale 1 puff into the lungs daily., Disp: 60 each, Rfl: 1   losartan (COZAAR) 100 MG tablet, TAKE 1 TABLET BY MOUTH ONCE A DAY, Disp: 90 tablet, Rfl: 1   methocarbamol (ROBAXIN) 500 MG tablet, Take 1 tablet (500 mg total) by mouth every 6 (six) hours as needed for muscle spasms., Disp: 90 tablet, Rfl: 0   montelukast (SINGULAIR) 10 MG tablet, Take 1 tablet (10 mg total) by mouth daily., Disp: 90 tablet, Rfl: 3   pantoprazole (PROTONIX) 40 MG tablet, Take 1 tablet (40 mg total) by mouth daily., Disp: 90 tablet, Rfl: 3    testosterone cypionate (DEPOTESTOSTERONE CYPIONATE) 200 MG/ML injection, INJECT 1 ML (200 MG TOTAL) INTO THE MUSCLE EVERY 14 (FOURTEEN) DAYS., Disp: 6 mL, Rfl: 1   Vitamin D, Ergocalciferol, (DRISDOL) 1.25 MG (50000 UNIT) CAPS capsule, TAKE 1 CAPSULE BY MOUTH EVERY 7 DAYS, Disp: 12 capsule, Rfl: 0   Vitamin D, Ergocalciferol, (DRISDOL) 1.25 MG (50000 UNIT) CAPS capsule, Take 1 capsule (50,000 Units total) by mouth every 7 (seven) days., Disp: 8 capsule, Rfl: 0   Medications ordered in this encounter:  No orders of the defined types were placed in this encounter.    *If you need refills on other medications prior to your next appointment, please contact your pharmacy*  Follow-Up: Call back or seek an in-person evaluation if the symptoms worsen or if the condition fails to improve as anticipated.  [facility] [phone]  Other Instructions  -use medication as directed -work note in letters on mychart  Follow up if not improved or worsening   If you have been instructed to have an in-person evaluation today at a local Urgent Care facility, please use the link below. It will take you to a list of all of our available [facility], including address, phone number and hours of operation. Please do not delay care.  [facility]  If you or a family member do not have a primary care provider, use the link below to schedule a visit and  establish care. When you choose a [facility] primary care physician or advanced practice provider, you gain a long-term partner in health. Find a Primary Care Provider  Learn more about [place] [facility]'s in-office and virtual care options: [facility]

## 2022-09-27 ENCOUNTER — Other Ambulatory Visit: Payer: Self-pay | Admitting: Family Medicine

## 2022-09-27 ENCOUNTER — Other Ambulatory Visit: Payer: Self-pay

## 2022-09-27 ENCOUNTER — Other Ambulatory Visit (HOSPITAL_BASED_OUTPATIENT_CLINIC_OR_DEPARTMENT_OTHER): Payer: Self-pay

## 2022-09-27 ENCOUNTER — Other Ambulatory Visit: Payer: Self-pay | Admitting: Medical

## 2022-09-27 DIAGNOSIS — G8929 Other chronic pain: Secondary | ICD-10-CM

## 2022-09-27 DIAGNOSIS — J069 Acute upper respiratory infection, unspecified: Secondary | ICD-10-CM

## 2022-09-27 DIAGNOSIS — F339 Major depressive disorder, recurrent, unspecified: Secondary | ICD-10-CM

## 2022-09-27 DIAGNOSIS — I10 Essential (primary) hypertension: Secondary | ICD-10-CM

## 2022-09-27 MED ORDER — LOSARTAN POTASSIUM 100 MG PO TABS
100.0000 mg | ORAL_TABLET | Freq: Every day | ORAL | 0 refills | Status: DC
  Filled 2022-09-27 (×2): qty 30, 30d supply, fill #0

## 2022-09-27 MED ORDER — FLUTICASONE PROPIONATE 50 MCG/ACT NA SUSP
2.0000 | Freq: Every day | NASAL | 0 refills | Status: AC
  Filled 2022-09-27 (×2): qty 16, 30d supply, fill #0

## 2022-09-27 MED ORDER — CITALOPRAM HYDROBROMIDE 40 MG PO TABS
40.0000 mg | ORAL_TABLET | Freq: Every day | ORAL | 0 refills | Status: DC
  Filled 2022-09-27 (×2): qty 30, 30d supply, fill #0

## 2022-10-25 ENCOUNTER — Other Ambulatory Visit: Payer: Self-pay | Admitting: Medical

## 2022-10-25 DIAGNOSIS — I10 Essential (primary) hypertension: Secondary | ICD-10-CM

## 2022-10-25 DIAGNOSIS — F339 Major depressive disorder, recurrent, unspecified: Secondary | ICD-10-CM

## 2022-10-26 ENCOUNTER — Ambulatory Visit (HOSPITAL_BASED_OUTPATIENT_CLINIC_OR_DEPARTMENT_OTHER)
Admission: RE | Admit: 2022-10-26 | Discharge: 2022-10-26 | Disposition: A | Discharge status: Home / Self Care | Payer: 59 | Source: Ambulatory Visit | Attending: Family Medicine | Admitting: Family Medicine

## 2022-10-26 ENCOUNTER — Other Ambulatory Visit: Payer: Self-pay

## 2022-10-26 ENCOUNTER — Encounter: Payer: Self-pay | Admitting: Family Medicine

## 2022-10-26 ENCOUNTER — Telehealth: Payer: Self-pay

## 2022-10-26 ENCOUNTER — Ambulatory Visit (INDEPENDENT_AMBULATORY_CARE_PROVIDER_SITE_OTHER): Payer: 59 | Admitting: Family Medicine

## 2022-10-26 VITALS — BP 110/70 | HR 118 | Temp 99.0°F | Resp 22 | Ht 70.0 in | Wt 303.4 lb

## 2022-10-26 DIAGNOSIS — R55 Syncope and collapse: Secondary | ICD-10-CM

## 2022-10-26 DIAGNOSIS — R0602 Shortness of breath: Secondary | ICD-10-CM | POA: Insufficient documentation

## 2022-10-26 DIAGNOSIS — R000 Tachycardia, unspecified: Secondary | ICD-10-CM | POA: Diagnosis not present

## 2022-10-26 DIAGNOSIS — I6522 Occlusion and stenosis of left carotid artery: Secondary | ICD-10-CM | POA: Diagnosis not present

## 2022-10-26 DIAGNOSIS — I10 Essential (primary) hypertension: Secondary | ICD-10-CM | POA: Diagnosis not present

## 2022-10-26 LAB — D-DIMER, QUANTITATIVE: D-Dimer, Quant: 0.6 ug{FEU}/mL — ABNORMAL HIGH (ref <0.50)

## 2022-10-26 MED ORDER — CITALOPRAM HYDROBROMIDE 40 MG PO TABS
40.0000 mg | ORAL_TABLET | Freq: Every day | ORAL | 0 refills | Status: DC
  Filled 2022-10-26: qty 90, 90d supply, fill #0

## 2022-10-26 MED ORDER — LOSARTAN POTASSIUM 100 MG PO TABS
100.0000 mg | ORAL_TABLET | Freq: Every day | ORAL | 0 refills | Status: DC
  Filled 2022-10-26: qty 90, 90d supply, fill #0

## 2022-10-26 NOTE — Telephone Encounter (Signed)
 Appt today w/ Dr. Etter Sjogren

## 2022-10-26 NOTE — Assessment & Plan Note (Signed)
EKG -- sinus tachy Check labs inc d dimer Cxr stat Go to ER if you develop cp or inc shortness of breath

## 2022-10-26 NOTE — Telephone Encounter (Signed)
Initial Comment Caller states he has had elevated BP . He is getting short spurts of dizziness and SOB. Translation No Nurse Assessment Nurse: Annye English, RN, Denise Date/Time (Eastern Time): 10/26/2022 9:03:56 AM Confirm and document reason for call. If symptomatic, describe symptoms. ---Pt reports his BP is elevated but he does not have a way to ck it now. States this weekend the bp got up to 196/123. States he is also having intermittent SOB. Does the patient have any new or worsening symptoms? ---Yes Will a triage be completed? ---Yes Related visit to physician within the last 2 weeks? ---No Does the PT have any chronic conditions? (i.e. diabetes, asthma, this includes High risk factors for pregnancy, etc.) ---Yes List chronic conditions. ---Obesity, HTN Is this a behavioral health or substance abuse call? ---No Guidelines Guideline Title Affirmed Question Affirmed Notes Nurse Date/Time (Eastern Time) Breathing Difficulty [1] MILD difficulty breathing (e.g., minimal/no SOB at rest, SOB with walking, pulse <100) AND [2] NEW-onset or WORSE than normal Carmon, RN, Angelique Blonder 10/26/2022 9:06:29 AM PLEASE NOTE: All timestamps contained within this report are represented as [place] Standard Time. CONFIDENTIALTY NOTICE: This fax transmission is intended only for the addressee. It contains information that is legally privileged, confidential or otherwise protected from use or disclosure. If you are not the intended recipient, you are strictly prohibited from reviewing, disclosing, copying using or disseminating any of this information or taking any action in reliance on or regarding this information. If you have received this fax in error, please notify us immediately by telephone so that we can arrange for its return to [place]. Phone: [phone], Toll-Free: [phone], Fax: [phone] Page: 2 of 2 Call Id: [id number] Disp. Time [name] Time) Disposition Final User 10/26/2022 9:00:17 AM  Send to Urgent Queue Talmadge Coventry 10/26/2022 9:09:22 AM See HCP within 4 Hours (or PCP triage) Yes Annye English, RN, Angelique Blonder Final Disposition 10/26/2022 9:09:22 AM See HCP within 4 Hours (or PCP triage) Yes Carmon, RN, Leighton Ruff Disagree/Comply Comply Caller Understands Yes PreDisposition Call Doctor Care Advice Given Per Guideline SEE HCP (OR PCP TRIAGE) WITHIN 4 HOURS: CALL BACK IF: * You become worse CARE ADVICE given per Breathing Difficulty (Adult) guideline. Referrals REFERRED TO PCP OFFICE

## 2022-10-26 NOTE — Progress Notes (Signed)
+  Established Patient Office Visit  Subjective   Patient ID: Ryan Holland, male    DOB: December 10, 1970  Age: 52 y.o. MRN: [id number]  Chief Complaint  Patient presents with   Shortness of Breath    Sxs started 3-4weeks ago, having SOB, dizziness, headache, no chest pain. Pt states blood pressure was 196/135.     HPI Discussed the use of AI scribe software for clinical note transcription with the patient, who gave verbal consent to proceed.  History of Present Illness   The patient, with a history of smoking and a family history of carotid artery disease, presents with vertigo, shortness of breath, and tachycardia. The vertigo, described as 'micro' episodes, is localized to the frontal region and was initially attributed to missed doses of citalopram. However, the patient confirmed adherence to his medication regimen. The shortness of breath has been ongoing for several weeks and has been noticed by coworkers.  The patient's blood pressure was found to be high over the weekend, measuring 196/135, although the patient acknowledges that the cuff used may not have been the correct size. The patient also reports occasional leg swelling.  The patient has a history of allergies, resulting in frequent coughing and lung clearing. He has been given inhalers in the past for severe allergy symptoms but does not use them regularly. He recently started using a dry powder inhaler (possibly Breo), but reports no significant improvement.  The patient quit smoking in 2013, having smoked heavily from 2000 to 2013. He also reports that his father required carotid artery cleaning in his early fifties.      Patient Active Problem List   Diagnosis Date Noted   SOB (shortness of breath) 10/26/2022   Near syncope 10/26/2022   Hypogonadism in male 05/07/2020   Vitamin D deficiency 05/07/2020   Cough 08/25/2014   Acid reflux 08/25/2014   Allergic rhinitis 08/25/2014   Past Medical History:  Diagnosis Date    Anxiety    GERD (gastroesophageal reflux disease)    OCCAS-WOULD USE OTC MED IF NEEDED   HNP (herniated nucleus pulposus), lumbar    PAIN IN LOWER BACK AND PAIN DOWN BOTH LEGS -12-24-14 radiates down right leg presently.   Hypertension    Sleep apnea    HAS CPAP-DOES NOT USE-DOES NOT KNOW SETTINGS   Past Surgical History:  Procedure Laterality Date   01/05/2011 LUMBAR DECOMPRESSION AND MICRODISCECTOMY     CERVICAL FUSION  C4-5      ESOPHAGOGASTRODUODENOSCOPY (EGD) WITH PROPOFOL N/A 12/30/2014   Procedure: ESOPHAGOGASTRODUODENOSCOPY (EGD) WITH PROPOFOL;  Surgeon: Willis Modena, MD;  Location: [facility];  Service: Endoscopy;  Laterality: N/A;  With Bravo   LUMBAR LAMINECTOMY/DECOMPRESSION MICRODISCECTOMY  07/26/2011   Procedure: LUMBAR LAMINECTOMY/DECOMPRESSION MICRODISCECTOMY;  Surgeon: Javier Docker, MD;  Location: [facility];  Service: Orthopedics;  Laterality: Right;  Re-Do Lumbar Decompression L5-S1 on Right,    PILONIDAL CYST EXCISION     Social History   Tobacco Use   Smoking status: Former    Current packs/day: 0.00    Average packs/day: 2.0 packs/day for 20.0 years (40.0 ttl pk-yrs)    Types: Cigarettes    Start date: 10/05/1991    Quit date: 10/05/2011    Years since quitting: 11.0   Smokeless tobacco: Former    Types: Chew  Substance Use Topics   Alcohol use: Yes    Alcohol/week: 0.0 standard drinks of alcohol    Comment: 2 MIXED DRINKS (VODKA) DAILY-usually    Drug use: No  Social History   Socioeconomic History   Marital status: Married    Spouse name: Not on file   Number of children: Not on file   Years of education: Not on file   Highest education level: Not on file  Occupational History   Not on file  Tobacco Use   Smoking status: Former    Current packs/day: 0.00    Average packs/day: 2.0 packs/day for 20.0 years (40.0 ttl pk-yrs)    Types: Cigarettes    Start date: 10/05/1991    Quit date: 10/05/2011    Years since quitting: 11.0   Smokeless tobacco:  Former    Types: Chew  Substance and Sexual Activity   Alcohol use: Yes    Alcohol/week: 0.0 standard drinks of alcohol    Comment: 2 MIXED DRINKS (VODKA) DAILY-usually    Drug use: No   Sexual activity: Not on file  Other Topics Concern   Not on file  Social History Narrative   Not on file   Social Determinants of Health   Financial Resource Strain: Not on file  Food Insecurity: Not on file  Transportation Needs: Not on file  Physical Activity: Not on file  Stress: Not on file  Social Connections: Unknown (07/17/2021)   Received from [facility], [facility]   Social Network    Social Network: Not on file  Intimate Partner Violence: Unknown (06/08/2021)   Received from [facility], [facility]   HITS    Physically Hurt: Not on file    Insult or Talk Down To: Not on file    Threaten Physical Harm: Not on file    Scream or Curse: Not on file   Family Status  Relation Name Status   Father  (Not Specified)   Mother  (Not Specified)  No partnership data on file   Family History  Problem Relation Age of Onset   COPD Father    Congestive Heart Failure Father    Breast cancer Mother    Allergies  Allergen Reactions   Other     LEMON-sores in mouth      Review of Systems  Constitutional:  Negative for chills, fever and malaise/fatigue.  HENT:  Negative for congestion and hearing loss.   Eyes:  Negative for blurred vision and discharge.  Respiratory:  Negative for cough, sputum production and shortness of breath.   Cardiovascular:  Negative for chest pain, palpitations and leg swelling.  Gastrointestinal:  Negative for abdominal pain, blood in stool, constipation, diarrhea, heartburn, nausea and vomiting.  Genitourinary:  Negative for dysuria, frequency, hematuria and urgency.  Musculoskeletal:  Negative for back pain, falls and myalgias.  Skin:  Negative for rash.  Neurological:  Negative for dizziness, sensory change, loss of consciousness, weakness and  headaches.  Endo/Heme/Allergies:  Negative for environmental allergies. Does not bruise/bleed easily.  Psychiatric/Behavioral:  Negative for depression and suicidal ideas. The patient is not nervous/anxious and does not have insomnia.       Objective:     BP 110/70 (BP Location: Right Arm, Patient Position: Sitting, Cuff Size: Large)   Pulse (!) 118   Temp 99 F (37.2 C) (Oral)   Resp (!) 22   Ht 5\' 10"  (1.778 m)   Wt (!) 303 lb 6.4 oz (137.6 kg)   SpO2 98%   BMI 43.53 kg/m  BP Readings from Last 3 Encounters:  10/26/22 110/70  04/03/22 (!) 118/58  03/16/22 (!) 142/88   Wt Readings from Last 3 Encounters:  10/26/22 Marland Kitchen)  303 lb 6.4 oz (137.6 kg)  04/03/22 (!) 302 lb (137 kg)  03/16/22 298 lb (135.2 kg)   SpO2 Readings from Last 3 Encounters:  10/26/22 98%  04/03/22 97%  03/16/22 96%      Physical Exam Vitals and nursing note reviewed.  Constitutional:      General: He is not in acute distress.    Appearance: Normal appearance. He is well-developed.  HENT:     Head: Normocephalic and atraumatic.  Eyes:     General: No scleral icterus.       Right eye: No discharge.        Left eye: No discharge.  Cardiovascular:     Rate and Rhythm: Normal rate and regular rhythm.     Heart sounds: No murmur heard. Pulmonary:     Effort: Pulmonary effort is normal. No respiratory distress.     Breath sounds: Normal breath sounds.  Musculoskeletal:        General: Normal range of motion.     Cervical back: Normal range of motion and neck supple.     Right lower leg: 1+ Edema present.     Left lower leg: 1+ Edema present.  Skin:    General: Skin is warm and dry.  Neurological:     General: No focal deficit present.     Mental Status: He is alert and oriented to person, place, and time.  Psychiatric:        Mood and Affect: Mood normal.        Behavior: Behavior normal.        Thought Content: Thought content normal.        Judgment: Judgment normal.      No results  found for any visits on 10/26/22.  Last CBC Lab Results  Component Value Date   WBC 12.4 (H) 03/17/2021   HGB 16.0 03/17/2021   HCT 45.2 03/17/2021   MCV 94 03/17/2021   MCH 33.3 (H) 03/17/2021   RDW 11.6 03/17/2021   PLT 315 03/17/2021   Last metabolic panel Lab Results  Component Value Date   GLUCOSE 123 (H) 02/20/2022   NA 136 02/20/2022   K 3.9 02/20/2022   CL 101 02/20/2022   CO2 24 02/20/2022   BUN 16 02/20/2022   CREATININE 0.82 02/20/2022   GFR 102.92 02/20/2022   CALCIUM 9.3 02/20/2022   PROT 7.0 02/20/2022   ALBUMIN 4.1 02/20/2022   LABGLOB 2.3 04/19/2020   AGRATIO 2.0 04/19/2020   BILITOT 1.4 (H) 02/20/2022   ALKPHOS 83 02/20/2022   AST 75 (H) 02/20/2022   ALT 82 (H) 02/20/2022   Last lipids Lab Results  Component Value Date   CHOL 237 (H) 02/20/2022   HDL 62.00 02/20/2022   LDLCALC 110 (H) 04/19/2020   LDLDIRECT 129.0 02/20/2022   TRIG 212.0 (H) 02/20/2022   CHOLHDL 4 02/20/2022   Last hemoglobin A1c Lab Results  Component Value Date   HGBA1C 5.1 04/03/2022   Last thyroid functions No results found for: "TSH", "T3TOTAL", "T4TOTAL", "THYROIDAB" Last vitamin D Lab Results  Component Value Date   VD25OH 24.80 (L) 02/20/2022   Last vitamin B12 and Folate No results found for: "VITAMINB12", "FOLATE"    The 10-year ASCVD risk score (Arnett DK, et al., 2019) is: 3.1%    Assessment & Plan:   Problem List Items Addressed This Visit       Unprioritized   SOB (shortness of breath) - Primary    EKG -- sinus tachy Check  labs inc d dimer Cxr stat Go to ER if you develop cp or inc shortness of breath       Relevant Orders   EKG 12-Lead (Completed)   CBC with Differential/Platelet   Comprehensive metabolic panel   Lipid panel   TSH   D-Dimer, Quantitative   DG Chest 2 View   US Carotid Duplex Bilateral   Near syncope   Relevant Orders   US Carotid Duplex Bilateral  Assessment and Plan    Tachycardia and Hypertension Recent onset  of tachycardia with a heart rate of 118. Blood pressure was normal at the time of visit but reported high readings at home. Patient has a history of smoking and family history of carotid artery disease. -Order labs and chest x-ray to rule out pulmonary embolism. -Order carotid ultrasound to assess for carotid artery disease. -Advise patient to purchase a home blood pressure cuff and pulse oximeter to monitor vitals at home.  Vertigo Patient reports recent onset of vertigo, described as a sensation in the front of the head. Patient is currently on citalopram. -Consider further neurological evaluation if symptoms persist.  Chronic Allergies Chronic cough and use of Breo inhaler without significant relief. -Continue current management and monitor symptoms.  Smoking History Patient quit smoking in 2013 but has a significant pack-year history. -Encourage continued smoking cessation.  Follow-up Pending results of labs, chest x-ray, and carotid ultrasound. If D-dimer is high, patient will be advised to go to the emergency room or a CT scan will be ordered if time permits.        No follow-ups on file.    Donato Schultz, DO

## 2022-10-27 ENCOUNTER — Ambulatory Visit (HOSPITAL_BASED_OUTPATIENT_CLINIC_OR_DEPARTMENT_OTHER)
Admission: RE | Admit: 2022-10-27 | Discharge: 2022-10-27 | Disposition: A | Discharge status: Home / Self Care | Payer: 59 | Source: Ambulatory Visit | Attending: Family Medicine | Admitting: Family Medicine

## 2022-10-27 ENCOUNTER — Other Ambulatory Visit: Payer: Self-pay | Admitting: Family Medicine

## 2022-10-27 DIAGNOSIS — R0602 Shortness of breath: Secondary | ICD-10-CM | POA: Insufficient documentation

## 2022-10-27 DIAGNOSIS — K760 Fatty (change of) liver, not elsewhere classified: Secondary | ICD-10-CM | POA: Diagnosis not present

## 2022-10-27 DIAGNOSIS — R918 Other nonspecific abnormal finding of lung field: Secondary | ICD-10-CM | POA: Diagnosis not present

## 2022-10-27 DIAGNOSIS — R7989 Other specified abnormal findings of blood chemistry: Secondary | ICD-10-CM | POA: Insufficient documentation

## 2022-10-27 DIAGNOSIS — J189 Pneumonia, unspecified organism: Secondary | ICD-10-CM

## 2022-10-27 LAB — COMPREHENSIVE METABOLIC PANEL
ALT: 68 U/L — ABNORMAL HIGH (ref 0–53)
AST: 97 U/L — ABNORMAL HIGH (ref 0–37)
Albumin: 3.8 g/dL (ref 3.5–5.2)
Alkaline Phosphatase: 133 U/L — ABNORMAL HIGH (ref 39–117)
BUN: 14 mg/dL (ref 6–23)
CO2: 25 meq/L (ref 19–32)
Calcium: 9.2 mg/dL (ref 8.4–10.5)
Chloride: 102 meq/L (ref 96–112)
Creatinine, Ser: 0.94 mg/dL (ref 0.40–1.50)
GFR: 94.52 mL/min (ref >60.00)
Glucose, Bld: 123 mg/dL — ABNORMAL HIGH (ref 70–99)
Potassium: 4.1 meq/L (ref 3.5–5.1)
Sodium: 136 meq/L (ref 135–145)
Total Bilirubin: 1.2 mg/dL (ref 0.2–1.2)
Total Protein: 7.3 g/dL (ref 6.0–8.3)

## 2022-10-27 LAB — CBC WITH DIFFERENTIAL/PLATELET
Basophils Absolute: 0.1 10*3/uL (ref 0.0–0.1)
Basophils Relative: 0.7 % (ref 0.0–3.0)
Eosinophils Absolute: 0.2 10*3/uL (ref 0.0–0.7)
Eosinophils Relative: 2.7 % (ref 0.0–5.0)
HCT: 43.8 % (ref 39.0–52.0)
Hemoglobin: 15.1 g/dL (ref 13.0–17.0)
Lymphocytes Relative: 17.5 % (ref 12.0–46.0)
Lymphs Abs: 1.5 10*3/uL (ref 0.7–4.0)
MCHC: 34.4 g/dL (ref 30.0–36.0)
MCV: 101.6 fl — ABNORMAL HIGH (ref 78.0–100.0)
Monocytes Absolute: 0.8 10*3/uL (ref 0.1–1.0)
Monocytes Relative: 9.7 % (ref 3.0–12.0)
Neutro Abs: 5.8 10*3/uL (ref 1.4–7.7)
Neutrophils Relative %: 69.4 % (ref 43.0–77.0)
Platelets: 247 10*3/uL (ref 150.0–400.0)
RBC: 4.31 Mil/uL (ref 4.22–5.81)
RDW: 13.4 % (ref 11.5–15.5)
WBC: 8.3 10*3/uL (ref 4.0–10.5)

## 2022-10-27 LAB — LIPID PANEL
Cholesterol: 274 mg/dL — ABNORMAL HIGH (ref 0–200)
HDL: 59.7 mg/dL (ref >39.00)
NonHDL: 214.73
Total CHOL/HDL Ratio: 5
Triglycerides: 340 mg/dL — ABNORMAL HIGH (ref 0.0–149.0)
VLDL: 68 mg/dL — ABNORMAL HIGH (ref 0.0–40.0)

## 2022-10-27 LAB — TSH: TSH: 1.13 u[IU]/mL (ref 0.35–5.50)

## 2022-10-27 LAB — LDL CHOLESTEROL, DIRECT: Direct LDL: 189 mg/dL

## 2022-10-27 MED ORDER — IOHEXOL 350 MG/ML SOLN
100.0000 mL | Freq: Once | INTRAVENOUS | Status: AC | PRN
  Administered 2022-10-27: 100 mL via INTRAVENOUS

## 2022-10-27 MED ORDER — AZITHROMYCIN 250 MG PO TABS: ORAL_TABLET | ORAL | 0 refills | Status: DC

## 2022-10-28 ENCOUNTER — Encounter: Payer: Self-pay | Admitting: Family Medicine

## 2022-10-30 ENCOUNTER — Other Ambulatory Visit: Payer: Self-pay

## 2022-10-30 ENCOUNTER — Ambulatory Visit: Payer: 59 | Admitting: Medical

## 2022-10-30 DIAGNOSIS — J189 Pneumonia, unspecified organism: Secondary | ICD-10-CM

## 2022-10-30 MED ORDER — AZITHROMYCIN 250 MG PO TABS: ORAL_TABLET | ORAL | 0 refills | Status: DC

## 2022-10-30 MED ORDER — AMOXICILLIN-POT CLAVULANATE 875-125 MG PO TABS: 1.0000 | ORAL_TABLET | Freq: Two times a day (BID) | ORAL | 0 refills | Status: DC

## 2022-11-02 ENCOUNTER — Encounter (HOSPITAL_COMMUNITY): Payer: Self-pay

## 2022-11-02 ENCOUNTER — Other Ambulatory Visit: Payer: Self-pay

## 2022-11-02 ENCOUNTER — Emergency Department (HOSPITAL_BASED_OUTPATIENT_CLINIC_OR_DEPARTMENT_OTHER): Payer: 59

## 2022-11-02 ENCOUNTER — Emergency Department (HOSPITAL_COMMUNITY)
Admission: EM | Admit: 2022-11-02 | Discharge: 2022-11-02 | Disposition: A | Discharge status: Home / Self Care | Payer: 59 | Attending: Emergency Medicine | Admitting: Emergency Medicine

## 2022-11-02 DIAGNOSIS — R7989 Other specified abnormal findings of blood chemistry: Secondary | ICD-10-CM | POA: Diagnosis not present

## 2022-11-02 DIAGNOSIS — M7989 Other specified soft tissue disorders: Secondary | ICD-10-CM

## 2022-11-02 DIAGNOSIS — I10 Essential (primary) hypertension: Secondary | ICD-10-CM | POA: Diagnosis not present

## 2022-11-02 DIAGNOSIS — Z79899 Other long term (current) drug therapy: Secondary | ICD-10-CM | POA: Diagnosis not present

## 2022-11-02 DIAGNOSIS — R2242 Localized swelling, mass and lump, left lower limb: Secondary | ICD-10-CM | POA: Insufficient documentation

## 2022-11-02 DIAGNOSIS — M79605 Pain in left leg: Secondary | ICD-10-CM

## 2022-11-02 LAB — CBC WITH DIFFERENTIAL/PLATELET
Abs Immature Granulocytes: 0.06 10*3/uL (ref 0.00–0.07)
Basophils Absolute: 0.1 10*3/uL (ref 0.0–0.1)
Basophils Relative: 1 %
Eosinophils Absolute: 0.4 10*3/uL (ref 0.0–0.5)
Eosinophils Relative: 5 %
HCT: 44.6 % (ref 39.0–52.0)
Hemoglobin: 15.1 g/dL (ref 13.0–17.0)
Immature Granulocytes: 1 %
Lymphocytes Relative: 29 %
Lymphs Abs: 2.3 10*3/uL (ref 0.7–4.0)
MCH: 34.1 pg — ABNORMAL HIGH (ref 26.0–34.0)
MCHC: 33.9 g/dL (ref 30.0–36.0)
MCV: 100.7 fL — ABNORMAL HIGH (ref 80.0–100.0)
Monocytes Absolute: 0.8 10*3/uL (ref 0.1–1.0)
Monocytes Relative: 9 %
Neutro Abs: 4.5 10*3/uL (ref 1.7–7.7)
Neutrophils Relative %: 55 %
Platelets: 191 10*3/uL (ref 150–400)
RBC: 4.43 MIL/uL (ref 4.22–5.81)
RDW: 12.6 % (ref 11.5–15.5)
WBC: 8.1 10*3/uL (ref 4.0–10.5)
nRBC: 0 % (ref 0.0–0.2)

## 2022-11-02 LAB — HEPATIC FUNCTION PANEL
ALT: 73 U/L — ABNORMAL HIGH (ref 0–44)
AST: 96 U/L — ABNORMAL HIGH (ref 15–41)
Albumin: 3.7 g/dL (ref 3.5–5.0)
Alkaline Phosphatase: 111 U/L (ref 38–126)
Bilirubin, Direct: 0.5 mg/dL — ABNORMAL HIGH (ref 0.0–0.2)
Indirect Bilirubin: 0.7 mg/dL (ref 0.3–0.9)
Total Bilirubin: 1.2 mg/dL (ref 0.3–1.2)
Total Protein: 7.8 g/dL (ref 6.5–8.1)

## 2022-11-02 LAB — BASIC METABOLIC PANEL
Anion gap: 14 (ref 5–15)
BUN: 10 mg/dL (ref 6–20)
CO2: 21 mmol/L — ABNORMAL LOW (ref 22–32)
Calcium: 9.2 mg/dL (ref 8.9–10.3)
Chloride: 103 mmol/L (ref 98–111)
Creatinine, Ser: 0.8 mg/dL (ref 0.61–1.24)
GFR, Estimated: >60 mL/min (ref >60)
Glucose, Bld: 109 mg/dL — ABNORMAL HIGH (ref 70–99)
Potassium: 3.6 mmol/L (ref 3.5–5.1)
Sodium: 138 mmol/L (ref 135–145)

## 2022-11-02 LAB — BRAIN NATRIURETIC PEPTIDE: B Natriuretic Peptide: 32.1 pg/mL (ref 0.0–100.0)

## 2022-11-02 NOTE — ED Triage Notes (Signed)
Pt reports left leg swelling x 1 week. Pt states that his d dimer was elevated Friday.

## 2022-11-02 NOTE — ED Provider Notes (Signed)
[place] EMERGENCY DEPARTMENT AT [facility] Provider Note   CSN: [id number] Arrival date & time: 11/02/22  0008     History  Chief Complaint  Patient presents with   abnormal labs    Leg Swelling    Ryan Holland is a 52 y.o. male.  The history is provided by the patient and medical records.   52 year old male with hypertension, anxiety, sleep apnea, GERD, presenting to the ED for left leg swelling for the past week.  States has been unwell for about a month.  Saw PCP and had work-up including labs, CXR, CTA chest, and carotid dopplers.  No PE seen, however found to have PNA.  He finished course of azithromycin, now on augmentin.  He states tonight he went to take his socks off and left leg was extremely swollen causing concern.  Hx of cellulitis previously in this leg.  No prior hx of DVT.  No fever/chills.    Home Medications Prior to Admission medications   Medication Sig Start Date End Date Taking? Authorizing Provider  amoxicillin-clavulanate (AUGMENTIN) 875-125 MG tablet Take 1 tablet by mouth 2 (two) times daily. 10/30/22   Donato Schultz, DO  azithromycin (ZITHROMAX Z-PAK) 250 MG tablet As directed 10/30/22   Seabron Spates R, DO  brompheniramine-pseudoephedrine-DM 30-2-10 MG/5ML syrup Take 5 mLs by mouth 4 (four) times daily as needed. 08/22/22   Freddy Finner, NP  cetirizine (ZYRTEC) 10 MG tablet Take 10 mg by mouth 2 (two) times daily.     [provider]  citalopram (CELEXA) 40 MG tablet Take 1 tablet (40 mg total) by mouth daily. 10/26/22   Saguier, Ramon Dredge, [name]  fluticasone (FLONASE) 50 MCG/ACT nasal spray Place 2 sprays into both nostrils daily. 09/27/22   Saguier, Ramon Dredge, [name]  ipratropium (ATROVENT) 0.03 % nasal spray Place 2 sprays into both nostrils every 12 (twelve) hours. 08/22/22   Freddy Finner, NP  losartan (COZAAR) 100 MG tablet Take 1 tablet (100 mg total) by mouth daily. 10/26/22   Saguier, Ramon Dredge, [name]  methocarbamol  (ROBAXIN) 500 MG tablet Take 1 tablet (500 mg total) by mouth every 6 (six) hours as needed for muscle spasms. 08/01/22 08/01/23  Saguier, Ramon Dredge, [name]  montelukast (SINGULAIR) 10 MG tablet Take 1 tablet (10 mg total) by mouth daily. 02/20/22 02/20/23  Saguier, Ramon Dredge, [name]  pantoprazole (PROTONIX) 40 MG tablet Take 1 tablet (40 mg total) by mouth daily. 02/20/22 02/20/23  Saguier, Ramon Dredge, [name]  testosterone cypionate (DEPOTESTOSTERONE CYPIONATE) 200 MG/ML injection INJECT 1 ML (200 MG TOTAL) INTO THE MUSCLE EVERY 14 (FOURTEEN) DAYS. 05/07/20 11/03/20  Shade Flood, MD  trimethoprim-polymyxin b (POLYTRIM) ophthalmic solution Place 1 drop into both eyes in the morning, at noon, in the evening, and at bedtime. 08/22/22   Freddy Finner, NP  Vitamin D, Ergocalciferol, (DRISDOL) 1.25 MG (50000 UNIT) CAPS capsule Take 1 capsule (50,000 Units total) by mouth every 7 (seven) days. 02/20/22   Saguier, Ramon Dredge, [name]      Allergies    Other    Review of Systems   Review of Systems  Cardiovascular:  Positive for leg swelling.  All other systems reviewed and are negative.   Physical Exam Updated Vital Signs BP (!) 152/98   Pulse 81   Temp 98 F (36.7 C) (Oral)   Resp 18   SpO2 99%   Physical Exam Vitals and nursing note reviewed.  Constitutional:      Appearance: He is well-developed.  HENT:  Head: Normocephalic and atraumatic.  Eyes:     Conjunctiva/sclera: Conjunctivae normal.     Pupils: Pupils are equal, round, and reactive to light.  Cardiovascular:     Rate and Rhythm: Normal rate and regular rhythm.     Heart sounds: Normal heart sounds.  Pulmonary:     Effort: Pulmonary effort is normal.     Breath sounds: Normal breath sounds.  Abdominal:     General: Bowel sounds are normal.     Palpations: Abdomen is soft.  Musculoskeletal:        General: Normal range of motion.     Cervical back: Normal range of motion.     Comments: Left leg does have abrasion on the shin which  appears old, no surrounding erythema or induration present, there is trace edema around the ankles, no pitting, compartments are soft, leg NVI  Skin:    General: Skin is warm and dry.  Neurological:     Mental Status: He is alert and oriented to person, place, and time.     ED Results / Procedures / Treatments   Labs (all labs ordered are listed, but only abnormal results are displayed) Labs Reviewed  CBC WITH DIFFERENTIAL/PLATELET - Abnormal; Notable for the following components:      Result Value   MCV 100.7 (*)    MCH 34.1 (*)    All other components within normal limits  BASIC METABOLIC PANEL - Abnormal; Notable for the following components:   CO2 21 (*)    Glucose, Bld 109 (*)    All other components within normal limits  HEPATIC FUNCTION PANEL - Abnormal; Notable for the following components:   AST 96 (*)    ALT 73 (*)    Bilirubin, Direct 0.5 (*)    All other components within normal limits  BRAIN NATRIURETIC PEPTIDE    EKG None  Radiology No results found.  Procedures Procedures    Medications Ordered in ED Medications - No data to display  ED Course/ Medical Decision Making/ A&P                                 Medical Decision Making Amount and/or Complexity of Data Reviewed Labs: ordered. ECG/medicine tests: ordered and independent interpretation performed.   52 year old male presenting to the ED with left lower extremity swelling.  Has been undergoing testing with PCP over the past month as he has been feeling poorly.  Positive d-dimer 10/26/22, therefore had CTA 10/27/22 without PE but did have multifocal PNA.  He is currently on second antibiotic.  No chest pain or SOB currently.  Left leg does have some trace swelling around the ankle but no pitting.  Abrasion over mid-shin but this appears old.  No cellulitic changes surrounding.  Labs today without leukocytosis or electrolyte derangement.  LFTs are marginally elevated, similar to prior when compared.   BNP is normal.  He has already had CTA to rule out PE.  Consider DVT given elevated D-dimer.  Unfortunately, venous duplex study not available at this hour.  He will board in the ED overnight and have this done in the morning.  If this is reassuring, he will need to follow-up with his primary care doctor for ongoing management.  Care signed out to morning team to follow-up on venous duplex.  Final Clinical Impression(s) / ED Diagnoses Final diagnoses:  Left leg swelling    Rx / DC Orders ED  Discharge Orders     None         Oletha Blend 11/02/22 [id number]    Palumbo, April, MD 11/02/22 [phone]

## 2022-11-02 NOTE — Discharge Instructions (Addendum)
Ultrasound was negative for blood clot. Please follow-up with your primary care doctor. Return here for new concerns.

## 2022-11-02 NOTE — ED Provider Notes (Signed)
  Physical Exam  BP 120/73   Pulse 83   Temp 98 F (36.7 C) (Oral)   Resp 18   SpO2 93%   Physical Exam  Procedures  Procedures  ED Course / MDM    Medical Decision Making Amount and/or Complexity of Data Reviewed Labs: ordered.   Took over patient care at shift change.  Ultrasound came back negative for DVT.  Informed patient of the results.  He is stable and safe for discharge home.  Return precautions provided.   Maxwell Marion, [name] 11/02/22 [id number]    Pricilla Loveless, MD 11/09/22 [phone]

## 2022-11-02 NOTE — Progress Notes (Signed)
Left lower extremity venous duplex has been completed. Preliminary results can be found in CV Proc through chart review.  Results were given to Maxwell Marion PA.  11/02/22 9:52 AM Olen Cordial RVT

## 2022-11-10 ENCOUNTER — Encounter: Payer: Self-pay | Admitting: Medical

## 2022-11-10 ENCOUNTER — Ambulatory Visit (HOSPITAL_BASED_OUTPATIENT_CLINIC_OR_DEPARTMENT_OTHER)
Admission: RE | Admit: 2022-11-10 | Discharge: 2022-11-10 | Disposition: A | Discharge status: Home / Self Care | Payer: 59 | Source: Ambulatory Visit | Attending: Medical | Admitting: Medical

## 2022-11-10 ENCOUNTER — Ambulatory Visit (INDEPENDENT_AMBULATORY_CARE_PROVIDER_SITE_OTHER): Payer: 59 | Admitting: Medical

## 2022-11-10 ENCOUNTER — Other Ambulatory Visit (HOSPITAL_BASED_OUTPATIENT_CLINIC_OR_DEPARTMENT_OTHER): Payer: Self-pay

## 2022-11-10 VITALS — BP 130/80 | HR 90 | Temp 98.7°F | Resp 18 | Ht 70.0 in | Wt 304.0 lb

## 2022-11-10 DIAGNOSIS — Z8701 Personal history of pneumonia (recurrent): Secondary | ICD-10-CM | POA: Insufficient documentation

## 2022-11-10 DIAGNOSIS — R0609 Other forms of dyspnea: Secondary | ICD-10-CM | POA: Diagnosis not present

## 2022-11-10 DIAGNOSIS — R000 Tachycardia, unspecified: Secondary | ICD-10-CM | POA: Diagnosis not present

## 2022-11-10 DIAGNOSIS — R059 Cough, unspecified: Secondary | ICD-10-CM

## 2022-11-10 DIAGNOSIS — J189 Pneumonia, unspecified organism: Secondary | ICD-10-CM | POA: Diagnosis not present

## 2022-11-10 DIAGNOSIS — R42 Dizziness and giddiness: Secondary | ICD-10-CM

## 2022-11-10 DIAGNOSIS — I10 Essential (primary) hypertension: Secondary | ICD-10-CM | POA: Diagnosis not present

## 2022-11-10 DIAGNOSIS — Z87891 Personal history of nicotine dependence: Secondary | ICD-10-CM

## 2022-11-10 DIAGNOSIS — R053 Chronic cough: Secondary | ICD-10-CM | POA: Diagnosis not present

## 2022-11-10 LAB — CBC WITH DIFFERENTIAL/PLATELET
Basophils Absolute: 0.1 10*3/uL (ref 0.0–0.1)
Basophils Relative: 1.1 % (ref 0.0–3.0)
Eosinophils Absolute: 0.4 10*3/uL (ref 0.0–0.7)
Eosinophils Relative: 7.7 % — ABNORMAL HIGH (ref 0.0–5.0)
HCT: 44.4 % (ref 39.0–52.0)
Hemoglobin: 15.1 g/dL (ref 13.0–17.0)
Lymphocytes Relative: 19 % (ref 12.0–46.0)
Lymphs Abs: 1 10*3/uL (ref 0.7–4.0)
MCHC: 34 g/dL (ref 30.0–36.0)
MCV: 101.6 fl — ABNORMAL HIGH (ref 78.0–100.0)
Monocytes Absolute: 0.5 10*3/uL (ref 0.1–1.0)
Monocytes Relative: 10.4 % (ref 3.0–12.0)
Neutro Abs: 3.2 10*3/uL (ref 1.4–7.7)
Neutrophils Relative %: 61.8 % (ref 43.0–77.0)
Platelets: 166 10*3/uL (ref 150.0–400.0)
RBC: 4.37 Mil/uL (ref 4.22–5.81)
RDW: 13.9 % (ref 11.5–15.5)
WBC: 5.1 10*3/uL (ref 4.0–10.5)

## 2022-11-10 LAB — BRAIN NATRIURETIC PEPTIDE: Pro B Natriuretic peptide (BNP): 17 pg/mL (ref 0.0–100.0)

## 2022-11-10 MED ORDER — BENZONATATE 100 MG PO CAPS
100.0000 mg | ORAL_CAPSULE | Freq: Three times a day (TID) | ORAL | 0 refills | Status: DC | PRN
  Filled 2022-11-10: qty 30, 10d supply, fill #0

## 2022-11-10 NOTE — Patient Instructions (Addendum)
Tachycardia Persistent high pulse rate at home but not in office. History of pneumonia and recent leg swelling. No significant stenosis noted on carotid ultrasound ordered by pcp about 2 weeks ago.. -Order CBC, chest x-ray, BNP, and repeat D-dimer to assess for ongoing infection or other causes of tachycardia. -If no evidence of ongoing pneumonia, refer to cardiology for Zio patch or Holter monitor to evaluate heart rate over a prolonged period. -Advise patient to monitor oxygen saturation and heart rate at home.  Pneumonia(hx of) Recent history of pneumonia, now with return of productive cough. Previously treated with Augmentin and Z-Pak. -Order chest x-ray and CBC to assess for ongoing pneumonia. -If pneumonia persists, consider change in antibiotic regimen. -Prescribe benzonatate for cough relief.  Shortness of Breath History of smoking, recent pneumonia, and tachycardia. Previously tried [employer] inhaler with no perceived benefit after just 2 days use. -bnp and d dimer. if d dimer elevated will need to repeat ct chest. -Continue Breo inhaler at 200-25 dose, one inhalation daily for at least a week. -Assess response to [facility] after lab results and potential adjustments to treatment plan.   htn- bp controlled on recheck.   rash left lower ext- dry pink area about 1 cm in diameter pretibial area. Advise moisturizer daily. does not appear infected. If signs/symptoms change indicating infection let me know.  After 5 PM was reviewing labs and saw patient's chest x-ray CBC and BMP.  Those were ordered stat earlier today.  However BNP was not done yet.  On investigation the study was ordered stat but it appears inadvertently marked as future.  I called over to [facility] and investigated and from what they could see it was not being run.  Since more than 8 hours of past it appears blood was not drawn as it was not being run/no record that was received.  I talked with [facility] to see if labs were open on  Saturday or Sunday and they said no labs in the area were open over the weekend.  Considered other labs such as [facility] but after talkng with Dr. Marin Shutter discovered that lab was not open over the weekend.  So we discussed options and decided to advise patient if he has worsening signs or symptoms be seen at the urgent care or emergency department over the weekend for D-dimer or other studies as indicated.  Will have staff call patient on Monday morning to have him get the D-dimer study if not done over the weekend.  I discussed with the patient signs and symptoms that I think with indicate/necessitate him being seen over the weekend.  Also discussed with his wife regarding his smoking history as well as secondhand smoke exposure history.  Explained that with his intermittent shortness of breath and smoking history would place referral to pulmonologist to decide when to get CT scan/screening protocol and to give opinion on possible pulmonary function test.  I apologized to the patient for test not being run as planned.

## 2022-11-10 NOTE — Progress Notes (Signed)
Subjective:    Patient ID: Ryan Holland, male    DOB: January 29, 1971, 52 y.o.   MRN: [id number]  HPI Discussed the use of AI scribe software for clinical note transcription with the patient, who gave verbal consent to proceed.  History of Present Illness   The patient, with a history of pneumonia, presented with a persistently high resting pulse rate of around 120 bpm for the past three weeks. This was associated with changes in vision, particularly blurring. The patient reported that these symptoms were exacerbated by physical activity, even as mild as walking, with pulse rates reaching up to 146 bpm. The patient also reported a recent recurrence of productive cough, which had initially improved after a course of antibiotics for pneumonia.  In addition to these symptoms, the patient experienced episodes of dizziness, particularly upon sudden movements such as standing up or turning the head. These episodes were brief, lasting only a few seconds, and were accompanied by a sensation of pressure in the frontal region of the head. The patient likened this sensation to the dizziness experienced when missing doses of his citalopram medication.  The patient also reported an episode of significant left leg swelling, which prompted an emergency department visit. A CT scan ruled out a clot, but the cause of the swelling was not definitively identified. The patient was prescribed Augmentin and a Z-Pak, of which he still had some remaining.  The patient's current medication regimen includes citalopram, aspirin, and the aforementioned antibiotics. He has been monitoring his pulse and oxygen saturation at home, noting that his pulse consistently stays above 110 bpm. but ws low entire time in office.       Review of Systems  Constitutional:  Negative for chills, fatigue and fever.  HENT:  Negative for congestion, ear pain and hearing loss.   Respiratory:  Positive for cough and shortness of breath. Negative  for chest tightness and wheezing.   Cardiovascular:  Negative for chest pain and palpitations.  Gastrointestinal:  Negative for abdominal pain, blood in stool, diarrhea and vomiting.  Genitourinary:  Negative for dysuria.  Musculoskeletal:  Negative for back pain, myalgias and neck stiffness.  Skin:  Negative for rash.  Neurological:        See hpi.  Psychiatric/Behavioral:  Negative for behavioral problems, decreased concentration and dysphoric mood.     Past Medical History:  Diagnosis Date   Anxiety    GERD (gastroesophageal reflux disease)    OCCAS-WOULD USE OTC MED IF NEEDED   HNP (herniated nucleus pulposus), lumbar    PAIN IN LOWER BACK AND PAIN DOWN BOTH LEGS -12-24-14 radiates down right leg presently.   Hypertension    Sleep apnea    HAS CPAP-DOES NOT USE-DOES NOT KNOW SETTINGS     Social History   Socioeconomic History   Marital status: Married    Spouse name: Not on file   Number of children: Not on file   Years of education: Not on file   Highest education level: Not on file  Occupational History   Not on file  Tobacco Use   Smoking status: Former    Current packs/day: 0.00    Average packs/day: 2.0 packs/day for 20.0 years (40.0 ttl pk-yrs)    Types: Cigarettes    Start date: 10/05/1991    Quit date: 10/05/2011    Years since quitting: 11.1   Smokeless tobacco: Former    Types: Chew  Substance and Sexual Activity   Alcohol use: Yes    Alcohol/week:  0.0 standard drinks of alcohol    Comment: 2 MIXED DRINKS (VODKA) DAILY-usually    Drug use: No   Sexual activity: Not on file  Other Topics Concern   Not on file  Social History Narrative   Not on file   Social Determinants of Health   Financial Resource Strain: Not on file  Food Insecurity: Not on file  Transportation Needs: Not on file  Physical Activity: Not on file  Stress: Not on file  Social Connections: Unknown (07/17/2021)   Received from [facility]   Social Network     Social Network: Not on file  Intimate Partner Violence: Unknown (06/08/2021)   Received from [facility], [facility]   HITS    Physically Hurt: Not on file    Insult or Talk Down To: Not on file    Threaten Physical Harm: Not on file    Scream or Curse: Not on file    Past Surgical History:  Procedure Laterality Date   01/05/2011 LUMBAR DECOMPRESSION AND MICRODISCECTOMY     CERVICAL FUSION  C4-5      ESOPHAGOGASTRODUODENOSCOPY (EGD) WITH PROPOFOL N/A 12/30/2014   Procedure: ESOPHAGOGASTRODUODENOSCOPY (EGD) WITH PROPOFOL;  Surgeon: Willis Modena, MD;  Location: [facility];  Service: Endoscopy;  Laterality: N/A;  With Bravo   LUMBAR LAMINECTOMY/DECOMPRESSION MICRODISCECTOMY  07/26/2011   Procedure: LUMBAR LAMINECTOMY/DECOMPRESSION MICRODISCECTOMY;  Surgeon: Javier Docker, MD;  Location: [facility];  Service: Orthopedics;  Laterality: Right;  Re-Do Lumbar Decompression L5-S1 on Right,    PILONIDAL CYST EXCISION      Family History  Problem Relation Age of Onset   COPD Father    Congestive Heart Failure Father    Breast cancer Mother     Allergies  Allergen Reactions   Other     LEMON-sores in mouth    Current Outpatient Medications on File Prior to Visit  Medication Sig Dispense Refill   amoxicillin-clavulanate (AUGMENTIN) 875-125 MG tablet Take 1 tablet by mouth 2 (two) times daily. 20 tablet 0   azithromycin (ZITHROMAX Z-PAK) 250 MG tablet As directed (Patient not taking: Reported on 11/02/2022) 6 each 0   cetirizine (ZYRTEC) 10 MG tablet Take 20 mg by mouth daily.     citalopram (CELEXA) 40 MG tablet Take 1 tablet (40 mg total) by mouth daily. 90 tablet 0   fluticasone (FLONASE) 50 MCG/ACT nasal spray Place 2 sprays into both nostrils daily. 16 g 0   ipratropium (ATROVENT) 0.03 % nasal spray Place 2 sprays into both nostrils every 12 (twelve) hours. (Patient not taking: Reported on 11/02/2022) 30 mL 0   losartan (COZAAR) 100 MG tablet Take 1 tablet (100 mg total) by mouth  daily. 90 tablet 0   methocarbamol (ROBAXIN) 500 MG tablet Take 1 tablet (500 mg total) by mouth every 6 (six) hours as needed for muscle spasms. 90 tablet 0   montelukast (SINGULAIR) 10 MG tablet Take 1 tablet (10 mg total) by mouth daily. 90 tablet 3   pantoprazole (PROTONIX) 40 MG tablet Take 1 tablet (40 mg total) by mouth daily. 90 tablet 3   testosterone cypionate (DEPOTESTOSTERONE CYPIONATE) 200 MG/ML injection INJECT 1 ML (200 MG TOTAL) INTO THE MUSCLE EVERY 14 (FOURTEEN) DAYS. 6 mL 1   trimethoprim-polymyxin b (POLYTRIM) ophthalmic solution Place 1 drop into both eyes in the morning, at noon, in the evening, and at bedtime. 10 mL 0   Vitamin D, Ergocalciferol, (DRISDOL) 1.25 MG (50000 UNIT) CAPS capsule Take 1 capsule (50,000 Units  total) by mouth every 7 (seven) days. 8 capsule 0   No current facility-administered medications on file prior to visit.    BP 130/80   Pulse 90   Temp 98.7 F (37.1 C)   Resp 18   Ht 5\' 10"  (1.778 m)   Wt (!) 304 lb (137.9 kg)   SpO2 97%   BMI 43.62 kg/m        Objective:   Physical Exam  General Mental Status- Alert. General Appearance- Not in acute distress.   Skin General: Color- Normal Color. Moisture- Normal Moisture.  Neck Carotid Arteries- Normal color. Moisture- Normal Moisture. No carotid bruits. No JVD.  Chest and Lung Exam Auscultation: Breath Sounds:-Normal.  Cardiovascular Auscultation:Rythm- Regular. Murmurs & Other Heart Sounds:Auscultation of the heart reveals- No Murmurs.  Abdomen Inspection:-Inspeection Normal. Palpation/Percussion:Note:No mass. Palpation and Percussion of the abdomen reveal- Non Tender, Non Distended + BS, no rebound or guarding.   Neurologic Cranial Nerve exam:- CN III-XII intact(No nystagmus), symmetric smile. Strength:- 5/5 equal and symmetric strength both upper and lower extremities.   Lower ext- no pedal edema. Calf symmetric. Negative homans sign. Left dise dry pink area about 1 cm in  diameter pretibial area. Not warm or tender.    Assessment & Plan:  Assessment and Plan    Tachycardia Persistent high pulse rate at home but not in office. History of pneumonia and recent leg swelling. No significant stenosis noted on carotid ultrasound ordered by pcp about 2 weeks ago.. -Order CBC, chest x-ray, BNP, and repeat D-dimer to assess for ongoing infection or other causes of tachycardia. -If no evidence of ongoing pneumonia, refer to cardiology for Zio patch or Holter monitor to evaluate heart rate over a prolonged period. -Advise patient to monitor oxygen saturation and heart rate at home.  Pneumonia Recent history of pneumonia, now with return of productive cough. Previously treated with Augmentin and Z-Pak. -Order chest x-ray and CBC to assess for ongoing pneumonia. -If pneumonia persists, consider change in antibiotic regimen. -Prescribe benzonatate for cough relief.  Shortness of Breath History of smoking, recent pneumonia, and tachycardia. Previously tried [employer] inhaler with no perceived benefit after just 2 days use. -bnp and d dimer. if d dimer elevated will need to repeat ct chest. -Continue Breo inhaler at 200-25 dose, one inhalation daily for at least a week. -Assess response to [facility] after lab results and potential adjustments to treatment plan.   htn- bp controlled on recheck.   rash left lower ext- dry pink area about 1 cm in diameter. Advise moisturizer daily. does not appear infected. If signs/symptoms change indicating infection let me know.       Follow up one week or sooner based on labs and imaging results.   Esperanza Richters, [name]   Time spent with patient today was 43  minutes which consisted of chart revdiew, discussing diagnosis, work up treatment and documentation. Time did not include time to do EKG. [phone]

## 2022-11-13 ENCOUNTER — Other Ambulatory Visit: Payer: Self-pay | Admitting: Medical

## 2022-11-13 ENCOUNTER — Other Ambulatory Visit: Payer: 59

## 2022-11-13 DIAGNOSIS — G8929 Other chronic pain: Secondary | ICD-10-CM

## 2022-11-13 DIAGNOSIS — R0609 Other forms of dyspnea: Secondary | ICD-10-CM

## 2022-11-13 LAB — D-DIMER, QUANTITATIVE: D-Dimer, Quant: 0.39 ug{FEU}/mL (ref <0.50)

## 2022-11-13 MED ORDER — METHOCARBAMOL 500 MG PO TABS
500.0000 mg | ORAL_TABLET | Freq: Four times a day (QID) | ORAL | 0 refills | Status: DC | PRN
Start: 2022-11-13 — End: 2023-02-24
  Filled 2022-11-13: qty 90, 23d supply, fill #0

## 2022-11-13 NOTE — Addendum Note (Signed)
Addended by: Gwenevere Abbot on: 11/13/2022 01:13 PM   Modules accepted: Orders

## 2022-11-13 NOTE — Telephone Encounter (Signed)
Let pt know that his d dimer came back 17. Not sure why it was delayed so much. But the study was normal. How is he doing. Apologize for the delay. How is he feeling now. Any random tachcardia events or shortness of breath. With ct chest negative about 2 weeks ago and d dimer negative won't repeat ct. But will refer to both cardiology and pulmonologist as discussed with pt and his wife on Friday eventing.

## 2022-11-13 NOTE — Addendum Note (Signed)
Addended by: Mervin Kung A on: 11/13/2022 01:50 PM   Modules accepted: Orders

## 2022-11-13 NOTE — Progress Notes (Signed)
No charge. Test not ordered at previous visit.

## 2022-11-13 NOTE — Telephone Encounter (Signed)
Rx robaxin refill sent to pharmacy.

## 2022-11-13 NOTE — Telephone Encounter (Signed)
Correction his d dimer was not done. Please apologize for me stating was done earlier today. Was not done.

## 2022-11-14 ENCOUNTER — Other Ambulatory Visit (HOSPITAL_COMMUNITY): Payer: Self-pay

## 2022-11-14 ENCOUNTER — Other Ambulatory Visit: Payer: Self-pay

## 2022-11-20 ENCOUNTER — Ambulatory Visit: Payer: 59 | Admitting: Internal Medicine

## 2022-11-20 ENCOUNTER — Encounter: Payer: Self-pay | Admitting: Internal Medicine

## 2022-11-20 ENCOUNTER — Other Ambulatory Visit: Payer: 59

## 2022-11-20 ENCOUNTER — Other Ambulatory Visit (HOSPITAL_BASED_OUTPATIENT_CLINIC_OR_DEPARTMENT_OTHER): Payer: Self-pay

## 2022-11-20 VITALS — BP 144/76 | HR 90 | Temp 98.3°F | Ht 70.0 in | Wt 297.0 lb

## 2022-11-20 DIAGNOSIS — D721 Eosinophilia, unspecified: Secondary | ICD-10-CM | POA: Diagnosis not present

## 2022-11-20 DIAGNOSIS — Z8669 Personal history of other diseases of the nervous system and sense organs: Secondary | ICD-10-CM

## 2022-11-20 DIAGNOSIS — Z889 Allergy status to unspecified drugs, medicaments and biological substances status: Secondary | ICD-10-CM | POA: Diagnosis not present

## 2022-11-20 DIAGNOSIS — R918 Other nonspecific abnormal finding of lung field: Secondary | ICD-10-CM

## 2022-11-20 DIAGNOSIS — R053 Chronic cough: Secondary | ICD-10-CM | POA: Diagnosis not present

## 2022-11-20 DIAGNOSIS — Z23 Encounter for immunization: Secondary | ICD-10-CM

## 2022-11-20 DIAGNOSIS — R42 Dizziness and giddiness: Secondary | ICD-10-CM

## 2022-11-20 DIAGNOSIS — R0609 Other forms of dyspnea: Secondary | ICD-10-CM

## 2022-11-20 LAB — NITRIC OXIDE: Nitric Oxide: 15

## 2022-11-20 NOTE — Progress Notes (Signed)
OV 11/20/2022  Subjective:-History from the patient and also review of the external medical record.  Patient ID: Ryan Holland, male , DOB: 1970/03/13 , age 52 y.o. , MRN: [id number] , ADDRESS: [address] [place] [id number] PCP Saguier, Kateri Mc Patient Care Team: Marisue Brooklyn as PCP - General (Internal Medicine)  This Provider for this visit: Treatment Team:  Attending Provider: Kalman Shan, MD    11/20/2022 -   Chief Complaint  Patient presents with   Pulmonary Consult    Referred by Esperanza Richters, PA. Pt states "have had allergies forever"- increased SOB since July 2024- gets winded sometimes just at rest and with minimal exertion. He has had cough- occ prod with clear sputum.      HPI Ryan Holland 52 y.o. -works in [employer].  Following 40 pack smoking history but quit.  He tells me that he has a history of chronic cough for many years.  This is diagnosed secondary to allergies.  He used to follow with "[facility]" at [facility].  Review extramedical records I am not able to find any allergy testing but he states allergy testing was positive.  It appears several years ago he had autoimmune profile the results are not available but I am presuming it is negative.  He was never on any asthma inhalers.  Since July 2024 he had insidious onset of shortness of breath associated with dizziness.  Dizziness happens randomly.  Shortness of breath is present with exertion relieved by rest.  After he went on Trelegy the shortness of breath did get better.  His baseline cough started going up.  Now for the last 1 or 2 weeks he has a dull pain in his right breast area.  He also has noticed some random tingling once or twice on his right arm.  The symptoms are associated with tachycardia particularly the dizziness.  He says even at rest he has tachycardia.  Sometimes the heart rate even at rest is 100-120 [90-99/min today at rest].   Review of the  records indicate that as of June 2024 he was doing okay.  Then on October 26, 2022 he did see primary care office.  For episodes of vertigo.  This is what he says the dizziness.  He points to the sinus area for this.  He was also having hypertension.  He did report his chronic cough.  At this visit he said the [facility] was not helping but today he did tell me the [facility] did help.  This subsequently resulted in a CT angiogram chest to rule out pulmonary embolism but did show a right sided groundglass opacities that I personally visualized and confirmed.  He also had Doppler lower extremities and this was negative.  He was given antibiotics both azithromycin and Z-Pak.  Then on Labor Day weekend he was in the [employer] outlet at [place].  There when he went outside he got very tachycardic to a heart rate of 145 and also had blurred vision.  He did fall follow-up after this with primary care.  He is due dimer and BNP were normal.  Labs indicate that today exam nitric oxide is normal [possible Breo effect].  His blood eosinophils are consistently high at 400 cells per cubic millimeter.  Primary care is considered but getting a referral to cardiology for Zio patch.  But he is also been referred here.  CT chest in 2016 was normal.  No ACE inhibitor intake.    X-rays  hypoxemia test sit/stand pulse ox at rest was 99% with a heart rate 90-99.  After sitting and standing 10 times he was short of breath 5 out of 10 pulse ox never changed heart rate went up to 110.  He does have sleep apnea he uses his CPAP but has not seen a sleep doctor in many years.   CT Chest data from date: 10/27/22  - personally visualized and independently interpreted : YES - my findings are: AGREE rrative & Impression  CLINICAL DATA:  Shortness of breath with positive D-dimer   EXAM: CT ANGIOGRAPHY CHEST WITH CONTRAST   TECHNIQUE: Multidetector CT imaging of the chest was performed using the standard protocol during bolus administration of  intravenous contrast. Multiplanar CT image reconstructions and MIPs were obtained to evaluate the vascular anatomy.   RADIATION DOSE REDUCTION: This exam was performed according to the departmental dose-optimization program which includes automated exposure control, adjustment of the mA and/or kV according to patient size and/or use of iterative reconstruction technique.   CONTRAST:  OMNIPAQUE IOHEXOL 350 MG/ML SOLN   COMPARISON:  None Available.   FINDINGS: Cardiovascular: No evidence of pulmonary embolus. Normal heart size. No pericardial effusion. Normal caliber thoracic aorta with mild atherosclerotic disease. No coronary artery calcifications.   Mediastinum/Nodes: Esophagus thyroid are unremarkable. No enlarged lymph nodes seen in the chest.   Lungs/Pleura: Central airways are patent. Patchy ground-glass opacities which are most pronounced in the right middle lobe and right upper lobe. No consolidation, pleural effusion or pneumothorax. Small solid pulmonary nodule of the right lower lobe measuring 4 mm on series 301, image 78.   Upper Abdomen: Hepatic steatosis.  No acute abnormality.   Musculoskeletal: No aggressive appearing osseous lesions.   Review of the MIP images confirms the above findings.   IMPRESSION: 1. No evidence of pulmonary embolus. 2. Patchy ground-glass opacities which are most pronounced in the right middle lobe and right upper lobe, likely infectious or inflammatory. 3. Small solid pulmonary nodule of the right lower lobe measuring 4 mm. No follow-up needed if patient is low-risk.This recommendation follows the consensus statement: Guidelines for Management of Incidental Pulmonary Nodules Detected on CT Images: From the Fleischner Society 2017; Radiology 2017; 284:228-243. 4. Hepatic steatosis. 5. Mild aortic Atherosclerosis (ICD10-I70.0).     Electronically Signed   By: Allegra Lai M.D.   On: 10/27/2022 19:41    PFT    Latest  Ref Rng & Units 09/10/2014    9:39 AM  PFT Results  FVC-Pre L 4.20   FVC-Predicted Pre % 80   FVC-Post L 3.94   FVC-Predicted Post % 75   Pre FEV1/FVC % % 85   Post FEV1/FCV % % 85   FEV1-Pre L 3.57   FEV1-Predicted Pre % 85   FEV1-Post L 3.37   DLCO uncorrected ml/min/mmHg 27.88   DLCO UNC% % 86   DLVA Predicted % 109   TLC L 5.84   TLC % Predicted % 84   RV % Predicted % 87        LAB RESULTS last 96 hours No results found.  LAB RESULTS last 90 days Recent Results (from the past 2160 hour(s))  CBC with Differential/Platelet     Status: Abnormal   Collection Time: 10/26/22  3:45 PM  Result Value Ref Range   WBC 8.3 4.0 - 10.5 K/uL   RBC 4.31 4.22 - 5.81 Mil/uL   Hemoglobin 15.1 13.0 - 17.0 g/dL   HCT 78.2 95.6 - 21.3 %  MCV 101.6 (H) 78.0 - 100.0 fl   MCHC 34.4 30.0 - 36.0 g/dL   RDW 21.3 08.6 - 57.8 %   Platelets 247.0 150.0 - 400.0 K/uL   Neutrophils Relative % 69.4 43.0 - 77.0 %   Lymphocytes Relative 17.5 12.0 - 46.0 %   Monocytes Relative 9.7 3.0 - 12.0 %   Eosinophils Relative 2.7 0.0 - 5.0 %   Basophils Relative 0.7 0.0 - 3.0 %   Neutro Abs 5.8 1.4 - 7.7 K/uL   Lymphs Abs 1.5 0.7 - 4.0 K/uL   Monocytes Absolute 0.8 0.1 - 1.0 K/uL   Eosinophils Absolute 0.2 0.0 - 0.7 K/uL   Basophils Absolute 0.1 0.0 - 0.1 K/uL  Comprehensive metabolic panel     Status: Abnormal   Collection Time: 10/26/22  3:45 PM  Result Value Ref Range   Sodium 136 135 - 145 mEq/L   Potassium 4.1 3.5 - 5.1 mEq/L   Chloride 102 96 - 112 mEq/L   CO2 25 19 - 32 mEq/L   Glucose, Bld 123 (H) 70 - 99 mg/dL   BUN 14 6 - 23 mg/dL   Creatinine, Ser 4.69 0.40 - 1.50 mg/dL   Total Bilirubin 1.2 0.2 - 1.2 mg/dL   Alkaline Phosphatase 133 (H) 39 - 117 U/L   AST 97 (H) 0 - 37 U/L   ALT 68 (H) 0 - 53 U/L   Total Protein 7.3 6.0 - 8.3 g/dL   Albumin 3.8 3.5 - 5.2 g/dL   GFR 62.95 >28.41 mL/min    Comment: Calculated using the CKD-EPI Creatinine Equation (2021)   Calcium 9.2 8.4 - 10.5 mg/dL   Lipid panel     Status: Abnormal   Collection Time: 10/26/22  3:45 PM  Result Value Ref Range   Cholesterol 274 (H) 0 - 200 mg/dL    Comment: ATP III Classification       Desirable:  < 200 mg/dL               Borderline High:  200 - 239 mg/dL          High:  > = 324 mg/dL   Triglycerides 401.0 (H) 0.0 - 149.0 mg/dL    Comment: Normal:  <272 mg/dLBorderline High:  150 - 199 mg/dL   HDL 53.66 >44.03 mg/dL   VLDL 47.4 (H) 0.0 - 25.9 mg/dL   Total CHOL/HDL Ratio 5     Comment:                Men          Women1/2 Average Risk     3.4          3.3Average Risk          5.0          4.42X Average Risk          9.6          7.13X Average Risk          15.0          11.0                       NonHDL 214.73     Comment: NOTE:  Non-HDL goal should be 30 mg/dL higher than patient's LDL goal (i.e. LDL goal of < 70 mg/dL, would have non-HDL goal of < 100 mg/dL)  TSH     Status: None   Collection Time: 10/26/22  3:45 PM  Result Value Ref Range   TSH 1.13 0.35 - 5.50 uIU/mL  D-Dimer, Quantitative     Status: Abnormal   Collection Time: 10/26/22  3:45 PM  Result Value Ref Range   D-Dimer, Quant 0.60 (H) <0.50 mcg/mL FEU    Comment: . The D-Dimer test is used frequently to exclude an acute PE or DVT. In patients with a low to moderate clinical risk assessment and a D-Dimer result <0.50 mcg/mL FEU, the likelihood of a PE or DVT is very low. However, a thromboembolic event should not be excluded solely on the basis of the D-Dimer level. Increased levels of D-Dimer are associated with a PE, DVT, DIC, malignancies, inflammation, sepsis, surgery, trauma, pregnancy, and advancing patient age. [Jama 2006 11:295(2):199-207] . For additional information, please refer to: http://education.questdiagnostics.com/faq/FAQ149 (This link is being provided for informational/ educational purposes only) .   LDL cholesterol, direct     Status: None   Collection Time: 10/26/22  3:45 PM  Result Value Ref Range    Direct LDL 189.0 mg/dL    Comment: Optimal:  <193 mg/dLNear or Above Optimal:  100-129 mg/dLBorderline High:  130-159 mg/dLHigh:  160-189 mg/dLVery High:  >190 mg/dL  CBC with Differential     Status: Abnormal   Collection Time: 11/02/22  2:23 AM  Result Value Ref Range   WBC 8.1 4.0 - 10.5 K/uL   RBC 4.43 4.22 - 5.81 MIL/uL   Hemoglobin 15.1 13.0 - 17.0 g/dL   HCT 79.0 24.0 - 97.3 %   MCV 100.7 (H) 80.0 - 100.0 fL   MCH 34.1 (H) 26.0 - 34.0 pg   MCHC 33.9 30.0 - 36.0 g/dL   RDW 53.2 99.2 - 42.6 %   Platelets 191 150 - 400 K/uL   nRBC 0.0 0.0 - 0.2 %   Neutrophils Relative % 55 %   Neutro Abs 4.5 1.7 - 7.7 K/uL   Lymphocytes Relative 29 %   Lymphs Abs 2.3 0.7 - 4.0 K/uL   Monocytes Relative 9 %   Monocytes Absolute 0.8 0.1 - 1.0 K/uL   Eosinophils Relative 5 %   Eosinophils Absolute 0.4 0.0 - 0.5 K/uL   Basophils Relative 1 %   Basophils Absolute 0.1 0.0 - 0.1 K/uL   Immature Granulocytes 1 %   Abs Immature Granulocytes 0.06 0.00 - 0.07 K/uL    Comment: Performed at [facility], [address], [place], [place] [address]  Basic metabolic panel     Status: Abnormal   Collection Time: 11/02/22  2:23 AM  Result Value Ref Range   Sodium 138 135 - 145 mmol/L   Potassium 3.6 3.5 - 5.1 mmol/L   Chloride 103 98 - 111 mmol/L   CO2 21 (L) 22 - 32 mmol/L   Glucose, Bld 109 (H) 70 - 99 mg/dL    Comment: Glucose reference range applies only to samples taken after fasting for at least 8 hours.   BUN 10 6 - 20 mg/dL   Creatinine, Ser 6.22 0.61 - 1.24 mg/dL   Calcium 9.2 8.9 - 29.7 mg/dL   GFR, Estimated >98 >92 mL/min    Comment: (NOTE) Calculated using the CKD-EPI Creatinine Equation (2021)    Anion gap 14 5 - 15    Comment: Performed at [facility], [address] [place], [place] [address]  Brain natriuretic peptide     Status: None   Collection Time: 11/02/22  2:23 AM  Result Value Ref Range   B Natriuretic Peptide  32.1 0.0 - 100.0 pg/mL     Comment: Performed at [facility], [address] [place], [place] [address]  Hepatic function panel     Status: Abnormal   Collection Time: 11/02/22  2:23 AM  Result Value Ref Range   Total Protein 7.8 6.5 - 8.1 g/dL   Albumin 3.7 3.5 - 5.0 g/dL   AST 96 (H) 15 - 41 U/L   ALT 73 (H) 0 - 44 U/L   Alkaline Phosphatase 111 38 - 126 U/L   Total Bilirubin 1.2 0.3 - 1.2 mg/dL   Bilirubin, Direct 0.5 (H) 0.0 - 0.2 mg/dL   Indirect Bilirubin 0.7 0.3 - 0.9 mg/dL    Comment: Performed at [facility], [address] [place], [place] [address]  CBC w/Diff     Status: Abnormal   Collection Time: 11/10/22  9:21 AM  Result Value Ref Range   WBC 5.1 4.0 - 10.5 K/uL   RBC 4.37 4.22 - 5.81 Mil/uL   Hemoglobin 15.1 13.0 - 17.0 g/dL   HCT 42.5 95.6 - 38.7 %   MCV 101.6 (H) 78.0 - 100.0 fl   MCHC 34.0 30.0 - 36.0 g/dL   RDW 56.4 33.2 - 95.1 %   Platelets 166.0 150.0 - 400.0 K/uL   Neutrophils Relative % 61.8 43.0 - 77.0 %   Lymphocytes Relative 19.0 12.0 - 46.0 %   Monocytes Relative 10.4 3.0 - 12.0 %   Eosinophils Relative 7.7 (H) 0.0 - 5.0 %   Basophils Relative 1.1 0.0 - 3.0 %   Neutro Abs 3.2 1.4 - 7.7 K/uL   Lymphs Abs 1.0 0.7 - 4.0 K/uL   Monocytes Absolute 0.5 0.1 - 1.0 K/uL   Eosinophils Absolute 0.4 0.0 - 0.7 K/uL   Basophils Absolute 0.1 0.0 - 0.1 K/uL  B Nat Peptide     Status: None   Collection Time: 11/10/22  9:21 AM  Result Value Ref Range   Pro B Natriuretic peptide (BNP) 17.0 0.0 - 100.0 pg/mL  D-Dimer, Quantitative     Status: None   Collection Time: 11/13/22  1:50 PM  Result Value Ref Range   D-Dimer, Quant 0.39 <0.50 mcg/mL FEU    Comment: . The D-Dimer test is used frequently to exclude an acute PE or DVT. In patients with a low to moderate clinical risk assessment and a D-Dimer result <0.50 mcg/mL FEU, the likelihood of a PE or DVT is very low. However, a thromboembolic event should not be excluded solely on the basis of  the D-Dimer level. Increased levels of D-Dimer are associated with a PE, DVT, DIC, malignancies, inflammation, sepsis, surgery, trauma, pregnancy, and advancing patient age. [Jama 2006 11:295(2):199-207] . For additional information, please refer to: http://education.questdiagnostics.com/faq/FAQ149 (This link is being provided for informational/ educational purposes only) .          has a past medical history of Anxiety, GERD (gastroesophageal reflux disease), HNP (herniated nucleus pulposus), lumbar, Hypertension, and Sleep apnea.   reports that he quit smoking about 11 years ago. His smoking use included cigarettes. He started smoking about 31 years ago. He has a 40 pack-year smoking history. He has quit using smokeless tobacco.  His smokeless tobacco use included chew.  Past Surgical History:  Procedure Laterality Date   01/05/2011 LUMBAR DECOMPRESSION AND MICRODISCECTOMY     CERVICAL FUSION  C4-5      ESOPHAGOGASTRODUODENOSCOPY (EGD) WITH PROPOFOL N/A 12/30/2014   Procedure: ESOPHAGOGASTRODUODENOSCOPY (EGD) WITH PROPOFOL;  Surgeon: Chrissie Noa  Dulce Sellar, MD;  Location: [facility];  Service: Endoscopy;  Laterality: N/A;  With Bravo   LUMBAR LAMINECTOMY/DECOMPRESSION MICRODISCECTOMY  07/26/2011   Procedure: LUMBAR LAMINECTOMY/DECOMPRESSION MICRODISCECTOMY;  Surgeon: Javier Docker, MD;  Location: [facility];  Service: Orthopedics;  Laterality: Right;  Re-Do Lumbar Decompression L5-S1 on Right,    PILONIDAL CYST EXCISION      Allergies  Allergen Reactions   Other     LEMON-sores in mouth    Immunization History  Administered Date(s) Administered   Influenza Inj Mdck Quad Pf 11/22/2016   Influenza Whole 12/24/2013, 11/07/2014   Influenza,inj,Quad PF,6+ Mos 02/20/2022   PFIZER(Purple Top)SARS-COV-2 Vaccination 06/07/2019, 07/08/2019   Tdap 11/22/2016    Family History  Problem Relation Age of Onset   COPD Father    Congestive Heart Failure Father    Breast cancer Mother       Current Outpatient Medications:    benzonatate (TESSALON) 100 MG capsule, Take 1 capsule (100 mg total) by mouth 3 (three) times daily as needed for cough., Disp: 30 capsule, Rfl: 0   cetirizine (ZYRTEC) 10 MG tablet, Take 20 mg by mouth daily., Disp: , Rfl:    citalopram (CELEXA) 40 MG tablet, Take 1 tablet (40 mg total) by mouth daily., Disp: 90 tablet, Rfl: 0   fluticasone (FLONASE) 50 MCG/ACT nasal spray, Place 2 sprays into both nostrils daily., Disp: 16 g, Rfl: 0   fluticasone furoate-vilanterol (BREO ELLIPTA) 200-25 MCG/ACT AEPB, Inhale 1 puff into the lungs daily., Disp: , Rfl:    ipratropium (ATROVENT) 0.03 % nasal spray, Place 2 sprays into both nostrils every 12 (twelve) hours., Disp: 30 mL, Rfl: 0   losartan (COZAAR) 100 MG tablet, Take 1 tablet (100 mg total) by mouth daily., Disp: 90 tablet, Rfl: 0   methocarbamol (ROBAXIN) 500 MG tablet, Take 1 tablet (500 mg total) by mouth every 6 (six) hours as needed for muscle spasms., Disp: 90 tablet, Rfl: 0   montelukast (SINGULAIR) 10 MG tablet, Take 1 tablet (10 mg total) by mouth daily., Disp: 90 tablet, Rfl: 3   pantoprazole (PROTONIX) 40 MG tablet, Take 1 tablet (40 mg total) by mouth daily., Disp: 90 tablet, Rfl: 3   Vitamin D, Ergocalciferol, (DRISDOL) 1.25 MG (50000 UNIT) CAPS capsule, Take 1 capsule (50,000 Units total) by mouth every 7 (seven) days., Disp: 8 capsule, Rfl: 0      Objective:   Vitals:   11/20/22 0829  BP: (!) 144/76  Pulse: 90  Temp: 98.3 F (36.8 C)  TempSrc: Oral  SpO2: 98%  Weight: 297 lb (134.7 kg)  Height: 5\' 10"  (1.778 m)    Estimated body mass index is 42.62 kg/m as calculated from the following:   Height as of this encounter: 5\' 10"  (1.778 m).   Weight as of this encounter: 297 lb (134.7 kg).  @WEIGHTCHANGE @  [employer]   11/20/22 0829  Weight: 297 lb (134.7 kg)     Physical Exam   General: No distress. Morbidly obese O2 at rest: no Cane present: no Sitting in wheel  chair: no Frail: no Obese: no Neuro: Alert and Oriented x 3. GCS 15. Speech normal Psych: Pleasant Resp:  Barrel Chest - no.  Wheeze - no, Crackles - no, No overt respiratory distress CVS: Normal heart sounds. Murmurs - no Ext: Stigmata of Connective Tissue Disease - no HEENT: Normal upper airway. PEERL +. No post nasal drip        Assessment:       ICD-10-CM  1. Chronic cough  R05.3     2. DOE (dyspnea on exertion)  R06.09     3. Ground glass opacity present on imaging of lung  R91.8     4. Eosinophilia, unspecified type  D72.10     5. History of seasonal allergies  Z88.9     6. Dizziness  R42     7. History of obstructive sleep apnea  Z86.69          Plan:     Patient Instructions  Chronic cough DOE (dyspnea on exertion) Ground glass opacity present on imaging of lung Eosinophilia, unspecified type History of seasonal allergies  -Seems at least in part some of his symptoms are related to your pulmonary infiltrates of unclear cause and potential asthma.  Asthma diagnosis based on history of seasonal allergies, partial improvement with Breo and elevated blood eosinophils  Plan  - Read Breo - Continue albuterol as needed - Check blood IgE -Get high-resolution CT chest supine and prone in 2 months as follow-up. -Do pulmonary function test [full PFT] in the next 4-8 weeks  Dizziness History tachycardia  Plan  - get echo -refer cardiology   History of obstructive sleep apnea Morbid obesity  Plan  - refer sleep doctor  Follow-up - Return to see Dr. Marchelle Gearing or nurse practitioner in 8 weeks but after completing all of the above.   FOLLOWUP No follow-ups on file.    SIGNATURE    Dr. Kalman Shan, M.D., F.C.C.P,  Pulmonary and Critical Care Medicine Staff Physician, [facility] Director - Interstitial Lung Disease  Program  Pulmonary Fibrosis [facility] [place], [place],  [address]  Pager: [phone], If no answer or between  15:00h - 7:00h: call [phone] Telephone: [phone]  8:58 AM 11/20/2022

## 2022-11-20 NOTE — Addendum Note (Signed)
Addended by: Christen Butter on: 11/20/2022 09:41 AM   Modules accepted: Orders

## 2022-11-20 NOTE — Patient Instructions (Addendum)
Chronic cough DOE (dyspnea on exertion) Ground glass opacity present on imaging of lung Eosinophilia, unspecified type History of seasonal allergies  -Seems at least in part some of his symptoms are related to your pulmonary infiltrates of unclear cause and potential asthma.  Asthma diagnosis based on history of seasonal allergies, partial improvement with Breo and elevated blood eosinophils  Plan  - Refil  Breo and take it regularly - continue singulari scheduled - Continue albuterol as needed - Check blood IgE -Get high-resolution CT chest supine and prone in 2 months as follow-up. -Do pulmonary function test [full PFT] in the next 4-8 weeks  Dizziness History tachycardia  Plan  - get echo -refer cardiology   History of obstructive sleep apnea Morbid obesity  Plan  - refer sleep doctor  Follow-up - Return to see Dr. Marchelle Gearing or nurse practitioner in 8 weeks but after completing all of the above.

## 2022-11-21 LAB — IGE: IgE (Immunoglobulin E), Serum: 612 kU/L — ABNORMAL HIGH (ref <=114)

## 2022-11-22 ENCOUNTER — Other Ambulatory Visit (HOSPITAL_BASED_OUTPATIENT_CLINIC_OR_DEPARTMENT_OTHER): Payer: Self-pay

## 2022-11-22 ENCOUNTER — Other Ambulatory Visit: Payer: Self-pay

## 2022-11-22 ENCOUNTER — Encounter: Payer: Self-pay | Admitting: Internal Medicine

## 2022-11-22 MED ORDER — ALBUTEROL SULFATE HFA 108 (90 BASE) MCG/ACT IN AERS
2.0000 | INHALATION_SPRAY | RESPIRATORY_TRACT | 5 refills | Status: DC | PRN
Start: 2022-11-22 — End: 2023-12-02
  Filled 2022-11-22: qty 6.7, 17d supply, fill #0
  Filled 2023-03-18: qty 18, 17d supply, fill #1
  Filled 2023-05-28: qty 18, 17d supply, fill #2
  Filled 2023-07-29: qty 18, 17d supply, fill #3
  Filled 2023-09-20: qty 36, 34d supply, fill #4

## 2022-11-22 MED ORDER — FLUTICASONE FUROATE-VILANTEROL 200-25 MCG/ACT IN AEPB
1.0000 | INHALATION_SPRAY | Freq: Every day | RESPIRATORY_TRACT | 5 refills | Status: DC
  Filled 2022-11-22: qty 60, 30d supply, fill #0
  Filled 2022-12-21: qty 60, 30d supply, fill #1
  Filled 2023-01-29: qty 60, 30d supply, fill #2
  Filled 2023-02-24 – 2023-02-27 (×2): qty 60, 30d supply, fill #3
  Filled 2023-04-08: qty 60, 30d supply, fill #4
  Filled 2023-05-16 – 2023-05-17 (×3): qty 60, 30d supply, fill #5

## 2022-11-27 NOTE — Progress Notes (Signed)
Blood IgE elevation again c/w asthma hx

## 2022-12-04 ENCOUNTER — Other Ambulatory Visit: Payer: 59

## 2022-12-11 ENCOUNTER — Ambulatory Visit (HOSPITAL_COMMUNITY): Payer: 59 | Attending: Internal Medicine

## 2022-12-11 DIAGNOSIS — R0609 Other forms of dyspnea: Secondary | ICD-10-CM | POA: Insufficient documentation

## 2022-12-11 LAB — ECHOCARDIOGRAM COMPLETE
Area-P 1/2: 4.29 cm2
S' Lateral: 3.3 cm

## 2022-12-11 MED ORDER — PERFLUTREN LIPID MICROSPHERE
1.0000 mL | INTRAVENOUS | Status: AC | PRN
Start: 2022-12-11 — End: 2022-12-11
  Administered 2022-12-11: 2 mL via INTRAVENOUS

## 2022-12-12 ENCOUNTER — Ambulatory Visit
Admission: RE | Admit: 2022-12-12 | Discharge: 2022-12-12 | Disposition: A | Discharge status: Home / Self Care | Payer: 59 | Source: Ambulatory Visit | Attending: Internal Medicine | Admitting: Internal Medicine

## 2022-12-12 DIAGNOSIS — R918 Other nonspecific abnormal finding of lung field: Secondary | ICD-10-CM

## 2022-12-12 DIAGNOSIS — R0609 Other forms of dyspnea: Secondary | ICD-10-CM | POA: Diagnosis not present

## 2022-12-12 DIAGNOSIS — D721 Eosinophilia, unspecified: Secondary | ICD-10-CM

## 2022-12-12 DIAGNOSIS — K760 Fatty (change of) liver, not elsewhere classified: Secondary | ICD-10-CM | POA: Diagnosis not present

## 2022-12-12 DIAGNOSIS — R053 Chronic cough: Secondary | ICD-10-CM

## 2022-12-12 DIAGNOSIS — I288 Other diseases of pulmonary vessels: Secondary | ICD-10-CM | POA: Diagnosis not present

## 2022-12-12 DIAGNOSIS — I10 Essential (primary) hypertension: Secondary | ICD-10-CM | POA: Diagnosis not present

## 2022-12-12 DIAGNOSIS — I700 Atherosclerosis of aorta: Secondary | ICD-10-CM | POA: Diagnosis not present

## 2022-12-21 ENCOUNTER — Other Ambulatory Visit: Payer: Self-pay

## 2022-12-21 ENCOUNTER — Other Ambulatory Visit (HOSPITAL_COMMUNITY): Payer: Self-pay

## 2022-12-22 ENCOUNTER — Other Ambulatory Visit: Payer: Self-pay

## 2023-01-02 NOTE — Progress Notes (Unsigned)
Cardiology Office Note:    Date:  01/03/2023   ID:  Ryan Holland, DOB 1970-04-04, MRN [id number]  PCP:  Esperanza Richters, [name]  Cardiologist:  None  Electrophysiologist:  None   Referring MD: Kalman Shan, MD   Chief Complaint  Patient presents with   Chest Pain    Pt state that he has pain in right side of chest and goes down right arm at times    History of Present Illness:    Ryan Holland is a 52 y.o. male with a hx of hypertension, OSA, former tobacco use who is referred by Esperanza Richters, PA for evaluation of tachycardia.  Echocardiogram 12/11/2022 showed EF 60 to 65%, normal diastolic function, normal RV function, mild dilatation of ascending aorta measuring 41 mm.  He reports that he started having symptoms of shortness of breath and dizziness in July 2024.  Was very stressful time at work at that time.  He also reports having some chest pain on right side of chest, no clear relationship with exertion.  He reports has been having episodes of tachycardia, has recorded pulse as high as 200 on his pulse ox.  Denies any syncope.  He has had lower extremity swelling and left leg, had ultrasound done in the ED which was negative.  He smoked for 20 years over 1 pack/day, quit in 2013.  Family history includes father had CHF and also had carotid endarterectomy.   Past Medical History:  Diagnosis Date   Anxiety    GERD (gastroesophageal reflux disease)    OCCAS-WOULD USE OTC MED IF NEEDED   HNP (herniated nucleus pulposus), lumbar    PAIN IN LOWER BACK AND PAIN DOWN BOTH LEGS -12-24-14 radiates down right leg presently.   Hypertension    Sleep apnea    HAS CPAP-DOES NOT USE-DOES NOT KNOW SETTINGS    Past Surgical History:  Procedure Laterality Date   01/05/2011 LUMBAR DECOMPRESSION AND MICRODISCECTOMY     CERVICAL FUSION  C4-5      ESOPHAGOGASTRODUODENOSCOPY (EGD) WITH PROPOFOL N/A 12/30/2014   Procedure: ESOPHAGOGASTRODUODENOSCOPY (EGD) WITH PROPOFOL;  Surgeon:  Willis Modena, MD;  Location: [facility];  Service: Endoscopy;  Laterality: N/A;  With Bravo   LUMBAR LAMINECTOMY/DECOMPRESSION MICRODISCECTOMY  07/26/2011   Procedure: LUMBAR LAMINECTOMY/DECOMPRESSION MICRODISCECTOMY;  Surgeon: Javier Docker, MD;  Location: [facility];  Service: Orthopedics;  Laterality: Right;  Re-Do Lumbar Decompression L5-S1 on Right,    PILONIDAL CYST EXCISION      Current Medications: Current Meds  Medication Sig   albuterol (PROAIR HFA) 108 (90 Base) MCG/ACT inhaler Inhale 2 puffs into the lungs every 4 (four) hours as needed for wheezing or shortness of breath.   benzonatate (TESSALON) 100 MG capsule Take 1 capsule (100 mg total) by mouth 3 (three) times daily as needed for cough.   cetirizine (ZYRTEC) 10 MG tablet Take 20 mg by mouth daily.   citalopram (CELEXA) 40 MG tablet Take 1 tablet (40 mg total) by mouth daily.   fluticasone (FLONASE) 50 MCG/ACT nasal spray Place 2 sprays into both nostrils daily.   fluticasone furoate-vilanterol (BREO ELLIPTA) 200-25 MCG/ACT AEPB Inhale 1 puff into the lungs daily.   ipratropium (ATROVENT) 0.03 % nasal spray Place 2 sprays into both nostrils every 12 (twelve) hours.   losartan (COZAAR) 100 MG tablet Take 1 tablet (100 mg total) by mouth daily.   methocarbamol (ROBAXIN) 500 MG tablet Take 1 tablet (500 mg total) by mouth every 6 (six) hours as needed for muscle spasms.  metoprolol tartrate (LOPRESSOR) 100 MG tablet Take 1 tablet (100mg ) TWO hours prior to CT scan   montelukast (SINGULAIR) 10 MG tablet Take 1 tablet (10 mg total) by mouth daily.   pantoprazole (PROTONIX) 40 MG tablet Take 1 tablet (40 mg total) by mouth daily.   Vitamin D, Ergocalciferol, (DRISDOL) 1.25 MG (50000 UNIT) CAPS capsule Take 1 capsule (50,000 Units total) by mouth every 7 (seven) days.     Allergies:   Other   Social History   Socioeconomic History   Marital status: Married    Spouse name: Not on file   Number of children: Not on file    Years of education: Not on file   Highest education level: Not on file  Occupational History   Not on file  Tobacco Use   Smoking status: Former    Current packs/day: 0.00    Average packs/day: 2.0 packs/day for 20.0 years (40.0 ttl pk-yrs)    Types: Cigarettes    Start date: 10/05/1991    Quit date: 10/05/2011    Years since quitting: 11.2   Smokeless tobacco: Former    Types: Chew  Substance and Sexual Activity   Alcohol use: Yes    Alcohol/week: 0.0 standard drinks of alcohol    Comment: 2 MIXED DRINKS (VODKA) DAILY-usually    Drug use: No   Sexual activity: Not on file  Other Topics Concern   Not on file  Social History Narrative   Not on file   Social Determinants of Health   Financial Resource Strain: Not on file  Food Insecurity: Not on file  Transportation Needs: Not on file  Physical Activity: Not on file  Stress: Not on file  Social Connections: Unknown (07/17/2021)   Received from [facility]   Social Network    Social Network: Not on file     Family History: The patient's family history includes Breast cancer in his mother; COPD in his father; Congestive Heart Failure in his father.  ROS:   Please see the history of present illness.     All other systems reviewed and are negative.  EKGs/Labs/Other Studies Reviewed:    The following studies were reviewed today:   EKG:   01/03/2023: Normal sinus rhythm, rate 87, nonspecific T wave flattening  Recent Labs: 10/26/2022: TSH 1.13 11/02/2022: ALT 73; B Natriuretic Peptide 32.1; BUN 10; Creatinine, Ser 0.80; Potassium 3.6; Sodium 138 11/10/2022: Hemoglobin 15.1; Platelets 166.0; Pro B Natriuretic peptide (BNP) 17.0  Recent Lipid Panel    Component Value Date/Time   CHOL 274 (H) 10/26/2022 1545   CHOL 214 (H) 04/19/2020 1028   TRIG 340.0 (H) 10/26/2022 1545   HDL 59.70 10/26/2022 1545   HDL 85 04/19/2020 1028   CHOLHDL 5 10/26/2022 1545   VLDL 68.0 (H) 10/26/2022 1545   LDLCALC 110 (H)  04/19/2020 1028   LDLDIRECT 189.0 10/26/2022 1545    Physical Exam:    VS:  BP 122/80 (BP Location: Right Arm, Patient Position: Sitting, Cuff Size: Large)   Pulse 87   Ht 5\' 10"  (1.778 m)   Wt 295 lb (133.8 kg)   SpO2 96%   BMI 42.33 kg/m     Wt Readings from Last 3 Encounters:  01/03/23 295 lb (133.8 kg)  11/20/22 297 lb (134.7 kg)  11/10/22 (!) 304 lb (137.9 kg)     GEN:  Well nourished, well developed in no acute distress HEENT: Normal NECK: No JVD; No carotid bruits LYMPHATICS: No lymphadenopathy CARDIAC: RRR,  no murmurs, rubs, gallops RESPIRATORY:  Clear to auscultation without rales, wheezing or rhonchi  ABDOMEN: Soft, non-tender, non-distended MUSCULOSKELETAL:  No edema; No deformity  SKIN: Warm and dry NEUROLOGIC:  Alert and oriented x 3 PSYCHIATRIC:  Normal affect   ASSESSMENT:    1. Tachycardia   2. DOE (dyspnea on exertion)   3. Precordial pain   4. Chest pain of uncertain etiology   5. Aortic dilatation (HCC)   6. Morbid obesity (HCC)    PLAN:    Tachycardia: Description concerning for arrhythmia, will evaluate with Zio patch x 7 days  Chest pain: Atypical in description but does have multiple CAD risk factors (age, hypertension, former tobacco use).  Recommend coronary CTA to evaluate for obstructive CAD.  Will give Lopressor 100 mg prior to study.  Dyspnea: Follows with pulmonology, has pulmonary filtrates of unclear cause and possible asthma.  Echocardiogram 12/11/2022 showed EF 60 to 65%, normal diastolic function, normal RV function, mild dilatation of ascending aorta measuring 41 mm.  Plan coronary CTA as above to rule out obstructive CAD  Aortic dilatation: Measured 41 mm on echocardiogram 12/11/2022, will monitor  Hypertension: On losartan 100 mg daily.  Appears controlled  OSA: has CPAP, reports uses 5-6 times per week.  Reports having issues with his device, will refer to sleep medicine  Morbid obesity: Body mass index is 42.33 kg/m.   Diet/exercise encouraged.  Previously tried healthy weight and wellness, but unable to do program due to his job   RTC in 3 months   Medication Adjustments/Labs and Tests Ordered: Current medicines are reviewed at length with the patient today.  Concerns regarding medicines are outlined above.  Orders Placed This Encounter  Procedures   CT CORONARY MORPH W/CTA COR W/SCORE W/CA W/CM &/OR WO/CM   Basic metabolic panel   Ambulatory referral to Pulmonology   LONG TERM MONITOR (3-14 DAYS)   EKG 12-Lead   Meds ordered this encounter  Medications   metoprolol tartrate (LOPRESSOR) 100 MG tablet    Sig: Take 1 tablet (100mg ) TWO hours prior to CT scan    Dispense:  1 tablet    Refill:  0    Patient Instructions  Medication Instructions:  Your physician recommends that you continue on your current medications as directed. Please refer to the Current Medication list given to you today.  *If you need a refill on your cardiac medications before your next appointment, please call your pharmacy*   Lab Work: Your physician recommends that you have labs drawn today: BMET   If you have labs (blood work) drawn today and your tests are completely normal, you will receive your results only by: MyChart Message (if you have MyChart) OR A paper copy in the mail If you have any lab test that is abnormal or we need to change your treatment, we will call you to review the results.   Testing/Procedures:  Christena Deem- Long Term Monitor Instructions  Your physician has requested you wear a ZIO patch monitor for 7 days.  This is a single patch monitor. [employer] supplies one patch monitor per enrollment. Additional stickers are not available. Please do not apply patch if you will be having a Nuclear Stress Test,  Echocardiogram, Cardiac CT, MRI, or Chest Xray during the period you would be wearing the  monitor. The patch cannot be worn during these tests. You cannot remove and re-apply the  ZIO XT patch  monitor.  Your ZIO patch monitor will be mailed 3 day USPS  to your address on file. It may take 3-5 days  to receive your monitor after you have been enrolled.  Once you have received your monitor, please review the enclosed instructions. Your monitor  has already been registered assigning a specific monitor serial # to you.  Billing and Patient Assistance Program Information  We have supplied [employer] with any of your insurance information on file for billing purposes. [employer] offers a sliding scale Patient Assistance Program for patients that do not have  insurance, or whose insurance does not completely cover the cost of the ZIO monitor.  You must apply for the Patient Assistance Program to qualify for this discounted rate.  To apply, please call [employer] at [phone], select option 4, select option 2, ask to apply for  Patient Assistance Program. Meredeth Ide will ask your household income, and how many people  are in your household. They will quote your out-of-pocket cost based on that information.  [employer] will also be able to set up a 6-month, interest-free payment plan if needed.  Applying the monitor   Shave hair from upper left chest.  Hold abrader disc by orange tab. Rub abrader in 40 strokes over the upper left chest as  indicated in your monitor instructions.  Clean area with 4 enclosed alcohol pads. Let dry.  Apply patch as indicated in monitor instructions. Patch will be placed under collarbone on left  side of chest with arrow pointing upward.  Rub patch adhesive wings for 2 minutes. Remove white label marked "1". Remove the white  label marked "2". Rub patch adhesive wings for 2 additional minutes.  While looking in a mirror, press and release button in center of patch. A small green light will  flash 3-4 times. This will be your only indicator that the monitor has been turned on.  Do not shower for the first 24 hours. You may shower after the first 24 hours.  Press the  button if you feel a symptom. You will hear a small click. Record Date, Time and  Symptom in the Patient Logbook.  When you are ready to remove the patch, follow instructions on the last 2 pages of Patient  Logbook. Stick patch monitor onto the last page of Patient Logbook.  Place Patient Logbook in the blue and white box. Use locking tab on box and tape box closed  securely. The blue and white box has prepaid postage on it. Please place it in the mailbox as  soon as possible. Your physician should have your test results approximately 7 days after the  monitor has been mailed back to [facility].  Call [facility] Customer Care at [phone] if you have questions regarding  your ZIO XT patch monitor. Call them immediately if you see an orange light blinking on your  monitor.  If your monitor falls off in less than 4 days, contact our Monitor department at [phone].  If your monitor becomes loose or falls off after 4 days call [employer] at [phone] for  suggestions on securing your monitor    Follow-Up: At [facility], you and your health needs are our priority.  As part of our continuing mission to provide you with exceptional heart care, we have created designated Provider Care Teams.  These Care Teams include your primary Cardiologist (physician) and Advanced Practice Providers (APPs -  Physician Assistants and Nurse Practitioners) who all work together to provide you with the care you need, when you need it.  We recommend signing up for the  patient portal called "MyChart".  Sign up information is provided on this After Visit Summary.  MyChart is used to connect with patients for Virtual Visits (Telemedicine).  Patients are able to view lab/test results, encounter notes, upcoming appointments, etc.  Non-urgent messages can be sent to your provider as well.   To learn more about what you can do with MyChart, go to [email or web address].    Your next appointment:    3 month(s)  Provider:   Dr. Epifanio Lesches     Other Instructions   Your cardiac CT will be scheduled at the below location:   [facility] [address] [place], [place] [address] [phone]   If scheduled at [facility], please arrive at the [facility] and Children's Entrance (Entrance C2) of [facility] 30 minutes prior to test start time. You can use the FREE valet parking offered at entrance C (encouraged to control the heart rate for the test)  Proceed to the [facility] Radiology Department (first floor) to check-in and test prep.  All radiology patients and guests should use entrance C2 at [facility], accessed from [facility], even though the hospital's physical address listed is [address]     Please follow these instructions carefully (unless otherwise directed):  An IV will be required for this test and Nitroglycerin will be given.  Hold all erectile dysfunction medications at least 3 days (72 hrs) prior to test. (Ie viagra, cialis, sildenafil, tadalafil, etc)   On the Night Before the Test: Be sure to Drink plenty of water. Do not consume any caffeinated/decaffeinated beverages or chocolate 12 hours prior to your test. Do not take any antihistamines 12 hours prior to your test.  On the Day of the Test: Drink plenty of water until 1 hour prior to the test. Do not eat any food 1 hour prior to test. You may take your regular medications prior to the test.  Take metoprolol (Lopressor) 100mg  two hours prior to test. If you take Furosemide/Hydrochlorothiazide/Spironolactone, please HOLD on the morning of the test.       After the Test: Drink plenty of water. After receiving IV contrast, you may experience a mild flushed feeling. This is normal. On occasion, you may experience a mild rash up to 24 hours after the test. This is not dangerous. If this occurs, you can take Benadryl 25 mg and  increase your fluid intake. If you experience trouble breathing, this can be serious. If it is severe call 911 IMMEDIATELY. If it is mild, please call our office. If you take any of these medications: Glipizide/Metformin, Avandament, Glucavance, please do not take 48 hours after completing test unless otherwise instructed.  We will call to schedule your test 2-4 weeks out understanding that some insurance companies will need an authorization prior to the service being performed.   For more information and frequently asked questions, please visit our website : [email or web address]  For non-scheduling related questions, please contact the cardiac imaging nurse navigator should you have any questions/concerns: Cardiac Imaging Nurse Navigators Direct Office Dial: [phone]   For scheduling needs, including cancellations and rescheduling, please call [place], [phone].     Signed, Little Ishikawa, MD  01/03/2023 5:35 PM    [facility]

## 2023-01-03 ENCOUNTER — Other Ambulatory Visit (HOSPITAL_BASED_OUTPATIENT_CLINIC_OR_DEPARTMENT_OTHER): Payer: Self-pay

## 2023-01-03 ENCOUNTER — Ambulatory Visit (INDEPENDENT_AMBULATORY_CARE_PROVIDER_SITE_OTHER): Payer: 59

## 2023-01-03 ENCOUNTER — Encounter: Payer: Self-pay | Admitting: Cardiology

## 2023-01-03 ENCOUNTER — Ambulatory Visit: Payer: 59 | Attending: Cardiology | Admitting: Cardiology

## 2023-01-03 VITALS — BP 122/80 | HR 87 | Ht 70.0 in | Wt 295.0 lb

## 2023-01-03 DIAGNOSIS — R072 Precordial pain: Secondary | ICD-10-CM

## 2023-01-03 DIAGNOSIS — I77819 Aortic ectasia, unspecified site: Secondary | ICD-10-CM

## 2023-01-03 DIAGNOSIS — R079 Chest pain, unspecified: Secondary | ICD-10-CM

## 2023-01-03 DIAGNOSIS — R0609 Other forms of dyspnea: Secondary | ICD-10-CM

## 2023-01-03 DIAGNOSIS — R000 Tachycardia, unspecified: Secondary | ICD-10-CM | POA: Diagnosis not present

## 2023-01-03 MED ORDER — METOPROLOL TARTRATE 100 MG PO TABS
ORAL_TABLET | ORAL | 0 refills | Status: DC
  Filled 2023-01-03: qty 1, 1d supply, fill #0

## 2023-01-03 NOTE — Progress Notes (Unsigned)
Enrolled patient for a 7 day Zio XT monitor to be mailed to patients home.  

## 2023-01-03 NOTE — Patient Instructions (Addendum)
Medication Instructions:  Your physician recommends that you continue on your current medications as directed. Please refer to the Current Medication list given to you today.  *If you need a refill on your cardiac medications before your next appointment, please call your pharmacy*   Lab Work: Your physician recommends that you have labs drawn today: BMET   If you have labs (blood work) drawn today and your tests are completely normal, you will receive your results only by: MyChart Message (if you have MyChart) OR A paper copy in the mail If you have any lab test that is abnormal or we need to change your treatment, we will call you to review the results.   Testing/Procedures:  Christena Deem- Long Term Monitor Instructions  Your physician has requested you wear a ZIO patch monitor for 7 days.  This is a single patch monitor. [employer] supplies one patch monitor per enrollment. Additional stickers are not available. Please do not apply patch if you will be having a Nuclear Stress Test,  Echocardiogram, Cardiac CT, MRI, or Chest Xray during the period you would be wearing the  monitor. The patch cannot be worn during these tests. You cannot remove and re-apply the  ZIO XT patch monitor.  Your ZIO patch monitor will be mailed 3 day USPS to your address on file. It may take 3-5 days  to receive your monitor after you have been enrolled.  Once you have received your monitor, please review the enclosed instructions. Your monitor  has already been registered assigning a specific monitor serial # to you.  Billing and Patient Assistance Program Information  We have supplied [employer] with any of your insurance information on file for billing purposes. [employer] offers a sliding scale Patient Assistance Program for patients that do not have  insurance, or whose insurance does not completely cover the cost of the ZIO monitor.  You must apply for the Patient Assistance Program to qualify for this  discounted rate.  To apply, please call [employer] at [phone], select option 4, select option 2, ask to apply for  Patient Assistance Program. Meredeth Ide will ask your household income, and how many people  are in your household. They will quote your out-of-pocket cost based on that information.  [employer] will also be able to set up a 30-month, interest-free payment plan if needed.  Applying the monitor   Shave hair from upper left chest.  Hold abrader disc by orange tab. Rub abrader in 40 strokes over the upper left chest as  indicated in your monitor instructions.  Clean area with 4 enclosed alcohol pads. Let dry.  Apply patch as indicated in monitor instructions. Patch will be placed under collarbone on left  side of chest with arrow pointing upward.  Rub patch adhesive wings for 2 minutes. Remove white label marked "1". Remove the white  label marked "2". Rub patch adhesive wings for 2 additional minutes.  While looking in a mirror, press and release button in center of patch. A small green light will  flash 3-4 times. This will be your only indicator that the monitor has been turned on.  Do not shower for the first 24 hours. You may shower after the first 24 hours.  Press the button if you feel a symptom. You will hear a small click. Record Date, Time and  Symptom in the Patient Logbook.  When you are ready to remove the patch, follow instructions on the last 2 pages of Patient  Logbook. Stick patch monitor  onto the last page of Patient Logbook.  Place Patient Logbook in the blue and white box. Use locking tab on box and tape box closed  securely. The blue and white box has prepaid postage on it. Please place it in the mailbox as  soon as possible. Your physician should have your test results approximately 7 days after the  monitor has been mailed back to [facility].  Call [facility] Customer Care at [phone] if you have questions regarding  your ZIO XT patch monitor. Call  them immediately if you see an orange light blinking on your  monitor.  If your monitor falls off in less than 4 days, contact our Monitor department at [phone].  If your monitor becomes loose or falls off after 4 days call [employer] at [phone] for  suggestions on securing your monitor    Follow-Up: At [facility], you and your health needs are our priority.  As part of our continuing mission to provide you with exceptional heart care, we have created designated Provider Care Teams.  These Care Teams include your primary Cardiologist (physician) and Advanced Practice Providers (APPs -  Physician Assistants and Nurse Practitioners) who all work together to provide you with the care you need, when you need it.  We recommend signing up for the patient portal called "MyChart".  Sign up information is provided on this After Visit Summary.  MyChart is used to connect with patients for Virtual Visits (Telemedicine).  Patients are able to view lab/test results, encounter notes, upcoming appointments, etc.  Non-urgent messages can be sent to your provider as well.   To learn more about what you can do with MyChart, go to [email or web address].    Your next appointment:   3 month(s)  Provider:   Dr. Epifanio Lesches     Other Instructions   Your cardiac CT will be scheduled at the below location:   [facility] [address] [place], [place] [address] [phone]   If scheduled at [facility], please arrive at the [facility] and Children's Entrance (Entrance C2) of [facility] 30 minutes prior to test start time. You can use the FREE valet parking offered at entrance C (encouraged to control the heart rate for the test)  Proceed to the [facility] Radiology Department (first floor) to check-in and test prep.  All radiology patients and guests should use entrance C2 at [facility], accessed from [facility], even though  the hospital's physical address listed is [address]     Please follow these instructions carefully (unless otherwise directed):  An IV will be required for this test and Nitroglycerin will be given.  Hold all erectile dysfunction medications at least 3 days (72 hrs) prior to test. (Ie viagra, cialis, sildenafil, tadalafil, etc)   On the Night Before the Test: Be sure to Drink plenty of water. Do not consume any caffeinated/decaffeinated beverages or chocolate 12 hours prior to your test. Do not take any antihistamines 12 hours prior to your test.  On the Day of the Test: Drink plenty of water until 1 hour prior to the test. Do not eat any food 1 hour prior to test. You may take your regular medications prior to the test.  Take metoprolol (Lopressor) 100mg  two hours prior to test. If you take Furosemide/Hydrochlorothiazide/Spironolactone, please HOLD on the morning of the test.       After the Test: Drink plenty of water. After receiving IV contrast, you  may experience a mild flushed feeling. This is normal. On occasion, you may experience a mild rash up to 24 hours after the test. This is not dangerous. If this occurs, you can take Benadryl 25 mg and increase your fluid intake. If you experience trouble breathing, this can be serious. If it is severe call 911 IMMEDIATELY. If it is mild, please call our office. If you take any of these medications: Glipizide/Metformin, Avandament, Glucavance, please do not take 48 hours after completing test unless otherwise instructed.  We will call to schedule your test 2-4 weeks out understanding that some insurance companies will need an authorization prior to the service being performed.   For more information and frequently asked questions, please visit our website : [email or web address]  For non-scheduling related questions, please contact the cardiac imaging nurse navigator should you have any  questions/concerns: Cardiac Imaging Nurse Navigators Direct Office Dial: [phone]   For scheduling needs, including cancellations and rescheduling, please call [place], [phone].

## 2023-01-04 LAB — BASIC METABOLIC PANEL
BUN/Creatinine Ratio: 13 (ref 9–20)
BUN: 11 mg/dL (ref 6–24)
CO2: 21 mmol/L (ref 20–29)
Calcium: 9.2 mg/dL (ref 8.7–10.2)
Chloride: 102 mmol/L (ref 96–106)
Creatinine, Ser: 0.85 mg/dL (ref 0.76–1.27)
Glucose: 77 mg/dL (ref 70–99)
Potassium: 4 mmol/L (ref 3.5–5.2)
Sodium: 140 mmol/L (ref 134–144)
eGFR: 106 mL/min/{1.73_m2} (ref >59)

## 2023-01-05 ENCOUNTER — Telehealth (HOSPITAL_COMMUNITY): Payer: Self-pay | Admitting: *Deleted

## 2023-01-05 NOTE — Telephone Encounter (Signed)
Reaching out to patient to offer assistance regarding upcoming cardiac imaging study; pt verbalizes understanding of appt date/time, parking situation and where to check in, pre-test NPO status and medications ordered, and verified current allergies; name and call back number provided for further questions should they arise  Ryan Brick RN Navigator Cardiac Imaging [facility] [phone] office [phone] cell  Patient to take 100mg  metoprolol tartrate two hours prior to her cardiac CT scan. He is aware to arrive at 7:30 AM.

## 2023-01-08 ENCOUNTER — Ambulatory Visit (HOSPITAL_COMMUNITY)
Admission: RE | Admit: 2023-01-08 | Discharge: 2023-01-08 | Disposition: A | Discharge status: Home / Self Care | Payer: 59 | Source: Ambulatory Visit | Attending: Cardiology | Admitting: Cardiology

## 2023-01-08 DIAGNOSIS — R072 Precordial pain: Secondary | ICD-10-CM | POA: Insufficient documentation

## 2023-01-08 MED ORDER — IOHEXOL 350 MG/ML SOLN
100.0000 mL | Freq: Once | INTRAVENOUS | Status: AC | PRN
  Administered 2023-01-08: 205 mL via INTRAVENOUS

## 2023-01-08 MED ORDER — NITROGLYCERIN 0.4 MG SL SUBL
SUBLINGUAL_TABLET | SUBLINGUAL | Status: AC
  Filled 2023-01-08: qty 2

## 2023-01-08 MED ORDER — NITROGLYCERIN 0.4 MG SL SUBL
0.8000 mg | SUBLINGUAL_TABLET | Freq: Once | SUBLINGUAL | Status: AC
  Administered 2023-01-08: 0.8 mg via SUBLINGUAL

## 2023-01-08 NOTE — Progress Notes (Signed)
Patient ID: Ryan Holland, male   DOB: 18-May-1970, 52 y.o.   MRN: [id number] Iv infiltrated during injection of CT dye. Swelling and bruisng at iv site at left antecubital area. [facility] PA assessed pt.  Per DR.O'neal repeat CT heart

## 2023-01-08 NOTE — Progress Notes (Signed)
  Evaluation after Contrast Extravasation  Patient seen and examined immediately after contrast extravasation while in [facility] CT. Approximately 25 ml contrast extravasated from left forearm IV.  Exam: There is mild swelling at the left [facility] area.  There is mild erythema. There is mild discoloration. There are no blisters. There are no signs of decreased perfusion of the skin.  It is warm to touch.  The patient has full ROM in fingers.  Radial pulse is normal.  Per contrast extravasation protocol, I have instructed the patient to keep an ice pack on the area for 20-60 minutes at a time for about 48 hours.   Keep arm elevated as much as possible.   The patient understands to call the radiology department if there is: - increase in pain or swelling - changed or altered sensation - ulceration or blistering - increasing redness - warmth or increasing firmness - decreased tissue perfusion as noted by decreased capillary refill or discoloration of skin - decreased pulses peripheral to site   Mickie Kay, NP 01/08/2023 8:47 AM

## 2023-01-09 ENCOUNTER — Other Ambulatory Visit (HOSPITAL_BASED_OUTPATIENT_CLINIC_OR_DEPARTMENT_OTHER): Payer: Self-pay

## 2023-01-09 ENCOUNTER — Other Ambulatory Visit: Payer: Self-pay

## 2023-01-09 DIAGNOSIS — R079 Chest pain, unspecified: Secondary | ICD-10-CM

## 2023-01-09 DIAGNOSIS — R0609 Other forms of dyspnea: Secondary | ICD-10-CM

## 2023-01-09 DIAGNOSIS — R072 Precordial pain: Secondary | ICD-10-CM

## 2023-01-09 DIAGNOSIS — E7800 Pure hypercholesterolemia, unspecified: Secondary | ICD-10-CM

## 2023-01-09 DIAGNOSIS — I77819 Aortic ectasia, unspecified site: Secondary | ICD-10-CM

## 2023-01-09 DIAGNOSIS — R000 Tachycardia, unspecified: Secondary | ICD-10-CM | POA: Diagnosis not present

## 2023-01-09 MED ORDER — ROSUVASTATIN CALCIUM 10 MG PO TABS
10.0000 mg | ORAL_TABLET | Freq: Every day | ORAL | 3 refills | Status: DC
  Filled 2023-01-09: qty 90, 90d supply, fill #0

## 2023-01-10 ENCOUNTER — Other Ambulatory Visit: Payer: Self-pay | Admitting: Medical

## 2023-01-11 ENCOUNTER — Other Ambulatory Visit (HOSPITAL_BASED_OUTPATIENT_CLINIC_OR_DEPARTMENT_OTHER): Payer: Self-pay

## 2023-01-11 MED ORDER — BENZONATATE 100 MG PO CAPS
100.0000 mg | ORAL_CAPSULE | Freq: Three times a day (TID) | ORAL | 0 refills | Status: DC | PRN
  Filled 2023-01-11: qty 30, 10d supply, fill #0

## 2023-01-11 NOTE — Patient Instructions (Addendum)
Chronic cough DOE (dyspnea on exertion) Ground glass opacity present on imaging of lung Eosinophilia, unspecified type History of seasonal allergies  -Seems at least in part some of his symptoms are related to your pulmonary infiltrates of unclear cause and potential asthma.  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 Worse Nov 2024  Plan  -  [facility] and take it regularly - continue singulari scheduled - Continue albuterol as needed -control GERD - start biologic injection against asthma   - START Dupixent   Dizziness History tachycardia  - ongoing Zio monitor  - reassuing Coronary CT Nov 2024 - consider possible cough pre-syncope  Plan  Per Dr Bjorn Pippin   History of obstructive sleep apnea Morbid obesity  - prior history of wegovy and 70# weight gain after insurance issues  Plan  - refer sleep doctor - CMA to do this 01/12/2023 - lose weight with biologic through PCP Saguier, Ramon Dredge, [name]    Follow-up - Return to see Dr. Marchelle Gearing or nurse practitioner in 8 weeks but after completing all of the above.

## 2023-01-11 NOTE — Progress Notes (Signed)
OV 11/20/2022  Subjective:-History from the patient and also review of the external medical record.  Patient ID: Ryan Holland, male , DOB: 03-11-1970 , age 52 y.o. , MRN: [id number] , ADDRESS: [address] [place] [id number] PCP Saguier, Kateri Mc Patient Care Team: Marisue Brooklyn as PCP - General (Internal Medicine)  This Provider for this visit: Treatment Team:  Attending Provider: Kalman Shan, MD    11/20/2022 -   Chief Complaint  Patient presents with   Pulmonary Consult    Referred by Esperanza Richters, PA. Pt states "have had allergies forever"- increased SOB since July 2024- gets winded sometimes just at rest and with minimal exertion. He has had cough- occ prod with clear sputum.      HPI Lennex Rohm 52 y.o. -works in [employer].  Following 40 pack smoking history but quit.  He tells me that he has a history of chronic cough for many years.  This is diagnosed secondary to allergies.  He used to follow with "P[facility]" at [facility].  Review extramedical records I am not able to find any allergy testing but he states allergy testing was positive.  It appears several years ago he had autoimmune profile the results are not available but I am presuming it is negative.  He was never on any asthma inhalers.  Since July 2024 he had insidious onset of shortness of breath associated with dizziness.  Dizziness happens randomly.  Shortness of breath is present with exertion relieved by rest.  After he went on Trelegy the shortness of breath did get better.  His baseline cough started going up.  Now for the last 1 or 2 weeks he has a dull pain in his right breast area.  He also has noticed some random tingling once or twice on his right arm.  The symptoms are associated with tachycardia particularly the dizziness.  He says even at rest he has tachycardia.  Sometimes the heart rate even at rest is 100-120 [90-99/min today at rest].   Review of the records  indicate that as of June 2024 he was doing okay.  Then on October 26, 2022 he did see primary care office.  For episodes of vertigo.  This is what he says the dizziness.  He points to the sinus area for this.  He was also having hypertension.  He did report his chronic cough.  At this visit he said the [facility] was not helping but today he did tell me the [facility] did help.  This subsequently resulted in a CT angiogram chest to rule out pulmonary embolism but did show a right sided groundglass opacities that I personally visualized and confirmed.  He also had Doppler lower extremities and this was negative.  He was given antibiotics both azithromycin and Z-Pak.  Then on Labor Day weekend he was in the bass outlet at [place].  There when he went outside he got very tachycardic to a heart rate of 145 and also had blurred vision.  He did fall follow-up after this with primary care.  He is due dimer and BNP were normal.  Labs indicate that today exam nitric oxide is normal [possible Breo effect].  His blood eosinophils are consistently high at 400 cells per cubic millimeter.  Primary care is considered but getting a referral to cardiology for Zio patch.  But he is also been referred here.  CT chest in 2016 was normal.  No ACE inhibitor intake.    X-rays hypoxemia  test sit/stand pulse ox at rest was 99% with a heart rate 90-99.  After sitting and standing 10 times he was short of breath 5 out of 10 pulse ox never changed heart rate went up to 110.  He does have sleep apnea he uses his CPAP but has not seen a sleep doctor in many years.   CT Chest data from date: 10/27/22  - personally visualized and independently interpreted : YES - my findings are: AGREE rrative & Impression  CLINICAL DATA:  Shortness of breath with positive D-dimer   EXAM: CT ANGIOGRAPHY CHEST WITH CONTRAST   TECHNIQUE: Multidetector CT imaging of the chest was performed using the standard protocol during bolus administration of  intravenous contrast. Multiplanar CT image reconstructions and MIPs were obtained to evaluate the vascular anatomy.   RADIATION DOSE REDUCTION: This exam was performed according to the departmental dose-optimization program which includes automated exposure control, adjustment of the mA and/or kV according to patient size and/or use of iterative reconstruction technique.   CONTRAST:  OMNIPAQUE IOHEXOL 350 MG/ML SOLN   COMPARISON:  None Available.   FINDINGS: Cardiovascular: No evidence of pulmonary embolus. Normal heart size. No pericardial effusion. Normal caliber thoracic aorta with mild atherosclerotic disease. No coronary artery calcifications.   Mediastinum/Nodes: Esophagus thyroid are unremarkable. No enlarged lymph nodes seen in the chest.   Lungs/Pleura: Central airways are patent. Patchy ground-glass opacities which are most pronounced in the right middle lobe and right upper lobe. No consolidation, pleural effusion or pneumothorax. Small solid pulmonary nodule of the right lower lobe measuring 4 mm on series 301, image 78.   Upper Abdomen: Hepatic steatosis.  No acute abnormality.   Musculoskeletal: No aggressive appearing osseous lesions.   Review of the MIP images confirms the above findings.   IMPRESSION: 1. No evidence of pulmonary embolus. 2. Patchy ground-glass opacities which are most pronounced in the right middle lobe and right upper lobe, likely infectious or inflammatory. 3. Small solid pulmonary nodule of the right lower lobe measuring 4 mm. No follow-up needed if patient is low-risk.This recommendation follows the consensus statement: Guidelines for Management of Incidental Pulmonary Nodules Detected on CT Images: From the Fleischner Society 2017; Radiology 2017; 284:228-243. 4. Hepatic steatosis. 5. Mild aortic Atherosclerosis (ICD10-I70.0).     Electronically Signed   By: Allegra Lai M.D.   On: 10/27/2022 19:41     OV  01/12/2023  Subjective:  Patient ID: Ryan Holland, male , DOB: 11-13-1970 , age 68 y.o. , MRN: [id number] , ADDRESS: [address]  [place] [phone] PCP Saguier, Ramon Dredge [name] Patient Care Team: Marisue Brooklyn as PCP - General (Internal Medicine) Little Ishikawa, MD as Consulting Physician (Cardiology)  This Provider for this visit: Treatment Team:  Attending Provider: Kalman Shan, MD    01/12/2023 -   Chief Complaint  Patient presents with   Follow-up    Ct f/u & pft    Follow-up history of previous allergies but dizziness and worsening of with onset of chronic cough since July 2024: Workup in progress  HPI Urbain Eiler 52 y.o. -returns for follow-up to discuss current status and also discussed results.  Went over the history again he tells me that he has had a strong history of allergy issues.  At [facility] in 2017 skin test was positive for dust mite mold and mildew in fescue grass.  I am not able to get access to this but this is his history.  As a result of this he  had seasonal allergy symptoms especially in the spring and the fall.  This gets manifested by tickle in the throat, sinus drainage, runny eyes and red eyes.  Also some wheezing sebacic basically like hayfever.  He was on allergy shots.  In 2017 and 2020 and then stopped because of lack of change.  And then he was not on follow-up.  Most recently symptoms started in July 2024 when he started having dizziness with this the cough became ongoing.  He was has dry heaves and nausea with the cough this chest tightness there is also dizziness.  He said high risk for sleep apnea.  He had pulmonary infiltrates on CT in August 2024  Progress with issue so far -There is prior history of smoking 1 pack a day for 20 years.  - Pulmonary infiltrates on CT chest October 27, 2022: These are significantly improved on the high-resolution CT chest October 2024.  I personally visualized this and showed it to him.   He did have a subsequent CT coronary morphology on 01/08/2023.  No comment is made of the pulmonary infiltrates but I am wondering if it is back.  I am not so sure.  -Associated acid reflux symptoms: He occasionally has acid reflux but denies any overt aspiration.  -Limited allergy workup blood eosinophils and blood IgE is high   -Pulmonary function test suggest restriction for obesity but otherwise normal.  -Potential sleep apnea: Self has not seen a sleep doctor.  I am making a rereferral today.  He does use CPAP.  -Dizziness is seen Dr. Denzil Magnuson.  Most recent visit 01/03/2023.  Chart reviewed.  Echocardiogram in October 2024 was normal.  ZIO monitor recommended.  -He has morbid obesity 42.3 BMI  Dr Gretta Cool Reflux Symptom Index (> 13-15 suggestive of LPR cough) 0 -> 5  =  none ->severe problem 01/12/2023   Hoarseness of problem with voice 1  Clearing  Of Throat 4  Excess throat mucus or feeling of post nasal drip 4  Difficulty swallowing food, liquid or tablets 0  Cough after eating or lying down 5  Breathing difficulties or choking episodes 4  Troublesome or annoying cough 5  Sensation of something sticking in throat or lump in throat 2  Heartburn, chest pain, indigestion, or stomach acid coming up 4  TOTAL 29    PFT    Latest Ref Rng & Units 01/12/2023    8:50 AM 09/10/2014    9:39 AM  PFT Results  FVC-Pre L 3.47  P 4.20   FVC-Predicted Pre % 68  P 80   FVC-Post L 3.43  P 3.94   FVC-Predicted Post % 67  P 75   Pre FEV1/FVC % % 87  P 85   Post FEV1/FCV % % 90  P 85   FEV1-Pre L 3.02  P 3.57   FEV1-Predicted Pre % 76  P 85   FEV1-Post L 3.07  P 3.37   DLCO uncorrected ml/min/mmHg 28.11  P 27.88   DLCO UNC% % 95  P 86   DLCO corrected ml/min/mmHg 28.11  P   DLCO COR %Predicted % 95  P   DLVA Predicted % 122  P 109   TLC L 5.63  P 5.84   TLC % Predicted % 80  P 84   RV % Predicted % 88  P 87     P Preliminary result    Latest Reference Range & Units  07/24/11 10:55 10/19/11 01:33 10/26/22 15:45 11/02/22 02:23 11/10/22  09:21  Eosinophils Absolute 0.0 - 0.7 K/uL 0.4 0.5 0.2 0.4 0.4    Latest Reference Range & Units 11/20/22 10:06  IgE (Immunoglobulin E), Serum <OR=114 kU/L 612 (H)  (H): Data is abnormally high  arrative & Impression  CLINICAL DATA:  Chronic cough and dyspnea on exertion. Hypertension.   EXAM: CT CHEST WITHOUT CONTRAST   TECHNIQUE: Multidetector CT imaging of the chest was performed following the standard protocol without intravenous contrast. High resolution imaging of the lungs, as well as inspiratory and expiratory imaging, was performed.   RADIATION DOSE REDUCTION: This exam was performed according to the departmental dose-optimization program which includes automated exposure control, adjustment of the mA and/or kV according to patient size and/or use of iterative reconstruction technique.   COMPARISON:  10/27/2022 chest CT angiogram. 11/10/2022 chest radiograph.   FINDINGS: Cardiovascular: Normal heart size. No significant pericardial effusion/thickening. Mildly atherosclerotic nonaneurysmal thoracic aorta. Dilated main pulmonary artery (3.8 cm diameter).   Mediastinum/Nodes: No significant thyroid nodules. Unremarkable esophagus. No pathologically enlarged axillary, mediastinal or hilar lymph nodes, noting limited sensitivity for the detection of hilar adenopathy on this noncontrast study.   Lungs/Pleura: No pneumothorax. No pleural effusion. No acute consolidative airspace disease or lung masses. No significant pulmonary nodules. Previously described 0.4 cm anterior right lower lobe pulmonary nodule on 10/27/2022 chest CT angiogram study is stable and favored to represent a vessel on the medial aspect of the right major fissure. No significant lobular air trapping or evidence of tracheobronchomalacia on the expiration sequence. Mild patchy subpleural ground-glass opacities throughout both lungs  without significant regions of reticulation, traction bronchiectasis, parenchymal banding, architectural distortion or frank honeycombing. The previously visualized patchy peribronchovascular ground-glass opacities on 10/27/2022 chest CT angiogram study have resolved. The residual patchy subpleural ground-glass opacities have not appreciably changed in the interval.   Upper abdomen: Diffuse hepatic steatosis.   Musculoskeletal: No aggressive appearing focal osseous lesions. Moderate thoracic spondylosis.   IMPRESSION: 1. Residual mild patchy subpleural ground-glass opacities throughout both lungs without bronchiectasis or honeycombing. Ground-glass opacities have significantly improved since 10/27/2022 CT. Differential includes slowly resolving infectious or inflammatory process, with NSIP not excluded. Consider follow-up high-resolution chest CT in 12 months, as clinically warranted. 2. Dilated main pulmonary artery, suggesting pulmonary arterial hypertension. 3. Diffuse hepatic steatosis. 4.  Aortic Atherosclerosis (ICD10-I70.0).     Electronically Signed   By: Delbert Phenix M.D.   On: 12/22/2022 09:39     LAB RESULTS last 96 hours No results found.  LAB RESULTS last 90 days Recent Results (from the past 2160 hour(s))  CBC with Differential/Platelet     Status: Abnormal   Collection Time: 10/26/22  3:45 PM  Result Value Ref Range   WBC 8.3 4.0 - 10.5 K/uL   RBC 4.31 4.22 - 5.81 Mil/uL   Hemoglobin 15.1 13.0 - 17.0 g/dL   HCT 13.0 86.5 - 78.4 %   MCV 101.6 (H) 78.0 - 100.0 fl   MCHC 34.4 30.0 - 36.0 g/dL   RDW 69.6 29.5 - 28.4 %   Platelets 247.0 150.0 - 400.0 K/uL   Neutrophils Relative % 69.4 43.0 - 77.0 %   Lymphocytes Relative 17.5 12.0 - 46.0 %   Monocytes Relative 9.7 3.0 - 12.0 %   Eosinophils Relative 2.7 0.0 - 5.0 %   Basophils Relative 0.7 0.0 - 3.0 %   Neutro Abs 5.8 1.4 - 7.7 K/uL   Lymphs Abs 1.5 0.7 - 4.0 K/uL   Monocytes Absolute 0.8 0.1 - 1.0  K/uL   Eosinophils Absolute 0.2 0.0 - 0.7 K/uL   Basophils Absolute 0.1 0.0 - 0.1 K/uL  Comprehensive metabolic panel     Status: Abnormal   Collection Time: 10/26/22  3:45 PM  Result Value Ref Range   Sodium 136 135 - 145 mEq/L   Potassium 4.1 3.5 - 5.1 mEq/L   Chloride 102 96 - 112 mEq/L   CO2 25 19 - 32 mEq/L   Glucose, Bld 123 (H) 70 - 99 mg/dL   BUN 14 6 - 23 mg/dL   Creatinine, Ser 3.01 0.40 - 1.50 mg/dL   Total Bilirubin 1.2 0.2 - 1.2 mg/dL   Alkaline Phosphatase 133 (H) 39 - 117 U/L   AST 97 (H) 0 - 37 U/L   ALT 68 (H) 0 - 53 U/L   Total Protein 7.3 6.0 - 8.3 g/dL   Albumin 3.8 3.5 - 5.2 g/dL   GFR 60.10 >93.23 mL/min    Comment: Calculated using the CKD-EPI Creatinine Equation (2021)   Calcium 9.2 8.4 - 10.5 mg/dL  Lipid panel     Status: Abnormal   Collection Time: 10/26/22  3:45 PM  Result Value Ref Range   Cholesterol 274 (H) 0 - 200 mg/dL    Comment: ATP III Classification       Desirable:  < 200 mg/dL               Borderline High:  200 - 239 mg/dL          High:  > = 557 mg/dL   Triglycerides 322.0 (H) 0.0 - 149.0 mg/dL    Comment: Normal:  <254 mg/dLBorderline High:  150 - 199 mg/dL   HDL 27.06 >23.76 mg/dL   VLDL 28.3 (H) 0.0 - 15.1 mg/dL   Total CHOL/HDL Ratio 5     Comment:                Men          Women1/2 Average Risk     3.4          3.3Average Risk          5.0          4.42X Average Risk          9.6          7.13X Average Risk          15.0          11.0                       NonHDL 214.73     Comment: NOTE:  Non-HDL goal should be 30 mg/dL higher than patient's LDL goal (i.e. LDL goal of < 70 mg/dL, would have non-HDL goal of < 100 mg/dL)  TSH     Status: None   Collection Time: 10/26/22  3:45 PM  Result Value Ref Range   TSH 1.13 0.35 - 5.50 uIU/mL  D-Dimer, Quantitative     Status: Abnormal   Collection Time: 10/26/22  3:45 PM  Result Value Ref Range   D-Dimer, Quant 0.60 (H) <0.50 mcg/mL FEU    Comment: . The D-Dimer test is used frequently  to exclude an acute PE or DVT. In patients with a low to moderate clinical risk assessment and a D-Dimer result <0.50 mcg/mL FEU, the likelihood of a PE or DVT is very low. However, a thromboembolic event should not be excluded solely on the basis of the D-Dimer  level. Increased levels of D-Dimer are associated with a PE, DVT, DIC, malignancies, inflammation, sepsis, surgery, trauma, pregnancy, and advancing patient age. [Jama 2006 11:295(2):199-207] . For additional information, please refer to: http://education.questdiagnostics.com/faq/FAQ149 (This link is being provided for informational/ educational purposes only) .   LDL cholesterol, direct     Status: None   Collection Time: 10/26/22  3:45 PM  Result Value Ref Range   Direct LDL 189.0 mg/dL    Comment: Optimal:  <161 mg/dLNear or Above Optimal:  100-129 mg/dLBorderline High:  130-159 mg/dLHigh:  160-189 mg/dLVery High:  >190 mg/dL  CBC with Differential     Status: Abnormal   Collection Time: 11/02/22  2:23 AM  Result Value Ref Range   WBC 8.1 4.0 - 10.5 K/uL   RBC 4.43 4.22 - 5.81 MIL/uL   Hemoglobin 15.1 13.0 - 17.0 g/dL   HCT 09.6 04.5 - 40.9 %   MCV 100.7 (H) 80.0 - 100.0 fL   MCH 34.1 (H) 26.0 - 34.0 pg   MCHC 33.9 30.0 - 36.0 g/dL   RDW 81.1 91.4 - 78.2 %   Platelets 191 150 - 400 K/uL   nRBC 0.0 0.0 - 0.2 %   Neutrophils Relative % 55 %   Neutro Abs 4.5 1.7 - 7.7 K/uL   Lymphocytes Relative 29 %   Lymphs Abs 2.3 0.7 - 4.0 K/uL   Monocytes Relative 9 %   Monocytes Absolute 0.8 0.1 - 1.0 K/uL   Eosinophils Relative 5 %   Eosinophils Absolute 0.4 0.0 - 0.5 K/uL   Basophils Relative 1 %   Basophils Absolute 0.1 0.0 - 0.1 K/uL   Immature Granulocytes 1 %   Abs Immature Granulocytes 0.06 0.00 - 0.07 K/uL    Comment: Performed at [facility], [address], [place], [place] [address]  Basic metabolic panel     Status: Abnormal   Collection Time: 11/02/22  2:23 AM  Result Value Ref Range    Sodium 138 135 - 145 mmol/L   Potassium 3.6 3.5 - 5.1 mmol/L   Chloride 103 98 - 111 mmol/L   CO2 21 (L) 22 - 32 mmol/L   Glucose, Bld 109 (H) 70 - 99 mg/dL    Comment: Glucose reference range applies only to samples taken after fasting for at least 8 hours.   BUN 10 6 - 20 mg/dL   Creatinine, Ser 3.08 0.61 - 1.24 mg/dL   Calcium 9.2 8.9 - 65.7 mg/dL   GFR, Estimated >84 >69 mL/min    Comment: (NOTE) Calculated using the CKD-EPI Creatinine Equation (2021)    Anion gap 14 5 - 15    Comment: Performed at [facility], [address], [place], [place] [address]  Brain natriuretic peptide     Status: None   Collection Time: 11/02/22  2:23 AM  Result Value Ref Range   B Natriuretic Peptide 32.1 0.0 - 100.0 pg/mL    Comment: Performed at [facility], [address], [place], [place] [address]  Hepatic function panel     Status: Abnormal   Collection Time: 11/02/22  2:23 AM  Result Value Ref Range   Total Protein 7.8 6.5 - 8.1 g/dL   Albumin 3.7 3.5 - 5.0 g/dL   AST 96 (H) 15 - 41 U/L   ALT 73 (H) 0 - 44 U/L   Alkaline Phosphatase 111 38 - 126 U/L   Total Bilirubin 1.2 0.3 - 1.2 mg/dL   Bilirubin, Direct 0.5 (  H) 0.0 - 0.2 mg/dL   Indirect Bilirubin 0.7 0.3 - 0.9 mg/dL    Comment: Performed at [facility], [address], [place], [place] [address]  CBC w/Diff     Status: Abnormal   Collection Time: 11/10/22  9:21 AM  Result Value Ref Range   WBC 5.1 4.0 - 10.5 K/uL   RBC 4.37 4.22 - 5.81 Mil/uL   Hemoglobin 15.1 13.0 - 17.0 g/dL   HCT 19.1 47.8 - 29.5 %   MCV 101.6 (H) 78.0 - 100.0 fl   MCHC 34.0 30.0 - 36.0 g/dL   RDW 62.1 30.8 - 65.7 %   Platelets 166.0 150.0 - 400.0 K/uL   Neutrophils Relative % 61.8 43.0 - 77.0 %   Lymphocytes Relative 19.0 12.0 - 46.0 %   Monocytes Relative 10.4 3.0 - 12.0 %   Eosinophils Relative 7.7 (H) 0.0 - 5.0 %   Basophils Relative 1.1 0.0 - 3.0 %   Neutro Abs 3.2 1.4 - 7.7 K/uL   Lymphs  Abs 1.0 0.7 - 4.0 K/uL   Monocytes Absolute 0.5 0.1 - 1.0 K/uL   Eosinophils Absolute 0.4 0.0 - 0.7 K/uL   Basophils Absolute 0.1 0.0 - 0.1 K/uL  B Nat Peptide     Status: None   Collection Time: 11/10/22  9:21 AM  Result Value Ref Range   Pro B Natriuretic peptide (BNP) 17.0 0.0 - 100.0 pg/mL  D-Dimer, Quantitative     Status: None   Collection Time: 11/13/22  1:50 PM  Result Value Ref Range   D-Dimer, Quant 0.39 <0.50 mcg/mL FEU    Comment: . The D-Dimer test is used frequently to exclude an acute PE or DVT. In patients with a low to moderate clinical risk assessment and a D-Dimer result <0.50 mcg/mL FEU, the likelihood of a PE or DVT is very low. However, a thromboembolic event should not be excluded solely on the basis of the D-Dimer level. Increased levels of D-Dimer are associated with a PE, DVT, DIC, malignancies, inflammation, sepsis, surgery, trauma, pregnancy, and advancing patient age. [Jama 2006 11:295(2):199-207] . For additional information, please refer to: http://education.questdiagnostics.com/faq/FAQ149 (This link is being provided for informational/ educational purposes only) .   Nitric oxide     Status: None   Collection Time: 11/20/22  9:03 AM  Result Value Ref Range   Nitric Oxide 15   IgE     Status: Abnormal   Collection Time: 11/20/22 10:06 AM  Result Value Ref Range   IgE (Immunoglobulin E), Serum 612 (H) <OR=114 kU/L  ECHOCARDIOGRAM COMPLETE     Status: None   Collection Time: 12/11/22 10:28 AM  Result Value Ref Range   Area-P 1/2 4.29 cm2   S' Lateral 3.30 cm   Est EF 60 - 65%   Basic metabolic panel     Status: None   Collection Time: 01/03/23  4:15 PM  Result Value Ref Range   Glucose 77 70 - 99 mg/dL   BUN 11 6 - 24 mg/dL   Creatinine, Ser 8.46 0.76 - 1.27 mg/dL   eGFR 962 >95 MW/UXL/2.44   BUN/Creatinine Ratio 13 9 - 20   Sodium 140 134 - 144 mmol/L   Potassium 4.0 3.5 - 5.2 mmol/L   Chloride 102 96 - 106 mmol/L   CO2 21 20 - 29  mmol/L   Calcium 9.2 8.7 - 10.2 mg/dL  Pulmonary function test     Status: None (Preliminary result)   Collection Time: 01/12/23  8:50 AM  Result Value Ref Range   FVC-Pre 3.47 L   FVC-%Pred-Pre 68 %   FVC-Post 3.43 L   FVC-%Pred-Post 67 %   FVC-%Change-Post -1 %   FEV1-Pre 3.02 L   FEV1-%Pred-Pre 76 %   FEV1-Post 3.07 L   FEV1-%Pred-Post 78 %   FEV1-%Change-Post 1 %   FEV6-Pre 3.47 L   FEV6-%Pred-Pre 70 %   FEV6-Post 3.43 L   FEV6-%Pred-Post 70 %   FEV6-%Change-Post -1 %   Pre FEV1/FVC ratio 87 %   FEV1FVC-%Pred-Pre 112 %   Post FEV1/FVC ratio 90 %   FEV1FVC-%Change-Post 2 %   Pre FEV6/FVC Ratio 100 %   FEV6FVC-%Pred-Pre 103 %   Post FEV6/FVC ratio 100 %   FEV6FVC-%Pred-Post 103 %   FEF 25-75 Pre 4.24 L/sec   FEF2575-%Pred-Pre 122 %   FEF 25-75 Post 4.47 L/sec   FEF2575-%Pred-Post 128 %   FEF2575-%Change-Post 5 %   RV 1.80 L   RV % pred 88 %   TLC 5.63 L   TLC % pred 80 %   DLCO unc 28.11 ml/min/mmHg   DLCO unc % pred 95 %   DLCO cor 28.11 ml/min/mmHg   DLCO cor % pred 95 %   DL/VA 1.61 ml/min/mmHg/L   DL/VA % pred 096 %         has a past medical history of Anxiety, GERD (gastroesophageal reflux disease), HNP (herniated nucleus pulposus), lumbar, Hypertension, and Sleep apnea.   reports that he quit smoking about 11 years ago. His smoking use included cigarettes. He started smoking about 31 years ago. He has a 40 pack-year smoking history. He has quit using smokeless tobacco.  His smokeless tobacco use included chew.  Past Surgical History:  Procedure Laterality Date   01/05/2011 LUMBAR DECOMPRESSION AND MICRODISCECTOMY     CERVICAL FUSION  C4-5      ESOPHAGOGASTRODUODENOSCOPY (EGD) WITH PROPOFOL N/A 12/30/2014   Procedure: ESOPHAGOGASTRODUODENOSCOPY (EGD) WITH PROPOFOL;  Surgeon: Willis Modena, MD;  Location: [facility];  Service: Endoscopy;  Laterality: N/A;  With Bravo   LUMBAR LAMINECTOMY/DECOMPRESSION MICRODISCECTOMY  07/26/2011   Procedure: LUMBAR  LAMINECTOMY/DECOMPRESSION MICRODISCECTOMY;  Surgeon: Javier Docker, MD;  Location: [facility];  Service: Orthopedics;  Laterality: Right;  Re-Do Lumbar Decompression L5-S1 on Right,    PILONIDAL CYST EXCISION      Allergies  Allergen Reactions   Other     LEMON-sores in mouth    Immunization History  Administered Date(s) Administered   Influenza Inj Mdck Quad Pf 11/22/2016   Influenza Whole 12/24/2013, 11/07/2014   Influenza, Seasonal, Injecte, Preservative Fre 11/20/2022   Influenza,inj,Quad PF,6+ Mos 11/08/2017, 01/27/2019, 02/20/2022   PFIZER(Purple Top)SARS-COV-2 Vaccination 06/07/2019, 07/08/2019   Tdap 11/22/2016    Family History  Problem Relation Age of Onset   COPD Father    Congestive Heart Failure Father    Breast cancer Mother      Current Outpatient Medications:    albuterol (PROAIR HFA) 108 (90 Base) MCG/ACT inhaler, Inhale 2 puffs into the lungs every 4 (four) hours as needed for wheezing or shortness of breath., Disp: 18 g, Rfl: 5   benzonatate (TESSALON) 100 MG capsule, Take 1 capsule (100 mg total) by mouth 3 (three) times daily as needed for cough., Disp: 30 capsule, Rfl: 0   cetirizine (ZYRTEC) 10 MG tablet, Take 20 mg by mouth daily., Disp: , Rfl:    citalopram (CELEXA) 40 MG tablet, Take 1 tablet (40 mg total) by mouth daily., Disp: 90 tablet, Rfl:  0   fluticasone (FLONASE) 50 MCG/ACT nasal spray, Place 2 sprays into both nostrils daily., Disp: 16 g, Rfl: 0   fluticasone furoate-vilanterol (BREO ELLIPTA) 200-25 MCG/ACT AEPB, Inhale 1 puff into the lungs daily., Disp: 60 each, Rfl: 5   ipratropium (ATROVENT) 0.03 % nasal spray, Place 2 sprays into both nostrils every 12 (twelve) hours., Disp: 30 mL, Rfl: 0   losartan (COZAAR) 100 MG tablet, Take 1 tablet (100 mg total) by mouth daily., Disp: 90 tablet, Rfl: 0   methocarbamol (ROBAXIN) 500 MG tablet, Take 1 tablet (500 mg total) by mouth every 6 (six) hours as needed for muscle spasms., Disp: 90 tablet, Rfl: 0    montelukast (SINGULAIR) 10 MG tablet, Take 1 tablet (10 mg total) by mouth daily., Disp: 90 tablet, Rfl: 3   pantoprazole (PROTONIX) 40 MG tablet, Take 1 tablet (40 mg total) by mouth daily., Disp: 90 tablet, Rfl: 3   rosuvastatin (CRESTOR) 10 MG tablet, Take 1 tablet (10 mg total) by mouth daily., Disp: 90 tablet, Rfl: 3   Vitamin D, Ergocalciferol, (DRISDOL) 1.25 MG (50000 UNIT) CAPS capsule, Take 1 capsule (50,000 Units total) by mouth every 7 (seven) days., Disp: 8 capsule, Rfl: 0      Objective:   Vitals:   01/12/23 1048  BP: (!) 151/92  Pulse: 100  Temp: 98.1 F (36.7 C)  SpO2: 99%  Weight: 294 lb (133.4 kg)  Height: 5\' 10"  (1.778 m)    Estimated body mass index is 42.18 kg/m as calculated from the following:   Height as of this encounter: 5\' 10"  (1.778 m).   Weight as of this encounter: 294 lb (133.4 kg).  @WEIGHTCHANGE @  [employer]   01/12/23 1048  Weight: 294 lb (133.4 kg)     Physical Exam   General: No distress. Loooks well O2 at rest: no Cane present: no Sitting in wheel chair: no Frail: no Obese: YES Neuro: Alert and Oriented x 3. GCS 15. Speech normal Psych: Pleasant Resp:  Barrel Chest - no.  Wheeze - no, Crackles - no, No overt respiratory distress CVS: Normal heart sounds. Murmurs - no Ext: Stigmata of Connective Tissue Disease - no HEENT: Normal upper airway. PEERL +. No post nasal drip        Assessment:       ICD-10-CM   1. Chronic cough  R05.3     2. DOE (dyspnea on exertion)  R06.09     3. Eosinophilia, unspecified type  D72.10     4. History of seasonal allergies  Z88.9     5. Elevated IgE level  R76.8     6. Dizziness  R42      There is a constellation of various symptoms.  Is very difficult to put it altogether.  However I thought we just tackle them 1 by one and see where things lined up.  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  Will start him on a biologic.  He is on Breo and its helped a little bit.  Will go with the Dupixent.  #Dizziness: He is going through ZIO monitor right now.  I wonder if this is somewhat related to cough in the sense this could be cough related presyncope.  However we will see what the rest of the evaluations lined up.  Controlling his sleep apnea would be helpful.  Could also be increased vagal drive because of his morbid obesity.  #Pulmonary infiltrates unclear the waxing and waning nature of this.  Will continue to monitor this.    Plan:     Patient Instructions  Chronic cough DOE (dyspnea on exertion) Ground glass opacity present on imaging of lung Eosinophilia, unspecified type History of seasonal allergies  -Seems at least in part some of his symptoms are related to your pulmonary infiltrates of unclear cause and potential asthma.  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 Worse Nov 2024  Plan  -  [facility] and take it regularly - continue singulari scheduled - Continue albuterol as needed -control GERD - start biologic injection against asthma   - START Dupixent   Dizziness History tachycardia  - ongoing Zio monitor  - reassuing Coronary CT Nov 2024 - consider possible cough pre-syncope  Plan  Per Dr Bjorn Pippin   History of obstructive sleep apnea Morbid obesity  - prior history of wegovy and 70# weight gain after insurance issues  Plan  - refer sleep doctor - CMA to do this 01/12/2023 - lose weight with biologic through PCP Saguier, Ramon Dredge, [name]    Follow-up - Return to see Dr. Marchelle Gearing or nurse practitioner in 8 weeks but after completing all of the  above.   FOLLOWUP No follow-ups on file.  ( Level 05 visit E&M 2024: Estb >= 40 min  in  visit type: on-site physical face to visit  in total care time and counseling or/and coordination of care by this undersigned MD - Dr Kalman Shan. This includes one or more of the following on this same day 01/12/2023: pre-charting, chart review, note writing, documentation discussion of test results, diagnostic or treatment recommendations, prognosis, risks and benefits of management options, instructions, education, compliance or risk-factor reduction. It excludes time spent by the CMA or office staff in the care of the patient. Actual time 40 min)   SIGNATURE    Dr. Kalman Shan, M.D., F.C.C.P,  Pulmonary and Critical Care Medicine Staff Physician, [facility] Director - Interstitial Lung Disease  Program  Pulmonary Fibrosis [facility] at [facility] [place], [place], [address]  Pager: [phone], If no answer or between  15:00h - 7:00h: call [phone] Telephone: [phone]  1:56 PM 01/12/2023

## 2023-01-12 ENCOUNTER — Encounter: Payer: Self-pay | Admitting: Internal Medicine

## 2023-01-12 ENCOUNTER — Ambulatory Visit (HOSPITAL_BASED_OUTPATIENT_CLINIC_OR_DEPARTMENT_OTHER): Payer: 59 | Admitting: Internal Medicine

## 2023-01-12 ENCOUNTER — Ambulatory Visit: Payer: 59 | Admitting: Internal Medicine

## 2023-01-12 VITALS — BP 151/92 | HR 100 | Temp 98.1°F | Ht 70.0 in | Wt 294.0 lb

## 2023-01-12 DIAGNOSIS — R42 Dizziness and giddiness: Secondary | ICD-10-CM

## 2023-01-12 DIAGNOSIS — R053 Chronic cough: Secondary | ICD-10-CM

## 2023-01-12 DIAGNOSIS — Z889 Allergy status to unspecified drugs, medicaments and biological substances status: Secondary | ICD-10-CM

## 2023-01-12 DIAGNOSIS — D721 Eosinophilia, unspecified: Secondary | ICD-10-CM

## 2023-01-12 DIAGNOSIS — R0609 Other forms of dyspnea: Secondary | ICD-10-CM | POA: Diagnosis not present

## 2023-01-12 DIAGNOSIS — R768 Other specified abnormal immunological findings in serum: Secondary | ICD-10-CM | POA: Diagnosis not present

## 2023-01-12 DIAGNOSIS — R918 Other nonspecific abnormal finding of lung field: Secondary | ICD-10-CM

## 2023-01-12 LAB — PULMONARY FUNCTION TEST
DL/VA % pred: 122 %
DL/VA: 5.44 ml/min/mmHg/L
DLCO cor % pred: 95 %
DLCO cor: 28.11 ml/min/mmHg
DLCO unc % pred: 95 %
DLCO unc: 28.11 ml/min/mmHg
FEF 25-75 Post: 4.47 L/s
FEF 25-75 Pre: 4.24 L/s
FEF2575-%Change-Post: 5 %
FEF2575-%Pred-Post: 128 %
FEF2575-%Pred-Pre: 122 %
FEV1-%Change-Post: 1 %
FEV1-%Pred-Post: 78 %
FEV1-%Pred-Pre: 76 %
FEV1-Post: 3.07 L
FEV1-Pre: 3.02 L
FEV1FVC-%Change-Post: 2 %
FEV1FVC-%Pred-Pre: 112 %
FEV6-%Change-Post: -1 %
FEV6-%Pred-Post: 70 %
FEV6-%Pred-Pre: 70 %
FEV6-Post: 3.43 L
FEV6-Pre: 3.47 L
FEV6FVC-%Pred-Post: 103 %
FEV6FVC-%Pred-Pre: 103 %
FVC-%Change-Post: -1 %
FVC-%Pred-Post: 67 %
FVC-%Pred-Pre: 68 %
FVC-Post: 3.43 L
FVC-Pre: 3.47 L
Post FEV1/FVC ratio: 90 %
Post FEV6/FVC ratio: 100 %
Pre FEV1/FVC ratio: 87 %
Pre FEV6/FVC Ratio: 100 %
RV % pred: 88 %
RV: 1.8 L
TLC % pred: 80 %
TLC: 5.63 L

## 2023-01-12 NOTE — Progress Notes (Signed)
Full PFT Performed Today  

## 2023-01-12 NOTE — Patient Instructions (Signed)
Full PFT Performed Today  

## 2023-01-15 ENCOUNTER — Telehealth: Payer: Self-pay | Admitting: Pharmacist

## 2023-01-15 NOTE — Telephone Encounter (Addendum)
Clinical questions for Dupixent PA completed but CMM is still having technical issue  Chesley Mires, PharmD, MPH, BCPS, CPP Clinical Pharmacist (Rheumatology and Pulmonology)

## 2023-01-15 NOTE — Telephone Encounter (Addendum)
Received new start paperwork for Dupixent. Submitted a Prior Authorization request to [facility] for DUPIXENT via CoverMyMeds. Pending clinical questions to populate with CMM technical issues  Key: [id number]  Chesley Mires, PharmD, MPH, BCPS, CPP Clinical Pharmacist (Rheumatology and Pulmonology)

## 2023-01-22 ENCOUNTER — Other Ambulatory Visit (HOSPITAL_COMMUNITY): Payer: Self-pay

## 2023-01-22 NOTE — Telephone Encounter (Addendum)
Received notification from [facility] regarding a prior authorization for DUPIXENT. Authorization has been APPROVED from 01/18/23 to 02/17/23. Approval letter sent to scan center.  Per test claim, copay for 28 days supply is $1000  Patient must fill through [facility]: [phone]   PA # [id number] - 8mL for 28 days from 01/18/23 - 02/17/23 PA # [id number] - 4mL for 28 days from 02/24/2023 through 05/25/2023  Activated Dupixent copay card via phone: ID: [id number] BIN: [id number] PCN: [id number] Group: [id number]  ATC patient to schedule Dupixent new start. Unable to reach and VM box is not set up  Chesley Mires, PharmD, MPH, BCPS, CPP Clinical Pharmacist (Rheumatology and Pulmonology)

## 2023-01-23 DIAGNOSIS — R0609 Other forms of dyspnea: Secondary | ICD-10-CM | POA: Diagnosis not present

## 2023-01-23 DIAGNOSIS — R000 Tachycardia, unspecified: Secondary | ICD-10-CM | POA: Diagnosis not present

## 2023-01-23 NOTE — Telephone Encounter (Signed)
Patient scheduled for Dupixent new start tomorrow 01/24/2023  Chesley Mires, PharmD, MPH, BCPS, CPP Clinical Pharmacist (Rheumatology and Pulmonology)

## 2023-01-24 ENCOUNTER — Ambulatory Visit: Payer: 59 | Admitting: Pharmacist

## 2023-01-24 ENCOUNTER — Other Ambulatory Visit: Payer: Self-pay | Admitting: Pharmacist

## 2023-01-24 DIAGNOSIS — J4550 Severe persistent asthma, uncomplicated: Secondary | ICD-10-CM

## 2023-01-24 DIAGNOSIS — D721 Eosinophilia, unspecified: Secondary | ICD-10-CM

## 2023-01-24 DIAGNOSIS — Z7189 Other specified counseling: Secondary | ICD-10-CM

## 2023-01-24 MED ORDER — DUPIXENT 300 MG/2ML ~~LOC~~ SOAJ
300.0000 mg | SUBCUTANEOUS | 1 refills | Status: DC
  Filled 2023-01-29: qty 4, 28d supply, fill #0

## 2023-01-24 NOTE — Patient Instructions (Addendum)
Your next Dupixent dose is due on 02/07/23, 02/21/23, and every 14 days thereafter  CONTINUE Breo Ellipta 200-42mcg (1 puff once daily) and montelukast 10mg  nightly  Your prescription will be shipped from [facility]. Their phone number is [phone] Mills Koller will call to schedule shipment and confirm address. They will mail your medication to your home.  You will need to be seen by your provider in 3 to 4 months to assess how Dupixent is working for you. Please ensure you have a follow-up appointment scheduled in February or March 2025. Call our clinic if you need to make this appointment.  How to manage an injection site reaction: Remember the 5 C's: COUNTER - leave on the counter at least 30 minutes but up to overnight to bring medication to room temperature. This may help prevent stinging COLD - place something cold (like an ice gel pack or cold water bottle) on the injection site just before cleansing with alcohol. This may help reduce pain CLARITIN - use Claritin (generic name is loratadine) for the first two weeks of treatment or the day of, the day before, and the day after injecting. This will help to minimize injection site reactions CORTISONE CREAM - apply if injection site is irritated and itching CALL ME - if injection site reaction is bigger than the size of your fist, looks infected, blisters, or if you develop hives

## 2023-01-24 NOTE — Progress Notes (Signed)
HPI Patient presents today to [facility] to see pharmacy team for Dupixent new start for eosinophilic asthma.  History of OSA, allergic rhinitis, and Vitamin D deficiency.  Patient reports possible eczema history (but unclear based on problem list in chart)  Last seen by Dr. Marchelle Gearing on 01/12/2023 Reports increased use of albuterol recently - at least once every morning and during day if increased wheezing  Respiratory Medications Current regimen: Breo Ellipta 200-50mcg (1 puff once daily), montelukast 10mg  nightly  Patient reports no known adherence challenges  OBJECTIVE Allergies  Allergen Reactions   Other     LEMON-sores in mouth    Outpatient Encounter Medications as of 01/24/2023  Medication Sig Note   albuterol (PROAIR HFA) 108 (90 Base) MCG/ACT inhaler Inhale 2 puffs into the lungs every 4 (four) hours as needed for wheezing or shortness of breath.    benzonatate (TESSALON) 100 MG capsule Take 1 capsule (100 mg total) by mouth 3 (three) times daily as needed for cough.    cetirizine (ZYRTEC) 10 MG tablet Take 20 mg by mouth daily.    citalopram (CELEXA) 40 MG tablet Take 1 tablet (40 mg total) by mouth daily.    diphenhydrAMINE (BENADRYL) 25 mg capsule Take 50 mg by mouth in the morning.    Dupilumab (DUPIXENT) 300 MG/2ML SOAJ Inject 300 mg into the skin every 14 (fourteen) days.    fluticasone (FLONASE) 50 MCG/ACT nasal spray Place 2 sprays into both nostrils daily.    fluticasone furoate-vilanterol (BREO ELLIPTA) 200-25 MCG/ACT AEPB Inhale 1 puff into the lungs daily.    ipratropium (ATROVENT) 0.03 % nasal spray Place 2 sprays into both nostrils every 12 (twelve) hours.    losartan (COZAAR) 100 MG tablet Take 1 tablet (100 mg total) by mouth daily.    methocarbamol (ROBAXIN) 500 MG tablet Take 1 tablet (500 mg total) by mouth every 6 (six) hours as needed for muscle spasms.    montelukast (SINGULAIR) 10 MG tablet Take 1 tablet (10 mg total) by mouth daily.     pantoprazole (PROTONIX) 40 MG tablet Take 1 tablet (40 mg total) by mouth daily.    rosuvastatin (CRESTOR) 10 MG tablet Take 1 tablet (10 mg total) by mouth daily.    Vitamin D, Ergocalciferol, (DRISDOL) 1.25 MG (50000 UNIT) CAPS capsule Take 1 capsule (50,000 Units total) by mouth every 7 (seven) days. 11/02/2022: Need Refills   No facility-administered encounter medications on file as of 01/24/2023.     Immunization History  Administered Date(s) Administered   Influenza Inj Mdck Quad Pf 11/22/2016   Influenza Whole 12/24/2013, 11/07/2014   Influenza, Seasonal, Injecte, Preservative Fre 11/20/2022   Influenza,inj,Quad PF,6+ Mos 11/08/2017, 01/27/2019, 02/20/2022   PFIZER(Purple Top)SARS-COV-2 Vaccination 06/07/2019, 07/08/2019   Tdap 11/22/2016     PFTs    Latest Ref Rng & Units 01/12/2023    8:50 AM 09/10/2014    9:39 AM  PFT Results  FVC-Pre L 3.47  P 4.20   FVC-Predicted Pre % 68  P 80   FVC-Post L 3.43  P 3.94   FVC-Predicted Post % 67  P 75   Pre FEV1/FVC % % 87  P 85   Post FEV1/FCV % % 90  P 85   FEV1-Pre L 3.02  P 3.57   FEV1-Predicted Pre % 76  P 85   FEV1-Post L 3.07  P 3.37   DLCO uncorrected ml/min/mmHg 28.11  P 27.88   DLCO UNC% % 95  P 86   DLCO  corrected ml/min/mmHg 28.11  P   DLCO COR %Predicted % 95  P   DLVA Predicted % 122  P 109   TLC L 5.63  P 5.84   TLC % Predicted % 80  P 84   RV % Predicted % 88  P 87     P Preliminary result    Eosinophils Most recent blood eosinophil count was 400 cells/microL taken on 11/10/2022.   IgE: 612 on 11/20/2022  Assessment   Biologics training for dupilumab (Dupixent)  Goals of therapy: Mechanism: human monoclonal IgG4 antibody that inhibits interleukin-4 and interleukin-13 cytokine-induced responses, including release of proinflammatory cytokines, chemokines, and IgE Reviewed that Dupixent is add-on medication and patient must continue maintenance inhaler regimen. Response to therapy: may take 4 months to  determine efficacy. Discussed that patients generally feel improvement sooner than 4 months.  Side effects: injection site reaction (6-18%), antibody development (5-16%), ophthalmic conjunctivitis (2-16%), transient blood eosinophilia (1-2%)  Dose: 600mg  at Week 0 (administered today in clinic) followed by 300mg  every 14 days thereafter  Administration/Storage:  Reviewed administration sites of thigh or abdomen (at least 2-3 inches away from abdomen). Reviewed the upper arm is only appropriate if caregiver is administering injection  Do not shake pen as this could lead to product foaming or precipitation. Do not use if solution is discolored or contains particulate matter or if window on prefilled pen is yellow (indicates pen has been used).  Reviewed storage of medication in refrigerator. Reviewed that Dupixent can be stored at room temperature in unopened carton for up to 14 days.  Access: Approval of Dupixent through: insurance Patient enrolled into copay card program to help with copay assistance.  Patient self-administered Dupixent 300mg /8ml x 2 (total dose 600mg ) in right abdomen and left abdomen using sample Dupixent 300mg /44mL autoinjector pen NDC: [phone] 4ALot: [id number] Expiration: 07/03/2024  Patient monitored for 30 minutes for adverse reaction.  Patient tolerated well.  Injection site noted. Patient denies itchiness and irritation at injection., No significant swelling or redness noted., and Reviewed injection site reaction management with patient verbally and printed information for review in AVS  Medication Reconciliation  A drug regimen assessment was performed, including review of allergies, interactions, disease-state management, dosing and immunization history. Medications were reviewed with the patient, including name, instructions, indication, goals of therapy, potential side effects, importance of adherence, and safe use.  Drug interaction(s): none  noted  PLAN Continue Dupixent 300mg  every 14 days.  Next dose is due 02/07/23 and every 14 days thereafter. Rx sent to: [facility]: [id number] .  Patient provided with pharmacy phone number and advised that Mills Koller will call to schedule first shipment to home.  Continue maintenance asthma regimen of: Breo Ellipta 200-2mcg (1 puff once daily), montelukast 10mg  nightly  All questions encouraged and answered.  Instructed patient to reach out with any further questions or concerns.  Thank you for allowing pharmacy to participate in this patient's care.  This appointment required 45 minutes of patient care (this includes precharting, chart review, review of results, face-to-face care, etc.).  Chesley Mires, PharmD, MPH, BCPS, CPP Clinical Pharmacist (Rheumatology and Pulmonology)

## 2023-01-24 NOTE — Progress Notes (Unsigned)
Patient completed first dose in clinic on 01/24/2023. He was quite confident with self-injecting medication. No issues with technique. Tolerated quite well - no redness, itchiness, or swelling at injection site  Reviewed storage, alternating injection sites, and injection site reaction management with patient  Dose: 300mg  SQ every 14 days  Chesley Mires, PharmD, MPH, BCPS, CPP Clinical Pharmacist (Rheumatology and Pulmonology)

## 2023-01-29 ENCOUNTER — Other Ambulatory Visit: Payer: Self-pay | Admitting: Medical

## 2023-01-29 ENCOUNTER — Other Ambulatory Visit: Payer: Self-pay

## 2023-01-29 ENCOUNTER — Other Ambulatory Visit (HOSPITAL_COMMUNITY): Payer: Self-pay

## 2023-01-29 DIAGNOSIS — F339 Major depressive disorder, recurrent, unspecified: Secondary | ICD-10-CM

## 2023-01-29 DIAGNOSIS — I10 Essential (primary) hypertension: Secondary | ICD-10-CM

## 2023-01-29 MED ORDER — LOSARTAN POTASSIUM 100 MG PO TABS
100.0000 mg | ORAL_TABLET | Freq: Every day | ORAL | 0 refills | Status: DC
  Filled 2023-01-29: qty 90, 90d supply, fill #0

## 2023-01-29 MED ORDER — CITALOPRAM HYDROBROMIDE 40 MG PO TABS
40.0000 mg | ORAL_TABLET | Freq: Every day | ORAL | 0 refills | Status: DC
  Filled 2023-01-29: qty 90, 90d supply, fill #0

## 2023-01-29 NOTE — Progress Notes (Signed)
Specialty Pharmacy Initial Fill Coordination Note  Ryan Holland is a 52 y.o. male contacted today regarding initial fill of specialty medication(s) Dupilumab   Patient requested Delivery   Delivery date: 01/31/23   Verified address: [address]   [place]  [address]   Medication will be filled on 01/30/23.   Copay card info added to [facility], patient is aware of $0 copayment.

## 2023-01-30 ENCOUNTER — Other Ambulatory Visit (HOSPITAL_COMMUNITY): Payer: Self-pay

## 2023-01-30 ENCOUNTER — Other Ambulatory Visit: Payer: Self-pay

## 2023-02-05 ENCOUNTER — Other Ambulatory Visit: Payer: Self-pay

## 2023-02-05 ENCOUNTER — Ambulatory Visit: Payer: 59 | Attending: Medical | Admitting: Pharmacist

## 2023-02-05 ENCOUNTER — Other Ambulatory Visit (HOSPITAL_COMMUNITY): Payer: Self-pay

## 2023-02-05 DIAGNOSIS — J4550 Severe persistent asthma, uncomplicated: Secondary | ICD-10-CM

## 2023-02-05 DIAGNOSIS — D721 Eosinophilia, unspecified: Secondary | ICD-10-CM

## 2023-02-05 DIAGNOSIS — Z79899 Other long term (current) drug therapy: Secondary | ICD-10-CM

## 2023-02-05 MED ORDER — DUPIXENT 300 MG/2ML ~~LOC~~ SOAJ
300.0000 mg | SUBCUTANEOUS | 1 refills | Status: DC
  Filled 2023-02-06: qty 12, 84d supply, fill #0
  Filled 2023-02-14: qty 4, 28d supply, fill #0
  Filled 2023-03-27: qty 4, 28d supply, fill #1
  Filled 2023-04-27: qty 4, 28d supply, fill #2
  Filled 2023-05-24: qty 4, 28d supply, fill #3
  Filled 2023-06-26: qty 4, 28d supply, fill #4
  Filled 2023-07-25: qty 4, 28d supply, fill #5

## 2023-02-05 NOTE — Progress Notes (Signed)
   S: Patient presents for review of their specialty medication therapy.  Patient is currently taking Dupixent for asthma. Patient is managed by Dr. Marchelle Gearing for this.   Adherence: confirms. Initial injection ~2 weeks ago.   Efficacy: already notes improvement.   Dosing: 600 mg LD x1 followed by 300 mg subcutaneous once every 14 days.   Dose adjustments: Renal: no dose adjustments (has not been studied) Hepatic: no dose adjustments (has not been studied)  Drug-drug interactions:none noted   Monitoring: S/sx of infection: none S/sx of hypersensitivity: none S/sx of ocular effects: none S/sx of eosinophilia/vasculitis: none  O:     Lab Results  Component Value Date   WBC 5.1 11/10/2022   HGB 15.1 11/10/2022   HCT 44.4 11/10/2022   MCV 101.6 (H) 11/10/2022   PLT 166.0 11/10/2022      Chemistry      Component Value Date/Time   NA 140 01/03/2023 1615   K 4.0 01/03/2023 1615   CL 102 01/03/2023 1615   CO2 21 01/03/2023 1615   BUN 11 01/03/2023 1615   CREATININE 0.85 01/03/2023 1615      Component Value Date/Time   CALCIUM 9.2 01/03/2023 1615   ALKPHOS 111 11/02/2022 0223   AST 96 (H) 11/02/2022 0223   ALT 73 (H) 11/02/2022 0223   BILITOT 1.2 11/02/2022 0223   BILITOT 0.7 04/19/2020 1028       A/P: 1. Medication review: Patient currently on Dupixent for asthma. Reviewed the medication with the patient, including the following: Dupixent is a monoclonal antibody used for the treatment of asthma or atopic dermatitis. Patient educated on purpose, proper use and potential adverse effects of Dupixent. Possible adverse effects include increased risk of infection, ocular effects, vasculitis/eosinophilia, and hypersensitivity reactions. Administer as a SubQ injection and rotate sites. Allow the medication to reach room temp prior to administration (45 mins for 300 mg syringe or 30 min for 200 mg syringe). Do not shake. Discard any unused portion. No recommendations for any  changes.    Butch Penny, PharmD, Patsy [place], CPP Clinical Pharmacist [facility] [phone]

## 2023-02-06 ENCOUNTER — Other Ambulatory Visit: Payer: Self-pay

## 2023-02-14 ENCOUNTER — Other Ambulatory Visit (HOSPITAL_COMMUNITY): Payer: Self-pay

## 2023-02-14 ENCOUNTER — Other Ambulatory Visit: Payer: Self-pay

## 2023-02-14 ENCOUNTER — Other Ambulatory Visit (HOSPITAL_COMMUNITY): Payer: Self-pay | Admitting: Pharmacy Technician

## 2023-02-14 NOTE — Progress Notes (Signed)
Specialty Pharmacy Refill Coordination Note  Ryan Holland is a 52 y.o. male contacted today regarding refills of specialty medication(s) Dupilumab  Spoke with Wife  Patient requested Courier to Provider Office   Delivery date: 02/26/23   Verified address: [address] [name], [place]   Medication will be filled on 02/26/23.

## 2023-02-16 NOTE — Telephone Encounter (Signed)
Pt called stated he feels better and doesn't want an OV , made him aware if he starts feeling bad again he will call our office and make an appt

## 2023-02-24 ENCOUNTER — Other Ambulatory Visit (HOSPITAL_COMMUNITY): Payer: Self-pay

## 2023-02-24 ENCOUNTER — Other Ambulatory Visit: Payer: Self-pay | Admitting: Medical

## 2023-02-24 DIAGNOSIS — G8929 Other chronic pain: Secondary | ICD-10-CM

## 2023-02-26 ENCOUNTER — Other Ambulatory Visit (HOSPITAL_COMMUNITY): Payer: Self-pay

## 2023-02-26 ENCOUNTER — Other Ambulatory Visit: Payer: Self-pay

## 2023-02-26 MED ORDER — METHOCARBAMOL 500 MG PO TABS
500.0000 mg | ORAL_TABLET | Freq: Three times a day (TID) | ORAL | 0 refills | Status: DC | PRN
  Filled 2023-02-26 – 2023-02-27 (×2): qty 90, 30d supply, fill #0

## 2023-02-26 NOTE — Telephone Encounter (Signed)
I did talk with patient today and will and sent in Robaxin for lower back spasms. I had received the CT chest report in the recent past and wanted to see him in office to review the possible right lower lung opacities.  Medical assistant contacted patient and he declined appointment.  Patient currently states he is not having fevers chills or sweats(some occasional cough).  Describes breathing is overall stable he has seen his pulmonologist in the past and I saw pulmonologist notes regarding the opacities.  Patient states he is traveling over the holidays but he is willing to come in for appointment within the first 2 weeks of the year.  Presently decided not to treat with antibiotics based on his stable status. Pt tells me he thinks not needed and declined.  I did ask him to go ahead and schedule appointment with me within the first 2 weeks of January.  I asked him to schedule early morning and come in fasting so we can go into wellness exam and I will listen to his lungs at that time as well.  Can see him sooner if needed.

## 2023-02-27 ENCOUNTER — Other Ambulatory Visit (HOSPITAL_BASED_OUTPATIENT_CLINIC_OR_DEPARTMENT_OTHER): Payer: Self-pay

## 2023-02-27 ENCOUNTER — Other Ambulatory Visit: Payer: Self-pay

## 2023-02-27 ENCOUNTER — Other Ambulatory Visit (HOSPITAL_COMMUNITY): Payer: Self-pay

## 2023-03-01 ENCOUNTER — Other Ambulatory Visit: Payer: Self-pay

## 2023-03-14 DIAGNOSIS — E7800 Pure hypercholesterolemia, unspecified: Secondary | ICD-10-CM | POA: Diagnosis not present

## 2023-03-15 ENCOUNTER — Ambulatory Visit: Payer: 59 | Admitting: Internal Medicine

## 2023-03-15 LAB — HEPATIC FUNCTION PANEL
ALT: 71 [IU]/L — ABNORMAL HIGH (ref 0–44)
AST: 143 [IU]/L — ABNORMAL HIGH (ref 0–40)
Albumin: 4.2 g/dL (ref 4.1–5.1)
Alkaline Phosphatase: 212 [IU]/L — ABNORMAL HIGH (ref 44–121)
Bilirubin Total: 1.9 mg/dL — ABNORMAL HIGH (ref 0.0–1.2)
Bilirubin, Direct: 1.18 mg/dL — ABNORMAL HIGH (ref 0.00–0.40)
Total Protein: 7.4 g/dL (ref 6.0–8.5)

## 2023-03-15 LAB — LIPID PANEL
Chol/HDL Ratio: 4.3 {ratio} (ref 0.0–5.0)
Cholesterol, Total: 227 mg/dL — ABNORMAL HIGH (ref 100–199)
HDL: 53 mg/dL (ref >39)
LDL Chol Calc (NIH): 144 mg/dL — ABNORMAL HIGH (ref 0–99)
Triglycerides: 166 mg/dL — ABNORMAL HIGH (ref 0–149)
VLDL Cholesterol Cal: 30 mg/dL (ref 5–40)

## 2023-03-15 NOTE — Progress Notes (Deleted)
 OV 11/20/2022  Subjective:-History from the patient and also review of the external medical record.  Patient ID: Ryan Holland, male , DOB: 07/27/1970 , age 53 y.o. , MRN: [id number] , ADDRESS: [address] [place] [id number] PCP Saguier, [place] RIGGERS Patient Care Team: Saguier, Edward, [name] as PCP - General (Internal Medicine)  This Provider for this visit: Treatment Team:  Attending Provider: Geronimo Amel, MD    11/20/2022 -   Chief Complaint  Patient presents with   Pulmonary Consult    Referred by [place] Maxwell, PA. Pt states have had allergies forever- increased SOB since July 2024- gets winded sometimes just at rest and with minimal exertion. He has had cough- occ prod with clear sputum.      HPI Ryan Holland 53 y.o. -works in [employer].  Following 40 pack smoking history but quit.  He tells me that he has a history of chronic cough for many years.  This is diagnosed secondary to allergies.  He used to follow with [facility].  Review extramedical records I am not able to find any allergy testing but he states allergy testing was positive.  It appears several years ago he had autoimmune profile the results are not available but I am presuming it is negative.  He was never on any asthma inhalers.  Since July 2024 he had insidious onset of shortness of breath associated with dizziness.  Dizziness happens randomly.  Shortness of breath is present with exertion relieved by rest.  After he went on Trelegy the shortness of breath did get better.  His baseline cough started going up.  Now for the last 1 or 2 weeks he has a dull pain in his right breast area.  He also has noticed some random tingling once or twice on his right arm.  The symptoms are associated with tachycardia particularly the dizziness.  He says even at rest he has tachycardia.  Sometimes the heart rate even at rest is 100-120 [90-99/min today at rest].   Review of the  records indicate that as of June 2024 he was doing okay.  Then on October 26, 2022 he did see primary care office.  For episodes of vertigo.  This is what he says the dizziness.  He points to the sinus area for this.  He was also having hypertension.  He did report his chronic cough.  At this visit he said the [facility] was not helping but today he did tell me the [facility] did help.  This subsequently resulted in a CT angiogram chest to rule out pulmonary embolism but did show a right sided groundglass opacities that I personally visualized and confirmed.  He also had Doppler lower extremities and this was negative.  He was given antibiotics both azithromycin  and Z-Pak.  Then on Labor Day weekend he was in the bass outlet at [place].  There when he went outside he got very tachycardic to a heart rate of 145 and also had blurred vision.  He did fall follow-up after this with primary care.  He is due dimer and BNP were normal.  Labs indicate that today exam nitric oxide  is normal [possible Breo effect].  His blood eosinophils are consistently high at 400 cells per cubic millimeter.  Primary care is considered but getting a referral to cardiology for Zio patch.  But he is also been referred here.  CT chest in 2016 was normal.  No ACE inhibitor intake.    X-rays  hypoxemia test sit/stand pulse ox at rest was 99% with a heart rate 90-99.  After sitting and standing 10 times he was short of breath 5 out of 10 pulse ox never changed heart rate went up to 110.  He does have sleep apnea he uses his CPAP but has not seen a sleep doctor in many years.   CT Chest data from date: 10/27/22  - personally visualized and independently interpreted : YES - my findings are: AGREE rrative & Impression  CLINICAL DATA:  Shortness of breath with positive D-dimer   EXAM: CT ANGIOGRAPHY CHEST WITH CONTRAST   TECHNIQUE: Multidetector CT imaging of the chest was performed using the standard protocol during bolus administration of  intravenous contrast. Multiplanar CT image reconstructions and MIPs were obtained to evaluate the vascular anatomy.   RADIATION DOSE REDUCTION: This exam was performed according to the departmental dose-optimization program which includes automated exposure control, adjustment of the mA and/or kV according to patient size and/or use of iterative reconstruction technique.   CONTRAST:  OMNIPAQUE  IOHEXOL  350 MG/ML SOLN   COMPARISON:  None Available.   FINDINGS: Cardiovascular: No evidence of pulmonary embolus. Normal heart size. No pericardial effusion. Normal caliber thoracic aorta with mild atherosclerotic disease. No coronary artery calcifications.   Mediastinum/Nodes: Esophagus thyroid  are unremarkable. No enlarged lymph nodes seen in the chest.   Lungs/Pleura: Central airways are patent. Patchy ground-glass opacities which are most pronounced in the right middle lobe and right upper lobe. No consolidation, pleural effusion or pneumothorax. Small solid pulmonary nodule of the right lower lobe measuring 4 mm on series 301, image 78.   Upper Abdomen: Hepatic steatosis.  No acute abnormality.   Musculoskeletal: No aggressive appearing osseous lesions.   Review of the MIP images confirms the above findings.   IMPRESSION: 1. No evidence of pulmonary embolus. 2. Patchy ground-glass opacities which are most pronounced in the right middle lobe and right upper lobe, likely infectious or inflammatory. 3. Small solid pulmonary nodule of the right lower lobe measuring 4 mm. No follow-up needed if patient is low-risk.This recommendation follows the consensus statement: Guidelines for Management of Incidental Pulmonary Nodules Detected on CT Images: From the Fleischner Society 2017; Radiology 2017; 284:228-243. 4. Hepatic steatosis. 5. Mild aortic Atherosclerosis (ICD10-I70.0).     Electronically Signed   By: Rea Marc M.D.   On: 10/27/2022 19:41     OV  01/12/2023  Subjective:  Patient ID: Ryan Holland, male , DOB: [birth year] , age 89 y.o. , MRN: [id number] , ADDRESS: [address] [place] [id number] PCP Saguier, [place] [name] Patient Care Team: [name] RIGGERS as PCP - General (Internal Medicine) Kate Lonni CROME, MD as Consulting Physician (Cardiology)  This Provider for this visit: Treatment Team:  Attending Provider: Geronimo Amel, MD    01/12/2023 -   Chief Complaint  Patient presents with   Follow-up    Ct f/u & pft    Follow-up history of previous allergies but dizziness and worsening of with onset of chronic cough since July 2024: Workup in progress  HPI Clenton Esper 53 y.o. -returns for follow-up to discuss current status and also discussed results.  Went over the history again he tells me that he has had a strong history of allergy issues.  At [facility] in 2017 skin test was positive for dust mite mold and mildew in fescue grass.  I am not able to get access to this but this is his history.  As a result of this  he had seasonal allergy symptoms especially in the spring and the fall.  This gets manifested by tickle in the throat, sinus drainage, runny eyes and red eyes.  Also some wheezing sebacic basically like hayfever.  He was on allergy shots.  In 2017 and 2020 and then stopped because of lack of change.  And then he was not on follow-up.  Most recently symptoms started in July 2024 when he started having dizziness with this the cough became ongoing.  He was has dry heaves and nausea with the cough this chest tightness there is also dizziness.  He said high risk for sleep apnea.  He had pulmonary infiltrates on CT in August 2024  Progress with issue so far -There is prior history of smoking 1 pack a day for 20 years.  - Pulmonary infiltrates on CT chest October 27, 2022: These are significantly improved on the high-resolution CT chest October 2024.  I personally visualized this and showed it to him.   He did have a subsequent CT coronary morphology on 01/08/2023.  No comment is made of the pulmonary infiltrates but I am wondering if it is back.  I am not so sure.  -Associated acid reflux symptoms: He occasionally has acid reflux but denies any overt aspiration.  -Limited allergy workup blood eosinophils and blood IgE is high   -Pulmonary function test suggest restriction for obesity but otherwise normal.  -Potential sleep apnea: Self has not seen a sleep doctor.  I am making a rereferral today.  He does use CPAP.  -Dizziness is seen Dr. Lonni Mas.  Most recent visit 01/03/2023.  Chart reviewed.  Echocardiogram in October 2024 was normal.  ZIO monitor recommended.  -He has morbid obesity 42.3 BMIOV 03/15/2023  Subjective:  Patient ID: Ryan Holland, male , DOB: [birth year] , age 75 y.o. , MRN: [id number] , ADDRESS: [address] [place] [id number] PCP Saguier, [place], [name] Patient Care Team: [name] RIGGERS as PCP - General (Internal Medicine) Kate Lonni CROME, MD as Consulting Physician (Cardiology)  This Provider for this visit: Treatment Team:  Attending Provider: Geronimo Amel, MD    03/15/2023 -  No chief complaint on file.    HPI Damaris Geers 53 y.o. -      Dr Felice Reflux Symptom Index (> 13-15 suggestive of LPR cough) 0 -> 5  =  none ->severe problem 01/12/2023   Hoarseness of problem with voice 1  Clearing  Of Throat 4  Excess throat mucus or feeling of post nasal drip 4  Difficulty swallowing food, liquid or tablets 0  Cough after eating or lying down 5  Breathing difficulties or choking episodes 4  Troublesome or annoying cough 5  Sensation of something sticking in throat or lump in throat 2  Heartburn, chest pain, indigestion, or stomach acid coming up 4  TOTAL 29     CT Chest data from date: ****  - personally visualized and independently interpreted : *** - my findings are: ***   PFT     Latest Ref Rng &  Units 01/12/2023    8:50 AM 09/10/2014    9:39 AM  ILD indicators  FVC-Pre L 3.47  4.20   FVC-Predicted Pre % 68  80   FVC-Post L 3.43  3.94   FVC-Predicted Post % 67  75   TLC L 5.63  5.84   TLC Predicted % 80  84   DLCO uncorrected ml/min/mmHg 28.11  27.88   DLCO UNC %Pred % 95  86   DLCO Corrected ml/min/mmHg 28.11    DLCO COR %Pred % 95        LAB RESULTS last 96 hours No results found.  LAB RESULTS last 90 days Recent Results (from the past 2160 hours)  Basic metabolic panel     Status: None   Collection Time: 01/03/23  4:15 PM  Result Value Ref Range   Glucose 77 70 - 99 mg/dL   BUN 11 6 - 24 mg/dL   Creatinine, Ser 9.14 0.76 - 1.27 mg/dL   eGFR 893 >40 fO/fpw/8.26   BUN/Creatinine Ratio 13 9 - 20   Sodium 140 134 - 144 mmol/L   Potassium 4.0 3.5 - 5.2 mmol/L   Chloride 102 96 - 106 mmol/L   CO2 21 20 - 29 mmol/L   Calcium  9.2 8.7 - 10.2 mg/dL  Pulmonary function test     Status: None   Collection Time: 01/12/23  8:50 AM  Result Value Ref Range   FVC-Pre 3.47 L   FVC-%Pred-Pre 68 %   FVC-Post 3.43 L   FVC-%Pred-Post 67 %   FVC-%Change-Post -1 %   FEV1-Pre 3.02 L   FEV1-%Pred-Pre 76 %   FEV1-Post 3.07 L   FEV1-%Pred-Post 78 %   FEV1-%Change-Post 1 %   FEV6-Pre 3.47 L   FEV6-%Pred-Pre 70 %   FEV6-Post 3.43 L   FEV6-%Pred-Post 70 %   FEV6-%Change-Post -1 %   Pre FEV1/FVC ratio 87 %   FEV1FVC-%Pred-Pre 112 %   Post FEV1/FVC ratio 90 %   FEV1FVC-%Change-Post 2 %   Pre FEV6/FVC Ratio 100 %   FEV6FVC-%Pred-Pre 103 %   Post FEV6/FVC ratio 100 %   FEV6FVC-%Pred-Post 103 %   FEF 25-75 Pre 4.24 L/sec   FEF2575-%Pred-Pre 122 %   FEF 25-75 Post 4.47 L/sec   FEF2575-%Pred-Post 128 %   FEF2575-%Change-Post 5 %   RV 1.80 L   RV % pred 88 %   TLC 5.63 L   TLC % pred 80 %   DLCO unc 28.11 ml/min/mmHg   DLCO unc % pred 95 %   DLCO cor 28.11 ml/min/mmHg   DLCO cor % pred 95 %   DL/VA 4.55 ml/min/mmHg/L   DL/VA % pred 877 %  Lipid panel     Status: Abnormal    Collection Time: 03/14/23  9:26 AM  Result Value Ref Range   Cholesterol, Total 227 (H) 100 - 199 mg/dL   Triglycerides 833 (H) 0 - 149 mg/dL   HDL 53 >60 mg/dL   VLDL Cholesterol Cal 30 5 - 40 mg/dL   LDL Chol Calc (NIH) 855 (H) 0 - 99 mg/dL   Chol/HDL Ratio 4.3 0.0 - 5.0 ratio    Comment:                                   T. Chol/HDL Ratio                                             Men  Women                               1/2 Avg.Risk  3.4    3.3  Avg.Risk  5.0    4.4                                2X Avg.Risk  9.6    7.1                                3X Avg.Risk 23.4   11.0   Hepatic function panel     Status: Abnormal   Collection Time: 03/14/23  9:26 AM  Result Value Ref Range   Total Protein 7.4 6.0 - 8.5 g/dL   Albumin 4.2 4.1 - 5.1 g/dL   Bilirubin Total 1.9 (H) 0.0 - 1.2 mg/dL   Bilirubin, Direct 8.81 (H) 0.00 - 0.40 mg/dL   Alkaline Phosphatase 212 (H) 44 - 121 IU/L   AST 143 (H) 0 - 40 IU/L   ALT 71 (H) 0 - 44 IU/L         has a past medical history of Anxiety, GERD (gastroesophageal reflux disease), HNP (herniated nucleus pulposus), lumbar, Hypertension, and Sleep apnea.   reports that he quit smoking about 11 years ago. His smoking use included cigarettes. He started smoking about 31 years ago. He has a 40 pack-year smoking history. He has quit using smokeless tobacco.  His smokeless tobacco use included chew.  Past Surgical History:  Procedure Laterality Date   01/05/2011 LUMBAR DECOMPRESSION AND MICRODISCECTOMY     CERVICAL FUSION  C4-5      ESOPHAGOGASTRODUODENOSCOPY (EGD) WITH PROPOFOL  N/A 12/30/2014   Procedure: ESOPHAGOGASTRODUODENOSCOPY (EGD) WITH PROPOFOL ;  Surgeon: Elsie Cree, MD;  Location: [facility];  Service: Endoscopy;  Laterality: N/A;  With Bravo   LUMBAR LAMINECTOMY/DECOMPRESSION MICRODISCECTOMY  07/26/2011   Procedure: LUMBAR LAMINECTOMY/DECOMPRESSION MICRODISCECTOMY;  Surgeon: [name], MD;   Location: [facility];  Service: Orthopedics;  Laterality: Right;  Re-Do Lumbar Decompression L5-S1 on Right,    PILONIDAL CYST EXCISION      Allergies  Allergen Reactions   Other     LEMON-sores in mouth    Immunization History  Administered Date(s) Administered   Influenza Inj Mdck Quad Pf 11/22/2016   Influenza Whole 12/24/2013, 11/07/2014   Influenza, Seasonal, Injecte, Preservative Fre 11/20/2022   Influenza,inj,Quad PF,6+ Mos 11/08/2017, 01/27/2019, 02/20/2022   PFIZER(Purple Top)SARS-COV-2 Vaccination 06/07/2019, 07/08/2019   Tdap 11/22/2016    Family History  Problem Relation Age of Onset   COPD Father    Congestive Heart Failure Father    Breast cancer Mother      Current Outpatient Medications:    albuterol  (PROAIR  HFA) 108 (90 Base) MCG/ACT inhaler, Inhale 2 puffs into the lungs every 4 (four) hours as needed for wheezing or shortness of breath., Disp: 18 g, Rfl: 5   benzonatate  (TESSALON ) 100 MG capsule, Take 1 capsule (100 mg total) by mouth 3 (three) times daily as needed for cough., Disp: 30 capsule, Rfl: 0   cetirizine (ZYRTEC) 10 MG tablet, Take 20 mg by mouth daily., Disp: , Rfl:    citalopram  (CELEXA ) 40 MG tablet, Take 1 tablet (40 mg total) by mouth daily., Disp: 90 tablet, Rfl: 0   diphenhydrAMINE (BENADRYL) 25 mg capsule, Take 50 mg by mouth in the morning., Disp: , Rfl:    Dupilumab  (DUPIXENT ) 300 MG/2ML SOAJ, Inject 300 mg into the skin every 14 (fourteen) days., Disp: 12 mL, Rfl: 1   fluticasone  (FLONASE ) 50 MCG/ACT nasal spray, Place  2 sprays into both nostrils daily., Disp: 16 g, Rfl: 0   fluticasone  furoate-vilanterol (BREO ELLIPTA ) 200-25 MCG/ACT AEPB, Inhale 1 puff into the lungs daily., Disp: 60 each, Rfl: 5   ipratropium (ATROVENT ) 0.03 % nasal spray, Place 2 sprays into both nostrils every 12 (twelve) hours., Disp: 30 mL, Rfl: 0   losartan  (COZAAR ) 100 MG tablet, Take 1 tablet (100 mg total) by mouth daily., Disp: 90 tablet, Rfl: 0   methocarbamol   (ROBAXIN ) 500 MG tablet, Take 1 tablet (500 mg total) by mouth every 8 (eight) hours as needed for muscle spasms., Disp: 90 tablet, Rfl: 0   montelukast  (SINGULAIR ) 10 MG tablet, Take 1 tablet (10 mg total) by mouth daily., Disp: 90 tablet, Rfl: 3   pantoprazole  (PROTONIX ) 40 MG tablet, Take 1 tablet (40 mg total) by mouth daily., Disp: 90 tablet, Rfl: 3   rosuvastatin  (CRESTOR ) 10 MG tablet, Take 1 tablet (10 mg total) by mouth daily., Disp: 90 tablet, Rfl: 3   Vitamin D , Ergocalciferol , (DRISDOL ) 1.25 MG (50000 UNIT) CAPS capsule, Take 1 capsule (50,000 Units total) by mouth every 7 (seven) days., Disp: 8 capsule, Rfl: 0      Objective:   There were no vitals filed for this visit.  Estimated body mass index is 42.18 kg/m as calculated from the following:   Height as of 01/12/23: 5' 10 (1.778 m).   Weight as of 01/12/23: 294 lb (133.4 kg).  @WEIGHTCHANGE @  There were no vitals filed for this visit.   Physical Exam   General: No distress. *** O2 at rest: *** Cane present: *** Sitting in wheel chair: *** Frail: *** Obese: *** Neuro: Alert and Oriented x 3. GCS 15. Speech normal Psych: Pleasant Resp:  Barrel Chest - ***.  Wheeze - ***, Crackles - ***, No overt respiratory distress CVS: Normal heart sounds. Murmurs - *** Ext: Stigmata of Connective Tissue Disease - *** HEENT: Normal upper airway. PEERL +. No post nasal drip        Assessment:     No diagnosis found.     Plan:     Patient Instructions  Chronic cough DOE (dyspnea on exertion) Ground glass opacity present on imaging of lung Eosinophilia, unspecified type History of seasonal allergies  -Seems at least in part some of his symptoms are related to your pulmonary infiltrates of unclear cause and potential asthma.  Asthma diagnosis based on history of seasonal allergies, partial improvement with Breo and elevated blood eosinophils  - Asthma is causing cough and you have severe allergic asthma - high IgE  and blood eosinophils  - Pulmonary infitrales beter Oct 2024 but ? Worse Nov 2024  Plan  -  [facility] and take it regularly - continue singulari scheduled - Continue albuterol  as needed -control GERD - start biologic injection against asthma   - START Dupixent    Dizziness History tachycardia  - ongoing Zio monitor  - reassuing Coronary CT Nov 2024 - consider possible cough pre-syncope  Plan  Per Dr Kate   History of obstructive sleep apnea Morbid obesity  - prior history of wegovy  and 70# weight gain after insurance issues  Plan  - refer sleep doctor - CMA to do this 01/12/2023 - lose weight with biologic through PCP Saguier, [place], [name]    Follow-up - Return to see Dr. Geronimo or nurse practitioner in 8 weeks but after completing all of the above.   FOLLOWUP No follow-ups on file.    SIGNATURE  Dr. Dorethia Cave, M.D., F.C.C.P,  Pulmonary and Critical Care Medicine Staff Physician, [facility] Center Director - Interstitial Lung Disease  Program  Pulmonary Fibrosis [facility] Network at [facility] [place], [place], [address]  Pager: [phone], If no answer or between  15:00h - 7:00h: call [phone] Telephone: [phone]  10:15 AM 03/15/2023   Moderate Complexity MDM OFFICE  2021 E/M guidelines, first released in 2021, with minor revisions added in 2023 and 2024 Must meet the requirements for 2 out of 3 dimensions to qualify.    Number and complexity of problems addressed Amount and/or complexity of data reviewed Risk of complications and/or morbidity  One or more chronic illness with mild exacerbation, OR progression, OR  side effects of treatment  Two or more stable chronic illnesses  One undiagnosed new problem with uncertain prognosis  One acute illness with systemic symptoms   One Acute complicated injury Must meet the requirements for 1 of 3 of the categories)  Category 1: Tests and  documents, historian  Any combination of 3 of the following:  Assessment requiring an independent historian  Review of prior external note(s) from each unique source  Review of results of each unique test  Ordering of each unique test    Category 2: Interpretation of tests   Independent interpretation of a test performed by another physician/other qualified health care professional (not separately reported)  Category 3: Discuss management/tests  Discussion of management or test interpretation with external physician/other qualified health care professional/appropriate source (not separately reported) Moderate risk of morbidity from additional diagnostic testing or treatment Examples only:  Prescription drug management  Decision regarding minor surgery with identfied patient or procedure risk factors  Decision regarding elective major surgery without identified patient or procedure risk factors  Diagnosis or treatment significantly limited by social determinants of health             HIGh Complexity  OFFICE   2021 E/M guidelines, first released in 2021, with minor revisions added in 2023. Must meet the requirements for 2 out of 3 dimensions to qualify.    Number and complexity of problems addressed Amount and/or complexity of data reviewed Risk of complications and/or morbidity  Severe exacerbation of chronic illness  Acute or chronic illnesses that may pose a threat to life or bodily function, e.g., multiple trauma, acute MI, pulmonary embolus, severe respiratory distress, progressive rheumatoid arthritis, psychiatric illness with potential threat to self or others, peritonitis, acute renal failure, abrupt change in neurological status Must meet the requirements for 2 of 3 of the categories)  Category 1: Tests and documents, historian  Any combination of 3 of the following:  Assessment requiring an independent historian  Review of prior external note(s) from  each unique source  Review of results of each unique test  Ordering of each unique test    Category 2: Interpretation of tests    Independent interpretation of a test performed by another physician/other qualified health care professional (not separately reported)  Category 3: Discuss management/tests  Discussion of management or test interpretation with external physician/other qualified health care professional/appropriate source (not separately reported)  HIGH risk of morbidity from additional diagnostic testing or treatment Examples only:  Drug therapy requiring intensive monitoring for toxicity  Decision for elective major surgery with identified pateint or procedure risk factors  Decision regarding hospitalization or escalation of level of care  Decision for DNR or to de-escalate care   Parenteral  controlled  substances            LEGEND - Independent interpretation involves the interpretation of a test for which there is a CPT code, and an interpretation or report is customary. When a review and interpretation of a test is performed and documented by the provider, but not separately reported (billed), then this would represent an independent interpretation. This report does not need to conform to the usual standards of a complete report of the test. This does not include interpretation of tests that do not have formal reports such as a complete blood count with differential and blood cultures. Examples would include reviewing a chest radiograph and documenting in the medical record an interpretation, but not separately reporting (billing) the interpretation of the chest radiograph.   An appropriate source includes professionals who are not health care professionals but may be involved in the management of the patient, such as a clinical research associate, upper officer, case manager or teacher, and does not include discussion with family or informal caregivers.    - SDOH: SDOH are  the conditions in the environments where people are born, live, learn, work, play, worship, and age that affect a wide range of health, functioning, and quality-of-life outcomes and risks. (e.g., housing, food insecurity, transportation, etc.). SDOH-related Z codes ranging from Z55-Z65 are the ICD-10-CM diagnosis codes used to document SDOH data Z55 - Problems related to education and literacy Z56 - Problems related to employment and unemployment Z57 - Occupational exposure to risk factors Z58 - Problems related to physical environment Z59 - Problems related to housing and economic circumstances [phone] - Problems related to social environment [phone] - Problems related to upbringing [phone] - Other problems related to primary support group, including family circumstances Z45 - Problems related to certain psychosocial circumstances Z65 - Problems related to other psychosocial circumstances

## 2023-03-19 ENCOUNTER — Other Ambulatory Visit (HOSPITAL_COMMUNITY): Payer: Self-pay

## 2023-03-19 ENCOUNTER — Other Ambulatory Visit: Payer: Self-pay

## 2023-03-19 ENCOUNTER — Telehealth: Payer: Self-pay | Admitting: *Deleted

## 2023-03-19 DIAGNOSIS — R748 Abnormal levels of other serum enzymes: Secondary | ICD-10-CM

## 2023-03-19 DIAGNOSIS — E7800 Pure hypercholesterolemia, unspecified: Secondary | ICD-10-CM

## 2023-03-19 NOTE — Telephone Encounter (Signed)
-----   Message from Little Ishikawa sent at 03/19/2023  6:41 AM EST ----- Worsening liver enzymes.  Recommend holding statin and referring to pharmacy lipid clinic.  Would also recommend referral to gastroenterology

## 2023-03-19 NOTE — Telephone Encounter (Signed)
 Called and made patient patient aware per Dr. Bjorn Pippin recommendations worsening liver enzymes.  Recommend holding statin and referring to pharmacy lipid clinic and referral to gastroenterology . Orders placed and patient verbalized an understanding.

## 2023-03-20 ENCOUNTER — Ambulatory Visit: Payer: Commercial Managed Care - PPO | Attending: Cardiology | Admitting: Cardiology

## 2023-03-20 ENCOUNTER — Encounter: Payer: Self-pay | Admitting: Cardiology

## 2023-03-20 VITALS — BP 138/70 | HR 110 | Ht 70.0 in | Wt 293.4 lb

## 2023-03-20 DIAGNOSIS — R000 Tachycardia, unspecified: Secondary | ICD-10-CM | POA: Diagnosis not present

## 2023-03-20 DIAGNOSIS — I2510 Atherosclerotic heart disease of native coronary artery without angina pectoris: Secondary | ICD-10-CM | POA: Diagnosis not present

## 2023-03-20 DIAGNOSIS — I77819 Aortic ectasia, unspecified site: Secondary | ICD-10-CM

## 2023-03-20 DIAGNOSIS — R0609 Other forms of dyspnea: Secondary | ICD-10-CM

## 2023-03-20 DIAGNOSIS — E785 Hyperlipidemia, unspecified: Secondary | ICD-10-CM

## 2023-03-20 DIAGNOSIS — I10 Essential (primary) hypertension: Secondary | ICD-10-CM | POA: Diagnosis not present

## 2023-03-20 NOTE — Progress Notes (Signed)
 Cardiology Office Note:    Date:  03/21/2023   ID:  Ryan Holland, DOB Jul 10, 1970, MRN [id number]  PCP:  Dorina Loving, [name]  Cardiologist:  None  Electrophysiologist:  None   Referring MD: Dorina Loving, [name]   No chief complaint on file.   History of Present Illness:    Ryan Holland is a 53 y.o. male with a hx of hypertension, OSA, former tobacco use who presents for follow-up.  He was referred by Loving Dorina, PA for evaluation of tachycardia, initially seen 01/03/2023.  Echocardiogram 12/11/2022 showed EF 60 to 65%, normal diastolic function, normal RV function, mild dilatation of ascending aorta measuring 41 mm.  Coronary CTA 01/08/2023 showed nonobstructive CAD with minimal stenosis in LAD/RCA, calcium  score 0, dilated aortic root measuring 43 mm, dilated pulmonary artery.  Zio patch x 3 days 01/2023 showed no significant arrhythmias.  Since last clinic visit, he reports he is doing well.  Reports chest pain has improved.  Reports heart rates remains in the low 100s.  Reports dyspnea with minimal exertion.  Denies any lightheadedness or syncope.  Reports occasional palpitations.  Past Medical History:  Diagnosis Date   Anxiety    GERD (gastroesophageal reflux disease)    OCCAS-WOULD USE OTC MED IF NEEDED   HNP (herniated nucleus pulposus), lumbar    PAIN IN LOWER BACK AND PAIN DOWN BOTH LEGS -12-24-14 radiates down right leg presently.   Hypertension    Sleep apnea    HAS CPAP-DOES NOT USE-DOES NOT KNOW SETTINGS    Past Surgical History:  Procedure Laterality Date   01/05/2011 LUMBAR DECOMPRESSION AND MICRODISCECTOMY     CERVICAL FUSION  C4-5      ESOPHAGOGASTRODUODENOSCOPY (EGD) WITH PROPOFOL  N/A 12/30/2014   Procedure: ESOPHAGOGASTRODUODENOSCOPY (EGD) WITH PROPOFOL ;  Surgeon: Elsie Cree, MD;  Location: [facility];  Service: Endoscopy;  Laterality: N/A;  With Bravo   LUMBAR LAMINECTOMY/DECOMPRESSION MICRODISCECTOMY  07/26/2011   Procedure: LUMBAR  LAMINECTOMY/DECOMPRESSION MICRODISCECTOMY;  Surgeon: [name], MD;  Location: [facility];  Service: Orthopedics;  Laterality: Right;  Re-Do Lumbar Decompression L5-S1 on Right,    PILONIDAL CYST EXCISION      Current Medications: Current Meds  Medication Sig   albuterol  (PROAIR  HFA) 108 (90 Base) MCG/ACT inhaler Inhale 2 puffs into the lungs every 4 (four) hours as needed for wheezing or shortness of breath.   benzonatate  (TESSALON ) 100 MG capsule Take 1 capsule (100 mg total) by mouth 3 (three) times daily as needed for cough.   cetirizine (ZYRTEC) 10 MG tablet Take 20 mg by mouth daily.   citalopram  (CELEXA ) 40 MG tablet Take 1 tablet (40 mg total) by mouth daily.   diphenhydrAMINE (BENADRYL) 25 mg capsule Take 50 mg by mouth in the morning.   Dupilumab  (DUPIXENT ) 300 MG/2ML SOAJ Inject 300 mg into the skin every 14 (fourteen) days.   fluticasone  (FLONASE ) 50 MCG/ACT nasal spray Place 2 sprays into both nostrils daily.   fluticasone  furoate-vilanterol (BREO ELLIPTA ) 200-25 MCG/ACT AEPB Inhale 1 puff into the lungs daily.   ipratropium (ATROVENT ) 0.03 % nasal spray Place 2 sprays into both nostrils every 12 (twelve) hours.   losartan  (COZAAR ) 100 MG tablet Take 1 tablet (100 mg total) by mouth daily.   methocarbamol  (ROBAXIN ) 500 MG tablet Take 1 tablet (500 mg total) by mouth every 8 (eight) hours as needed for muscle spasms.   montelukast  (SINGULAIR ) 10 MG tablet Take 1 tablet (10 mg total) by mouth daily.   pantoprazole  (PROTONIX ) 40 MG tablet Take  1 tablet (40 mg total) by mouth daily.   Vitamin D , Ergocalciferol , (DRISDOL ) 1.25 MG (50000 UNIT) CAPS capsule Take 1 capsule (50,000 Units total) by mouth every 7 (seven) days.     Allergies:   Other   Social History   Socioeconomic History   Marital status: Married    Spouse name: Not on file   Number of children: Not on file   Years of education: Not on file   Highest education level: Not on file  Occupational History   Not on  file  Tobacco Use   Smoking status: Former    Current packs/day: 0.00    Average packs/day: 2.0 packs/day for 20.0 years (40.0 ttl pk-yrs)    Types: Cigarettes    Start date: 10/05/1991    Quit date: 10/05/2011    Years since quitting: 11.4   Smokeless tobacco: Former    Types: Chew  Substance and Sexual Activity   Alcohol  use: Yes    Alcohol /week: 0.0 standard drinks of alcohol     Comment: 2 MIXED DRINKS (VODKA) DAILY-usually    Drug use: No   Sexual activity: Not on file  Other Topics Concern   Not on file  Social History Narrative   Not on file   Social Drivers of Health   Financial Resource Strain: Not on file  Food Insecurity: Not on file  Transportation Needs: Not on file  Physical Activity: Not on file  Stress: Not on file  Social Connections: Unknown (07/17/2021)   Received from [facility]   Social Network    Social Network: Not on file     Family History: The patient's family history includes Breast cancer in his mother; COPD in his father; Congestive Heart Failure in his father.  ROS:   Please see the history of present illness.     All other systems reviewed and are negative.  EKGs/Labs/Other Studies Reviewed:    The following studies were reviewed today:   EKG:   01/03/2023: Normal sinus rhythm, rate 87, nonspecific T wave flattening  Recent Labs: 10/26/2022: TSH 1.13 11/02/2022: B Natriuretic Peptide 32.1 11/10/2022: Hemoglobin 15.1; Platelets 166.0; Pro B Natriuretic peptide (BNP) 17.0 01/03/2023: BUN 11; Creatinine, Ser 0.85; Potassium 4.0; Sodium 140 03/14/2023: ALT 71  Recent Lipid Panel    Component Value Date/Time   CHOL 227 (H) 03/14/2023 0926   TRIG 166 (H) 03/14/2023 0926   HDL 53 03/14/2023 0926   CHOLHDL 4.3 03/14/2023 0926   CHOLHDL 5 10/26/2022 1545   VLDL 68.0 (H) 10/26/2022 1545   LDLCALC 144 (H) 03/14/2023 0926   LDLDIRECT 189.0 10/26/2022 1545    Physical Exam:    VS:  BP 138/70 (BP Location: Right Arm,  Patient Position: Sitting, Cuff Size: Normal)   Pulse (!) 110   Ht 5' 10 (1.778 m)   Wt 293 lb 6.4 oz (133.1 kg)   SpO2 94%   BMI 42.10 kg/m     Wt Readings from Last 3 Encounters:  03/20/23 293 lb 6.4 oz (133.1 kg)  01/12/23 294 lb (133.4 kg)  01/12/23 292 lb 9.6 oz (132.7 kg)     GEN:  Well nourished, well developed in no acute distress HEENT: Normal NECK: No JVD; No carotid bruits LYMPHATICS: No lymphadenopathy CARDIAC: RRR, no murmurs, rubs, gallops RESPIRATORY:  Clear to auscultation without rales, wheezing or rhonchi  ABDOMEN: Soft, non-tender, non-distended MUSCULOSKELETAL:  No edema; No deformity  SKIN: Warm and dry NEUROLOGIC:  Alert and oriented x 3 PSYCHIATRIC:  Normal affect   ASSESSMENT:    1. Coronary artery disease involving native coronary artery of native heart without angina pectoris   2. Hyperlipidemia, unspecified hyperlipidemia type   3. DOE (dyspnea on exertion)   4. Tachycardia   5. Aortic dilatation (HCC)   6. Essential hypertension   7. Morbid obesity (HCC)     PLAN:     CAD: Reported atypical chest pain coronary CTA 01/08/2023 showed nonobstructive CAD with minimal stenosis in LAD/RCA, calcium  score 0, dilated aortic root measuring 43 mm, dilated pulmonary artery. -Started on rosuvastatin  but worsening transaminitis.  Statin held.  Referred to GI.  Will refer to pharmacy lipid clinic  Dyspnea: Follows with pulmonology, has pulmonary filtrates of unclear cause and possible asthma.  Echocardiogram 12/11/2022 showed EF 60 to 65%, normal diastolic function, normal RV function, mild dilatation of ascending aorta measuring 41 mm.  Coronary CTA with nonobstructive CAD 01/2023  Tachycardia:  Zio patch x 3 days 01/2023 showed no significant arrhythmias, average resting heart rate 93 bpm  Aortic dilatation: Measured 41 mm on echocardiogram 12/11/2022, 43 mm on CTA 01/2023.  Will monitor  Hypertension: On losartan  100 mg daily.  Mildly elevated in clinic  today, asked to check BP twice daily for next week and let us  know results  Hyperlipidemia: LDL 189 on 10/26/2022.  Started rosuvastatin  10 mg daily, LDL 144 on 03/14/2023 but worsening transaminitis.  Hold statin, will refer to pharmacy lipid clinic  OSA: reports compliance with CPAP  Morbid obesity: Body mass index is 42.1 kg/m.  Diet/exercise encouraged.  Previously tried healthy weight and wellness, but unable to do program due to his job   RTC in 6 months   Medication Adjustments/Labs and Tests Ordered: Current medicines are reviewed at length with the patient today.  Concerns regarding medicines are outlined above.  Orders Placed This Encounter  Procedures   AMB Referral to [facility] Pharm-D   No orders of the defined types were placed in this encounter.   Patient Instructions  Medication Instructions:  Your physician recommends that you continue on your current medications as directed. Please refer to the Current Medication list given to you today.    *If you need a refill on your cardiac medications before your next appointment, please call your pharmacy*   Lab Work: NONE    If you have labs (blood work) drawn today and your tests are completely normal, you will receive your results only by: MyChart Message (if you have MyChart) OR A paper copy in the mail If you have any lab test that is abnormal or we need to change your treatment, we will call you to review the results.   Testing/Procedures: NONE    Follow-Up: At [facility], you and your health needs are our priority.  As part of our continuing mission to provide you with exceptional heart care, we have created designated Provider Care Teams.  These Care Teams include your primary Cardiologist (physician) and Advanced Practice Providers (APPs -  Physician Assistants and Nurse Practitioners) who all work together to provide you with the care you need, when you need it.  We recommend signing up for the  patient portal called MyChart.  Sign up information is provided on this After Visit Summary.  MyChart is used to connect with patients for Virtual Visits (Telemedicine).  Patients are able to view lab/test results, encounter notes, upcoming appointments, etc.  Non-urgent messages can be sent to your provider as well.   To learn more about what  you can do with MyChart, go to [email or web address].    Your next appointment:   6 month(s)  The format for your next appointment:   In Person  Provider:   Lonni Nanas, MD    Other Instructions  PharmD REFERALL   GASTRO REFERRAL  Dr. Victor would like you to check your blood pressure twice a day for 7 (seven) days     Signed, Lonni LITTIE Nanas, MD  03/21/2023 6:16 AM    [facility]

## 2023-03-20 NOTE — Patient Instructions (Addendum)
 Medication Instructions:  Your physician recommends that you continue on your current medications as directed. Please refer to the Current Medication list given to you today.    *If you need a refill on your cardiac medications before your next appointment, please call your pharmacy*   Lab Work: NONE    If you have labs (blood work) drawn today and your tests are completely normal, you will receive your results only by: MyChart Message (if you have MyChart) OR A paper copy in the mail If you have any lab test that is abnormal or we need to change your treatment, we will call you to review the results.   Testing/Procedures: NONE    Follow-Up: At [facility], you and your health needs are our priority.  As part of our continuing mission to provide you with exceptional heart care, we have created designated Provider Care Teams.  These Care Teams include your primary Cardiologist (physician) and Advanced Practice Providers (APPs -  Physician Assistants and Nurse Practitioners) who all work together to provide you with the care you need, when you need it.  We recommend signing up for the patient portal called MyChart.  Sign up information is provided on this After Visit Summary.  MyChart is used to connect with patients for Virtual Visits (Telemedicine).  Patients are able to view lab/test results, encounter notes, upcoming appointments, etc.  Non-urgent messages can be sent to your provider as well.   To learn more about what you can do with MyChart, go to [email or web address].    Your next appointment:   6 month(s)  The format for your next appointment:   In Person  Provider:   Lonni Nanas, MD    Other Instructions  PharmD REFERALL   GASTRO REFERRAL  Dr. Victor would like you to check your blood pressure twice a day for 7 (seven) days

## 2023-03-21 ENCOUNTER — Other Ambulatory Visit: Payer: Self-pay

## 2023-03-23 ENCOUNTER — Encounter: Payer: Self-pay | Admitting: *Deleted

## 2023-03-23 ENCOUNTER — Telehealth: Payer: Self-pay | Admitting: Cardiology

## 2023-03-23 ENCOUNTER — Ambulatory Visit: Payer: Commercial Managed Care - PPO

## 2023-03-23 NOTE — Telephone Encounter (Signed)
Pharm D had a change in their schedule pushing the patients appt back to late Feb Pt states he was told to stop taking a medication until he sees pharmacist so was wondering if that's too far out or what is recommended at this point Pt is also on cancellation list

## 2023-03-23 NOTE — Telephone Encounter (Signed)
Called and made patient aware that per Pharm D It will be ok to continue to stop medication. Per Pharm D patient had elevated liver enzymes on statin. Per Pharm D this will give more time for his liver to recover and can recheck at that time. Patient verbalized an understanding.

## 2023-03-27 ENCOUNTER — Other Ambulatory Visit: Payer: Self-pay

## 2023-03-27 NOTE — Progress Notes (Signed)
Specialty Pharmacy Refill Coordination Note  Ryan Holland is a 53 y.o. male. Patients wife was contacted today regarding refills of specialty medication(s) Dupilumab (Dupixent)   Patient requested Courier to Provider Office   Delivery date: 03/27/23   Verified address: [address] [name], [place]   Medication will be filled on 03/27/23.

## 2023-04-03 ENCOUNTER — Other Ambulatory Visit: Payer: Self-pay

## 2023-04-08 ENCOUNTER — Other Ambulatory Visit: Payer: Self-pay | Admitting: Medical

## 2023-04-08 DIAGNOSIS — K219 Gastro-esophageal reflux disease without esophagitis: Secondary | ICD-10-CM

## 2023-04-08 DIAGNOSIS — J309 Allergic rhinitis, unspecified: Secondary | ICD-10-CM

## 2023-04-09 ENCOUNTER — Other Ambulatory Visit: Payer: Self-pay

## 2023-04-09 ENCOUNTER — Other Ambulatory Visit (HOSPITAL_COMMUNITY): Payer: Self-pay

## 2023-04-09 MED ORDER — PANTOPRAZOLE SODIUM 40 MG PO TBEC
40.0000 mg | DELAYED_RELEASE_TABLET | Freq: Every day | ORAL | 3 refills | Status: AC
  Filled 2023-04-09: qty 90, 90d supply, fill #0
  Filled 2023-07-21: qty 90, 90d supply, fill #1
  Filled 2023-10-26: qty 90, 90d supply, fill #2
  Filled 2024-01-27: qty 90, 90d supply, fill #3

## 2023-04-09 MED ORDER — MONTELUKAST SODIUM 10 MG PO TABS
10.0000 mg | ORAL_TABLET | Freq: Every day | ORAL | 3 refills | Status: AC
  Filled 2023-04-09: qty 90, 90d supply, fill #0
  Filled 2023-07-21: qty 90, 90d supply, fill #1
  Filled 2023-10-26: qty 90, 90d supply, fill #2
  Filled 2024-01-27: qty 90, 90d supply, fill #3

## 2023-04-25 ENCOUNTER — Ambulatory Visit: Payer: Commercial Managed Care - PPO | Admitting: Internal Medicine

## 2023-04-27 ENCOUNTER — Other Ambulatory Visit: Payer: Self-pay

## 2023-04-27 NOTE — Progress Notes (Signed)
Specialty Pharmacy Refill Coordination Note  Kinser Fellman is a 53 y.o. male contacted today regarding refills of specialty medication(s) Dupilumab (Dupixent)   Patient requested Courier to Provider Office   Delivery date: 05/08/23   Verified address: [address]  [place] [address]   Medication will be filled on 03.03.25.

## 2023-05-04 ENCOUNTER — Other Ambulatory Visit (HOSPITAL_BASED_OUTPATIENT_CLINIC_OR_DEPARTMENT_OTHER): Payer: Self-pay

## 2023-05-04 ENCOUNTER — Telehealth: Payer: Self-pay | Admitting: Pharmacist

## 2023-05-04 ENCOUNTER — Other Ambulatory Visit (HOSPITAL_COMMUNITY): Payer: Self-pay

## 2023-05-04 ENCOUNTER — Ambulatory Visit: Payer: Commercial Managed Care - PPO | Attending: Internal Medicine | Admitting: Pharmacist

## 2023-05-04 ENCOUNTER — Telehealth: Payer: Self-pay | Admitting: Pharmacy Technician

## 2023-05-04 ENCOUNTER — Encounter: Payer: Self-pay | Admitting: Pharmacist

## 2023-05-04 VITALS — Wt 291.2 lb

## 2023-05-04 DIAGNOSIS — I10 Essential (primary) hypertension: Secondary | ICD-10-CM | POA: Diagnosis not present

## 2023-05-04 DIAGNOSIS — I2510 Atherosclerotic heart disease of native coronary artery without angina pectoris: Secondary | ICD-10-CM

## 2023-05-04 DIAGNOSIS — F339 Major depressive disorder, recurrent, unspecified: Secondary | ICD-10-CM

## 2023-05-04 DIAGNOSIS — E785 Hyperlipidemia, unspecified: Secondary | ICD-10-CM

## 2023-05-04 DIAGNOSIS — T466X5A Adverse effect of antihyperlipidemic and antiarteriosclerotic drugs, initial encounter: Secondary | ICD-10-CM | POA: Diagnosis not present

## 2023-05-04 MED ORDER — CITALOPRAM HYDROBROMIDE 40 MG PO TABS
40.0000 mg | ORAL_TABLET | Freq: Every day | ORAL | 0 refills | Status: DC
  Filled 2023-05-04 – 2023-05-16 (×2): qty 90, 90d supply, fill #0

## 2023-05-04 MED ORDER — LOSARTAN POTASSIUM 100 MG PO TABS
100.0000 mg | ORAL_TABLET | Freq: Every day | ORAL | 0 refills | Status: DC
  Filled 2023-05-04 – 2023-05-16 (×2): qty 90, 90d supply, fill #0

## 2023-05-04 NOTE — Telephone Encounter (Signed)
 Please complete PA for Repatha

## 2023-05-04 NOTE — Progress Notes (Signed)
 Patient ID: [name]                 DOB: Sep 04, 1970                    MRN: [id number]     HPI: Ryan Holland is a 53 y.o. male patient referred to lipid clinic by Dr Bjorn Pippin. PMH is significant for HTN, OSA, GERD, obesity and elevated LFTs. Taken off rosuvastatin for transaminitis.  Patient presents today to discuss hyperlipidemia treatment. Wife is Pharmacologist at central [employer] [facility]. He is interested in trying supplements.  Had previously lost weight on Wegovy but was no longer covered on his plan. Has gained the weight back. Is not following a certain diet but reports his wife is trying to watch his portion sizes.  Drinks alcohol nightly, typically vodka. Believes that is the cause of his elevated LFTs. Last hepatic panel was after a 9 day cruise where he was drinking daily. Quit smoking in 2003.  Works in [employer] but does not get much exercise.  Current Medications:  N/A  Intolerances:  Rosuvastatin Statins  LDL goal: <70  Social History: no toacco, quit 2013  Labs: TC 227, Trigs 166, HDL 53, LDL 144  Imaging:  1. CAD-RADS 1: Minimal non-obstructive CAD (0-24%). Consider non-atherosclerotic causes of chest pain. Consider preventive therapy and risk factor modification  Past Medical History:  Diagnosis Date   Anxiety    GERD (gastroesophageal reflux disease)    OCCAS-WOULD USE OTC MED IF NEEDED   HNP (herniated nucleus pulposus), lumbar    PAIN IN LOWER BACK AND PAIN DOWN BOTH LEGS -12-24-14 radiates down right leg presently.   Hypertension    Sleep apnea    HAS CPAP-DOES NOT USE-DOES NOT KNOW SETTINGS    Current Outpatient Medications on File Prior to Visit  Medication Sig Dispense Refill   albuterol (PROAIR HFA) 108 (90 Base) MCG/ACT inhaler Inhale 2 puffs into the lungs every 4 (four) hours as needed for wheezing or shortness of breath. 18 g 5   benzonatate (TESSALON) 100 MG capsule Take 1 capsule (100 mg total) by mouth 3 (three) times daily as  needed for cough. 30 capsule 0   cetirizine (ZYRTEC) 10 MG tablet Take 20 mg by mouth daily.     citalopram (CELEXA) 40 MG tablet Take 1 tablet (40 mg total) by mouth daily. 90 tablet 0   diphenhydrAMINE (BENADRYL) 25 mg capsule Take 50 mg by mouth in the morning.     Dupilumab (DUPIXENT) 300 MG/2ML SOAJ Inject 300 mg into the skin every 14 (fourteen) days. 12 mL 1   fluticasone (FLONASE) 50 MCG/ACT nasal spray Place 2 sprays into both nostrils daily. 16 g 0   fluticasone furoate-vilanterol (BREO ELLIPTA) 200-25 MCG/ACT AEPB Inhale 1 puff into the lungs daily. 60 each 5   ipratropium (ATROVENT) 0.03 % nasal spray Place 2 sprays into both nostrils every 12 (twelve) hours. 30 mL 0   losartan (COZAAR) 100 MG tablet Take 1 tablet (100 mg total) by mouth daily. 90 tablet 0   methocarbamol (ROBAXIN) 500 MG tablet Take 1 tablet (500 mg total) by mouth every 8 (eight) hours as needed for muscle spasms. 90 tablet 0   montelukast (SINGULAIR) 10 MG tablet Take 1 tablet (10 mg total) by mouth daily. 90 tablet 3   pantoprazole (PROTONIX) 40 MG tablet Take 1 tablet (40 mg total) by mouth daily. 90 tablet 3   rosuvastatin (CRESTOR) 10 MG tablet Take 1  tablet (10 mg total) by mouth daily. (Patient not taking: Reported on 03/20/2023) 90 tablet 3   Vitamin D, Ergocalciferol, (DRISDOL) 1.25 MG (50000 UNIT) CAPS capsule Take 1 capsule (50,000 Units total) by mouth every 7 (seven) days. 8 capsule 0   No current facility-administered medications on file prior to visit.    Allergies  Allergen Reactions   Other     LEMON-sores in mouth    Assessment/Plan:  1. Hyperlipidemia - Patient last LDL 144 which is above goal of <70. Can not take statins due to chronically elevated LFTs. Due to CAD, recommend addition of PCSK9i.  Using demo pen, educated patient on mechanism of action, storage, site selection, administration, and possible adverse effects. Patient able to demonstrate in room. Will complete PA and contact  patient wit result. Recommended downloading copay card from manufacturer website. Recheck lipid panel and hepatic panel in 2-3 months.  Laural Golden, PharmD, BCACP, CDCES, CPP [address] [place], [place], [address] Phone: [phone], Fax: [phone]

## 2023-05-04 NOTE — Patient Instructions (Addendum)
 It was nice meeting you today  We would like your LDL (bad cholesterol) to be less than 70  Continue to hold your rosuvastatin  The medication we discussed today is called Repatha which is an injection you would take once every 2 weeks  I will complete the prior authorization for you and contact you when it is approved. I recommend downloading the copay card at repatha.com  Once you start the medication we will recheck your fasting lipid panel in 2-3 months.  Please call or message with any questions  Laural Golden, PharmD, BCACP, CDCES, CPP [address], [address] [place], [place], [address] Phone: [phone], Fax: [phone]

## 2023-05-04 NOTE — Telephone Encounter (Signed)
 Ran test claim for repatha. For a 28 day supply and the co-pay is 24.99 . PA is not needed at this time.This test claim was processed through [facility]- copay amounts may vary at other pharmacies due to pharmacy/plan contracts, or as the patient moves through the different stages of their insurance plan.

## 2023-05-07 ENCOUNTER — Other Ambulatory Visit (HOSPITAL_BASED_OUTPATIENT_CLINIC_OR_DEPARTMENT_OTHER): Payer: Self-pay

## 2023-05-07 MED ORDER — REPATHA SURECLICK 140 MG/ML ~~LOC~~ SOAJ
1.0000 mL | SUBCUTANEOUS | 1 refills | Status: DC
  Filled 2023-05-07 – 2023-05-17 (×5): qty 6, 84d supply, fill #0
  Filled 2023-08-16 (×2): qty 6, 84d supply, fill #1

## 2023-05-11 ENCOUNTER — Other Ambulatory Visit: Payer: Self-pay

## 2023-05-16 ENCOUNTER — Other Ambulatory Visit: Payer: Self-pay

## 2023-05-16 ENCOUNTER — Other Ambulatory Visit (HOSPITAL_COMMUNITY): Payer: Self-pay

## 2023-05-16 ENCOUNTER — Other Ambulatory Visit (HOSPITAL_BASED_OUTPATIENT_CLINIC_OR_DEPARTMENT_OTHER): Payer: Self-pay

## 2023-05-17 ENCOUNTER — Other Ambulatory Visit (HOSPITAL_COMMUNITY): Payer: Self-pay

## 2023-05-17 ENCOUNTER — Other Ambulatory Visit: Payer: Self-pay

## 2023-05-17 ENCOUNTER — Other Ambulatory Visit (HOSPITAL_BASED_OUTPATIENT_CLINIC_OR_DEPARTMENT_OTHER): Payer: Self-pay

## 2023-05-18 ENCOUNTER — Other Ambulatory Visit (HOSPITAL_COMMUNITY): Payer: Self-pay

## 2023-05-22 ENCOUNTER — Other Ambulatory Visit (HOSPITAL_COMMUNITY): Payer: Self-pay

## 2023-05-22 ENCOUNTER — Other Ambulatory Visit: Payer: Self-pay

## 2023-05-24 ENCOUNTER — Other Ambulatory Visit: Payer: Self-pay

## 2023-05-24 NOTE — Progress Notes (Signed)
 Specialty Pharmacy Refill Coordination Note  Ryan Holland is a 53 y.o. male, patients wife was contacted today regarding refills of specialty medication(s) Dupilumab (Dupixent)   Patient requested Courier to Provider Office   Delivery date: 05/24/23   Verified address: [address] [name], [place]   Medication will be filled on 05/24/23.

## 2023-05-28 ENCOUNTER — Other Ambulatory Visit: Payer: Self-pay

## 2023-05-28 ENCOUNTER — Other Ambulatory Visit (HOSPITAL_COMMUNITY): Payer: Self-pay

## 2023-05-29 ENCOUNTER — Other Ambulatory Visit: Payer: Self-pay

## 2023-05-30 ENCOUNTER — Other Ambulatory Visit: Payer: Self-pay

## 2023-05-31 ENCOUNTER — Encounter: Payer: Self-pay | Admitting: Cardiology

## 2023-06-11 ENCOUNTER — Other Ambulatory Visit: Payer: Self-pay | Admitting: Medical

## 2023-06-11 DIAGNOSIS — G8929 Other chronic pain: Secondary | ICD-10-CM

## 2023-06-12 ENCOUNTER — Other Ambulatory Visit (HOSPITAL_COMMUNITY): Payer: Self-pay

## 2023-06-12 MED ORDER — METHOCARBAMOL 500 MG PO TABS
ORAL_TABLET | ORAL | 0 refills | Status: DC
  Filled 2023-06-12: qty 30, 10d supply, fill #0

## 2023-06-12 NOTE — Telephone Encounter (Signed)
 Got 90 tab refill request for muscle relaxant. Sent 30 tab refill to pharmacy. Last saw pt in fall. Please get scheduled for follow up. Looks like some items to consider pneumovaccine, shingrix vaccine and screening psa as well as to discuss reason for muscle relaxant/review back pain.

## 2023-06-13 ENCOUNTER — Other Ambulatory Visit (HOSPITAL_COMMUNITY): Payer: Self-pay

## 2023-06-13 ENCOUNTER — Other Ambulatory Visit: Payer: Self-pay

## 2023-06-18 ENCOUNTER — Other Ambulatory Visit: Payer: Self-pay | Admitting: Internal Medicine

## 2023-06-19 ENCOUNTER — Other Ambulatory Visit: Payer: Self-pay

## 2023-06-19 ENCOUNTER — Other Ambulatory Visit (HOSPITAL_COMMUNITY): Payer: Self-pay

## 2023-06-19 MED ORDER — FLUTICASONE FUROATE-VILANTEROL 200-25 MCG/ACT IN AEPB
1.0000 | INHALATION_SPRAY | Freq: Every day | RESPIRATORY_TRACT | 5 refills | Status: DC
  Filled 2023-06-19: qty 60, 30d supply, fill #0
  Filled 2023-07-21: qty 60, 30d supply, fill #1
  Filled 2023-08-22 (×2): qty 60, 30d supply, fill #2
  Filled 2023-09-20: qty 60, 30d supply, fill #3
  Filled 2023-11-06: qty 60, 30d supply, fill #4
  Filled 2023-12-02: qty 60, 30d supply, fill #5

## 2023-06-26 ENCOUNTER — Other Ambulatory Visit: Payer: Self-pay

## 2023-06-26 ENCOUNTER — Other Ambulatory Visit: Payer: Self-pay | Admitting: Pharmacy Technician

## 2023-06-26 NOTE — Progress Notes (Signed)
 Specialty Pharmacy Refill Coordination Note  Michele Kerlin is a 53 y.o. male contacted today regarding refills of specialty medication(s) Dupilumab  (Dupixent )   Patient requested Delivery   Delivery date: 06/28/23   Verified address: [address]  [facility] [place]   Medication will be filled on 06/27/23.

## 2023-06-27 ENCOUNTER — Telehealth: Payer: Self-pay

## 2023-06-27 ENCOUNTER — Other Ambulatory Visit: Payer: Self-pay

## 2023-06-27 NOTE — Progress Notes (Signed)
 Patient has not been seen since starting treatment. Scheduling team at [facility] notified to schedule f/u appt  Geraldene Kleine, PharmD, MPH, BCPS, CPP Clinical Pharmacist (Rheumatology and Pulmonology)

## 2023-06-27 NOTE — Telephone Encounter (Signed)
 Patietn due for PA renewal for Dupixent  but has not been seen since starting treatment. Routing to scheduling team  Ryan Holland, PharmD, MPH, BCPS, CPP Clinical Pharmacist (Rheumatology and Pulmonology)

## 2023-06-27 NOTE — Telephone Encounter (Signed)
 ATC patient and no one answered, I sent MyChart message.

## 2023-06-28 ENCOUNTER — Other Ambulatory Visit: Payer: Self-pay

## 2023-06-29 NOTE — Telephone Encounter (Signed)
 Made first avail appt. PT wants to know if he can still get a does before that appt.? Please call to advise or fwd to front to advise PT. TY.

## 2023-07-02 ENCOUNTER — Other Ambulatory Visit: Payer: Self-pay

## 2023-07-02 NOTE — Telephone Encounter (Signed)
 Reached out and spoke to pt, he states that he will be back in town on Wednesday 07/04/23 and will be available for an appointment sooner than 08/22/23 if possible. MyChart visits and/or an appointment with an NP is perfectly fine as long as pt can be seen as soon as possible.

## 2023-07-03 ENCOUNTER — Other Ambulatory Visit: Payer: Self-pay

## 2023-07-04 ENCOUNTER — Other Ambulatory Visit: Payer: Self-pay

## 2023-07-06 ENCOUNTER — Encounter: Payer: Self-pay | Admitting: Internal Medicine

## 2023-07-06 ENCOUNTER — Ambulatory Visit: Admitting: Internal Medicine

## 2023-07-06 ENCOUNTER — Other Ambulatory Visit (HOSPITAL_BASED_OUTPATIENT_CLINIC_OR_DEPARTMENT_OTHER): Payer: Self-pay

## 2023-07-06 VITALS — BP 128/62 | HR 90 | Ht 70.0 in | Wt 294.0 lb

## 2023-07-06 DIAGNOSIS — R42 Dizziness and giddiness: Secondary | ICD-10-CM

## 2023-07-06 DIAGNOSIS — Z87891 Personal history of nicotine dependence: Secondary | ICD-10-CM | POA: Diagnosis not present

## 2023-07-06 DIAGNOSIS — R918 Other nonspecific abnormal finding of lung field: Secondary | ICD-10-CM

## 2023-07-06 DIAGNOSIS — D721 Eosinophilia, unspecified: Secondary | ICD-10-CM | POA: Diagnosis not present

## 2023-07-06 DIAGNOSIS — R053 Chronic cough: Secondary | ICD-10-CM | POA: Diagnosis not present

## 2023-07-06 DIAGNOSIS — J4550 Severe persistent asthma, uncomplicated: Secondary | ICD-10-CM | POA: Diagnosis not present

## 2023-07-06 DIAGNOSIS — Z889 Allergy status to unspecified drugs, medicaments and biological substances status: Secondary | ICD-10-CM | POA: Diagnosis not present

## 2023-07-06 DIAGNOSIS — Z8669 Personal history of other diseases of the nervous system and sense organs: Secondary | ICD-10-CM | POA: Diagnosis not present

## 2023-07-06 NOTE — Progress Notes (Signed)
 OV 11/20/2022  Subjective:-History from the patient and also review of the external medical record.  Patient ID: Ryan Holland, male , DOB: Jul 07, 1970 , age 53 y.o. , MRN: [id number] , ADDRESS: [address] [place] [address] PCP Saguier, Slater Duncan Patient Care Team: Saguier, Edward, [name] as PCP - General (Internal Medicine)  This Provider for this visit: Treatment Team:  Attending Provider: Maire Scot, MD    11/20/2022 -   Chief Complaint  Patient presents with   Pulmonary Consult    Referred by Sylvia Everts, PA. Pt states "have had allergies forever"- increased SOB since July 2024- gets winded sometimes just at rest and with minimal exertion. He has had cough- occ prod with clear sputum.      HPI Ryan Holland 53 y.o. -works in [employer].  Following 40 pack smoking history but quit.  He tells me that he has a history of chronic cough for many years.  This is diagnosed secondary to allergies.  He used to follow with "[facility]" at [facility].  Review extramedical records I am not able to find any allergy testing but he states allergy testing was positive.  It appears several years ago he had autoimmune profile the results are not available but I am presuming it is negative.  He was never on any asthma inhalers.  Since July 2024 he had insidious onset of shortness of breath associated with dizziness.  Dizziness happens randomly.  Shortness of breath is present with exertion relieved by rest.  After he went on Trelegy the shortness of breath did get better.  His baseline cough started going up.  Now for the last 1 or 2 weeks he has a dull pain in his right breast area.  He also has noticed some random tingling once or twice on his right arm.  The symptoms are associated with tachycardia particularly the dizziness.  He says even at rest he has tachycardia.  Sometimes the heart rate even at rest is 100-120 [90-99/min today at rest].   Review of the records  indicate that as of June 2024 he was doing okay.  Then on October 26, 2022 he did see primary care office.  For episodes of vertigo.  This is what he says the dizziness.  He points to the sinus area for this.  He was also having hypertension.  He did report his chronic cough.  At this visit he said the [facility] was not helping but today he did tell me the [facility] did help.  This subsequently resulted in a CT angiogram chest to rule out pulmonary embolism but did show a right sided groundglass opacities that I personally visualized and confirmed.  He also had Doppler lower extremities and this was negative.  He was given antibiotics both azithromycin  and Z-Pak.  Then on Labor Day weekend he was in the bass outlet at [place].  There when he went outside he got very tachycardic to a heart rate of 145 and also had blurred vision.  He did fall follow-up after this with primary care.  He is due dimer and BNP were normal.  Labs indicate that today exam nitric oxide  is normal [possible Breo effect].  His blood eosinophils are consistently high at 400 cells per cubic millimeter.  Primary care is considered but getting a referral to cardiology for Zio patch.  But he is also been referred here.  CT chest in 2016 was normal.  No ACE inhibitor intake.    X-rays hypoxemia test sit/stand  pulse ox at rest was 99% with a heart rate 90-99.  After sitting and standing 10 times he was short of breath 5 out of 10 pulse ox never changed heart rate went up to 110.  He does have sleep apnea he uses his CPAP but has not seen a sleep doctor in many years.   CT Chest data from date: 10/27/22  - personally visualized and independently interpreted : YES - my findings are: AGREE rrative & Impression  CLINICAL DATA:  Shortness of breath with positive D-dimer   EXAM: CT ANGIOGRAPHY CHEST WITH CONTRAST   TECHNIQUE: Multidetector CT imaging of the chest was performed using the standard protocol during bolus administration of  intravenous contrast. Multiplanar CT image reconstructions and MIPs were obtained to evaluate the vascular anatomy.   RADIATION DOSE REDUCTION: This exam was performed according to the departmental dose-optimization program which includes automated exposure control, adjustment of the mA and/or kV according to patient size and/or use of iterative reconstruction technique.   CONTRAST:  OMNIPAQUE  IOHEXOL  350 MG/ML SOLN   COMPARISON:  None Available.   FINDINGS: Cardiovascular: No evidence of pulmonary embolus. Normal heart size. No pericardial effusion. Normal caliber thoracic aorta with mild atherosclerotic disease. No coronary artery calcifications.   Mediastinum/Nodes: Esophagus thyroid  are unremarkable. No enlarged lymph nodes seen in the chest.   Lungs/Pleura: Central airways are patent. Patchy ground-glass opacities which are most pronounced in the right middle lobe and right upper lobe. No consolidation, pleural effusion or pneumothorax. Small solid pulmonary nodule of the right lower lobe measuring 4 mm on series 301, image 78.   Upper Abdomen: Hepatic steatosis.  No acute abnormality.   Musculoskeletal: No aggressive appearing osseous lesions.   Review of the MIP images confirms the above findings.   IMPRESSION: 1. No evidence of pulmonary embolus. 2. Patchy ground-glass opacities which are most pronounced in the right middle lobe and right upper lobe, likely infectious or inflammatory. 3. Small solid pulmonary nodule of the right lower lobe measuring 4 mm. No follow-up needed if patient is low-risk.This recommendation follows the consensus statement: Guidelines for Management of Incidental Pulmonary Nodules Detected on CT Images: From the Fleischner Society 2017; Radiology 2017; 284:228-243. 4. Hepatic steatosis. 5. Mild aortic Atherosclerosis (ICD10-I70.0).     Electronically Signed   By: Avelino Lek M.D.   On: 10/27/2022 19:41     OV  01/12/2023  Subjective:  Patient ID: Ryan Holland, male , DOB: [birth year] , age 89 y.o. , MRN: [id number] , ADDRESS: [address] [place] [address] PCP Saguier, Gaylin Ke [name] Patient Care Team: Francine Iron as PCP - General (Internal Medicine) Wendie Hamburg, MD as Consulting Physician (Cardiology)  This Provider for this visit: Treatment Team:  Attending Provider: Maire Scot, MD    01/12/2023 -   Chief Complaint  Patient presents with   Follow-up    Ct f/u & pft    Follow-up history of previous allergies but dizziness and worsening of with onset of chronic cough since July 2024: Workup in progress  HPI Clydie Delamora 53 y.o. -returns for follow-up to discuss current status and also discussed results.  Went over the history again he tells me that he has had a strong history of allergy issues.  At [facility] in 2017 skin test was positive for dust mite mold and mildew in fescue grass.  I am not able to get access to this but this is his history.  As a result of this he had seasonal  allergy symptoms especially in the spring and the fall.  This gets manifested by tickle in the throat, sinus drainage, runny eyes and red eyes.  Also some wheezing sebacic basically like hayfever.  He was on allergy shots.  In 2017 and 2020 and then stopped because of lack of change.  And then he was not on follow-up.  Most recently symptoms started in July 2024 when he started having dizziness with this the cough became ongoing.  He was has dry heaves and nausea with the cough this chest tightness there is also dizziness.  He said high risk for sleep apnea.  He had pulmonary infiltrates on CT in August 2024  Progress with issue so far -There is prior history of smoking 1 pack a day for 20 years.  - Pulmonary infiltrates on CT chest October 27, 2022: These are significantly improved on the high-resolution CT chest October 2024.  I personally visualized this and showed it to him.   He did have a subsequent CT coronary morphology on 01/08/2023.  No comment is made of the pulmonary infiltrates but I am wondering if it is back.  I am not so sure.  -Associated acid reflux symptoms: He occasionally has acid reflux but denies any overt aspiration.  -Limited allergy workup blood eosinophils and blood IgE is high   -Pulmonary function test suggest restriction for obesity but otherwise normal.  -Potential sleep apnea: Self has not seen a sleep doctor.  I am making a rereferral today.  He does use CPAP.  -Dizziness is seen Dr. Arlis Lakes.  Most recent visit 01/03/2023.  Chart reviewed.  Echocardiogram in October 2024 was normal.  ZIO monitor recommended.  -He has morbid obesity 42.3 BMI     OV 07/06/2023  Subjective:  Patient ID: Ryan Holland, male , DOB: [birth year] , age 42 y.o. , MRN: [id number] , ADDRESS: [address] [place] [address] PCP Saguier, Gaylin Ke, [name] Patient Care Team: Francine Iron as PCP - General (Internal Medicine) Wendie Hamburg, MD as Consulting Physician (Cardiology)  This Provider for this visit: Treatment Team:  Attending Provider: Maire Scot, MD    07/06/2023 -   Chief Complaint  Patient presents with   Follow-up    Breathing is overall doing well. He states gets local reaction with Dupixent  inj but feels benefits of med outweigh the issue with this. Uses benadryl cream and this helps. Cough is much improved- occ produces dark green to yellow sputum.       HPI Colyn Angrisani 53 y.o. - returns for follow up after being seen in November 2024. Started Dupixent  in December 2024 with moderate improvement in chronic cough and no issues with pollen like he has had in the past. Still having cough productive of yellow/green mucus with some improvement but not resloved. He is having coughing fits every few days with some post tussive emesis most of these episodes. These are triggered by rushing like sitting  up too quickly.Albuterol  works for these coughing fits.He is using albuterol  about 6 times per day.  Significantly improved dizzy episodes. He did follow up with cardiology and had Zio patch in November 2024 for 3 days without any significant arrhythmias.   Started repatha  due to statin intolerance with LFT elevations in February 2025   Dr Mitchel An Reflux Symptom Index (> 13-15 suggestive of LPR cough) 0 -> 5  =  none ->severe problem 01/12/2023  07/06/2023  Hoarseness of problem with voice 1 0  Clearing  Of Throat 4  2  Excess throat mucus or feeling of post nasal drip 4 3  Difficulty swallowing food, liquid or tablets 0 0  Cough after eating or lying down 5 4  Breathing difficulties or choking episodes 4 4  Troublesome or annoying cough 5 3  Sensation of something sticking in throat or lump in throat 2 2  Heartburn, chest pain, indigestion, or stomach acid coming up 4 4  TOTAL 29 22     PFT     Latest Ref Rng & Units 01/12/2023    8:50 AM 09/10/2014    9:39 AM  PFT Results  FVC-Pre L 3.47  4.20   FVC-Predicted Pre % 68  80   FVC-Post L 3.43  3.94   FVC-Predicted Post % 67  75   Pre FEV1/FVC % % 87  85   Post FEV1/FCV % % 90  85   FEV1-Pre L 3.02  3.57   FEV1-Predicted Pre % 76  85   FEV1-Post L 3.07  3.37   DLCO uncorrected ml/min/mmHg 28.11  27.88   DLCO UNC% % 95  86   DLCO corrected ml/min/mmHg 28.11    DLCO COR %Predicted % 95    DLVA Predicted % 122  109   TLC L 5.63  5.84   TLC % Predicted % 80  84   RV % Predicted % 88  87        LAB RESULTS last 96 hours No results found.       has a past medical history of Anxiety, GERD (gastroesophageal reflux disease), HNP (herniated nucleus pulposus), lumbar, Hypertension, and Sleep apnea.   reports that he quit smoking about 11 years ago. His smoking use included cigarettes. He started smoking about 31 years ago. He has a 40 pack-year smoking history. He has quit using smokeless tobacco.  His smokeless tobacco use  included chew.  Past Surgical History:  Procedure Laterality Date   01/05/2011 LUMBAR DECOMPRESSION AND MICRODISCECTOMY     CERVICAL FUSION  C4-5      ESOPHAGOGASTRODUODENOSCOPY (EGD) WITH PROPOFOL  N/A 12/30/2014   Procedure: ESOPHAGOGASTRODUODENOSCOPY (EGD) WITH PROPOFOL ;  Surgeon: Evangeline Hilts, MD;  Location: [facility];  Service: Endoscopy;  Laterality: N/A;  With Bravo   LUMBAR LAMINECTOMY/DECOMPRESSION MICRODISCECTOMY  07/26/2011   Procedure: LUMBAR LAMINECTOMY/DECOMPRESSION MICRODISCECTOMY;  Surgeon: Loel Ring, MD;  Location: [facility];  Service: Orthopedics;  Laterality: Right;  Re-Do Lumbar Decompression L5-S1 on Right,    PILONIDAL CYST EXCISION      Allergies  Allergen Reactions   Other     LEMON-sores in mouth    Immunization History  Administered Date(s) Administered   Influenza Inj Mdck Quad Pf 11/22/2016   Influenza Whole 12/24/2013, 11/07/2014   Influenza, Seasonal, Injecte, Preservative Fre 11/20/2022   Influenza,inj,Quad PF,6+ Mos 11/08/2017, 01/27/2019, 02/20/2022   PFIZER(Purple Top)SARS-COV-2 Vaccination 06/07/2019, 07/08/2019   Tdap 11/22/2016    Family History  Problem Relation Age of Onset   COPD Father    Congestive Heart Failure Father    Breast cancer Mother      Current Outpatient Medications:    albuterol  (PROAIR  HFA) 108 (90 Base) MCG/ACT inhaler, Inhale 2 puffs into the lungs every 4 (four) hours as needed for wheezing or shortness of breath., Disp: 18 g, Rfl: 5   benzonatate  (TESSALON ) 100 MG capsule, Take 1 capsule (100 mg total) by mouth 3 (three) times daily as needed for cough., Disp: 30 capsule, Rfl: 0   cetirizine (ZYRTEC) 10 MG tablet, Take 20 mg  by mouth daily., Disp: , Rfl:    citalopram  (CELEXA ) 40 MG tablet, Take 1 tablet (40 mg total) by mouth daily., Disp: 90 tablet, Rfl: 0   diphenhydrAMINE (BENADRYL) 25 mg capsule, Take 50 mg by mouth in the morning., Disp: , Rfl:    Dupilumab  (DUPIXENT ) 300 MG/2ML SOAJ, Inject 300 mg into  the skin every 14 (fourteen) days., Disp: 12 mL, Rfl: 1   Evolocumab  (REPATHA  SURECLICK) 140 MG/ML SOAJ, Inject 140 mg into the skin every 14 (fourteen) days., Disp: 6 mL, Rfl: 1   fluticasone  (FLONASE ) 50 MCG/ACT nasal spray, Place 2 sprays into both nostrils daily., Disp: 16 g, Rfl: 0   fluticasone  furoate-vilanterol (BREO ELLIPTA ) 200-25 MCG/ACT AEPB, Inhale 1 puff into the lungs daily., Disp: 60 each, Rfl: 5   losartan  (COZAAR ) 100 MG tablet, Take 1 tablet (100 mg total) by mouth daily., Disp: 90 tablet, Rfl: 0   methocarbamol  (ROBAXIN ) 500 MG tablet, Take 1 tablet by mouth every  8 hours as needed for muscle spams., Disp: 30 tablet, Rfl: 0   montelukast  (SINGULAIR ) 10 MG tablet, Take 1 tablet (10 mg total) by mouth daily., Disp: 90 tablet, Rfl: 3   pantoprazole  (PROTONIX ) 40 MG tablet, Take 1 tablet (40 mg total) by mouth daily., Disp: 90 tablet, Rfl: 3      Objective:   Vitals:   07/06/23 1110 07/06/23 1112  BP:  128/62  Pulse: 90   SpO2: (!) 80%   Weight:  294 lb (133.4 kg)  Height:  5\' 10"  (1.778 m)    Estimated body mass index is 42.18 kg/m as calculated from the following:   Height as of this encounter: 5\' 10"  (1.778 m).   Weight as of this encounter: 294 lb (133.4 kg).  @WEIGHTCHANGE @  [employer]   07/06/23 1112  Weight: 294 lb (133.4 kg)     Physical Exam   General: No distress.  O2 at rest: no Cane present: no Sitting in wheel chair: no Frail: no Obese: no Neuro: Alert and Oriented x 3. GCS 15. Speech normal Psych: Pleasant Resp:  mild wheezing, No overt respiratory distress CVS: Normal heart sounds.  Ext: Stigmata of Connective Tissue Disease - no HEENT: Normal upper airway. PEERL +. No post nasal drip        Assessment:       ICD-10-CM   1. Severe persistent asthma, uncomplicated  J45.50     2. Eosinophilia, unspecified type  D72.10     3. Chronic cough  R05.3     4. History of seasonal allergies  Z88.9     5. Dizziness  R42     6.  Ground glass opacity present on imaging of lung  R91.8     7. History of obstructive sleep apnea  Z86.69          Plan:     Patient Instructions     ICD-10-CM   1. Severe persistent asthma, uncomplicated  J45.50     2. Eosinophilia, unspecified type  D72.10     3. Chronic cough  R05.3     4. History of seasonal allergies  Z88.9     5. Dizziness  R42     6. Ground glass opacity present on imaging of lung  R91.8     7. History of obstructive sleep apnea  Z86.69       -Seems at least in part some of his symptoms are related to your pulmonary infiltrates of unclear cause and potential asthma.  You did improve with Dupixent  although you are having a local reaction that is not bothersome enough to stop the medication. Asthma diagnosis based on history of seasonal allergies, partial improvement with Breo and elevated blood eosinophils  - Asthma is causing cough and you have severe allergic asthma - high IgE and blood eosinophils   Plan  -  COtninue  Breo and take it regularly - stop singulair   - Continue albuterol  as needed -control GERD - continue Dupixent    Dizziness History tachycardia -symptoms much improved -no arhythmias on Zio in November 2024  - reassuing Coronary CT Nov 2024 - consider possible cough pre-syncope  Plan  Per Dr Alda Amas   History of obstructive sleep apnea Morbid obesity  - prior history of wegovy  and 70# weight gain after insurance issues, stable weight since November 2024 - wegovy  not covered on current insurance   Plan  - refer sleep doctor - w/ Dr. Magda Schneider 09/18/2023 at 9:30 AM - f/u with PCP for weight management medications  Pulmonary infiltrates - Pulmonary infitrales better Oct 2024 and some on coronary CT November 2024    Plan -HRCT in 3 months  Follow-up - Return to see Dr. Bertrum Brodie with HRCT prior   FOLLOWUP Return in about 3 months (around 10/06/2023) for w/ Ramaswamy, HRCT prior, Cough score .    SIGNATURE    [place], DO Internal Medicine Resident, PGY-2 12:04 PM 07/06/2023  Dr. Maire Scot, M.D., F.C.C.P,  Pulmonary and Critical Care Medicine Staff Physician, [facility] Director - Interstitial Lung Disease  Program  Pulmonary Fibrosis [facility] at [facility] [place], [place], [address]  Pager: [phone], If no answer or between  15:00h - 7:00h: call [phone] Telephone: [phone]  12:04 PM 07/06/2023

## 2023-07-06 NOTE — Patient Instructions (Addendum)
 ICD-10-CM   1. Severe persistent asthma, uncomplicated  J45.50     2. Eosinophilia, unspecified type  D72.10     3. Chronic cough  R05.3     4. History of seasonal allergies  Z88.9     5. Dizziness  R42     6. Ground glass opacity present on imaging of lung  R91.8     7. History of obstructive sleep apnea  Z86.69       -Seems at least in part some of his symptoms are related to your pulmonary infiltrates of unclear cause and potential asthma. You did improve with Dupixent  although you are having a local reaction that is not bothersome enough to stop the medication. Asthma diagnosis based on history of seasonal allergies, partial improvement with Breo and elevated blood eosinophils  - Asthma is causing cough and you have severe allergic asthma - high IgE and blood eosinophils   Plan  -  COtninue  Breo and take it regularly - stop singulair   - Continue albuterol  as needed -control GERD - continue Dupixent    Dizziness History tachycardia -symptoms much improved -no arhythmias on Zio in November 2024  - reassuing Coronary CT Nov 2024 - consider possible cough pre-syncope  Plan  Per Dr Alda Amas   History of obstructive sleep apnea Morbid obesity  - prior history of wegovy  and 70# weight gain after insurance issues, stable weight since November 2024 - wegovy  not covered on current insurance   Plan  - refer sleep doctor - w/ Dr. Magda Schneider 09/18/2023 at 9:30 AM - f/u with PCP for weight management medications  Pulmonary infiltrates - Pulmonary infitrales better Oct 2024 and some on coronary CT November 2024    Plan -HRCT in 3 months  Follow-up - Return to see Dr. Bertrum Brodie with HRCT prior

## 2023-07-09 ENCOUNTER — Other Ambulatory Visit: Payer: Self-pay

## 2023-07-09 ENCOUNTER — Other Ambulatory Visit (HOSPITAL_COMMUNITY): Payer: Self-pay

## 2023-07-09 NOTE — Progress Notes (Signed)
 Received notification from [facility] regarding a prior authorization for DUPIXENT . Authorization has been APPROVED from 07/09/2023 to 07/08/2024. Approval letter sent to scan center.   Patient must continue to fill through [facility]: [phone]    Authorization # [id number]  Spec pharmacy call center notified   Geraldene Kleine, PharmD, MPH, BCPS, CPP Clinical Pharmacist (Rheumatology and Pulmonology)

## 2023-07-09 NOTE — Progress Notes (Signed)
 Submitted an URGENT Prior Authorization RENEWAL request to [facility] for DUPIXENT  via CoverMyMeds. Will update once we receive a response.  Key: [id number]

## 2023-07-09 NOTE — Progress Notes (Signed)
 Prior authorization has been approved as of 07/09/23. Medication will be filled on 05/05. New delivery date is 07/10/23. Patient's wife Tamra has been notified.

## 2023-07-09 NOTE — Telephone Encounter (Signed)
 Submitted an URGENT Prior Authorization RENEWAL request to [facility] for DUPIXENT  via CoverMyMeds. Will update once we receive a response.  Key: [id number]

## 2023-07-09 NOTE — Telephone Encounter (Signed)
 Received notification from [facility] regarding a prior authorization for DUPIXENT . Authorization has been APPROVED from 07/09/2023 to 07/08/2024. Approval letter sent to scan center.  Patient must continue to fill through [facility]: [phone]   Authorization # [id number]  Geraldene Kleine, PharmD, MPH, BCPS, CPP Clinical Pharmacist (Rheumatology and Pulmonology)

## 2023-07-21 ENCOUNTER — Other Ambulatory Visit: Payer: Self-pay | Admitting: Medical

## 2023-07-21 DIAGNOSIS — G8929 Other chronic pain: Secondary | ICD-10-CM

## 2023-07-23 ENCOUNTER — Other Ambulatory Visit: Payer: Self-pay

## 2023-07-23 ENCOUNTER — Other Ambulatory Visit (HOSPITAL_COMMUNITY): Payer: Self-pay

## 2023-07-25 ENCOUNTER — Other Ambulatory Visit: Payer: Self-pay

## 2023-07-25 ENCOUNTER — Other Ambulatory Visit (HOSPITAL_COMMUNITY): Payer: Self-pay

## 2023-07-25 NOTE — Progress Notes (Signed)
 Specialty Pharmacy Refill Coordination Note  Ryan Holland is a 53 y.o. male contacted today regarding refills of specialty medication(s) Dupilumab  (Dupixent )   Patient requested [facility] at [facility] at [facility] to pick up at [facility])   Pickup date: 08/07/23   Medication will be filled on 08/07/2023.

## 2023-07-25 NOTE — Progress Notes (Signed)
 Specialty Pharmacy Ongoing Clinical Assessment Note  Dariush Mcnellis is a 53 y.o. male who is being followed by the specialty pharmacy service for RxSp Asthma/COPD   Patient's specialty medication(s) reviewed today: Dupilumab  (Dupixent )   Missed doses in the last 4 weeks: 0   Patient/Caregiver did not have any additional questions or concerns.   Therapeutic benefit summary: Patient is achieving benefit   Adverse events/side effects summary: No adverse events/side effects   Patient's therapy is appropriate to: Continue    Goals Addressed             This Visit's Progress    Maintain optimal adherence to therapy   On track    Patient is on track. Patient will maintain adherence      Minimize recurrence of flares   On track    Patient is on track. Patient will work on increased adherence, adhere to provider and/or lab appointments, and avoid flare triggers. Patient's wife states breathing is well controlled.         Follow up: 6 months  [facility]

## 2023-07-30 ENCOUNTER — Other Ambulatory Visit (HOSPITAL_COMMUNITY): Payer: Self-pay

## 2023-07-31 ENCOUNTER — Other Ambulatory Visit: Payer: Self-pay

## 2023-08-07 ENCOUNTER — Other Ambulatory Visit (HOSPITAL_COMMUNITY): Payer: Self-pay

## 2023-08-08 ENCOUNTER — Encounter: Payer: Self-pay | Admitting: Cardiology

## 2023-08-14 ENCOUNTER — Ambulatory Visit (INDEPENDENT_AMBULATORY_CARE_PROVIDER_SITE_OTHER): Admitting: Medical

## 2023-08-14 ENCOUNTER — Encounter: Payer: Self-pay | Admitting: Medical

## 2023-08-14 ENCOUNTER — Other Ambulatory Visit (HOSPITAL_COMMUNITY): Payer: Self-pay

## 2023-08-14 VITALS — BP 138/70 | HR 100 | Resp 18 | Ht 70.0 in | Wt 288.4 lb

## 2023-08-14 DIAGNOSIS — Z23 Encounter for immunization: Secondary | ICD-10-CM | POA: Diagnosis not present

## 2023-08-14 DIAGNOSIS — I10 Essential (primary) hypertension: Secondary | ICD-10-CM

## 2023-08-14 DIAGNOSIS — J4540 Moderate persistent asthma, uncomplicated: Secondary | ICD-10-CM | POA: Diagnosis not present

## 2023-08-14 DIAGNOSIS — R739 Hyperglycemia, unspecified: Secondary | ICD-10-CM

## 2023-08-14 DIAGNOSIS — K219 Gastro-esophageal reflux disease without esophagitis: Secondary | ICD-10-CM | POA: Diagnosis not present

## 2023-08-14 DIAGNOSIS — Z6841 Body Mass Index (BMI) 40.0 and over, adult: Secondary | ICD-10-CM

## 2023-08-14 DIAGNOSIS — K760 Fatty (change of) liver, not elsewhere classified: Secondary | ICD-10-CM

## 2023-08-14 DIAGNOSIS — F419 Anxiety disorder, unspecified: Secondary | ICD-10-CM

## 2023-08-14 DIAGNOSIS — G4730 Sleep apnea, unspecified: Secondary | ICD-10-CM | POA: Diagnosis not present

## 2023-08-14 DIAGNOSIS — R252 Cramp and spasm: Secondary | ICD-10-CM

## 2023-08-14 DIAGNOSIS — R748 Abnormal levels of other serum enzymes: Secondary | ICD-10-CM

## 2023-08-14 DIAGNOSIS — E785 Hyperlipidemia, unspecified: Secondary | ICD-10-CM

## 2023-08-14 DIAGNOSIS — Z0000 Encounter for general adult medical examination without abnormal findings: Secondary | ICD-10-CM

## 2023-08-14 DIAGNOSIS — Z125 Encounter for screening for malignant neoplasm of prostate: Secondary | ICD-10-CM

## 2023-08-14 DIAGNOSIS — Z1283 Encounter for screening for malignant neoplasm of skin: Secondary | ICD-10-CM

## 2023-08-14 MED ORDER — WEGOVY 0.25 MG/0.5ML ~~LOC~~ SOAJ
0.2500 mg | SUBCUTANEOUS | 0 refills | Status: DC
  Filled 2023-08-14: qty 2, 28d supply, fill #0

## 2023-08-14 NOTE — Progress Notes (Signed)
 Subjective:    Patient ID: Ryan Holland, male    DOB: 04-06-1970, 53 y.o.   MRN: [id number]  HPI Here for wellness exam  Pt manaAdmits drinking heavily for years [employer](12-16 hours). Does not exercise regularly. Moderate healthy diet/better recently. Stopped eating fried foods. Nonsmoker. Drinks 2 litter of voda every week.   Htn- bp controlled.- losartan  100 mg daily,    Gerd- on protonix  40 mg daily. Contrrolled.   Seasonal allergies spring and fall. On montelukast  presently and controlled. Saw allergies in past did get immunotherapy.   Anxiety- controlled with celexa .   Sleep apnea- has cpap machine. Pt has follow up appointment in June.     Asthma is causing cough and you have severe allergic asthma - high IgE and blood eosinophils      Plan  -  Cotninue  Breo and take it regularly - continue singulari scheduled - Continue albuterol  as needed -control GERD - start biologic injection against asthma             - START Dupixent    High cholesterol- pt on repatha  per cardiologist.   Obese pt- previously was on wegovy . Pt lost 70 ls with wegovy .  After med stopped he gained wait back. No adverse side effect.    Review of Systems  Constitutional:  Negative for chills, fatigue and fever.  HENT:  Negative for congestion, ear discharge and ear pain.   Respiratory:  Negative for cough, chest tightness, shortness of breath and wheezing.   Cardiovascular:  Negative for chest pain and palpitations.  Gastrointestinal:  Negative for abdominal pain, blood in stool, diarrhea and nausea.  Genitourinary:  Negative for dysuria, flank pain, hematuria, penile pain and urgency.  Musculoskeletal:  Negative for back pain and myalgias.  Skin:  Negative for rash.  Neurological:  Negative for dizziness, weakness, numbness and headaches.  Hematological:  Negative for adenopathy. Does not bruise/bleed easily.  Psychiatric/Behavioral:  Negative for behavioral problems  and dysphoric mood.     Past Medical History:  Diagnosis Date   Anxiety    GERD (gastroesophageal reflux disease)    OCCAS-WOULD USE OTC MED IF NEEDED   HNP (herniated nucleus pulposus), lumbar    PAIN IN LOWER BACK AND PAIN DOWN BOTH LEGS -12-24-14 radiates down right leg presently.   Hypertension    Sleep apnea    HAS CPAP-DOES NOT USE-DOES NOT KNOW SETTINGS     Social History   Socioeconomic History   Marital status: Married    Spouse name: Not on file   Number of children: Not on file   Years of education: Not on file   Highest education level: Not on file  Occupational History   Not on file  Tobacco Use   Smoking status: Former    Current packs/day: 0.00    Average packs/day: 2.0 packs/day for 20.0 years (40.0 ttl pk-yrs)    Types: Cigarettes    Start date: 10/05/1991    Quit date: 10/05/2011    Years since quitting: 11.8   Smokeless tobacco: Former    Types: Chew  Substance and Sexual Activity   Alcohol use: Yes    Alcohol/week: 0.0 standard drinks of alcohol    Comment: 2 MIXED DRINKS (VODKA) DAILY-usually    Drug use: No   Sexual activity: Not on file  Other Topics Concern   Not on file  Social History Narrative   Not on file   Social Drivers of Corporate investment banker  Strain: Not on file  Food Insecurity: Not on file  Transportation Needs: Not on file  Physical Activity: Not on file  Stress: Not on file  Social Connections: Unknown (07/17/2021)   Received from [facility]   Social Network    Social Network: Not on file  Intimate Partner Violence: Unknown (06/08/2021)   Received from [facility], [facility]   HITS    Physically Hurt: Not on file    Insult or Talk Down To: Not on file    Threaten Physical Harm: Not on file    Scream or Curse: Not on file    Past Surgical History:  Procedure Laterality Date   01/05/2011 LUMBAR DECOMPRESSION AND MICRODISCECTOMY     CERVICAL FUSION  C4-5      ESOPHAGOGASTRODUODENOSCOPY (EGD)  WITH PROPOFOL  N/A 12/30/2014   Procedure: ESOPHAGOGASTRODUODENOSCOPY (EGD) WITH PROPOFOL ;  Surgeon: Evangeline Hilts, MD;  Location: [facility];  Service: Endoscopy;  Laterality: N/A;  With Bravo   LUMBAR LAMINECTOMY/DECOMPRESSION MICRODISCECTOMY  07/26/2011   Procedure: LUMBAR LAMINECTOMY/DECOMPRESSION MICRODISCECTOMY;  Surgeon: Loel Ring, MD;  Location: [facility];  Service: Orthopedics;  Laterality: Right;  Re-Do Lumbar Decompression L5-S1 on Right,    PILONIDAL CYST EXCISION      Family History  Problem Relation Age of Onset   COPD Father    Congestive Heart Failure Father    Breast cancer Mother     Allergies  Allergen Reactions   Other     LEMON-sores in mouth    Current Outpatient Medications on File Prior to Visit  Medication Sig Dispense Refill   albuterol  (PROAIR  HFA) 108 (90 Base) MCG/ACT inhaler Inhale 2 puffs into the lungs every 4 (four) hours as needed for wheezing or shortness of breath. 18 g 5   citalopram  (CELEXA ) 40 MG tablet Take 1 tablet (40 mg total) by mouth daily. 90 tablet 0   diphenhydrAMINE (BENADRYL) 25 mg capsule Take 50 mg by mouth in the morning.     Dupilumab  (DUPIXENT ) 300 MG/2ML SOAJ Inject 300 mg into the skin every 14 (fourteen) days. 12 mL 1   Evolocumab  (REPATHA  SURECLICK) 140 MG/ML SOAJ Inject 140 mg into the skin every 14 (fourteen) days. 6 mL 1   fluticasone  (FLONASE ) 50 MCG/ACT nasal spray Place 2 sprays into both nostrils daily. 16 g 0   fluticasone  furoate-vilanterol (BREO ELLIPTA ) 200-25 MCG/ACT AEPB Inhale 1 puff into the lungs daily. 60 each 5   losartan  (COZAAR ) 100 MG tablet Take 1 tablet (100 mg total) by mouth daily. 90 tablet 0   methocarbamol  (ROBAXIN ) 500 MG tablet Take 1 tablet by mouth every  8 hours as needed for muscle spams. 30 tablet 0   montelukast  (SINGULAIR ) 10 MG tablet Take 1 tablet (10 mg total) by mouth daily. 90 tablet 3   pantoprazole  (PROTONIX ) 40 MG tablet Take 1 tablet (40 mg total) by mouth daily. 90 tablet 3    No current facility-administered medications on file prior to visit.    BP 138/70   Pulse 100   Resp 18   Ht 5\' 10"  (1.778 m)   Wt 288 lb 6.4 oz (130.8 kg)   SpO2 94%   BMI 41.38 kg/m        Objective:   Physical Exam  General Mental Status- Alert. General Appearance- Not in acute distress.   Skin Scattered dark mild large pigmented lesion both forearms worse left side.  Neck Carotid Arteries- Normal color. Moisture- Normal Moisture. No carotid bruits. No  JVD.  Chest and Lung Exam Auscultation: Breath Sounds:-cta  Cardiovascular Auscultation:Rythm- RRR Murmurs & Other Heart Sounds:Auscultation of the heart reveals- No Murmurs.  Abdomen Inspection:-Inspeection Normal. Palpation/Percussion:Note:No mass. Palpation and Percussion of the abdomen reveal- Non Tender, Non Distended + BS, no rebound or guarding.   Neurologic Cranial Nerve exam:- CN III-XII intact(No nystagmus), symmetric smile. Strength:- 5/5 equal and symmetric strength both upper and lower extremities.   Derm- scattered hyperpigmented skin lesion both forearm. Left forearm wores than rt side.    Assessment & Plan:   Patient Instructions  For you wellness exam today I have ordered cbc, cmp, psa and lipid panel.  Vaccine given today pcv 20 and shingrix  Colonoscopy up to date  Recommend exercise and healthy diet.  We will let you know lab results as they come in.  Follow up date appointment will be determined after lab review.    Refer to dermatologist to evalute skin cancer screening/forearms.   Fatty Liver Disease Fatty liver disease likely exacerbated by alcohol, risk of cirrhosis if alcohol not reduced. Previous ultrasounds confirmed fatty liver. - Repeat liver function tests today. - Consider repeating liver ultrasound. - Advised gradual reduction of alcohol consumption.  Obesity BMI 41, obesity contributes to fatty liver, hypertension, sleep apnea. Previous weight loss with  Wegovy  halted due to insurance issues. - Attempt insurance coverage for Wegovy  with diagnoses of sleep apnea, hypertension, fatty liver, and BMI over 40.  Hypertension Blood pressure 138/70 mmHg on losartan  100 mg daily. Weight loss may improve control. - Continue losartan  100 mg daily. - Encourage weight loss.  Sleep Apnea Managed with CPAP therapy. Upcoming pulmonologist appointment. - Attend pulmonologist appointment. - Continue CPAP therapy.  Asthma with Allergic Component Under pulmonologist care, on prescribed medications. - Continue medications prescribed by pulmonologist.  Gastroesophageal Reflux Disease (GERD) GERD well-controlled with Protonix . - Continue Protonix .  General Health Maintenance Due for vaccinations. PCV20 indicated due to lung history. Shingrix indicated due to age and shingles risk. - Administer PCV20 vaccine today. - Administer Shingrix vaccine today. - Schedule second Shingrix dose in 2-6 months.  Follow-up Next colonoscopy due in 2031.        [phone] charge as did address obesity, gerd, asthma, sleep  apnea, gerd, htn and tatty liver.

## 2023-08-14 NOTE — Patient Instructions (Addendum)
 For you wellness exam today I have ordered cbc, cmp, psa and lipid panel.  Vaccine given today pcv 20 and shingrix  Colonoscopy up to date  Recommend exercise and healthy diet.  We will let you know lab results as they come in.  Follow up date appointment will be determined after lab review.    Refer to dermatologist to evalute skin cancer screening/forearms.   Fatty Liver Disease Fatty liver disease likely exacerbated by alcohol, risk of cirrhosis if alcohol not reduced. Previous ultrasounds confirmed fatty liver. - Repeat liver function tests today. - Consider repeating liver ultrasound. - Advised gradual reduction of alcohol consumption.  Obesity BMI 41, obesity contributes to fatty liver, hypertension, sleep apnea. Previous weight loss with Wegovy  halted due to insurance issues. - Attempt insurance coverage for Wegovy  with diagnoses of sleep apnea, hypertension, fatty liver, and BMI over 40.  Hypertension Blood pressure 138/70 mmHg on losartan  100 mg daily. Weight loss may improve control. - Continue losartan  100 mg daily. - Encourage weight loss.  Sleep Apnea Managed with CPAP therapy. Upcoming pulmonologist appointment. - Attend pulmonologist appointment. - Continue CPAP therapy.  Asthma with Allergic Component Under pulmonologist care, on prescribed medications. - Continue medications prescribed by pulmonologist.  Gastroesophageal Reflux Disease (GERD) GERD well-controlled with Protonix . - Continue Protonix .  General Health Maintenance Due for vaccinations. PCV20 indicated due to lung history. Shingrix indicated due to age and shingles risk. - Administer PCV20 vaccine today. - Administer Shingrix vaccine today. - Schedule second Shingrix dose in 2-6 months.  Follow-up Next colonoscopy due in 2031.  Preventive Care 89-67 Years Old, Male Preventive care refers to lifestyle choices and visits with your health care provider that can promote health and wellness.  Preventive care visits are also called wellness exams. What can I expect for my preventive care visit? Counseling During your preventive care visit, your health care provider may ask about your: Medical history, including: Past medical problems. Family medical history. Current health, including: Emotional well-being. Home life and relationship well-being. Sexual activity. Lifestyle, including: Alcohol, nicotine or tobacco, and drug use. Access to firearms. Diet, exercise, and sleep habits. Safety issues such as seatbelt and bike helmet use. Sunscreen use. Work and work Astronomer. Physical exam Your health care provider will check your: Height and weight. These may be used to calculate your BMI (body mass index). BMI is a measurement that tells if you are at a healthy weight. Waist circumference. This measures the distance around your waistline. This measurement also tells if you are at a healthy weight and may help predict your risk of certain diseases, such as type 2 diabetes and high blood pressure. Heart rate and blood pressure. Body temperature. Skin for abnormal spots. What immunizations do I need?  Vaccines are usually given at various ages, according to a schedule. Your health care provider will recommend vaccines for you based on your age, medical history, and lifestyle or other factors, such as travel or where you work. What tests do I need? Screening Your health care provider may recommend screening tests for certain conditions. This may include: Lipid and cholesterol levels. Diabetes screening. This is done by checking your blood sugar (glucose) after you have not eaten for a while (fasting). Hepatitis B test. Hepatitis C test. HIV (human immunodeficiency virus) test. STI (sexually transmitted infection) testing, if you are at risk. Lung cancer screening. Prostate cancer screening. Colorectal cancer screening. Talk with your health care provider about your test  results, treatment options, and if necessary, the need  for more tests. Follow these instructions at home: Eating and drinking  Eat a diet that includes fresh fruits and vegetables, whole grains, lean protein, and low-fat dairy products. Take vitamin and mineral supplements as recommended by your health care provider. Do not drink alcohol if your health care provider tells you not to drink. If you drink alcohol: Limit how much you have to 0-2 drinks a day. Know how much alcohol is in your drink. In the [place] one drink equals one 12 oz bottle of beer (355 mL), one 5 oz glass of wine (148 mL), or one 1 oz glass of hard liquor (44 mL). Lifestyle Brush your teeth every morning and night with fluoride toothpaste. Floss one time each day. Exercise for at least 30 minutes 5 or more days each week. Do not use any products that contain nicotine or tobacco. These products include cigarettes, chewing tobacco, and vaping devices, such as e-cigarettes. If you need help quitting, ask your health care provider. Do not use drugs. If you are sexually active, practice safe sex. Use a condom or other form of protection to prevent STIs. Take aspirin only as told by your health care provider. Make sure that you understand how much to take and what form to take. Work with your health care provider to find out whether it is safe and beneficial for you to take aspirin daily. Find healthy ways to manage stress, such as: Meditation, yoga, or listening to music. Journaling. Talking to a trusted person. Spending time with friends and family. Minimize exposure to UV radiation to reduce your risk of skin cancer. Safety Always wear your seat belt while driving or riding in a vehicle. Do not drive: If you have been drinking alcohol. Do not ride with someone who has been drinking. When you are tired or distracted. While texting. If you have been using any mind-altering substances or drugs. Wear a helmet and other  protective equipment during sports activities. If you have firearms in your house, make sure you follow all gun safety procedures. What's next? Go to your health care provider once a year for an annual wellness visit. Ask your health care provider how often you should have your eyes and teeth checked. Stay up to date on all vaccines. This information is not intended to replace advice given to you by your health care provider. Make sure you discuss any questions you have with your health care provider. Document Revised: 08/18/2020 Document Reviewed: 08/18/2020 Elsevier Patient Education  2024 [employer].

## 2023-08-15 ENCOUNTER — Other Ambulatory Visit: Payer: Self-pay

## 2023-08-16 ENCOUNTER — Encounter: Payer: Self-pay | Admitting: Medical

## 2023-08-16 ENCOUNTER — Other Ambulatory Visit: Payer: Self-pay | Admitting: Medical

## 2023-08-16 ENCOUNTER — Other Ambulatory Visit (HOSPITAL_COMMUNITY): Payer: Self-pay

## 2023-08-16 ENCOUNTER — Other Ambulatory Visit: Payer: Self-pay | Admitting: Pharmacist

## 2023-08-16 DIAGNOSIS — F339 Major depressive disorder, recurrent, unspecified: Secondary | ICD-10-CM

## 2023-08-16 DIAGNOSIS — G8929 Other chronic pain: Secondary | ICD-10-CM

## 2023-08-16 DIAGNOSIS — I10 Essential (primary) hypertension: Secondary | ICD-10-CM

## 2023-08-16 MED ORDER — LOSARTAN POTASSIUM 100 MG PO TABS
100.0000 mg | ORAL_TABLET | Freq: Every day | ORAL | 0 refills | Status: DC
  Filled 2023-08-16: qty 90, 90d supply, fill #0

## 2023-08-16 MED ORDER — CITALOPRAM HYDROBROMIDE 40 MG PO TABS
40.0000 mg | ORAL_TABLET | Freq: Every day | ORAL | 0 refills | Status: DC
  Filled 2023-08-16: qty 90, 90d supply, fill #0

## 2023-08-17 ENCOUNTER — Other Ambulatory Visit: Payer: Self-pay

## 2023-08-17 ENCOUNTER — Other Ambulatory Visit (INDEPENDENT_AMBULATORY_CARE_PROVIDER_SITE_OTHER)

## 2023-08-17 ENCOUNTER — Encounter: Payer: Self-pay | Admitting: Medical

## 2023-08-17 ENCOUNTER — Other Ambulatory Visit (HOSPITAL_COMMUNITY): Payer: Self-pay

## 2023-08-17 DIAGNOSIS — Z0000 Encounter for general adult medical examination without abnormal findings: Secondary | ICD-10-CM

## 2023-08-17 DIAGNOSIS — R252 Cramp and spasm: Secondary | ICD-10-CM

## 2023-08-17 DIAGNOSIS — Z125 Encounter for screening for malignant neoplasm of prostate: Secondary | ICD-10-CM

## 2023-08-17 MED ORDER — METHOCARBAMOL 500 MG PO TABS
ORAL_TABLET | ORAL | 0 refills | Status: DC
  Filled 2023-08-17: qty 30, 10d supply, fill #0

## 2023-08-17 MED ORDER — BENZONATATE 100 MG PO CAPS
100.0000 mg | ORAL_CAPSULE | Freq: Three times a day (TID) | ORAL | 0 refills | Status: DC | PRN
  Filled 2023-08-17: qty 30, 10d supply, fill #0

## 2023-08-17 NOTE — Addendum Note (Signed)
 Addended by: Marigene Shoulder on: 08/17/2023 07:36 AM   Modules accepted: Orders

## 2023-08-17 NOTE — Telephone Encounter (Signed)
Rx robaxin sent to pharmacy.

## 2023-08-17 NOTE — Addendum Note (Signed)
 Addended by: Serafina Damme on: 08/17/2023 05:05 PM   Modules accepted: Orders

## 2023-08-18 ENCOUNTER — Other Ambulatory Visit (HOSPITAL_BASED_OUTPATIENT_CLINIC_OR_DEPARTMENT_OTHER): Payer: Self-pay

## 2023-08-20 ENCOUNTER — Telehealth: Payer: Self-pay

## 2023-08-20 ENCOUNTER — Ambulatory Visit: Payer: Self-pay | Admitting: Medical

## 2023-08-20 ENCOUNTER — Other Ambulatory Visit (HOSPITAL_COMMUNITY): Payer: Self-pay

## 2023-08-20 NOTE — Addendum Note (Signed)
 Addended by: Serafina Damme on: 08/20/2023 04:09 PM   Modules accepted: Orders

## 2023-08-20 NOTE — Telephone Encounter (Signed)
 Pharmacy Patient Advocate Encounter   Received notification from Patient Advice Request messages that prior authorization for Wegovy  0.25mg /0.49ml is required/requested.   Insurance verification completed.   The patient is insured through [facility] .   Per test claim: Wegovy  - Product/Service not covered - Plan/benefit exclusion. Excluded drug  Prior authorization not submitted due to Wegovy  is an excluded drug and is not covered by the insurance.

## 2023-08-20 NOTE — Addendum Note (Signed)
 Addended by: Serafina Damme on: 08/20/2023 10:07 PM   Modules accepted: Orders

## 2023-08-21 LAB — CBC WITH DIFFERENTIAL/PLATELET
Absolute Lymphocytes: 1065 {cells}/uL (ref 850–3900)
Absolute Monocytes: 653 {cells}/uL (ref 200–950)
Basophils Absolute: 83 {cells}/uL (ref 0–200)
Basophils Relative: 1.1 %
Eosinophils Absolute: 308 {cells}/uL (ref 15–500)
Eosinophils Relative: 4.1 %
HCT: 40.8 % (ref 38.5–50.0)
Hemoglobin: 13.9 g/dL (ref 13.2–17.1)
MCH: 35.7 pg — ABNORMAL HIGH (ref 27.0–33.0)
MCHC: 34.1 g/dL (ref 32.0–36.0)
MCV: 104.9 fL — ABNORMAL HIGH (ref 80.0–100.0)
MPV: 10.4 fL (ref 7.5–12.5)
Monocytes Relative: 8.7 %
Neutro Abs: 5393 {cells}/uL (ref 1500–7800)
Neutrophils Relative %: 71.9 %
Platelets: 148 10*3/uL (ref 140–400)
RBC: 3.89 10*6/uL — ABNORMAL LOW (ref 4.20–5.80)
RDW: 12.2 % (ref 11.0–15.0)
Total Lymphocyte: 14.2 %
WBC: 7.5 10*3/uL (ref 3.8–10.8)

## 2023-08-21 LAB — MAGNESIUM: Magnesium: 1.8 mg/dL (ref 1.5–2.5)

## 2023-08-21 LAB — TEST AUTHORIZATION

## 2023-08-21 LAB — COMPREHENSIVE METABOLIC PANEL WITH GFR
AG Ratio: 1 (calc) (ref 1.0–2.5)
ALT: 50 U/L — ABNORMAL HIGH (ref 9–46)
AST: 98 U/L — ABNORMAL HIGH (ref 10–35)
Albumin: 3.7 g/dL (ref 3.6–5.1)
Alkaline phosphatase (APISO): 188 U/L — ABNORMAL HIGH (ref 35–144)
BUN: 12 mg/dL (ref 7–25)
CO2: 22 mmol/L (ref 20–32)
Calcium: 8.7 mg/dL (ref 8.6–10.3)
Chloride: 102 mmol/L (ref 98–110)
Creat: 0.77 mg/dL (ref 0.70–1.30)
Globulin: 3.6 g/dL (ref 1.9–3.7)
Glucose, Bld: 109 mg/dL — ABNORMAL HIGH (ref 65–99)
Potassium: 3.8 mmol/L (ref 3.5–5.3)
Sodium: 136 mmol/L (ref 135–146)
Total Bilirubin: 1.8 mg/dL — ABNORMAL HIGH (ref 0.2–1.2)
Total Protein: 7.3 g/dL (ref 6.1–8.1)
eGFR: 108 mL/min/{1.73_m2} (ref >=60)

## 2023-08-21 LAB — LIPID PANEL
Cholesterol: 226 mg/dL — ABNORMAL HIGH (ref <200)
HDL: 61 mg/dL (ref >=40)
LDL Cholesterol (Calc): 133 mg/dL — ABNORMAL HIGH
Non-HDL Cholesterol (Calc): 165 mg/dL — ABNORMAL HIGH (ref <130)
Total CHOL/HDL Ratio: 3.7 (calc) (ref <5.0)
Triglycerides: 180 mg/dL — ABNORMAL HIGH (ref <150)

## 2023-08-21 LAB — PSA: PSA: 0.43 ng/mL (ref <=4.00)

## 2023-08-21 LAB — HEMOGLOBIN A1C W/OUT EAG: Hgb A1c MFr Bld: 5.1 % (ref <5.7)

## 2023-08-22 ENCOUNTER — Telehealth: Payer: Self-pay

## 2023-08-22 ENCOUNTER — Other Ambulatory Visit: Payer: Self-pay

## 2023-08-22 ENCOUNTER — Ambulatory Visit: Admitting: Nurse Practitioner

## 2023-08-22 DIAGNOSIS — R918 Other nonspecific abnormal finding of lung field: Secondary | ICD-10-CM

## 2023-08-22 NOTE — Telephone Encounter (Signed)
 Copied from CRM [id number]. Topic: Clinical - Medical Advice >> Aug 22, 2023  8:58 AM Ryan Holland wrote: Reason for CRM: Patient would like to confirm if [place] CT scan of his lungs needs to be completed before his visit on 8/26 with Dr.Ramaswamy.  I called and spoke to the pt. I informed pt that the CT would need to be completed prior to the appointment and they would call him closer to time to have it scheduled. Pt verbalized understanding. NFN  I saw where the Ct was never ordered but it was stated in the LOV to be done. I will place the order and have PCC's schedule this 2-3 weeks prior to the upcoming appointment.

## 2023-08-23 ENCOUNTER — Other Ambulatory Visit: Payer: Self-pay

## 2023-08-25 ENCOUNTER — Other Ambulatory Visit (HOSPITAL_COMMUNITY): Payer: Self-pay

## 2023-08-28 ENCOUNTER — Other Ambulatory Visit (HOSPITAL_COMMUNITY): Payer: Self-pay

## 2023-08-31 ENCOUNTER — Ambulatory Visit (HOSPITAL_BASED_OUTPATIENT_CLINIC_OR_DEPARTMENT_OTHER)
Admission: RE | Admit: 2023-08-31 | Discharge: 2023-08-31 | Disposition: A | Discharge status: Home / Self Care | Source: Ambulatory Visit | Attending: Medical | Admitting: Medical

## 2023-08-31 DIAGNOSIS — R7989 Other specified abnormal findings of blood chemistry: Secondary | ICD-10-CM | POA: Diagnosis not present

## 2023-08-31 DIAGNOSIS — K760 Fatty (change of) liver, not elsewhere classified: Secondary | ICD-10-CM | POA: Diagnosis not present

## 2023-08-31 DIAGNOSIS — R748 Abnormal levels of other serum enzymes: Secondary | ICD-10-CM | POA: Diagnosis not present

## 2023-08-31 DIAGNOSIS — K828 Other specified diseases of gallbladder: Secondary | ICD-10-CM | POA: Diagnosis not present

## 2023-09-01 NOTE — Addendum Note (Signed)
 Addended by: [name] HERO on: 09/01/2023 10:05 AM   Modules accepted: Orders

## 2023-09-17 ENCOUNTER — Other Ambulatory Visit: Payer: Self-pay

## 2023-09-17 ENCOUNTER — Encounter: Payer: Self-pay | Admitting: Internal Medicine

## 2023-09-18 ENCOUNTER — Ambulatory Visit (INDEPENDENT_AMBULATORY_CARE_PROVIDER_SITE_OTHER): Admitting: Pulmonary Disease

## 2023-09-18 ENCOUNTER — Encounter: Payer: Self-pay | Admitting: Pulmonary Disease

## 2023-09-18 VITALS — BP 134/68 | HR 102 | Ht 70.0 in | Wt 282.0 lb

## 2023-09-18 DIAGNOSIS — G4733 Obstructive sleep apnea (adult) (pediatric): Secondary | ICD-10-CM

## 2023-09-18 NOTE — Progress Notes (Signed)
 Ryan Holland    [id number]    1970-10-17  Primary Care Physician:Saguier, Ryan Holland  Referring Physician: [name], [name] [address] [place],  [name]  Chief complaint:   Patient being seen for sleep apnea  HPI:  Patient with sleep apnea, has been using CPAP for many years Study was over 17 years ago and his machine is old probably up to 51 years old He has been procuring his CPAP supplies online via himself Feels he uses a ResMed 11 and machine is at least greater than 5 years of Machine seems to be functioning well Has a history of asthma, on Biologics, follows up with [name] to go to bed between 9 and 10 PM Falls asleep in 30 minutes 2-3 awakenings Final wake up time about 6 AM  Quit smoking in 2013 He does have anxiety occasional productive cough  His weight has been stable he does still feel tired during the day  Does have a history of chronic cough   Not sure about his CPAP settings  He benefits from CPAP, may not be able to sleep without it   Outpatient Encounter Medications as of 09/18/2023  Medication Sig   albuterol  (PROAIR  HFA) 108 (90 Base) MCG/ACT inhaler Inhale 2 puffs into the lungs every 4 (four) hours as needed for wheezing or shortness of breath.   benzonatate  (TESSALON ) 100 MG capsule Take 1 capsule (100 mg total) by mouth 3 (three) times daily as needed for cough.   citalopram  (CELEXA ) 40 MG tablet Take 1 tablet (40 mg total) by mouth daily.   diphenhydrAMINE (BENADRYL) 25 mg capsule Take 50 mg by mouth in the morning.   Dupilumab  (DUPIXENT ) 300 MG/2ML SOAJ Inject 300 mg into the skin every 14 (fourteen) days.   Evolocumab  (REPATHA  SURECLICK) 140 MG/ML SOAJ Inject 140 mg into the skin every 14 (fourteen) days.   fluticasone  (FLONASE ) 50 MCG/ACT nasal spray Place 2 sprays into both nostrils daily.   fluticasone  furoate-vilanterol (BREO ELLIPTA ) 200-25 MCG/ACT AEPB Inhale 1 puff into the  lungs daily.   losartan  (COZAAR ) 100 MG tablet Take 1 tablet (100 mg total) by mouth daily.   methocarbamol  (ROBAXIN ) 500 MG tablet Take 1 tablet by mouth every  8 hours as needed for muscle spams.   montelukast  (SINGULAIR ) 10 MG tablet Take 1 tablet (10 mg total) by mouth daily.   pantoprazole  (PROTONIX ) 40 MG tablet Take 1 tablet (40 mg total) by mouth daily.   Semaglutide -Weight Management (WEGOVY ) 0.25 MG/0.5ML SOAJ Inject 0.25 mg into the skin once a week.   No facility-administered encounter medications on file as of 09/18/2023.    Allergies as of 09/18/2023 - Review Complete 09/18/2023  Allergen Reaction Noted   Other  07/17/2011    Past Medical History:  Diagnosis Date   Anxiety    GERD (gastroesophageal reflux disease)    OCCAS-WOULD USE OTC MED IF NEEDED   HNP (herniated nucleus pulposus), lumbar    PAIN IN LOWER BACK AND PAIN DOWN BOTH LEGS -12-24-14 radiates down right leg presently.   Hypertension    Sleep apnea    HAS CPAP-DOES NOT USE-DOES NOT KNOW SETTINGS    Past Surgical History:  Procedure Laterality Date   01/05/2011 LUMBAR DECOMPRESSION AND MICRODISCECTOMY     CERVICAL FUSION  C4-5      ESOPHAGOGASTRODUODENOSCOPY (EGD) WITH PROPOFOL  N/A 12/30/2014   Procedure: ESOPHAGOGASTRODUODENOSCOPY (EGD) WITH PROPOFOL ;  Surgeon: [name], MD;  Location: [facility];  Service: Endoscopy;  Laterality: N/A;  With Bravo   LUMBAR LAMINECTOMY/DECOMPRESSION MICRODISCECTOMY  07/26/2011   Procedure: LUMBAR LAMINECTOMY/DECOMPRESSION MICRODISCECTOMY;  Surgeon: [name], MD;  Location: [facility];  Service: Orthopedics;  Laterality: Right;  Re-Do Lumbar Decompression L5-S1 on Right,    PILONIDAL CYST EXCISION      Family History  Problem Relation Age of Onset   COPD Father    Congestive Heart Failure Father    Breast cancer Mother     Social History   Socioeconomic History   Marital status: Married    Spouse name: Not on file   Number of children: Not on file    Years of education: Not on file   Highest education level: Not on file  Occupational History   Not on file  Tobacco Use   Smoking status: Former    Current packs/day: 0.00    Average packs/day: 2.0 packs/day for 20.0 years (40.0 ttl pk-yrs)    Types: Cigarettes    Start date: 10/05/1991    Quit date: 10/05/2011    Years since quitting: 11.9   Smokeless tobacco: Former    Types: Chew  Substance and Sexual Activity   Alcohol use: Yes    Alcohol/week: 0.0 standard drinks of alcohol    Comment: 2 MIXED DRINKS (VODKA) DAILY-usually    Drug use: No   Sexual activity: Not on file  Other Topics Concern   Not on file  Social History Narrative   Not on file   Social Drivers of Health   Financial Resource Strain: Not on file  Food Insecurity: Not on file  Transportation Needs: Not on file  Physical Activity: Not on file  Stress: Not on file  Social Connections: Unknown (07/17/2021)   [facility]   Social Network    Social Network: Not on file  Intimate Partner Violence: Unknown (06/08/2021)   Received from [facility]   HITS    Physically Hurt: Not on file    Insult or Talk Down To: Not on file    Threaten Physical Harm: Not on file    Scream or Curse: Not on file    Review of Systems  Respiratory:  Positive for apnea and shortness of breath.   Psychiatric/Behavioral:  Positive for sleep disturbance.     Vitals:   09/18/23 0937 09/18/23 0938  BP:  134/68  Pulse: (!) 102   SpO2: 97% 97%     Physical Exam Constitutional:      Appearance: He is obese.  HENT:     Head: Normocephalic.     Nose: Nose normal.     Mouth/Throat:     Mouth: Mucous membranes are moist.  Eyes:     General: No scleral icterus. Cardiovascular:     Rate and Rhythm: Normal rate and regular rhythm.     Heart sounds: No murmur heard.    No friction rub.  Pulmonary:     Effort: No respiratory distress.     Breath sounds: No stridor. No wheezing or rhonchi.  Musculoskeletal:      Cervical back: No rigidity or tenderness.  Neurological:     Mental Status: He is alert.  Psychiatric:        Mood and Affect: Mood normal.       09/18/2023    9:00 AM  Results of the Epworth flowsheet  Sitting and reading 3  Watching TV 3  Sitting, inactive in a public place (  e.g. a theatre or a meeting) 2  As a passenger in a car for an hour without a break 2  Lying down to rest in the afternoon when circumstances permit 2  Sitting and talking to someone 1  Sitting quietly after a lunch without alcohol 2  In a car, while stopped for a few minutes in traffic 2  Total score 17    Data Reviewed: Sleep studies not available to be reviewed  Most recent pulmonary function test in November 2024 significant for restriction Assessment:  History of cough variant asthma  History of obstructive sleep apnea on CPAP therapy  History of abnormal CT scan with multifocal infiltrates study improved on a follow-up CT  Plan/Recommendations: Patient with a history of sleep apnea  Current machine appears to be dated about seems to be working well at present  We do not have downloads of the machine, does not know his current settings  Remains very compliant and feels he continues to benefit from the machine  Consider repeating sleep study to ascertain he still has significant sleep apnea, an order for auto titrating CPAP may be placed after a sleep study   If goal-directed We will present  Will follow-up in about 6 months  Encouraged we will to call with any significant concerns  Encouraged weight loss efforts   Jennet Epley MD [facility] 09/18/2023, 10:05 AM  CC: Dorina Loving, [name]

## 2023-09-18 NOTE — Patient Instructions (Signed)
 I will see you back in about 6 months  I do not believe we need to make any changes to your CPAP - Continue to use CPAP on a nightly basis  Continue to focus on weight loss  Call with significant concerns  If you start having any issues with the machine, we need to do a sleep study to ascertain you still have significant sleep apnea  Look up information that may help you figure out your pressure settings, there are apps that you can download that also helps you with monitoring

## 2023-09-19 ENCOUNTER — Other Ambulatory Visit: Payer: Self-pay | Admitting: Internal Medicine

## 2023-09-19 ENCOUNTER — Other Ambulatory Visit: Payer: Self-pay | Admitting: Pharmacy Technician

## 2023-09-19 ENCOUNTER — Other Ambulatory Visit: Payer: Self-pay

## 2023-09-19 DIAGNOSIS — D721 Eosinophilia, unspecified: Secondary | ICD-10-CM

## 2023-09-19 DIAGNOSIS — J4550 Severe persistent asthma, uncomplicated: Secondary | ICD-10-CM

## 2023-09-19 NOTE — Progress Notes (Signed)
 Specialty Pharmacy Refill Coordination Note  Ryan Holland is a 53 y.o. male contacted today regarding refills of specialty medication(s) Dupilumab  (Dupixent )   Patient requested Delivery   Delivery date: 09/21/23   Verified address: [address] [facility] [place]   Medication will be filled on 09/20/23.

## 2023-09-20 ENCOUNTER — Other Ambulatory Visit: Payer: Self-pay | Admitting: Pharmacist

## 2023-09-20 ENCOUNTER — Other Ambulatory Visit: Payer: Self-pay

## 2023-09-20 ENCOUNTER — Other Ambulatory Visit: Payer: Self-pay | Admitting: Cardiology

## 2023-09-20 ENCOUNTER — Other Ambulatory Visit (HOSPITAL_COMMUNITY): Payer: Self-pay

## 2023-09-20 DIAGNOSIS — D721 Eosinophilia, unspecified: Secondary | ICD-10-CM

## 2023-09-20 DIAGNOSIS — I2510 Atherosclerotic heart disease of native coronary artery without angina pectoris: Secondary | ICD-10-CM

## 2023-09-20 DIAGNOSIS — E785 Hyperlipidemia, unspecified: Secondary | ICD-10-CM

## 2023-09-20 DIAGNOSIS — J4550 Severe persistent asthma, uncomplicated: Secondary | ICD-10-CM

## 2023-09-20 MED ORDER — DUPIXENT 300 MG/2ML ~~LOC~~ SOAJ
300.0000 mg | SUBCUTANEOUS | 1 refills | Status: DC
  Filled 2023-09-21: qty 4, 28d supply, fill #0
  Filled 2023-10-17 (×2): qty 4, 28d supply, fill #1
  Filled 2023-11-20 – 2023-11-22 (×2): qty 4, 28d supply, fill #2
  Filled 2023-12-12 – 2024-01-29 (×3): qty 4, 28d supply, fill #3
  Filled 2024-03-11 – 2024-03-12 (×2): qty 4, 28d supply, fill #4

## 2023-09-20 MED ORDER — DUPIXENT 300 MG/2ML ~~LOC~~ SOAJ
300.0000 mg | SUBCUTANEOUS | 1 refills | Status: DC
  Filled 2023-09-20: qty 12, 84d supply, fill #0

## 2023-09-20 NOTE — Telephone Encounter (Signed)
 Refill sent for DUPIXENT  to [facility]: [phone]   Dose: 300mg  subcut every 14 dayas  Last OV: 09/18/2023 Provider: Dr. Neda and Dr. Geronimo  Next OV: 10/30/2023  Sherry Pennant, PharmD, MPH, BCPS Clinical Pharmacist (Rheumatology and Pulmonology)

## 2023-09-21 ENCOUNTER — Other Ambulatory Visit: Payer: Self-pay

## 2023-09-21 ENCOUNTER — Other Ambulatory Visit (HOSPITAL_COMMUNITY): Payer: Self-pay

## 2023-09-21 ENCOUNTER — Ambulatory Visit
Admission: RE | Admit: 2023-09-21 | Discharge: 2023-09-21 | Disposition: A | Discharge status: Home / Self Care | Source: Ambulatory Visit | Attending: Internal Medicine | Admitting: Internal Medicine

## 2023-09-21 DIAGNOSIS — R918 Other nonspecific abnormal finding of lung field: Secondary | ICD-10-CM | POA: Diagnosis not present

## 2023-09-21 DIAGNOSIS — R0602 Shortness of breath: Secondary | ICD-10-CM | POA: Diagnosis not present

## 2023-09-21 DIAGNOSIS — R053 Chronic cough: Secondary | ICD-10-CM | POA: Diagnosis not present

## 2023-09-21 MED ORDER — REPATHA SURECLICK 140 MG/ML ~~LOC~~ SOAJ
1.0000 mL | SUBCUTANEOUS | 1 refills | Status: AC
  Filled 2023-09-21 – 2023-11-06 (×2): qty 6, 84d supply, fill #0
  Filled 2024-01-27: qty 6, 84d supply, fill #1

## 2023-09-23 ENCOUNTER — Inpatient Hospital Stay (HOSPITAL_COMMUNITY)
Admission: EM | Admit: 2023-09-23 | Discharge: 2023-09-29 | DRG: 432 | Disposition: A | Discharge status: Home / Self Care | Attending: Internal Medicine | Admitting: Internal Medicine

## 2023-09-23 ENCOUNTER — Emergency Department (HOSPITAL_COMMUNITY)

## 2023-09-23 ENCOUNTER — Other Ambulatory Visit: Payer: Self-pay

## 2023-09-23 ENCOUNTER — Encounter (HOSPITAL_COMMUNITY): Payer: Self-pay | Admitting: Emergency Medicine

## 2023-09-23 DIAGNOSIS — J181 Lobar pneumonia, unspecified organism: Secondary | ICD-10-CM | POA: Diagnosis not present

## 2023-09-23 DIAGNOSIS — I10 Essential (primary) hypertension: Secondary | ICD-10-CM | POA: Diagnosis present

## 2023-09-23 DIAGNOSIS — F10139 Alcohol abuse with withdrawal, unspecified: Secondary | ICD-10-CM | POA: Diagnosis present

## 2023-09-23 DIAGNOSIS — E785 Hyperlipidemia, unspecified: Secondary | ICD-10-CM | POA: Diagnosis present

## 2023-09-23 DIAGNOSIS — J45901 Unspecified asthma with (acute) exacerbation: Secondary | ICD-10-CM | POA: Diagnosis present

## 2023-09-23 DIAGNOSIS — R0602 Shortness of breath: Secondary | ICD-10-CM | POA: Diagnosis not present

## 2023-09-23 DIAGNOSIS — J4551 Severe persistent asthma with (acute) exacerbation: Secondary | ICD-10-CM | POA: Diagnosis not present

## 2023-09-23 DIAGNOSIS — R0609 Other forms of dyspnea: Secondary | ICD-10-CM | POA: Diagnosis not present

## 2023-09-23 DIAGNOSIS — E66813 Obesity, class 3: Secondary | ICD-10-CM | POA: Diagnosis present

## 2023-09-23 DIAGNOSIS — R590 Localized enlarged lymph nodes: Secondary | ICD-10-CM | POA: Diagnosis not present

## 2023-09-23 DIAGNOSIS — R509 Fever, unspecified: Secondary | ICD-10-CM | POA: Diagnosis not present

## 2023-09-23 DIAGNOSIS — Z6841 Body Mass Index (BMI) 40.0 and over, adult: Secondary | ICD-10-CM | POA: Diagnosis not present

## 2023-09-23 DIAGNOSIS — K831 Obstruction of bile duct: Secondary | ICD-10-CM | POA: Diagnosis present

## 2023-09-23 DIAGNOSIS — J168 Pneumonia due to other specified infectious organisms: Secondary | ICD-10-CM | POA: Diagnosis not present

## 2023-09-23 DIAGNOSIS — J189 Pneumonia, unspecified organism: Secondary | ICD-10-CM | POA: Diagnosis not present

## 2023-09-23 DIAGNOSIS — Z87891 Personal history of nicotine dependence: Secondary | ICD-10-CM | POA: Diagnosis not present

## 2023-09-23 DIAGNOSIS — K760 Fatty (change of) liver, not elsewhere classified: Secondary | ICD-10-CM | POA: Diagnosis present

## 2023-09-23 DIAGNOSIS — R7989 Other specified abnormal findings of blood chemistry: Secondary | ICD-10-CM | POA: Diagnosis present

## 2023-09-23 DIAGNOSIS — G4733 Obstructive sleep apnea (adult) (pediatric): Secondary | ICD-10-CM | POA: Diagnosis present

## 2023-09-23 DIAGNOSIS — J9601 Acute respiratory failure with hypoxia: Secondary | ICD-10-CM | POA: Diagnosis not present

## 2023-09-23 DIAGNOSIS — D696 Thrombocytopenia, unspecified: Secondary | ICD-10-CM | POA: Diagnosis present

## 2023-09-23 DIAGNOSIS — E872 Acidosis, unspecified: Secondary | ICD-10-CM | POA: Diagnosis present

## 2023-09-23 DIAGNOSIS — R918 Other nonspecific abnormal finding of lung field: Secondary | ICD-10-CM | POA: Diagnosis not present

## 2023-09-23 DIAGNOSIS — I2489 Other forms of acute ischemic heart disease: Secondary | ICD-10-CM | POA: Diagnosis present

## 2023-09-23 DIAGNOSIS — I959 Hypotension, unspecified: Secondary | ICD-10-CM | POA: Diagnosis not present

## 2023-09-23 DIAGNOSIS — R17 Unspecified jaundice: Secondary | ICD-10-CM

## 2023-09-23 DIAGNOSIS — E871 Hypo-osmolality and hyponatremia: Secondary | ICD-10-CM | POA: Diagnosis not present

## 2023-09-23 DIAGNOSIS — Z825 Family history of asthma and other chronic lower respiratory diseases: Secondary | ICD-10-CM

## 2023-09-23 DIAGNOSIS — Z91018 Allergy to other foods: Secondary | ICD-10-CM | POA: Diagnosis not present

## 2023-09-23 DIAGNOSIS — Z8249 Family history of ischemic heart disease and other diseases of the circulatory system: Secondary | ICD-10-CM | POA: Diagnosis not present

## 2023-09-23 DIAGNOSIS — R748 Abnormal levels of other serum enzymes: Secondary | ICD-10-CM

## 2023-09-23 DIAGNOSIS — R1013 Epigastric pain: Secondary | ICD-10-CM | POA: Diagnosis not present

## 2023-09-23 DIAGNOSIS — F419 Anxiety disorder, unspecified: Secondary | ICD-10-CM | POA: Diagnosis present

## 2023-09-23 DIAGNOSIS — Z79899 Other long term (current) drug therapy: Secondary | ICD-10-CM

## 2023-09-23 DIAGNOSIS — Z981 Arthrodesis status: Secondary | ICD-10-CM | POA: Diagnosis not present

## 2023-09-23 DIAGNOSIS — F1010 Alcohol abuse, uncomplicated: Secondary | ICD-10-CM | POA: Diagnosis present

## 2023-09-23 DIAGNOSIS — Z7985 Long-term (current) use of injectable non-insulin antidiabetic drugs: Secondary | ICD-10-CM | POA: Diagnosis not present

## 2023-09-23 DIAGNOSIS — J811 Chronic pulmonary edema: Secondary | ICD-10-CM | POA: Diagnosis not present

## 2023-09-23 DIAGNOSIS — Z803 Family history of malignant neoplasm of breast: Secondary | ICD-10-CM

## 2023-09-23 DIAGNOSIS — F1090 Alcohol use, unspecified, uncomplicated: Secondary | ICD-10-CM | POA: Diagnosis not present

## 2023-09-23 DIAGNOSIS — K219 Gastro-esophageal reflux disease without esophagitis: Secondary | ICD-10-CM | POA: Diagnosis present

## 2023-09-23 DIAGNOSIS — K7010 Alcoholic hepatitis without ascites: Secondary | ICD-10-CM | POA: Diagnosis not present

## 2023-09-23 DIAGNOSIS — R161 Splenomegaly, not elsewhere classified: Secondary | ICD-10-CM | POA: Diagnosis not present

## 2023-09-23 DIAGNOSIS — R000 Tachycardia, unspecified: Secondary | ICD-10-CM | POA: Diagnosis not present

## 2023-09-23 LAB — COMPREHENSIVE METABOLIC PANEL WITH GFR
ALT: 66 U/L — ABNORMAL HIGH (ref 0–44)
AST: 242 U/L — ABNORMAL HIGH (ref 15–41)
Albumin: 2.8 g/dL — ABNORMAL LOW (ref 3.5–5.0)
Alkaline Phosphatase: 195 U/L — ABNORMAL HIGH (ref 38–126)
Anion gap: 14 (ref 5–15)
BUN: 14 mg/dL (ref 6–20)
CO2: 19 mmol/L — ABNORMAL LOW (ref 22–32)
Calcium: 8.7 mg/dL — ABNORMAL LOW (ref 8.9–10.3)
Chloride: 96 mmol/L — ABNORMAL LOW (ref 98–111)
Creatinine, Ser: 0.75 mg/dL (ref 0.61–1.24)
GFR, Estimated: >60 mL/min (ref >60)
Glucose, Bld: 93 mg/dL (ref 70–99)
Potassium: 4.1 mmol/L (ref 3.5–5.1)
Sodium: 129 mmol/L — ABNORMAL LOW (ref 135–145)
Total Bilirubin: 6.9 mg/dL — ABNORMAL HIGH (ref 0.0–1.2)
Total Protein: 7.6 g/dL (ref 6.5–8.1)

## 2023-09-23 LAB — TROPONIN I (HIGH SENSITIVITY)
Troponin I (High Sensitivity): 20 ng/L — ABNORMAL HIGH (ref <18)
Troponin I (High Sensitivity): 16 ng/L (ref <18)

## 2023-09-23 LAB — LACTIC ACID, PLASMA
Lactic Acid, Venous: 1.8 mmol/L (ref 0.5–1.9)
Lactic Acid, Venous: 2.5 mmol/L (ref 0.5–1.9)

## 2023-09-23 LAB — ACETAMINOPHEN LEVEL: Acetaminophen (Tylenol), Serum: 10 ug/mL (ref 10–30)

## 2023-09-23 LAB — CBC WITH DIFFERENTIAL/PLATELET
Abs Immature Granulocytes: 0.07 K/uL (ref 0.00–0.07)
Basophils Absolute: 0.1 K/uL (ref 0.0–0.1)
Basophils Relative: 1 %
Eosinophils Absolute: 0.1 K/uL (ref 0.0–0.5)
Eosinophils Relative: 2 %
HCT: 40.7 % (ref 39.0–52.0)
Hemoglobin: 14.3 g/dL (ref 13.0–17.0)
Immature Granulocytes: 1 %
Lymphocytes Relative: 15 %
Lymphs Abs: 1.1 K/uL (ref 0.7–4.0)
MCH: 36.7 pg — ABNORMAL HIGH (ref 26.0–34.0)
MCHC: 35.1 g/dL (ref 30.0–36.0)
MCV: 104.4 fL — ABNORMAL HIGH (ref 80.0–100.0)
Monocytes Absolute: 0.5 K/uL (ref 0.1–1.0)
Monocytes Relative: 6 %
Neutro Abs: 5.7 K/uL (ref 1.7–7.7)
Neutrophils Relative %: 75 %
Platelets: 128 K/uL — ABNORMAL LOW (ref 150–400)
RBC: 3.9 MIL/uL — ABNORMAL LOW (ref 4.22–5.81)
RDW: 13.4 % (ref 11.5–15.5)
WBC: 7.5 K/uL (ref 4.0–10.5)
nRBC: 0 % (ref 0.0–0.2)

## 2023-09-23 LAB — RESP PANEL BY RT-PCR (RSV, FLU A&B, COVID)  RVPGX2
Influenza A by PCR: NEGATIVE
Influenza B by PCR: NEGATIVE
Resp Syncytial Virus by PCR: NEGATIVE
SARS Coronavirus 2 by RT PCR: NEGATIVE

## 2023-09-23 LAB — BRAIN NATRIURETIC PEPTIDE: B Natriuretic Peptide: 74 pg/mL (ref 0.0–100.0)

## 2023-09-23 LAB — APTT: aPTT: 61 s — ABNORMAL HIGH (ref 24–36)

## 2023-09-23 LAB — PROTIME-INR
INR: 1.1 (ref 0.8–1.2)
Prothrombin Time: 14.9 s (ref 11.4–15.2)

## 2023-09-23 MED ORDER — LACTATED RINGERS IV BOLUS
1000.0000 mL | Freq: Once | INTRAVENOUS | Status: AC
  Administered 2023-09-23: 1000 mL via INTRAVENOUS

## 2023-09-23 MED ORDER — LACTATED RINGERS IV SOLN: INTRAVENOUS | Status: AC

## 2023-09-23 MED ORDER — METHYLPREDNISOLONE SODIUM SUCC 125 MG IJ SOLR
125.0000 mg | Freq: Once | INTRAMUSCULAR | Status: AC
  Administered 2023-09-23: 125 mg via INTRAVENOUS
  Filled 2023-09-23: qty 2

## 2023-09-23 MED ORDER — POLYVINYL ALCOHOL 1.4 % OP SOLN
1.0000 [drp] | OPHTHALMIC | Status: DC | PRN
  Administered 2023-09-24 – 2023-09-26 (×2): 1 [drp] via OPHTHALMIC
  Filled 2023-09-23 (×2): qty 15

## 2023-09-23 MED ORDER — IPRATROPIUM-ALBUTEROL 0.5-2.5 (3) MG/3ML IN SOLN
3.0000 mL | Freq: Once | RESPIRATORY_TRACT | Status: AC
  Administered 2023-09-23: 3 mL via RESPIRATORY_TRACT
  Filled 2023-09-23: qty 3

## 2023-09-23 MED ORDER — SODIUM CHLORIDE 0.9 % IV SOLN
500.0000 mg | INTRAVENOUS | Status: DC
  Administered 2023-09-23 – 2023-09-26 (×4): 500 mg via INTRAVENOUS
  Filled 2023-09-23 (×4): qty 5

## 2023-09-23 MED ORDER — IOHEXOL 350 MG/ML SOLN
75.0000 mL | Freq: Once | INTRAVENOUS | Status: AC | PRN
  Administered 2023-09-23: 75 mL via INTRAVENOUS

## 2023-09-23 MED ORDER — SODIUM CHLORIDE 0.9 % IV SOLN
1.0000 g | Freq: Once | INTRAVENOUS | Status: AC
  Administered 2023-09-23: 1 g via INTRAVENOUS
  Filled 2023-09-23: qty 10

## 2023-09-23 NOTE — Progress Notes (Signed)
 CODE SEPSIS - PHARMACY COMMUNICATION  **Broad Spectrum Antibiotics should be administered within 1 hour of Sepsis diagnosis**  Time Code Sepsis Called/Page Received: 20:06  Antibiotics Ordered: Ceftriaxone  and Azithromycin   Time of 1st antibiotic administration: Ceftriaxone  given at 20:22  Additional action taken by pharmacy: n/a  If necessary, Name of Provider/Nurse Contacted: n/a    Ryan Holland ,PharmD Clinical Pharmacist  09/23/2023  8:31 PM

## 2023-09-23 NOTE — ED Provider Notes (Signed)
 [place] EMERGENCY DEPARTMENT AT [facility] Provider Note   CSN: [id number] Arrival date & time: 09/23/23  1950     Patient presents with: Shortness of Breath (/)   Ryan Holland is a 53 y.o. male.   Patient is a 53 year old male who presents emergency department the chief complaint of increased shortness of breath, cough, congestion over the past 4 days.  He notes that he is currently being followed by pulmonology at this point.  He did otherwise have a CT scan 2 days ago.  He has not heard the results of this as of yet.  He notes that he has been experiencing hypoxia at home.  He does not wear oxygen at home.  He denies any associated fever, chills.  He denies any abdominal pain, nausea, vomiting, diarrhea.  He denies any dizziness, lightheadedness or syncope.   Shortness of Breath      Prior to Admission medications   Medication Sig Start Date End Date Taking? Authorizing Provider  albuterol  (PROAIR  HFA) 108 (90 Base) MCG/ACT inhaler Inhale 2 puffs into the lungs every 4 (four) hours as needed for wheezing or shortness of breath. 11/22/22   Geronimo Amel, MD  benzonatate  (TESSALON ) 100 MG capsule Take 1 capsule (100 mg total) by mouth 3 (three) times daily as needed for cough. 08/17/23   Saguier, [place], [name]  citalopram  (CELEXA ) 40 MG tablet Take 1 tablet (40 mg total) by mouth daily. 08/16/23   Pavero, Lonni, RPH  diphenhydrAMINE (BENADRYL) 25 mg capsule Take 50 mg by mouth in the morning.    [provider]  Dupilumab  (DUPIXENT ) 300 MG/2ML SOAJ Inject 300 mg into the skin every 14 (fourteen) days. 09/20/23   Jegede, Olugbemiga E, MD  Evolocumab  (REPATHA  SURECLICK) 140 MG/ML SOAJ Inject 140 mg into the skin every 14 (fourteen) days. 09/21/23   Kate Lonni CROME, MD  fluticasone  (FLONASE ) 50 MCG/ACT nasal spray Place 2 sprays into both nostrils daily. 09/27/22   Saguier, Dallas, [name]  fluticasone  furoate-vilanterol (BREO ELLIPTA ) 200-25 MCG/ACT AEPB  Inhale 1 puff into the lungs daily. 06/19/23   Geronimo Amel, MD  losartan  (COZAAR ) 100 MG tablet Take 1 tablet (100 mg total) by mouth daily. 08/16/23   Pavero, Lonni, RPH  methocarbamol  (ROBAXIN ) 500 MG tablet Take 1 tablet by mouth every  8 hours as needed for muscle spams. 08/17/23   Saguier, Dallas, [name]  montelukast  (SINGULAIR ) 10 MG tablet Take 1 tablet (10 mg total) by mouth daily. 04/09/23   Saguier, Dallas, [name]  pantoprazole  (PROTONIX ) 40 MG tablet Take 1 tablet (40 mg total) by mouth daily. 04/09/23   Saguier, Dallas, [name]  Semaglutide -Weight Management (WEGOVY ) 0.25 MG/0.5ML SOAJ Inject 0.25 mg into the skin once a week. 08/14/23   Saguier, Dallas, [name]    Allergies: Other    Review of Systems  Respiratory:  Positive for shortness of breath.   All other systems reviewed and are negative.   Updated Vital Signs BP (!) 106/52   Pulse (!) 107   Temp 98 F (36.7 C) (Oral)   Resp 20   Ht 5' 10 (1.778 m)   Wt 127.9 kg   SpO2 95%   BMI 40.46 kg/m   Physical Exam Vitals and nursing note reviewed.  Constitutional:      Appearance: Normal appearance.  HENT:     Head: Normocephalic and atraumatic.     Nose: Nose normal.     Mouth/Throat:     Mouth: Mucous membranes are moist.  Eyes:  Extraocular Movements: Extraocular movements intact.     Conjunctiva/sclera: Conjunctivae normal.     Pupils: Pupils are equal, round, and reactive to light.  Cardiovascular:     Rate and Rhythm: Normal rate and regular rhythm.     Pulses: Normal pulses.     Heart sounds: Normal heart sounds. No murmur heard.    No gallop.  Pulmonary:     Effort: Pulmonary effort is normal. Tachypnea present. No respiratory distress.     Breath sounds: Normal breath sounds. No decreased breath sounds, wheezing, rhonchi or rales.  Chest:     Chest wall: No tenderness.  Abdominal:     General: Abdomen is flat. Bowel sounds are normal.     Palpations: Abdomen is soft. There is no hepatomegaly,  splenomegaly or mass.     Tenderness: There is no abdominal tenderness. There is no guarding.  Musculoskeletal:        General: Normal range of motion.     Cervical back: Normal range of motion and neck supple.     Right lower leg: No edema.     Left lower leg: No edema.  Skin:    General: Skin is warm and dry.     Comments: Jaundiced  Neurological:     General: No focal deficit present.     Mental Status: He is alert and oriented to person, place, and time. Mental status is at baseline.  Psychiatric:        Mood and Affect: Mood normal.        Behavior: Behavior normal.        Thought Content: Thought content normal.        Judgment: Judgment normal.     (all labs ordered are listed, but only abnormal results are displayed) Labs Reviewed  COMPREHENSIVE METABOLIC PANEL WITH GFR - Abnormal; Notable for the following components:      Result Value   Sodium 129 (*)    Chloride 96 (*)    CO2 19 (*)    Calcium  8.7 (*)    Albumin 2.8 (*)    AST 242 (*)    ALT 66 (*)    Alkaline Phosphatase 195 (*)    Total Bilirubin 6.9 (*)    All other components within normal limits  CBC WITH DIFFERENTIAL/PLATELET - Abnormal; Notable for the following components:   RBC 3.90 (*)    MCV 104.4 (*)    MCH 36.7 (*)    Platelets 128 (*)    All other components within normal limits  TROPONIN I (HIGH SENSITIVITY) - Abnormal; Notable for the following components:   Troponin I (High Sensitivity) 20 (*)    All other components within normal limits  RESP PANEL BY RT-PCR (RSV, FLU A&B, COVID)  RVPGX2  CULTURE, BLOOD (ROUTINE X 2)  CULTURE, BLOOD (ROUTINE X 2)  LACTIC ACID, PLASMA  PROTIME-INR  BRAIN NATRIURETIC PEPTIDE  LACTIC ACID, PLASMA  URINALYSIS, W/ REFLEX TO CULTURE (INFECTION SUSPECTED)  HEPATITIS PANEL, ACUTE  ACETAMINOPHEN  LEVEL  APTT  TROPONIN I (HIGH SENSITIVITY)    EKG: None  Radiology: CT Angio Chest PE W/Cm &/Or Wo Cm Result Date: 09/23/2023 CLINICAL DATA:  Pulmonary  embolism (PE) suspected, high prob; Epigastric pain c/o increased sob and not feeling good since Friday. When ems arrived pt o2 sat was in 80s. They placed pt on NRB @12lpm  Epigastric pain EXAM: CT ANGIOGRAPHY CHEST CT ABDOMEN AND PELVIS WITH CONTRAST TECHNIQUE: Multidetector CT imaging of the chest was performed using the standard  protocol during bolus administration of intravenous contrast. Multiplanar CT image reconstructions and MIPs were obtained to evaluate the vascular anatomy. Multidetector CT imaging of the abdomen and pelvis was performed using the standard protocol during bolus administration of intravenous contrast. RADIATION DOSE REDUCTION: This exam was performed according to the departmental dose-optimization program which includes automated exposure control, adjustment of the mA and/or kV according to patient size and/or use of iterative reconstruction technique. CONTRAST:  75mL OMNIPAQUE  IOHEXOL  350 MG/ML SOLN COMPARISON:  Chest x-ray 09/23/2023, CT chest 12/12/2022 FINDINGS: CTA CHEST FINDINGS Cardiovascular: Satisfactory opacification of the pulmonary arteries to the segmental level. No evidence of pulmonary embolism. Normal heart size. No significant pericardial effusion. The thoracic aorta is normal in caliber. No atherosclerotic plaque of the thoracic aorta. No coronary artery calcifications. Mediastinum/Nodes: Several prominent nonenlarged mediastinal lymph nodes with as an example a 1.1 cm precarinal lymph node (2:41). No hilar or axillary lymph nodes. Thyroid  gland, trachea, and esophagus demonstrate no significant findings. Lungs/Pleura: Interval development of diffuse, lower lung zone predominant, peribronchovascular patchy airspace opacities. No pulmonary nodule. No pulmonary mass. No pleural effusion. No pneumothorax. Musculoskeletal: No chest wall abnormality. No suspicious lytic or blastic osseous lesions. No acute displaced fracture. Multilevel degenerative changes of the spine.  Review of the MIP images confirms the above findings. CT ABDOMEN and PELVIS FINDINGS Hepatobiliary: No focal liver abnormality. No gallstones, gallbladder wall thickening, or pericholecystic fluid. No biliary dilatation. Pancreas: Diffusely atrophic. No focal lesion. Otherwise normal pancreatic contour. No surrounding inflammatory changes. No main pancreatic ductal dilatation. Spleen: Spleen enlarged measuring up to 17 cm. No focal splenic lesion. Adrenals/Urinary Tract: No adrenal nodule bilaterally. Bilateral kidneys enhance symmetrically. No hydronephrosis. No hydroureter. The urinary bladder is unremarkable. On delayed imaging, there is no urothelial wall thickening and there are no filling defects in the opacified portions of the bilateral collecting systems or ureters. Stomach/Bowel: Stomach is within normal limits. No evidence of bowel wall thickening or dilatation. Appendix appears normal. Vascular/Lymphatic: No abdominal aorta or iliac aneurysm. Mild atherosclerotic plaque of the aorta and its branches. No abdominal, pelvic, or inguinal lymphadenopathy. Reproductive: Prostate is unremarkable. Other: No intraperitoneal free fluid. No intraperitoneal free gas. No organized fluid collection. Musculoskeletal: No abdominal wall hernia or abnormality. No suspicious lytic or blastic osseous lesions. No acute displaced fracture. Multilevel degenerative changes of the spine. Review of the MIP images confirms the above findings. IMPRESSION: 1. Interval development of diffuse, lower lung zone predominant, peribronchovascular patchy airspace opacities. Findings may represente infection/inflammation (COVID-19 not excluded) versus alveolar hemorrhage. 2. Mediastinal lymphadenopathy. Finding likely reactive in etiology. Recommend attention on follow-up. 3. Splenomegaly. 4.  Aortic Atherosclerosis (ICD10-I70.0). Electronically Signed   By: Morgane  Naveau M.D.   On: 09/23/2023 21:26   CT ABDOMEN PELVIS W CONTRAST Result  Date: 09/23/2023 CLINICAL DATA:  Pulmonary embolism (PE) suspected, high prob; Epigastric pain c/o increased sob and not feeling good since Friday. When ems arrived pt o2 sat was in 80s. They placed pt on NRB @12lpm  Epigastric pain EXAM: CT ANGIOGRAPHY CHEST CT ABDOMEN AND PELVIS WITH CONTRAST TECHNIQUE: Multidetector CT imaging of the chest was performed using the standard protocol during bolus administration of intravenous contrast. Multiplanar CT image reconstructions and MIPs were obtained to evaluate the vascular anatomy. Multidetector CT imaging of the abdomen and pelvis was performed using the standard protocol during bolus administration of intravenous contrast. RADIATION DOSE REDUCTION: This exam was performed according to the departmental dose-optimization program which includes automated exposure control, adjustment of the mA and/or  kV according to patient size and/or use of iterative reconstruction technique. CONTRAST:  75mL OMNIPAQUE  IOHEXOL  350 MG/ML SOLN COMPARISON:  Chest x-ray 09/23/2023, CT chest 12/12/2022 FINDINGS: CTA CHEST FINDINGS Cardiovascular: Satisfactory opacification of the pulmonary arteries to the segmental level. No evidence of pulmonary embolism. Normal heart size. No significant pericardial effusion. The thoracic aorta is normal in caliber. No atherosclerotic plaque of the thoracic aorta. No coronary artery calcifications. Mediastinum/Nodes: Several prominent nonenlarged mediastinal lymph nodes with as an example a 1.1 cm precarinal lymph node (2:41). No hilar or axillary lymph nodes. Thyroid  gland, trachea, and esophagus demonstrate no significant findings. Lungs/Pleura: Interval development of diffuse, lower lung zone predominant, peribronchovascular patchy airspace opacities. No pulmonary nodule. No pulmonary mass. No pleural effusion. No pneumothorax. Musculoskeletal: No chest wall abnormality. No suspicious lytic or blastic osseous lesions. No acute displaced fracture.  Multilevel degenerative changes of the spine. Review of the MIP images confirms the above findings. CT ABDOMEN and PELVIS FINDINGS Hepatobiliary: No focal liver abnormality. No gallstones, gallbladder wall thickening, or pericholecystic fluid. No biliary dilatation. Pancreas: Diffusely atrophic. No focal lesion. Otherwise normal pancreatic contour. No surrounding inflammatory changes. No main pancreatic ductal dilatation. Spleen: Spleen enlarged measuring up to 17 cm. No focal splenic lesion. Adrenals/Urinary Tract: No adrenal nodule bilaterally. Bilateral kidneys enhance symmetrically. No hydronephrosis. No hydroureter. The urinary bladder is unremarkable. On delayed imaging, there is no urothelial wall thickening and there are no filling defects in the opacified portions of the bilateral collecting systems or ureters. Stomach/Bowel: Stomach is within normal limits. No evidence of bowel wall thickening or dilatation. Appendix appears normal. Vascular/Lymphatic: No abdominal aorta or iliac aneurysm. Mild atherosclerotic plaque of the aorta and its branches. No abdominal, pelvic, or inguinal lymphadenopathy. Reproductive: Prostate is unremarkable. Other: No intraperitoneal free fluid. No intraperitoneal free gas. No organized fluid collection. Musculoskeletal: No abdominal wall hernia or abnormality. No suspicious lytic or blastic osseous lesions. No acute displaced fracture. Multilevel degenerative changes of the spine. Review of the MIP images confirms the above findings. IMPRESSION: 1. Interval development of diffuse, lower lung zone predominant, peribronchovascular patchy airspace opacities. Findings may represente infection/inflammation (COVID-19 not excluded) versus alveolar hemorrhage. 2. Mediastinal lymphadenopathy. Finding likely reactive in etiology. Recommend attention on follow-up. 3. Splenomegaly. 4.  Aortic Atherosclerosis (ICD10-I70.0). Electronically Signed   By: Morgane  Naveau M.D.   On: 09/23/2023  21:26   DG Chest Port 1 View Result Date: 09/23/2023 EXAM: 1 VIEW XRAY OF THE CHEST 09/23/2023 08:23:00 PM COMPARISON: CT chest dated 09/21/2023. CLINICAL HISTORY: Questionable sepsis - evaluate for abnormality. Per triage SOB and not feeling good since Friday. When EMS arrived pt O2 sat was in 80s. FINDINGS: LUNGS AND PLEURA: Mild patchy interstitial/airspace opacities, lower lungs predominant, favoring interstitial/alveolar edema over pneumonia given rapid change. No pneumothorax. HEART AND MEDIASTINUM: Cardiomegaly. BONES AND SOFT TISSUES: No acute osseous abnormality. IMPRESSION: 1. Mild patchy interstitial/airspace opacities, favoring interstitial/alveolar edema over pneumonia. 2. Cardiomegaly. Electronically signed by: Pinkie Pebbles MD 09/23/2023 08:26 PM EDT RP Workstation: [id number]     .Critical Care  Performed by: Daralene Lonni BIRCH, [name] Authorized by: Daralene Lonni BIRCH, [name]   Critical care provider statement:    Critical care time (minutes):  35   Critical care was necessary to treat or prevent imminent or life-threatening deterioration of the following conditions:  Respiratory failure   Critical care was time spent personally by me on the following activities:  Development of treatment plan with patient or surrogate, discussions with consultants, evaluation of patient's response  to treatment, examination of patient, ordering and review of laboratory studies, ordering and review of radiographic studies, ordering and performing treatments and interventions, pulse oximetry, re-evaluation of patient's condition and review of old charts   I assumed direction of critical care for this patient from another provider in my specialty: no     Care discussed with: admitting provider      Medications Ordered in the ED  lactated ringers  infusion ( Intravenous New Bag/Given 09/23/23 2023)  azithromycin  (ZITHROMAX ) 500 mg in sodium chloride  0.9 % 250 mL IVPB (0 mg Intravenous Stopped  09/23/23 2123)  artificial tears ophthalmic solution 1 drop (has no administration in time range)  cefTRIAXone  (ROCEPHIN ) 1 g in sodium chloride  0.9 % 100 mL IVPB (0 g Intravenous Stopped 09/23/23 2046)  ipratropium-albuterol  (DUONEB) 0.5-2.5 (3) MG/3ML nebulizer solution 3 mL (3 mLs Nebulization Given 09/23/23 2110)  ipratropium-albuterol  (DUONEB) 0.5-2.5 (3) MG/3ML nebulizer solution 3 mL (3 mLs Nebulization Given 09/23/23 2109)  iohexol  (OMNIPAQUE ) 350 MG/ML injection 75 mL (75 mLs Intravenous Contrast Given 09/23/23 2053)                                    Medical Decision Making Amount and/or Complexity of Data Reviewed Labs: ordered. Radiology: ordered.  Risk OTC drugs. Prescription drug management. Decision regarding hospitalization.   This patient presents to the ED for concern of dyspnea, this involves an extensive number of treatment options, and is a complaint that carries with it a high risk of complications and morbidity.  The differential diagnosis includes COPD, asthma, CHF, sepsis, pneumonia, pneumothorax, hemothorax, ACS, pulmonary embolus, pericarditis, myocarditis, endocarditis, aortic aneurysm or dissection   Co morbidities that complicate the patient evaluation  Asthma   Additional history obtained:  Additional history obtained from medical records External records from outside source obtained and reviewed including medical records   Lab Tests:  I Ordered, and personally interpreted labs.  The pertinent results include: No leukocytosis, no anemia, mild thrombocytopenia, mild hyponatremia, normal creatinine, elevated liver function and bilirubin, downtrending troponin, normal BNP, elevated lactic acid, negative viral swab   Imaging Studies ordered:  I ordered imaging studies including CTA chest, CT abdomen and pelvis I independently visualized and interpreted imaging which showed bilateral lower lobe patchy airspace disease, no acute intra-abdominal surgical  process I agree with the radiologist interpretation   Cardiac Monitoring: / EKG:  The patient was maintained on a cardiac monitor.  I personally viewed and interpreted the cardiac monitored which showed an underlying rhythm of: Sinus tachycardia, no ST/T wave changes, no ischemic changes, no STEMI   Consultations Obtained:  I requested consultation with the gastroenterology, Dr. Eartha,  and discussed lab and imaging findings as well as pertinent plan - they recommend: Acute hepatic panel, right upper quadrant ultrasound, will consult   Problem List / ED Course / Critical interventions / Medication management  Patient is doing well at this time and does remain stable.  He is maintaining his oxygen on 4 L at this time.  He does not wear oxygen at home.  CT scan of the chest was concerning for patchy airspace disease in bilateral lower lobes.  Have utilized to judicious IV hydration given concern for possible pulmonary edema at this point.  Did discuss elevated liver enzymes and bilirubin function with Dr. Eartha with gastroenterology who did recommend an acute hepatitis panel, right upper quadrant ultrasound and he will see the patient in  consult.  CT scan of the abdomen pelvis demonstrated no signs of acute process to include cholecystitis, choledocholithiasis.  Have discussed patient case with Dr. Shona with the hospitalist service who has excepted for admission. I ordered medication including IV fluids, Rocephin , azithromycin , DuoNeb, Solu-Medrol  for pneumonia Reevaluation of the patient after these medicines showed that the patient improved I have reviewed the patients home medicines and have made adjustments as needed   Social Determinants of Health:  None   Test / Admission - Considered:  Admission     Final diagnoses:  None    ED Discharge Orders     None          Daralene Lonni JONETTA DEVONNA 09/23/23 2344    Suzette Pac, MD 09/25/23 1251

## 2023-09-23 NOTE — ED Notes (Signed)
 Dr Eartha paged x2 to PA Rigney @ [phone]

## 2023-09-23 NOTE — ED Triage Notes (Signed)
 Pt c/o increased sob and not feeling good since Friday. When ems arrived pt o2 sat was in 80s. They placed pt on NRB @12lpm 

## 2023-09-23 NOTE — Sepsis Progress Note (Signed)
 Elink following for sepsis protocol.

## 2023-09-24 ENCOUNTER — Other Ambulatory Visit: Payer: Self-pay

## 2023-09-24 ENCOUNTER — Encounter (HOSPITAL_COMMUNITY): Payer: Self-pay | Admitting: Internal Medicine

## 2023-09-24 ENCOUNTER — Inpatient Hospital Stay (HOSPITAL_COMMUNITY)

## 2023-09-24 DIAGNOSIS — Z87891 Personal history of nicotine dependence: Secondary | ICD-10-CM | POA: Diagnosis not present

## 2023-09-24 DIAGNOSIS — J9601 Acute respiratory failure with hypoxia: Secondary | ICD-10-CM | POA: Diagnosis not present

## 2023-09-24 DIAGNOSIS — F1010 Alcohol abuse, uncomplicated: Secondary | ICD-10-CM | POA: Diagnosis not present

## 2023-09-24 DIAGNOSIS — R7989 Other specified abnormal findings of blood chemistry: Secondary | ICD-10-CM | POA: Diagnosis not present

## 2023-09-24 LAB — CBC WITH DIFFERENTIAL/PLATELET
Abs Immature Granulocytes: 0.1 K/uL — ABNORMAL HIGH (ref 0.00–0.07)
Basophils Absolute: 0.1 K/uL (ref 0.0–0.1)
Basophils Relative: 1 %
Eosinophils Absolute: 0 K/uL (ref 0.0–0.5)
Eosinophils Relative: 1 %
HCT: 38.3 % — ABNORMAL LOW (ref 39.0–52.0)
Hemoglobin: 13.1 g/dL (ref 13.0–17.0)
Immature Granulocytes: 2 %
Lymphocytes Relative: 8 %
Lymphs Abs: 0.5 K/uL — ABNORMAL LOW (ref 0.7–4.0)
MCH: 36.1 pg — ABNORMAL HIGH (ref 26.0–34.0)
MCHC: 34.2 g/dL (ref 30.0–36.0)
MCV: 105.5 fL — ABNORMAL HIGH (ref 80.0–100.0)
Monocytes Absolute: 0.3 K/uL (ref 0.1–1.0)
Monocytes Relative: 4 %
Neutro Abs: 5.6 K/uL (ref 1.7–7.7)
Neutrophils Relative %: 84 %
Platelets: 118 K/uL — ABNORMAL LOW (ref 150–400)
RBC: 3.63 MIL/uL — ABNORMAL LOW (ref 4.22–5.81)
RDW: 13.4 % (ref 11.5–15.5)
Smear Review: ADEQUATE
WBC: 6.6 K/uL (ref 4.0–10.5)
nRBC: 0 % (ref 0.0–0.2)

## 2023-09-24 LAB — OSMOLALITY, URINE: Osmolality, Ur: 730 mosm/kg (ref 300–900)

## 2023-09-24 LAB — URINALYSIS, W/ REFLEX TO CULTURE (INFECTION SUSPECTED)
Bacteria, UA: NONE SEEN
Glucose, UA: NEGATIVE mg/dL
Hgb urine dipstick: NEGATIVE
Ketones, ur: NEGATIVE mg/dL
Leukocytes,Ua: NEGATIVE
Nitrite: NEGATIVE
Protein, ur: NEGATIVE mg/dL
Specific Gravity, Urine: >1.046 — ABNORMAL HIGH (ref 1.005–1.030)
pH: 6 (ref 5.0–8.0)

## 2023-09-24 LAB — COMPREHENSIVE METABOLIC PANEL WITH GFR
ALT: 60 U/L — ABNORMAL HIGH (ref 0–44)
AST: 228 U/L — ABNORMAL HIGH (ref 15–41)
Albumin: 2.7 g/dL — ABNORMAL LOW (ref 3.5–5.0)
Alkaline Phosphatase: 189 U/L — ABNORMAL HIGH (ref 38–126)
Anion gap: 14 (ref 5–15)
BUN: 13 mg/dL (ref 6–20)
CO2: 19 mmol/L — ABNORMAL LOW (ref 22–32)
Calcium: 8.4 mg/dL — ABNORMAL LOW (ref 8.9–10.3)
Chloride: 95 mmol/L — ABNORMAL LOW (ref 98–111)
Creatinine, Ser: 0.79 mg/dL (ref 0.61–1.24)
GFR, Estimated: >60 mL/min (ref >60)
Glucose, Bld: 126 mg/dL — ABNORMAL HIGH (ref 70–99)
Potassium: 3.9 mmol/L (ref 3.5–5.1)
Sodium: 128 mmol/L — ABNORMAL LOW (ref 135–145)
Total Bilirubin: 6.6 mg/dL — ABNORMAL HIGH (ref 0.0–1.2)
Total Protein: 7.2 g/dL (ref 6.5–8.1)

## 2023-09-24 LAB — HEPATITIS PANEL, ACUTE
HCV Ab: NONREACTIVE
Hep A IgM: NONREACTIVE
Hep B C IgM: NONREACTIVE
Hepatitis B Surface Ag: NONREACTIVE

## 2023-09-24 LAB — HEMOGLOBIN A1C
Hgb A1c MFr Bld: 4.2 % — ABNORMAL LOW (ref 4.8–5.6)
Mean Plasma Glucose: 73.84 mg/dL

## 2023-09-24 LAB — PHOSPHORUS: Phosphorus: 2.2 mg/dL — ABNORMAL LOW (ref 2.5–4.6)

## 2023-09-24 LAB — HIV ANTIBODY (ROUTINE TESTING W REFLEX): HIV Screen 4th Generation wRfx: NONREACTIVE

## 2023-09-24 LAB — MRSA NEXT GEN BY PCR, NASAL: MRSA by PCR Next Gen: NOT DETECTED

## 2023-09-24 LAB — OSMOLALITY: Osmolality: 289 mosm/kg (ref 275–295)

## 2023-09-24 LAB — SODIUM, URINE, RANDOM: Sodium, Ur: 56 mmol/L

## 2023-09-24 LAB — LACTIC ACID, PLASMA
Lactic Acid, Venous: 2 mmol/L (ref 0.5–1.9)
Lactic Acid, Venous: 1.6 mmol/L (ref 0.5–1.9)

## 2023-09-24 LAB — PROCALCITONIN: Procalcitonin: 0.88 ng/mL

## 2023-09-24 LAB — MAGNESIUM: Magnesium: 1.4 mg/dL — ABNORMAL LOW (ref 1.7–2.4)

## 2023-09-24 MED ORDER — GUAIFENESIN-DM 100-10 MG/5ML PO SYRP
5.0000 mL | ORAL_SOLUTION | ORAL | Status: DC | PRN
  Administered 2023-09-24 – 2023-09-26 (×4): 5 mL via ORAL
  Filled 2023-09-24 (×4): qty 5

## 2023-09-24 MED ORDER — ALBUTEROL SULFATE (2.5 MG/3ML) 0.083% IN NEBU: 2.5000 mg | INHALATION_SOLUTION | Freq: Four times a day (QID) | RESPIRATORY_TRACT | Status: DC | PRN

## 2023-09-24 MED ORDER — SODIUM CHLORIDE 0.9 % IV SOLN
2.0000 g | INTRAVENOUS | Status: DC
  Administered 2023-09-24 – 2023-09-27 (×4): 2 g via INTRAVENOUS
  Filled 2023-09-24 (×5): qty 20

## 2023-09-24 MED ORDER — LORAZEPAM 1 MG PO TABS: 1.0000 mg | ORAL_TABLET | ORAL | Status: AC | PRN

## 2023-09-24 MED ORDER — CHLORHEXIDINE GLUCONATE CLOTH 2 % EX PADS: 6.0000 | MEDICATED_PAD | Freq: Every day | CUTANEOUS | Status: DC

## 2023-09-24 MED ORDER — THIAMINE MONONITRATE 100 MG PO TABS
100.0000 mg | ORAL_TABLET | Freq: Every day | ORAL | Status: DC
  Administered 2023-09-24 – 2023-09-29 (×6): 100 mg via ORAL
  Filled 2023-09-24 (×6): qty 1

## 2023-09-24 MED ORDER — IPRATROPIUM-ALBUTEROL 0.5-2.5 (3) MG/3ML IN SOLN
3.0000 mL | Freq: Three times a day (TID) | RESPIRATORY_TRACT | Status: DC
  Administered 2023-09-25 – 2023-09-29 (×14): 3 mL via RESPIRATORY_TRACT
  Filled 2023-09-24 (×14): qty 3

## 2023-09-24 MED ORDER — LORAZEPAM 2 MG/ML IJ SOLN: 1.0000 mg | INTRAMUSCULAR | Status: AC | PRN

## 2023-09-24 MED ORDER — LOSARTAN POTASSIUM 50 MG PO TABS: 100.0000 mg | ORAL_TABLET | Freq: Every day | ORAL | Status: DC

## 2023-09-24 MED ORDER — CHLORDIAZEPOXIDE HCL 25 MG PO CAPS
25.0000 mg | ORAL_CAPSULE | Freq: Three times a day (TID) | ORAL | Status: AC
  Administered 2023-09-24 (×2): 25 mg via ORAL
  Filled 2023-09-24 (×2): qty 1

## 2023-09-24 MED ORDER — POLYETHYLENE GLYCOL 3350 17 G PO PACK: 17.0000 g | PACK | Freq: Every day | ORAL | Status: DC | PRN

## 2023-09-24 MED ORDER — PROCHLORPERAZINE EDISYLATE 10 MG/2ML IJ SOLN: 5.0000 mg | Freq: Four times a day (QID) | INTRAMUSCULAR | Status: DC | PRN

## 2023-09-24 MED ORDER — CHLORDIAZEPOXIDE HCL 5 MG PO CAPS
10.0000 mg | ORAL_CAPSULE | Freq: Three times a day (TID) | ORAL | Status: AC
  Administered 2023-09-25 – 2023-09-26 (×6): 10 mg via ORAL
  Filled 2023-09-24 (×6): qty 2

## 2023-09-24 MED ORDER — ADULT MULTIVITAMIN W/MINERALS CH
1.0000 | ORAL_TABLET | Freq: Every day | ORAL | Status: DC
  Administered 2023-09-24 – 2023-09-29 (×6): 1 via ORAL
  Filled 2023-09-24 (×6): qty 1

## 2023-09-24 MED ORDER — CITALOPRAM HYDROBROMIDE 20 MG PO TABS
40.0000 mg | ORAL_TABLET | Freq: Every day | ORAL | Status: DC
Start: 2023-09-24 — End: 2023-09-29
  Administered 2023-09-24 – 2023-09-29 (×6): 40 mg via ORAL
  Filled 2023-09-24 (×6): qty 2

## 2023-09-24 MED ORDER — LORATADINE 10 MG PO TABS: 10.0000 mg | ORAL_TABLET | Freq: Every day | ORAL | Status: DC | PRN

## 2023-09-24 MED ORDER — FLUTICASONE FUROATE-VILANTEROL 200-25 MCG/ACT IN AEPB
1.0000 | INHALATION_SPRAY | Freq: Every day | RESPIRATORY_TRACT | Status: DC
  Administered 2023-09-24 – 2023-09-26 (×2): 1 via RESPIRATORY_TRACT
  Filled 2023-09-24 (×2): qty 28

## 2023-09-24 MED ORDER — MELATONIN 3 MG PO TABS
6.0000 mg | ORAL_TABLET | Freq: Every evening | ORAL | Status: DC | PRN
  Administered 2023-09-24 – 2023-09-26 (×3): 6 mg via ORAL
  Filled 2023-09-24 (×3): qty 2

## 2023-09-24 MED ORDER — PANTOPRAZOLE SODIUM 40 MG PO TBEC
40.0000 mg | DELAYED_RELEASE_TABLET | Freq: Every day | ORAL | Status: DC
Start: 2023-09-24 — End: 2023-09-29
  Administered 2023-09-24 – 2023-09-29 (×6): 40 mg via ORAL
  Filled 2023-09-24 (×6): qty 1

## 2023-09-24 MED ORDER — METHYLPREDNISOLONE SODIUM SUCC 40 MG IJ SOLR
40.0000 mg | Freq: Every day | INTRAMUSCULAR | Status: AC
  Administered 2023-09-24 – 2023-09-26 (×3): 40 mg via INTRAVENOUS
  Filled 2023-09-24 (×3): qty 1

## 2023-09-24 MED ORDER — KETOROLAC TROMETHAMINE 30 MG/ML IJ SOLN
30.0000 mg | INTRAMUSCULAR | Status: AC
  Administered 2023-09-24: 30 mg via INTRAVENOUS
  Filled 2023-09-24: qty 1

## 2023-09-24 MED ORDER — MONTELUKAST SODIUM 10 MG PO TABS
10.0000 mg | ORAL_TABLET | Freq: Every day | ORAL | Status: DC
  Administered 2023-09-24 – 2023-09-28 (×6): 10 mg via ORAL
  Filled 2023-09-24 (×6): qty 1

## 2023-09-24 MED ORDER — IPRATROPIUM-ALBUTEROL 0.5-2.5 (3) MG/3ML IN SOLN
3.0000 mL | RESPIRATORY_TRACT | Status: DC
  Administered 2023-09-24 (×2): 3 mL via RESPIRATORY_TRACT
  Filled 2023-09-24 (×2): qty 3

## 2023-09-24 MED ORDER — MAGNESIUM SULFATE 4 GM/100ML IV SOLN
4.0000 g | Freq: Once | INTRAVENOUS | Status: AC
  Administered 2023-09-24: 4 g via INTRAVENOUS
  Filled 2023-09-24: qty 100

## 2023-09-24 MED ORDER — FOLIC ACID 1 MG PO TABS
1.0000 mg | ORAL_TABLET | Freq: Every day | ORAL | Status: DC
  Administered 2023-09-24 – 2023-09-29 (×6): 1 mg via ORAL
  Filled 2023-09-24 (×6): qty 1

## 2023-09-24 MED ORDER — THIAMINE HCL 100 MG/ML IJ SOLN: 100.0000 mg | Freq: Every day | INTRAMUSCULAR | Status: DC

## 2023-09-24 MED ORDER — LOSARTAN POTASSIUM 25 MG PO TABS
25.0000 mg | ORAL_TABLET | Freq: Every day | ORAL | Status: DC
  Administered 2023-09-24: 25 mg via ORAL
  Filled 2023-09-24 (×3): qty 1

## 2023-09-24 MED ORDER — METHOCARBAMOL 500 MG PO TABS
500.0000 mg | ORAL_TABLET | Freq: Three times a day (TID) | ORAL | Status: DC | PRN
  Administered 2023-09-24 – 2023-09-28 (×8): 500 mg via ORAL
  Filled 2023-09-24 (×8): qty 1

## 2023-09-24 MED ORDER — IPRATROPIUM-ALBUTEROL 0.5-2.5 (3) MG/3ML IN SOLN
3.0000 mL | Freq: Four times a day (QID) | RESPIRATORY_TRACT | Status: DC
  Administered 2023-09-24 (×2): 3 mL via RESPIRATORY_TRACT
  Filled 2023-09-24 (×2): qty 3

## 2023-09-24 MED ORDER — CHLORDIAZEPOXIDE HCL 5 MG PO CAPS
5.0000 mg | ORAL_CAPSULE | Freq: Three times a day (TID) | ORAL | Status: AC
  Administered 2023-09-27 (×3): 5 mg via ORAL
  Filled 2023-09-24 (×3): qty 1

## 2023-09-24 NOTE — Progress Notes (Deleted)
   09/24/23 2143  BiPAP/CPAP/SIPAP  BiPAP/CPAP/SIPAP Pt Type Adult  BiPAP/CPAP/SIPAP DREAMSTATIOND  Mask Type Full face mask  Mask Size Large  EPAP 16 cmH2O  Flow Rate 5 lpm  Patient Home Machine No  Patient Home Mask No  Patient Home Tubing No  Auto Titrate No  BiPAP/CPAP /SiPAP Vitals  Pulse Rate 93  Resp 18  SpO2 92 %  Bilateral Breath Sounds Clear;Diminished  MEWS Score/Color  MEWS Score 0  MEWS Score Color Land[place]

## 2023-09-24 NOTE — Progress Notes (Signed)
 Placed on CPAP 16 cm with 4 liters bled into machine. He thinks he wears a CPAP of 13 which we tried but didn't think it was enough , pressure increased to 16.

## 2023-09-24 NOTE — Progress Notes (Signed)
 ASSUMPTION OF CARE NOTE   09/24/2023 1:29 PM  Ryan Holland was seen and examined.  The H&P by the admitting provider, orders, imaging was reviewed.  Please see new orders.  Will continue to follow.   Vitals:   09/24/23 1158 09/24/23 1200  BP:  122/65  Pulse:  91  Resp:  (!) 22  Temp: 97.8 F (36.6 C)   SpO2:  92%    Results for orders placed or performed during the hospital encounter of 09/23/23  Blood Culture (routine x 2)   Collection Time: 09/23/23  8:05 PM   Specimen: Left Antecubital; Blood  Result Value Ref Range   Specimen Description LEFT ANTECUBITAL    Special Requests      BOTTLES DRAWN AEROBIC AND ANAEROBIC Blood Culture adequate volume   Culture      NO GROWTH < 12 HOURS Performed at [facility], [address] [place], [place] [address]    Report Status PENDING   Lactic acid, plasma   Collection Time: 09/23/23  8:05 PM  Result Value Ref Range   Lactic Acid, Venous 1.8 0.5 - 1.9 mmol/L  Comprehensive metabolic panel   Collection Time: 09/23/23  8:05 PM  Result Value Ref Range   Sodium 129 (L) 135 - 145 mmol/L   Potassium 4.1 3.5 - 5.1 mmol/L   Chloride 96 (L) 98 - 111 mmol/L   CO2 19 (L) 22 - 32 mmol/L   Glucose, Bld 93 70 - 99 mg/dL   BUN 14 6 - 20 mg/dL   Creatinine, Ser 9.24 0.61 - 1.24 mg/dL   Calcium  8.7 (L) 8.9 - 10.3 mg/dL   Total Protein 7.6 6.5 - 8.1 g/dL   Albumin 2.8 (L) 3.5 - 5.0 g/dL   AST 757 (H) 15 - 41 U/L   ALT 66 (H) 0 - 44 U/L   Alkaline Phosphatase 195 (H) 38 - 126 U/L   Total Bilirubin 6.9 (H) 0.0 - 1.2 mg/dL   GFR, Estimated >39 >39 mL/min   Anion gap 14 5 - 15  CBC with Differential   Collection Time: 09/23/23  8:05 PM  Result Value Ref Range   WBC 7.5 4.0 - 10.5 K/uL   RBC 3.90 (L) 4.22 - 5.81 MIL/uL   Hemoglobin 14.3 13.0 - 17.0 g/dL   HCT 59.2 60.9 - 47.9 %   MCV 104.4 (H) 80.0 - 100.0 fL   MCH 36.7 (H) 26.0 - 34.0 pg   MCHC 35.1 30.0 - 36.0 g/dL   RDW 86.5 88.4 - 84.4 %   Platelets 128 (L) 150 - 400 K/uL   nRBC  0.0 0.0 - 0.2 %   Neutrophils Relative % 75 %   Neutro Abs 5.7 1.7 - 7.7 K/uL   Lymphocytes Relative 15 %   Lymphs Abs 1.1 0.7 - 4.0 K/uL   Monocytes Relative 6 %   Monocytes Absolute 0.5 0.1 - 1.0 K/uL   Eosinophils Relative 2 %   Eosinophils Absolute 0.1 0.0 - 0.5 K/uL   Basophils Relative 1 %   Basophils Absolute 0.1 0.0 - 0.1 K/uL   Immature Granulocytes 1 %   Abs Immature Granulocytes 0.07 0.00 - 0.07 K/uL  Protime-INR   Collection Time: 09/23/23  8:05 PM  Result Value Ref Range   Prothrombin Time 14.9 11.4 - 15.2 seconds   INR 1.1 0.8 - 1.2  Brain natriuretic peptide   Collection Time: 09/23/23  8:05 PM  Result Value Ref Range   B Natriuretic Peptide 74.0 0.0 -  100.0 pg/mL  Troponin I (High Sensitivity)   Collection Time: 09/23/23  8:05 PM  Result Value Ref Range   Troponin I (High Sensitivity) 20 (H) <18 ng/L  Blood Culture (routine x 2)   Collection Time: 09/23/23  8:20 PM   Specimen: BLOOD LEFT HAND  Result Value Ref Range   Specimen Description BLOOD LEFT HAND    Special Requests      BOTTLES DRAWN AEROBIC AND ANAEROBIC Blood Culture adequate volume   Culture      NO GROWTH < 12 HOURS Performed at [facility], [address] [place], [place] [address]    Report Status PENDING   Resp panel by RT-PCR (RSV, Flu A&B, Covid) Anterior Nasal Swab   Collection Time: 09/23/23  8:49 PM   Specimen: Anterior Nasal Swab  Result Value Ref Range   SARS Coronavirus 2 by RT PCR NEGATIVE NEGATIVE   Influenza A by PCR NEGATIVE NEGATIVE   Influenza B by PCR NEGATIVE NEGATIVE   Resp Syncytial Virus by PCR NEGATIVE NEGATIVE  Lactic acid, plasma   Collection Time: 09/23/23 10:00 PM  Result Value Ref Range   Lactic Acid, Venous 2.5 (HH) 0.5 - 1.9 mmol/L  Troponin I (High Sensitivity)   Collection Time: 09/23/23 10:00 PM  Result Value Ref Range   Troponin I (High Sensitivity) 16 <18 ng/L  HIV Antibody (routine testing w rflx)   Collection Time: 09/23/23 10:34 PM  Result  Value Ref Range   HIV Screen 4th Generation wRfx Non Reactive Non Reactive  Hepatitis panel, acute   Collection Time: 09/23/23 10:45 PM  Result Value Ref Range   Hepatitis B Surface Ag NON REACTIVE NON REACTIVE   HCV Ab NON REACTIVE NON REACTIVE   Hep A IgM NON REACTIVE NON REACTIVE   Hep B C IgM NON REACTIVE NON REACTIVE  Acetaminophen  level   Collection Time: 09/23/23 10:45 PM  Result Value Ref Range   Acetaminophen  (Tylenol ), Serum 10 10 - 30 ug/mL  APTT   Collection Time: 09/23/23 10:45 PM  Result Value Ref Range   aPTT 61 (H) 24 - 36 seconds  MRSA Next Gen by PCR, Nasal   Collection Time: 09/24/23  1:04 AM   Specimen: Nasal Mucosa; Nasal Swab  Result Value Ref Range   MRSA by PCR Next Gen NOT DETECTED NOT DETECTED  Lactic acid, plasma   Collection Time: 09/24/23  2:27 AM  Result Value Ref Range   Lactic Acid, Venous 2.0 (HH) 0.5 - 1.9 mmol/L  Procalcitonin   Collection Time: 09/24/23  2:27 AM  Result Value Ref Range   Procalcitonin 0.88 ng/mL  Magnesium    Collection Time: 09/24/23  2:27 AM  Result Value Ref Range   Magnesium  1.4 (L) 1.7 - 2.4 mg/dL  Phosphorus   Collection Time: 09/24/23  2:27 AM  Result Value Ref Range   Phosphorus 2.2 (L) 2.5 - 4.6 mg/dL  Comprehensive metabolic panel   Collection Time: 09/24/23  2:27 AM  Result Value Ref Range   Sodium 128 (L) 135 - 145 mmol/L   Potassium 3.9 3.5 - 5.1 mmol/L   Chloride 95 (L) 98 - 111 mmol/L   CO2 19 (L) 22 - 32 mmol/L   Glucose, Bld 126 (H) 70 - 99 mg/dL   BUN 13 6 - 20 mg/dL   Creatinine, Ser 9.20 0.61 - 1.24 mg/dL   Calcium  8.4 (L) 8.9 - 10.3 mg/dL   Total Protein 7.2 6.5 - 8.1 g/dL   Albumin 2.7 (L) 3.5 -  5.0 g/dL   AST 771 (H) 15 - 41 U/L   ALT 60 (H) 0 - 44 U/L   Alkaline Phosphatase 189 (H) 38 - 126 U/L   Total Bilirubin 6.6 (H) 0.0 - 1.2 mg/dL   GFR, Estimated >39 >39 mL/min   Anion gap 14 5 - 15  CBC with Differential/Platelet   Collection Time: 09/24/23  2:27 AM  Result Value Ref Range    WBC 6.6 4.0 - 10.5 K/uL   RBC 3.63 (L) 4.22 - 5.81 MIL/uL   Hemoglobin 13.1 13.0 - 17.0 g/dL   HCT 61.6 (L) 60.9 - 47.9 %   MCV 105.5 (H) 80.0 - 100.0 fL   MCH 36.1 (H) 26.0 - 34.0 pg   MCHC 34.2 30.0 - 36.0 g/dL   RDW 86.5 88.4 - 84.4 %   Platelets 118 (L) 150 - 400 K/uL   nRBC 0.0 0.0 - 0.2 %   Neutrophils Relative % 84 %   Neutro Abs 5.6 1.7 - 7.7 K/uL   Lymphocytes Relative 8 %   Lymphs Abs 0.5 (L) 0.7 - 4.0 K/uL   Monocytes Relative 4 %   Monocytes Absolute 0.3 0.1 - 1.0 K/uL   Eosinophils Relative 1 %   Eosinophils Absolute 0.0 0.0 - 0.5 K/uL   Basophils Relative 1 %   Basophils Absolute 0.1 0.0 - 0.1 K/uL   WBC Morphology MORPHOLOGY UNREMARKABLE    RBC Morphology MORPHOLOGY UNREMARKABLE    Smear Review PLATELETS APPEAR ADEQUATE    Immature Granulocytes 2 %   Abs Immature Granulocytes 0.10 (H) 0.00 - 0.07 K/uL  Urinalysis, w/ Reflex to Culture (Infection Suspected) -Urine, Clean Catch   Collection Time: 09/24/23  3:00 AM  Result Value Ref Range   Specimen Source URINE, CLEAN CATCH    Color, Urine AMBER (A) YELLOW   APPearance CLEAR CLEAR   Specific Gravity, Urine >1.046 (H) 1.005 - 1.030   pH 6.0 5.0 - 8.0   Glucose, UA NEGATIVE NEGATIVE mg/dL   Hgb urine dipstick NEGATIVE NEGATIVE   Bilirubin Urine SMALL (A) NEGATIVE   Ketones, ur NEGATIVE NEGATIVE mg/dL   Protein, ur NEGATIVE NEGATIVE mg/dL   Nitrite NEGATIVE NEGATIVE   Leukocytes,Ua NEGATIVE NEGATIVE   RBC / HPF 0-5 0 - 5 RBC/hpf   WBC, UA 0-5 0 - 5 WBC/hpf   Bacteria, UA NONE SEEN NONE SEEN   Squamous Epithelial / HPF 0-5 0 - 5 /HPF   Mucus PRESENT   Sodium, urine, random   Collection Time: 09/24/23  3:00 AM  Result Value Ref Range   Sodium, Ur 56 mmol/L  Lactic acid, plasma   Collection Time: 09/24/23  4:41 AM  Result Value Ref Range   Lactic Acid, Venous 1.6 0.5 - 1.9 mmol/L     C. Vicci, MD [facility]   09/23/2023  7:51 PM How to contact the [facility] Attending or Consulting provider 7A - 7P  or covering provider during after hours 7P -7A, for this patient?  Check the care team in [facility] and look for a) attending/consulting [facility] provider listed and b) the [facility] team listed Log into [email or web address] and use [place]'s universal password to access. If you do not have the password, please contact the hospital operator. Locate the [facility] provider you are looking for under [facility] and page to a number that you can be directly reached. If you still have difficulty reaching the provider, please page the [facility] (Director on Call) for the Hospitalists listed on amion for  assistance.

## 2023-09-24 NOTE — Progress Notes (Signed)
   09/24/23 1158  TOC Brief Assessment  Insurance and Status Reviewed  Patient has primary care physician Yes  Home environment has been reviewed Home w/ spouse  Prior level of function: Independent  Prior/Current Home Services No current home services  Social Drivers of Health Review SDOH reviewed no interventions necessary  Readmission risk has been reviewed Yes  Transition of care needs transition of care needs identified, TOC will continue to follow    Met with pt to discuss ETOH use and resources available. Pt agreeable to have resources placed on discharge instructions and is interested in 1 on 1 counseling. SA resources have been placed on AVS.   Pt currently requiring 4L O2 and is not on O2 at baseline. TOC will follow for need at DC.

## 2023-09-24 NOTE — Progress Notes (Signed)
   09/24/23 2143  BiPAP/CPAP/SIPAP  BiPAP/CPAP/SIPAP Pt Type Adult  BiPAP/CPAP/SIPAP DREAMSTATIOND  Mask Type Full face mask  Mask Size Large  EPAP  (auto titration between 11 to 13)  Flow Rate 5 lpm  Patient Home Machine No  Patient Home Mask No  Patient Home Tubing No  Auto Titrate Yes  Minimum cmH2O 11 cmH2O  Maximum cmH2O 13 cmH2O  BiPAP/CPAP /SiPAP Vitals  Pulse Rate 93  Resp 18  SpO2 92 %  Bilateral Breath Sounds Clear;Diminished  MEWS Score/Color  MEWS Score 0  MEWS Score Color Ryan Holland

## 2023-09-24 NOTE — Progress Notes (Signed)
 Sputum collection cup placed near bedside for patient, instructions given to patient.

## 2023-09-24 NOTE — Consult Note (Addendum)
 Gastroenterology Consult   Referring Provider: No ref. provider found Primary Care Physician:  [name] RIGGERS Primary Gastroenterologist:  not established   Patient ID: Ryan Holland; [id number]; [birth year]   Admit date: 09/23/2023  LOS: 1 day   Date of Consultation: 09/24/2023  Reason for Consultation:  Elevated LFTs   History of Present Illness   Ryan Holland is a 53 y.o. year old male with history of anxiety, GERD, HTN, sleep apnea who presented to the ED with increased SOB, cough, congestion x4 days. Currently being followed by pulmonology. Found to have elevated LFTs on admission which he has also been being worked up for as an outpatient as well. GI consulted for further evaluation  ED Course: -Hypoxic (87%) requring 4L O2, tachypneic with RR 33, T max 100.5, HR 103 -CT angio chest without PE, however showed interval development of diffuse, lower lung zone predominant, peribronchovascular patchy airspace opacities. Findings may represent infection/inflammation versus alveolar hemorrhage.  -CT abdomen and pelvis with contrast which revealed mediastinal lymphadenopathy, likely reactive in etiology. Splenomegaly.  -INR 1.1, AST 242, ALT 66, ALk pohos 195, T bili 6.9, plt 128k -Trop 20>16 -HIV, Acute Hep panel, tylenol  level pending  Consult: Patient reports elevated liver enzymes for some time, though PCP was referring him to GI/hepatology but has not gotten referral. He drinks about 2-3 L of vodka per week and has done so for decades. Reports he has gone periods of time where he quit and thinks mentally he has the ability to do so. Denies any illicit drug use.  Taking l carniteine, vitaliver to help 'detox his liver also taking mullein drops to help with his lungs. Denies recent antibiotic exposure   He denies ascites, swelling to his legs, episodes of confusion.   No red flag symptoms. Patient denies melena, hematochezia, nausea, vomiting, diarrhea, constipation,  dysphagia, odyonophagia, early satiety or weight loss.   Past Medical History:  Diagnosis Date   Anxiety    GERD (gastroesophageal reflux disease)    OCCAS-WOULD USE OTC MED IF NEEDED   HNP (herniated nucleus pulposus), lumbar    PAIN IN LOWER BACK AND PAIN DOWN BOTH LEGS -12-24-14 radiates down right leg presently.   Hypertension    Sleep apnea    HAS CPAP-DOES NOT USE-DOES NOT KNOW SETTINGS    Past Surgical History:  Procedure Laterality Date   01/05/2011 LUMBAR DECOMPRESSION AND MICRODISCECTOMY     CERVICAL FUSION  C4-5      ESOPHAGOGASTRODUODENOSCOPY (EGD) WITH PROPOFOL  N/A 12/30/2014   Procedure: ESOPHAGOGASTRODUODENOSCOPY (EGD) WITH PROPOFOL ;  Surgeon: Elsie Cree, MD;  Location: [facility] ENDOSCOPY;  Service: Endoscopy;  Laterality: N/A;  With Bravo   LUMBAR LAMINECTOMY/DECOMPRESSION MICRODISCECTOMY  07/26/2011   Procedure: LUMBAR LAMINECTOMY/DECOMPRESSION MICRODISCECTOMY;  Surgeon: [name], MD;  Location: [facility] ORS;  Service: Orthopedics;  Laterality: Right;  Re-Do Lumbar Decompression L5-S1 on Right,    PILONIDAL CYST EXCISION      Prior to Admission medications   Medication Sig Start Date End Date Taking? Authorizing Provider  albuterol  (PROAIR  HFA) 108 (90 Base) MCG/ACT inhaler Inhale 2 puffs into the lungs every 4 (four) hours as needed for wheezing or shortness of breath. 11/22/22   Geronimo Amel, MD  benzonatate  (TESSALON ) 100 MG capsule Take 1 capsule (100 mg total) by mouth 3 (three) times daily as needed for cough. 08/17/23   Saguier, Dallas, [name]  citalopram  (CELEXA ) 40 MG tablet Take 1 tablet (40 mg total) by mouth daily. 08/16/23   Pavero, Lonni, RPH  diphenhydrAMINE (  BENADRYL) 25 mg capsule Take 50 mg by mouth in the morning.    [provider]  Dupilumab  (DUPIXENT ) 300 MG/2ML SOAJ Inject 300 mg into the skin every 14 (fourteen) days. 09/20/23   Jegede, Olugbemiga E, MD  Evolocumab  (REPATHA  SURECLICK) 140 MG/ML SOAJ Inject 140 mg into the skin every  14 (fourteen) days. 09/21/23   Kate Lonni CROME, MD  fluticasone  (FLONASE ) 50 MCG/ACT nasal spray Place 2 sprays into both nostrils daily. 09/27/22   Saguier, Dallas, [name]  fluticasone  furoate-vilanterol (BREO ELLIPTA ) 200-25 MCG/ACT AEPB Inhale 1 puff into the lungs daily. 06/19/23   Geronimo Amel, MD  losartan  (COZAAR ) 100 MG tablet Take 1 tablet (100 mg total) by mouth daily. 08/16/23   Pavero, Lonni, RPH  methocarbamol  (ROBAXIN ) 500 MG tablet Take 1 tablet by mouth every  8 hours as needed for muscle spams. 08/17/23   Saguier, Dallas, [name]  montelukast  (SINGULAIR ) 10 MG tablet Take 1 tablet (10 mg total) by mouth daily. 04/09/23   Saguier, Dallas, [name]  pantoprazole  (PROTONIX ) 40 MG tablet Take 1 tablet (40 mg total) by mouth daily. 04/09/23   Saguier, Dallas, [name]  Semaglutide -Weight Management (WEGOVY ) 0.25 MG/0.5ML SOAJ Inject 0.25 mg into the skin once a week. 08/14/23   Saguier, Dallas, [name]    Current Facility-Administered Medications  Medication Dose Route Frequency Provider Last Rate Last Admin   artificial tears ophthalmic solution 1 drop  1 drop Both Eyes PRN Daralene Lonni BIRCH, [name]       azithromycin  (ZITHROMAX ) 500 mg in sodium chloride  0.9 % 250 mL IVPB  500 mg Intravenous Q24H Daralene Lonni BIRCH, [name]   Stopped at 09/23/23 2123   cefTRIAXone  (ROCEPHIN ) 2 g in sodium chloride  0.9 % 100 mL IVPB  2 g Intravenous Q24H Shona Laurence N, DO 200 mL/hr at 09/24/23 0807 2 g at 09/24/23 9192   Chlorhexidine  Gluconate Cloth 2 % PADS 6 each  6 each Topical Daily Shona Laurence N, DO       citalopram  (CELEXA ) tablet 40 mg  40 mg Oral Daily Shona Laurence N, DO   40 mg at 09/24/23 0231   fluticasone  furoate-vilanterol (BREO ELLIPTA ) 200-25 MCG/ACT 1 puff  1 puff Inhalation Daily Shona Laurence N, DO   1 puff at 09/24/23 9161   folic acid  (FOLVITE ) tablet 1 mg  1 mg Oral Daily Shona Laurence N, DO   1 mg at 09/24/23 9191   guaiFENesin -dextromethorphan  (ROBITUSSIN DM) 100-10 MG/5ML syrup 5  mL  5 mL Oral Q4H PRN Hall, Carole N, DO   5 mL at 09/24/23 0231   ipratropium-albuterol  (DUONEB) 0.5-2.5 (3) MG/3ML nebulizer solution 3 mL  3 mL Nebulization Q4H Shona Laurence N, DO   3 mL at 09/24/23 9165   lactated ringers  infusion   Intravenous Continuous Shona Laurence N, DO 10 mL/hr at 09/24/23 0252 Rate Change at 09/24/23 0252   LORazepam  (ATIVAN ) tablet 1-4 mg  1-4 mg Oral Q1H PRN Shona Laurence SAILOR, DO       Or   LORazepam  (ATIVAN ) injection 1-4 mg  1-4 mg Intravenous Q1H PRN Shona Laurence N, DO       magnesium  sulfate IVPB 4 g 100 mL  4 g Intravenous Once Vicci, Clanford L, MD 50 mL/hr at 09/24/23 0907 4 g at 09/24/23 0907   melatonin tablet 6 mg  6 mg Oral QHS PRN Shona Laurence N, DO       methylPREDNISolone  sodium succinate (SOLU-MEDROL ) 40 mg/mL injection 40 mg  40 mg Intravenous  Daily Shona Terry SAILOR, DO   40 mg at 09/24/23 9191   montelukast  (SINGULAIR ) tablet 10 mg  10 mg Oral QHS Shona Terry N, DO   10 mg at 09/24/23 0231   multivitamin with minerals tablet 1 tablet  1 tablet Oral Daily Shona Terry SAILOR, DO   1 tablet at 09/24/23 9191   pantoprazole  (PROTONIX ) EC tablet 40 mg  40 mg Oral Daily Shona Terry SAILOR, DO   40 mg at 09/24/23 9191   polyethylene glycol (MIRALAX  / GLYCOLAX ) packet 17 g  17 g Oral Daily PRN Shona Terry SAILOR, DO       prochlorperazine  (COMPAZINE ) injection 5 mg  5 mg Intravenous Q6H PRN Shona Terry N, DO       thiamine  (VITAMIN B1) tablet 100 mg  100 mg Oral Daily Shona Terry N, DO   100 mg at 09/24/23 9191   Or   thiamine  (VITAMIN B1) injection 100 mg  100 mg Intravenous Daily Shona Terry N, DO        Allergies as of 09/23/2023 - Review Complete 09/23/2023  Allergen Reaction Noted   Other  07/17/2011    Family History  Problem Relation Age of Onset   COPD Father    Congestive Heart Failure Father    Breast cancer Mother    Review of Systems   Gen: Denies any fever, chills, loss of appetite, change in weight or weight loss CV: Denies chest pain, heart  palpitations, syncope, edema  Resp: Denies shortness of breath with rest, cough, wheezing, coughing up blood, and pleurisy. GI: denies melena, hematochezia, nausea, vomiting, diarrhea, constipation, dysphagia, odyonophagia, early satiety or weight loss.  GU : Denies urinary burning, blood in urine, urinary frequency, and urinary incontinence. MS: Denies joint pain, limitation of movement, swelling, cramps, and atrophy.  Derm: Denies rash, itching, dry skin, hives. Psych: Denies depression, anxiety, memory loss, hallucinations, and confusion. Heme: Denies bruising or bleeding Neuro:  Denies any headaches, dizziness, paresthesias, shaking  Physical Exam   Vital Signs in last 24 hours: Temp:  [97.2 F (36.2 C)-100.5 F (38.1 C)] 97.2 F (36.2 C) (07/21 0719) Pulse Rate:  [71-119] 71 (07/21 0719) Resp:  [20-40] 21 (07/21 0700) BP: (106-143)/(52-83) 122/82 (07/21 0700) SpO2:  [87 %-100 %] 100 % (07/21 0839) Weight:  [127.7 kg-127.9 kg] 127.7 kg (07/21 0121) Last BM Date : 09/23/23  General:   Alert,  Well-developed, well-nourished, pleasant and cooperative in NAD Head:  Normocephalic and atraumatic. Eyes: scleral icterus  Ears:  Normal auditory acuity. Mouth:  No deformity or lesions, dentition normal. Neck:  Supple; no masses Lungs:  Clear throughout to auscultation.   No wheezes, crackles, or rhonchi. No acute distress. Heart:  Regular rate and rhythm; no murmurs, clicks, rubs,  or gallops. Abdomen:  Soft, nontender and nondistended. No masses, hepatosplenomegaly or hernias noted. Normal bowel sounds, without guarding, and without rebound.   Msk:  Symmetrical without gross deformities. Normal posture. Extremities:  Without clubbing or edema. Neurologic:  Alert and  oriented x4. Skin:  jaundiced  Psych:  Alert and cooperative. Normal mood and affect.  Labs/Studies   Recent Labs Recent Labs    09/23/23 2005 09/24/23 0227  WBC 7.5 6.6  HGB 14.3 13.1  HCT 40.7 38.3*  PLT  128* 118*   BMET Recent Labs    09/23/23 2005 09/24/23 0227  NA 129* 128*  K 4.1 3.9  CL 96* 95*  CO2 19* 19*  GLUCOSE 93 126*  BUN 14 13  CREATININE 0.75 0.79  CALCIUM  8.7* 8.4*   LFT Recent Labs    09/23/23 2005 09/24/23 0227  PROT 7.6 7.2  ALBUMIN 2.8* 2.7*  AST 242* 228*  ALT 66* 60*  ALKPHOS 195* 189*  BILITOT 6.9* 6.6*   PT/INR Recent Labs    09/23/23 2005  LABPROT 14.9  INR 1.1    Assessment  Ryan Holland is a 53 y.o. year old male with history of anxiety, GERD, HTN, sleep apnea who presented to the ED with increased SOB, cough, congestion x4 days.Found to have elevated LFTs on admission. GI consulted for further evaluation  Elevated LFTs: -on admission AST 242, ALT 66, Alk pho 189, T bili 6.9, trending down slightly with AST 228, ALT 60, ALk phos 189, T bili 6.6 today -INR Normal at 1.1, PT 14.9 -plt count 128k<118k  -RUQ US  done 6/27as outpatient with fatty infiltration of liver, no shadowing stones or ductal dilatation -admits to frequent ETOH--2-3L of vodka per week for many years -Acute Hep panel, HIV pending -acetaminophen  level WNL  -Reassuringly LFTs appear to be trending down slightly -LFTs have been elevated as outpatient, since atleast 2019   - Recent US  at the end of June was without concerning findings. Fatty liver. -suspect we are likely dealing with alcoholic hepatitis here,  his DF Is 15.3, therefore no steroids indicated at this time -if the above workup is negative, would recommend further testing to rule out superimposed autoimmune etiologies, iron overload, genetic disorders   Thorough discussion held with the patient regarding need for alcohol  cessation as well the consequences of ongoing drinking. We discussed progression of possible liver disease to cirrhosis which is irreversible. He states he feels he can stop drinking on his own. We did discuss likely withdrawal symptoms given the amount he drinks and that this may need medical  management as it can vary by person. Patient verbalized understanding and all questions were answered.    Plan / Recommendations   -follow for pending acute hep panel, HIV -trend LFTs, PT/INR daily  -may consider further testing with autoimmune serologies, iron studies if the above workup negative -avoid hepatotoxic medications -needs complete ETOH cessation -RUQ US  with liver doppler  -CIWA protocol per hospitalist    09/24/2023, 9:13 AM  Gardy Montanari L. Lynnex Fulp, MSN, APRN, AGNP-C Adult-Gerontology Nurse Practitioner [facility]

## 2023-09-24 NOTE — Plan of Care (Signed)
   Problem: Education: Goal: Knowledge of General Education information will improve Description Including pain rating scale, medication(s)/side effects and non-pharmacologic comfort measures Outcome: Progressing   Problem: Education: Goal: Knowledge of General Education information will improve Description Including pain rating scale, medication(s)/side effects and non-pharmacologic comfort measures Outcome: Progressing

## 2023-09-24 NOTE — H&P (Addendum)
 History and Physical  Ryan Holland FMW:[id number] DOB: 10-31-1970 DOA: 09/23/2023  Referring physician: Dr. Theadore, EDP  PCP: [name] RIGGERS  Outpatient Specialists: Pulmonary Patient coming from: Home  Chief Complaint: Shortness of breath.  HPI: Ryan Holland is a 53 y.o. male with medical history significant for OSA on CPAP, obesity, asthma, alcohol  use disorder, who presents to the ER due to shortness of breath for the past 4 days.  Associated with a persistent cough.  Endorses subjective fevers and intermittent chills.  In the ER, febrile, hypoxic requiring 4 L nasal cannula to maintain O2 saturation above 92%, tachycardic, tachypneic.  CT angio chest was negative for acute pulmonary embolism however showed interval development of diffuse, lower lung zone predominant, peribronchovascular patchy airspace opacities.  Findings may represent infection/inflammation versus alveolar hemorrhage.  Additionally, the patient had a CT abdomen and pelvis with contrast which revealed mediastinal lymphadenopathy, likely reactive in etiology.  Splenomegaly.  Lab work was notable for elevated liver chemistries and T. bili of 6.9.  Admits to excessive alcohol  use.  Due to concern for possible multifocal CAP, the patient was started on Rocephin  and IV azithromycin .  He also received IV Solu-Medrol , and bronchodilator nebulizers in the ER.  Admitted by [facility], hospitalist service.  ED Course: Temperature 100.5.  BP 142/84, pulse 103, respiratory rate 33, O2 saturation 94% on 4 L.  Review of Systems: Review of systems as noted in the HPI. All other systems reviewed and are negative.   Past Medical History:  Diagnosis Date   Anxiety    GERD (gastroesophageal reflux disease)    OCCAS-WOULD USE OTC MED IF NEEDED   HNP (herniated nucleus pulposus), lumbar    PAIN IN LOWER BACK AND PAIN DOWN BOTH LEGS -12-24-14 radiates down right leg presently.   Hypertension    Sleep apnea    HAS CPAP-DOES NOT  USE-DOES NOT KNOW SETTINGS   Past Surgical History:  Procedure Laterality Date   01/05/2011 LUMBAR DECOMPRESSION AND MICRODISCECTOMY     CERVICAL FUSION  C4-5      ESOPHAGOGASTRODUODENOSCOPY (EGD) WITH PROPOFOL  N/A 12/30/2014   Procedure: ESOPHAGOGASTRODUODENOSCOPY (EGD) WITH PROPOFOL ;  Surgeon: Elsie Cree, MD;  Location: [facility];  Service: Endoscopy;  Laterality: N/A;  With Bravo   LUMBAR LAMINECTOMY/DECOMPRESSION MICRODISCECTOMY  07/26/2011   Procedure: LUMBAR LAMINECTOMY/DECOMPRESSION MICRODISCECTOMY;  Surgeon: [name], MD;  Location: [facility];  Service: Orthopedics;  Laterality: Right;  Re-Do Lumbar Decompression L5-S1 on Right,    PILONIDAL CYST EXCISION      Social History:  reports that he quit smoking about 11 years ago. His smoking use included cigarettes. He started smoking about 31 years ago. He has a 40 pack-year smoking history. He has quit using smokeless tobacco.  His smokeless tobacco use included chew. He reports current alcohol  use. He reports that he does not use drugs.   Allergies  Allergen Reactions   Other     LEMON-sores in mouth    Family History  Problem Relation Age of Onset   COPD Father    Congestive Heart Failure Father    Breast cancer Mother       Prior to Admission medications   Medication Sig Start Date End Date Taking? Authorizing Provider  albuterol  (PROAIR  HFA) 108 (90 Base) MCG/ACT inhaler Inhale 2 puffs into the lungs every 4 (four) hours as needed for wheezing or shortness of breath. 11/22/22   Geronimo Amel, MD  benzonatate  (TESSALON ) 100 MG capsule Take 1 capsule (100 mg total) by mouth 3 (  three) times daily as needed for cough. 08/17/23   Saguier, [place], [name]  citalopram  (CELEXA ) 40 MG tablet Take 1 tablet (40 mg total) by mouth daily. 08/16/23   Pavero, Lonni, RPH  diphenhydrAMINE (BENADRYL) 25 mg capsule Take 50 mg by mouth in the morning.    [provider]  Dupilumab  (DUPIXENT ) 300 MG/2ML SOAJ Inject 300  mg into the skin every 14 (fourteen) days. 09/20/23   Jegede, Olugbemiga E, MD  Evolocumab  (REPATHA  SURECLICK) 140 MG/ML SOAJ Inject 140 mg into the skin every 14 (fourteen) days. 09/21/23   Kate Lonni CROME, MD  fluticasone  (FLONASE ) 50 MCG/ACT nasal spray Place 2 sprays into both nostrils daily. 09/27/22   Saguier, [place], [name]  fluticasone  furoate-vilanterol (BREO ELLIPTA ) 200-25 MCG/ACT AEPB Inhale 1 puff into the lungs daily. 06/19/23   Geronimo Amel, MD  losartan  (COZAAR ) 100 MG tablet Take 1 tablet (100 mg total) by mouth daily. 08/16/23   Pavero, Lonni, RPH  methocarbamol  (ROBAXIN ) 500 MG tablet Take 1 tablet by mouth every  8 hours as needed for muscle spams. 08/17/23   Saguier, [place], [name]  montelukast  (SINGULAIR ) 10 MG tablet Take 1 tablet (10 mg total) by mouth daily. 04/09/23   Saguier, [place], [name]  pantoprazole  (PROTONIX ) 40 MG tablet Take 1 tablet (40 mg total) by mouth daily. 04/09/23   Saguier, [place], [name]  Semaglutide -Weight Management (WEGOVY ) 0.25 MG/0.5ML SOAJ Inject 0.25 mg into the skin once a week. 08/14/23   Saguier, [place], [name]    Physical Exam: BP (!) 143/83   Pulse (!) 103   Temp (!) 100.5 F (38.1 C) (Axillary)   Resp (!) 26   Ht 5' 10 (1.778 m)   Wt 127.7 kg   SpO2 95%   BMI 40.39 kg/m   General: 53 y.o. year-old male well developed well nourished in no acute distress.  Alert and oriented x3. Cardiovascular: Tachycardic with no rubs or gallops.  No thyromegaly or JVD noted.  No lower extremity edema. 2/4 pulses in all 4 extremities. Respiratory: Diffuse faint rales bilaterally.  4 inspiratory effort. Abdomen: Obese and mildly distended with normal bowel sounds x4 quadrants. Muskuloskeletal: No cyanosis, clubbing or edema noted bilaterally Neuro: CN II-XII intact, strength, sensation, reflexes Skin: No ulcerative lesions noted or rashes Psychiatry: Judgement and insight appear normal. Mood is appropriate for condition and setting          Labs  on Admission:  Basic Metabolic Panel: Recent Labs  Lab 09/23/23 2005  NA 129*  K 4.1  CL 96*  CO2 19*  GLUCOSE 93  BUN 14  CREATININE 0.75  CALCIUM  8.7*   Liver Function Tests: Recent Labs  Lab 09/23/23 2005  AST 242*  ALT 66*  ALKPHOS 195*  BILITOT 6.9*  PROT 7.6  ALBUMIN 2.8*   No results for input(s): LIPASE, AMYLASE in the last 168 hours. No results for input(s): AMMONIA in the last 168 hours. CBC: Recent Labs  Lab 09/23/23 2005  WBC 7.5  NEUTROABS 5.7  HGB 14.3  HCT 40.7  MCV 104.4*  PLT 128*   Cardiac Enzymes: No results for input(s): CKTOTAL, CKMB, CKMBINDEX, TROPONINI in the last 168 hours.  BNP (last 3 results) Recent Labs    11/02/22 0223 09/23/23 2005  BNP 32.1 74.0    ProBNP (last 3 results) Recent Labs    11/10/22 0921  PROBNP 17.0    CBG: No results for input(s): GLUCAP in the last 168 hours.  Radiological Exams on Admission: CT Angio Chest PE  W/Cm &/Or Wo Cm Result Date: 09/23/2023 CLINICAL DATA:  Pulmonary embolism (PE) suspected, high prob; Epigastric pain c/o increased sob and not feeling good since Friday. When ems arrived pt o2 sat was in 80s. They placed pt on NRB @12lpm  Epigastric pain EXAM: CT ANGIOGRAPHY CHEST CT ABDOMEN AND PELVIS WITH CONTRAST TECHNIQUE: Multidetector CT imaging of the chest was performed using the standard protocol during bolus administration of intravenous contrast. Multiplanar CT image reconstructions and MIPs were obtained to evaluate the vascular anatomy. Multidetector CT imaging of the abdomen and pelvis was performed using the standard protocol during bolus administration of intravenous contrast. RADIATION DOSE REDUCTION: This exam was performed according to the departmental dose-optimization program which includes automated exposure control, adjustment of the mA and/or kV according to patient size and/or use of iterative reconstruction technique. CONTRAST:  75mL OMNIPAQUE  IOHEXOL  350 MG/ML  SOLN COMPARISON:  Chest x-ray 09/23/2023, CT chest 12/12/2022 FINDINGS: CTA CHEST FINDINGS Cardiovascular: Satisfactory opacification of the pulmonary arteries to the segmental level. No evidence of pulmonary embolism. Normal heart size. No significant pericardial effusion. The thoracic aorta is normal in caliber. No atherosclerotic plaque of the thoracic aorta. No coronary artery calcifications. Mediastinum/Nodes: Several prominent nonenlarged mediastinal lymph nodes with as an example a 1.1 cm precarinal lymph node (2:41). No hilar or axillary lymph nodes. Thyroid  gland, trachea, and esophagus demonstrate no significant findings. Lungs/Pleura: Interval development of diffuse, lower lung zone predominant, peribronchovascular patchy airspace opacities. No pulmonary nodule. No pulmonary mass. No pleural effusion. No pneumothorax. Musculoskeletal: No chest wall abnormality. No suspicious lytic or blastic osseous lesions. No acute displaced fracture. Multilevel degenerative changes of the spine. Review of the MIP images confirms the above findings. CT ABDOMEN and PELVIS FINDINGS Hepatobiliary: No focal liver abnormality. No gallstones, gallbladder wall thickening, or pericholecystic fluid. No biliary dilatation. Pancreas: Diffusely atrophic. No focal lesion. Otherwise normal pancreatic contour. No surrounding inflammatory changes. No main pancreatic ductal dilatation. Spleen: Spleen enlarged measuring up to 17 cm. No focal splenic lesion. Adrenals/Urinary Tract: No adrenal nodule bilaterally. Bilateral kidneys enhance symmetrically. No hydronephrosis. No hydroureter. The urinary bladder is unremarkable. On delayed imaging, there is no urothelial wall thickening and there are no filling defects in the opacified portions of the bilateral collecting systems or ureters. Stomach/Bowel: Stomach is within normal limits. No evidence of bowel wall thickening or dilatation. Appendix appears normal. Vascular/Lymphatic: No  abdominal aorta or iliac aneurysm. Mild atherosclerotic plaque of the aorta and its branches. No abdominal, pelvic, or inguinal lymphadenopathy. Reproductive: Prostate is unremarkable. Other: No intraperitoneal free fluid. No intraperitoneal free gas. No organized fluid collection. Musculoskeletal: No abdominal wall hernia or abnormality. No suspicious lytic or blastic osseous lesions. No acute displaced fracture. Multilevel degenerative changes of the spine. Review of the MIP images confirms the above findings. IMPRESSION: 1. Interval development of diffuse, lower lung zone predominant, peribronchovascular patchy airspace opacities. Findings may represente infection/inflammation (COVID-19 not excluded) versus alveolar hemorrhage. 2. Mediastinal lymphadenopathy. Finding likely reactive in etiology. Recommend attention on follow-up. 3. Splenomegaly. 4.  Aortic Atherosclerosis (ICD10-I70.0). Electronically Signed   By: Morgane  Naveau M.D.   On: 09/23/2023 21:26   CT ABDOMEN PELVIS W CONTRAST Result Date: 09/23/2023 CLINICAL DATA:  Pulmonary embolism (PE) suspected, high prob; Epigastric pain c/o increased sob and not feeling good since Friday. When ems arrived pt o2 sat was in 80s. They placed pt on NRB @12lpm  Epigastric pain EXAM: CT ANGIOGRAPHY CHEST CT ABDOMEN AND PELVIS WITH CONTRAST TECHNIQUE: Multidetector CT imaging of the chest was performed  using the standard protocol during bolus administration of intravenous contrast. Multiplanar CT image reconstructions and MIPs were obtained to evaluate the vascular anatomy. Multidetector CT imaging of the abdomen and pelvis was performed using the standard protocol during bolus administration of intravenous contrast. RADIATION DOSE REDUCTION: This exam was performed according to the departmental dose-optimization program which includes automated exposure control, adjustment of the mA and/or kV according to patient size and/or use of iterative reconstruction  technique. CONTRAST:  75mL OMNIPAQUE  IOHEXOL  350 MG/ML SOLN COMPARISON:  Chest x-ray 09/23/2023, CT chest 12/12/2022 FINDINGS: CTA CHEST FINDINGS Cardiovascular: Satisfactory opacification of the pulmonary arteries to the segmental level. No evidence of pulmonary embolism. Normal heart size. No significant pericardial effusion. The thoracic aorta is normal in caliber. No atherosclerotic plaque of the thoracic aorta. No coronary artery calcifications. Mediastinum/Nodes: Several prominent nonenlarged mediastinal lymph nodes with as an example a 1.1 cm precarinal lymph node (2:41). No hilar or axillary lymph nodes. Thyroid  gland, trachea, and esophagus demonstrate no significant findings. Lungs/Pleura: Interval development of diffuse, lower lung zone predominant, peribronchovascular patchy airspace opacities. No pulmonary nodule. No pulmonary mass. No pleural effusion. No pneumothorax. Musculoskeletal: No chest wall abnormality. No suspicious lytic or blastic osseous lesions. No acute displaced fracture. Multilevel degenerative changes of the spine. Review of the MIP images confirms the above findings. CT ABDOMEN and PELVIS FINDINGS Hepatobiliary: No focal liver abnormality. No gallstones, gallbladder wall thickening, or pericholecystic fluid. No biliary dilatation. Pancreas: Diffusely atrophic. No focal lesion. Otherwise normal pancreatic contour. No surrounding inflammatory changes. No main pancreatic ductal dilatation. Spleen: Spleen enlarged measuring up to 17 cm. No focal splenic lesion. Adrenals/Urinary Tract: No adrenal nodule bilaterally. Bilateral kidneys enhance symmetrically. No hydronephrosis. No hydroureter. The urinary bladder is unremarkable. On delayed imaging, there is no urothelial wall thickening and there are no filling defects in the opacified portions of the bilateral collecting systems or ureters. Stomach/Bowel: Stomach is within normal limits. No evidence of bowel wall thickening or dilatation.  Appendix appears normal. Vascular/Lymphatic: No abdominal aorta or iliac aneurysm. Mild atherosclerotic plaque of the aorta and its branches. No abdominal, pelvic, or inguinal lymphadenopathy. Reproductive: Prostate is unremarkable. Other: No intraperitoneal free fluid. No intraperitoneal free gas. No organized fluid collection. Musculoskeletal: No abdominal wall hernia or abnormality. No suspicious lytic or blastic osseous lesions. No acute displaced fracture. Multilevel degenerative changes of the spine. Review of the MIP images confirms the above findings. IMPRESSION: 1. Interval development of diffuse, lower lung zone predominant, peribronchovascular patchy airspace opacities. Findings may represente infection/inflammation (COVID-19 not excluded) versus alveolar hemorrhage. 2. Mediastinal lymphadenopathy. Finding likely reactive in etiology. Recommend attention on follow-up. 3. Splenomegaly. 4.  Aortic Atherosclerosis (ICD10-I70.0). Electronically Signed   By: Morgane  Naveau M.D.   On: 09/23/2023 21:26   DG Chest Port 1 View Result Date: 09/23/2023 EXAM: 1 VIEW XRAY OF THE CHEST 09/23/2023 08:23:00 PM COMPARISON: CT chest dated 09/21/2023. CLINICAL HISTORY: Questionable sepsis - evaluate for abnormality. Per triage SOB and not feeling good since Friday. When EMS arrived pt O2 sat was in 80s. FINDINGS: LUNGS AND PLEURA: Mild patchy interstitial/airspace opacities, lower lungs predominant, favoring interstitial/alveolar edema over pneumonia given rapid change. No pneumothorax. HEART AND MEDIASTINUM: Cardiomegaly. BONES AND SOFT TISSUES: No acute osseous abnormality. IMPRESSION: 1. Mild patchy interstitial/airspace opacities, favoring interstitial/alveolar edema over pneumonia. 2. Cardiomegaly. Electronically signed by: Pinkie Pebbles MD 09/23/2023 08:26 PM EDT RP Workstation: [id number]    EKG: I independently viewed the EKG done and my findings are as followed: Sinus tachycardia rate  of 105.  Nonspecific  ST changes.  QTc 468.  Assessment/Plan Present on Admission:  Acute hypoxic respiratory failure (HCC)  Principal Problem:   Acute hypoxic respiratory failure (HCC)  Acute hypoxic respiratory failure secondary to multifocal pneumonia versus others Denies use of vape Personally reviewed CT chest which showed bilateral lower lobe infiltrates Currently requiring 4 L to maintain O2 saturation above 92% Not on oxygen supplementation at baseline Continue empiric IV antibiotics for CAP Continue IV Solu-Medrol  Continue bronchodilator nebulizers Incentive spirometer Early ambulation Wean off O2 supplementation as tolerated  Acute asthma exacerbation in the setting of multifocal pneumonia Resume home montelukast  and bronchodilators Continue Rocephin  and IV azithromycin  Continue IV Solu-Medrol  Continue bronchodilators nebulizers As needed antitussives  Alcohol  abuse with concern for alcohol  withdrawal CIWA protocol Multivitamins, thiamine , folic acid  supplement Interested in alcohol  cessation  Elevated liver chemistries in the setting of alcohol  abuse Rule out other causes, follow acute hepatitis panel Avoid hepatotoxic agents. Repeat CMP in the morning  Hyperbilirubinemia, jaundice CT abdomen pelvis with contrast was unrevealing, no biliary dilatation, no gallstone no focal liver abnormality  Splenomegaly Spleen measuring up to 17 cm.  No focal splenic lesion  Thrombocytopenia, mild Platelet count 128 in the setting of alcohol  abuse Monitor for now  Non anion gap metabolic acidosis Serum bicarb 19, anion gap 14 Received LR in the ER.  Elevated troponin, suspect demand ischemia in the setting of hypoxia High-sensitivity troponin peaked at 20 and down trended. No evidence of acute ischemia on twelve-lead EKG Monitor with telemetry  Obesity BMI 40 Recommend weight loss outpatient regular physical activity and healthy diet improved  OSA CPAP nightly   Critical care  time: 65 minutes.   DVT prophylaxis: SCDs.  Code Status: Full code.  Family Communication: None at bedside.  Disposition Plan: Admitted to stepdown unit.  Consults called: None.  Admission status: Inpatient status.   Status is: Inpatient The patient requires at least 2 midnights for further evaluation and treatment of present condition.   Terry LOISE Hurst MD [facility] Pager [phone]  If 7PM-7AM, please contact night-coverage [email or web address] Password [email or web address]  09/24/2023, 1:57 AM

## 2023-09-24 NOTE — Plan of Care (Signed)
   Problem: Coping: Goal: Level of anxiety will decrease Outcome: Progressing

## 2023-09-25 ENCOUNTER — Telehealth: Payer: Self-pay | Admitting: Gastroenterology

## 2023-09-25 DIAGNOSIS — K760 Fatty (change of) liver, not elsewhere classified: Secondary | ICD-10-CM | POA: Diagnosis not present

## 2023-09-25 DIAGNOSIS — F1090 Alcohol use, unspecified, uncomplicated: Secondary | ICD-10-CM | POA: Diagnosis not present

## 2023-09-25 DIAGNOSIS — F1010 Alcohol abuse, uncomplicated: Secondary | ICD-10-CM | POA: Diagnosis not present

## 2023-09-25 DIAGNOSIS — E871 Hypo-osmolality and hyponatremia: Secondary | ICD-10-CM | POA: Diagnosis not present

## 2023-09-25 DIAGNOSIS — K7010 Alcoholic hepatitis without ascites: Secondary | ICD-10-CM

## 2023-09-25 DIAGNOSIS — J9601 Acute respiratory failure with hypoxia: Secondary | ICD-10-CM | POA: Diagnosis not present

## 2023-09-25 DIAGNOSIS — R748 Abnormal levels of other serum enzymes: Secondary | ICD-10-CM

## 2023-09-25 DIAGNOSIS — R7989 Other specified abnormal findings of blood chemistry: Secondary | ICD-10-CM

## 2023-09-25 LAB — COMPREHENSIVE METABOLIC PANEL WITH GFR
ALT: 70 U/L — ABNORMAL HIGH (ref 0–44)
AST: 182 U/L — ABNORMAL HIGH (ref 15–41)
Albumin: 2.7 g/dL — ABNORMAL LOW (ref 3.5–5.0)
Alkaline Phosphatase: 212 U/L — ABNORMAL HIGH (ref 38–126)
Anion gap: 9 (ref 5–15)
BUN: 17 mg/dL (ref 6–20)
CO2: 23 mmol/L (ref 22–32)
Calcium: 8.7 mg/dL — ABNORMAL LOW (ref 8.9–10.3)
Chloride: 102 mmol/L (ref 98–111)
Creatinine, Ser: 0.8 mg/dL (ref 0.61–1.24)
GFR, Estimated: >60 mL/min (ref >60)
Glucose, Bld: 141 mg/dL — ABNORMAL HIGH (ref 70–99)
Potassium: 4.1 mmol/L (ref 3.5–5.1)
Sodium: 134 mmol/L — ABNORMAL LOW (ref 135–145)
Total Bilirubin: 3.9 mg/dL — ABNORMAL HIGH (ref 0.0–1.2)
Total Protein: 7.5 g/dL (ref 6.5–8.1)

## 2023-09-25 LAB — EXPECTORATED SPUTUM ASSESSMENT W GRAM STAIN, RFLX TO RESP C: Special Requests: NORMAL

## 2023-09-25 LAB — LIPID PANEL
Cholesterol: 234 mg/dL — ABNORMAL HIGH (ref 0–200)
HDL: 13 mg/dL — ABNORMAL LOW (ref >40)
LDL Cholesterol: 187 mg/dL — ABNORMAL HIGH (ref 0–99)
Total CHOL/HDL Ratio: 18 ratio
Triglycerides: 169 mg/dL — ABNORMAL HIGH (ref <150)
VLDL: 34 mg/dL (ref 0–40)

## 2023-09-25 LAB — MAGNESIUM: Magnesium: 2.4 mg/dL (ref 1.7–2.4)

## 2023-09-25 LAB — PROTIME-INR
INR: 1.2 (ref 0.8–1.2)
Prothrombin Time: 15.5 s — ABNORMAL HIGH (ref 11.4–15.2)

## 2023-09-25 LAB — CORTISOL-AM, BLOOD: Cortisol - AM: 3.2 ug/dL — ABNORMAL LOW (ref 6.7–22.6)

## 2023-09-25 MED ORDER — ATORVASTATIN CALCIUM 40 MG PO TABS
40.0000 mg | ORAL_TABLET | Freq: Every evening | ORAL | Status: DC
  Administered 2023-09-25 – 2023-09-28 (×4): 40 mg via ORAL
  Filled 2023-09-25 (×4): qty 1

## 2023-09-25 MED ORDER — COSYNTROPIN 0.25 MG IJ SOLR
0.2500 mg | Freq: Once | INTRAMUSCULAR | Status: AC
  Administered 2023-09-26: 0.25 mg via INTRAVENOUS
  Filled 2023-09-25 (×2): qty 0.25

## 2023-09-25 NOTE — Telephone Encounter (Signed)
 Patient current admitted to the hospital with pneumonia and alcoholic hepatitis. GI is signing off. Please arrange follow-up for him in 2 weeks.

## 2023-09-25 NOTE — Plan of Care (Signed)
   Problem: Activity: Goal: Risk for activity intolerance will decrease Outcome: Progressing   Problem: Pain Managment: Goal: General experience of comfort will improve and/or be controlled Outcome: Progressing   Problem: Safety: Goal: Ability to remain free from injury will improve Outcome: Progressing

## 2023-09-25 NOTE — Hospital Course (Signed)
 53 y.o. male with medical history significant for OSA on CPAP, obesity, asthma, alcohol  use disorder, who presents to the ER due to shortness of breath for the past 4 days.  Associated with a persistent cough.  Endorses subjective fevers and intermittent chills.   In the ER, febrile, hypoxic requiring 4 L nasal cannula to maintain O2 saturation above 92%, tachycardic, tachypneic.  CT angio chest was negative for acute pulmonary embolism however showed interval development of diffuse, lower lung zone predominant, peribronchovascular patchy airspace opacities.  Findings may represent infection/inflammation versus alveolar hemorrhage.   Additionally, the patient had a CT abdomen and pelvis with contrast which revealed mediastinal lymphadenopathy, likely reactive in etiology.  Splenomegaly.   Lab work was notable for elevated liver chemistries and T. bili of 6.9.  Admits to excessive alcohol  use.   Due to concern for possible multifocal CAP, the patient was started on Rocephin  and IV azithromycin .  He also received IV Solu-Medrol , and bronchodilator nebulizers in the ER.  Admitted by [facility], hospitalist service.

## 2023-09-25 NOTE — Plan of Care (Signed)
   Problem: Education: Goal: Knowledge of General Education information will improve Description Including pain rating scale, medication(s)/side effects and non-pharmacologic comfort measures Outcome: Progressing   Problem: Education: Goal: Knowledge of General Education information will improve Description Including pain rating scale, medication(s)/side effects and non-pharmacologic comfort measures Outcome: Progressing

## 2023-09-25 NOTE — Telephone Encounter (Signed)
 Please do not schedule for follow-up with us  as he wants to follow-up in the [place] area.  He will reach his PCP to arrange follow-up in the GI clinic.

## 2023-09-25 NOTE — Progress Notes (Signed)
 PROGRESS NOTE   Ryan Holland  FMW:[id number] DOB: June 26, 1970 DOA: 09/23/2023 PCP: Dorina Loving, [name]   Chief Complaint  Patient presents with   Shortness of Breath        Level of care: Telemetry  Brief Admission History:  53 y.o. male with medical history significant for OSA on CPAP, obesity, asthma, alcohol  use disorder, who presents to the ER due to shortness of breath for the past 4 days.  Associated with a persistent cough.  Endorses subjective fevers and intermittent chills.   In the ER, febrile, hypoxic requiring 4 L nasal cannula to maintain O2 saturation above 92%, tachycardic, tachypneic.  CT angio chest was negative for acute pulmonary embolism however showed interval development of diffuse, lower lung zone predominant, peribronchovascular patchy airspace opacities.  Findings may represent infection/inflammation versus alveolar hemorrhage.   Additionally, the patient had a CT abdomen and pelvis with contrast which revealed mediastinal lymphadenopathy, likely reactive in etiology.  Splenomegaly.   Lab work was notable for elevated liver chemistries and T. bili of 6.9.  Admits to excessive alcohol  use.   Due to concern for possible multifocal CAP, the patient was started on Rocephin  and IV azithromycin .  He also received IV Solu-Medrol , and bronchodilator nebulizers in the ER.  Admitted by [facility], hospitalist service.    Assessment and Plan:  Acute hypoxic respiratory failure secondary to multifocal pneumonia and asthma exacerbation Denies use of vape Personally reviewed CT chest which showed bilateral lower lobe infiltrates/patchy opacities  Currently requiring 4 L to maintain O2 saturation above 92% Not on oxygen supplementation at baseline Continue empiric IV antibiotics for CAP Continue IV Solu-Medrol  Continue bronchodilator nebulizers Incentive spirometer Ambulation in room,  up to chair, ambulate every shift Wean off O2 supplementation as tolerated Pt continues  to have dyspnea symptoms, unchanged despite all of the treatments No findings of PE on CT scan; Pt had a 2D echo 10/24: no findings to explain his dyspnea If dyspnea persists may need pulmonary consult and repeat 2D echo   Acute asthma exacerbation in the setting of multifocal pneumonia Resumed home montelukast  and bronchodilators Continue Rocephin  and IV azithromycin  Continue IV Solu-Medrol  Continue bronchodilators nebulizers As needed antitussives   Alcohol  abuse with concern for alcohol  withdrawal CIWA protocol, added librium  taper as he reported 3 liters of alcohol  per day (vodka)  Multivitamins, thiamine , folic acid  supplement Interested in alcohol  cessation - consult to [facility]    Hepatic Steatosis  Elevated liver chemistries in the setting of alcohol  abuse Rule out other causes, follow acute hepatitis panel Avoid hepatotoxic agents. Repeat CMP Pt advised that he will need to continue to follow up with GI outpatient    Hyperbilirubinemia, jaundice CT abdomen pelvis with contrast was unrevealing, no biliary dilatation, no gallstone no focal liver abnormality   Splenomegaly Spleen measuring up to 17 cm.  No focal splenic lesion   Thrombocytopenia, mild Platelet count 128 in the setting of alcohol  abuse Check CBC in AM    Non anion gap metabolic acidosis Serum bicarb 19, anion gap 14 Received LR fluid in ED   Elevated troponin, suspect demand ischemia in the setting of hypoxia High-sensitivity troponin peaked at 20 and down trended. No evidence of acute ischemia on twelve-lead EKG Monitor with telemetry   Class III Obesity BMI 40 Recommend weight loss outpatient regular physical activity and healthy diet improved Consider outpatient referral to obesity treatment clinic  Dyslipidemia -- LDL 128, HDL 13, TC 234 -- atorvastatin  40 mg every evening   OSA CPAP  nightly Suspect he may have undiagnosed OHS  Hyponatremia -- sodium improved to 134 today   DVT  prophylaxis:  SCDs  Code Status: Full  Communication: discussed plan of care, test results with patient  Disposition: home    Consultants:  GI service  Procedures:   Antimicrobials:    Subjective: Pt reports he continues to feel short of breath, unchanged from yesterday.   Objective: Vitals:   09/25/23 0036 09/25/23 0503 09/25/23 0729 09/25/23 0852  BP: (!) 108/59 124/85  107/66  Pulse: 81 74  85  Resp: (!) 22 20    Temp: 97.9 F (36.6 C) 97.6 F (36.4 C)  97.8 F (36.6 C)  TempSrc: Axillary Oral  Oral  SpO2: 92% 93% 91% 92%  Weight:      Height:        Intake/Output Summary (Last 24 hours) at 09/25/2023 1244 Last data filed at 09/25/2023 0837 Gross per 24 hour  Intake 240 ml  Output 700 ml  Net -460 ml   Filed Weights   09/23/23 1955 09/24/23 0121 09/24/23 1731  Weight: 127.9 kg 127.7 kg 127.9 kg   Examination:  General exam: Appears calm and comfortable. Morbidly obese male.  Respiratory system: lungs sound fairly clear, rare expiratory wheeze. Respiratory effort normal. Cardiovascular system: normal S1 & S2 heard. No JVD, murmurs, rubs, gallops or clicks. No pedal edema. Gastrointestinal system: Abdomen is nondistended, soft and nontender. No organomegaly or masses felt. Normal bowel sounds heard. Central nervous system: Alert and oriented. No focal neurological deficits. Extremities: Symmetric 5 x 5 power. Skin: No rashes, lesions or ulcers. Psychiatry: Judgement and insight appear normal. Mood & affect appropriate.   Data Reviewed: I have personally reviewed following labs and imaging studies  CBC: Recent Labs  Lab 09/23/23 2005 09/24/23 0227  WBC 7.5 6.6  NEUTROABS 5.7 5.6  HGB 14.3 13.1  HCT 40.7 38.3*  MCV 104.4* 105.5*  PLT 128* 118*    Basic Metabolic Panel: Recent Labs  Lab 09/23/23 2005 09/24/23 0227 09/25/23 0439  NA 129* 128* 134*  K 4.1 3.9 4.1  CL 96* 95* 102  CO2 19* 19* 23  GLUCOSE 93 126* 141*  BUN 14 13 17   CREATININE  0.75 0.79 0.80  CALCIUM  8.7* 8.4* 8.7*  MG  --  1.4* 2.4  PHOS  --  2.2*  --     CBG: No results for input(s): GLUCAP in the last 168 hours.  Recent Results (from the past 240 hours)  Blood Culture (routine x 2)     Status: None (Preliminary result)   Collection Time: 09/23/23  8:05 PM   Specimen: Left Antecubital; Blood  Result Value Ref Range Status   Specimen Description LEFT ANTECUBITAL  Final   Special Requests   Final    BOTTLES DRAWN AEROBIC AND ANAEROBIC Blood Culture adequate volume   Culture   Final    NO GROWTH < 12 HOURS Performed at [facility], [address] [place], [place] [address]    Report Status PENDING  Incomplete  Blood Culture (routine x 2)     Status: None (Preliminary result)   Collection Time: 09/23/23  8:20 PM   Specimen: BLOOD LEFT HAND  Result Value Ref Range Status   Specimen Description BLOOD LEFT HAND  Final   Special Requests   Final    BOTTLES DRAWN AEROBIC AND ANAEROBIC Blood Culture adequate volume   Culture   Final    NO GROWTH < 12 HOURS Performed at [facility]  [facility], [address], [place], [place] [address]    Report Status PENDING  Incomplete  Resp panel by RT-PCR (RSV, Flu A&B, Covid) Anterior Nasal Swab     Status: None   Collection Time: 09/23/23  8:49 PM   Specimen: Anterior Nasal Swab  Result Value Ref Range Status   SARS Coronavirus 2 by RT PCR NEGATIVE NEGATIVE Final    Comment: (NOTE) SARS-CoV-2 target nucleic acids are NOT DETECTED.  The SARS-CoV-2 RNA is generally detectable in upper respiratory specimens during the acute phase of infection. The lowest concentration of SARS-CoV-2 viral copies this assay can detect is 138 copies/mL. A negative result does not preclude SARS-Cov-2 infection and should not be used as the sole basis for treatment or other patient management decisions. A negative result may occur with  improper specimen collection/handling, submission of specimen other than nasopharyngeal swab, presence of  viral mutation(s) within the areas targeted by this assay, and inadequate number of viral copies(<138 copies/mL). A negative result must be combined with clinical observations, patient history, and epidemiological information. The expected result is Negative.  Fact Sheet for Patients:  BloggerCourse.com  Fact Sheet for Healthcare Providers:  SeriousBroker.it  This test is no t yet approved or cleared by the [place]  FDA and  has been authorized for detection and/or diagnosis of SARS-CoV-2 by FDA under an Emergency Use Authorization (EUA). This EUA will remain  in effect (meaning this test can be used) for the duration of the COVID-19 declaration under Section 564(b)(1) of the Act, 21 U.S.C.section 360bbb-3(b)(1), unless the authorization is terminated  or revoked sooner.       Influenza A by PCR NEGATIVE NEGATIVE Final   Influenza B by PCR NEGATIVE NEGATIVE Final    Comment: (NOTE) The Xpert Xpress SARS-CoV-2/FLU/RSV plus assay is intended as an aid in the diagnosis of influenza from Nasopharyngeal swab specimens and should not be used as a sole basis for treatment. Nasal washings and aspirates are unacceptable for Xpert Xpress SARS-CoV-2/FLU/RSV testing.  Fact Sheet for Patients: BloggerCourse.com  Fact Sheet for Healthcare Providers: SeriousBroker.it  This test is not yet approved or cleared by the [place]  FDA and has been authorized for detection and/or diagnosis of SARS-CoV-2 by FDA under an Emergency Use Authorization (EUA). This EUA will remain in effect (meaning this test can be used) for the duration of the COVID-19 declaration under Section 564(b)(1) of the Act, 21 U.S.C. section 360bbb-3(b)(1), unless the authorization is terminated or revoked.     Resp Syncytial Virus by PCR NEGATIVE NEGATIVE Final    Comment: (NOTE) Fact Sheet for  Patients: BloggerCourse.com  Fact Sheet for Healthcare Providers: SeriousBroker.it  This test is not yet approved or cleared by the [place]  FDA and has been authorized for detection and/or diagnosis of SARS-CoV-2 by FDA under an Emergency Use Authorization (EUA). This EUA will remain in effect (meaning this test can be used) for the duration of the COVID-19 declaration under Section 564(b)(1) of the Act, 21 U.S.C. section 360bbb-3(b)(1), unless the authorization is terminated or revoked.  Performed at [facility], [address] [place], [place] [address]   MRSA Next Gen by PCR, Nasal     Status: None   Collection Time: 09/24/23  1:04 AM   Specimen: Nasal Mucosa; Nasal Swab  Result Value Ref Range Status   MRSA by PCR Next Gen NOT DETECTED NOT DETECTED Final    Comment: (NOTE) The GeneXpert MRSA Assay (FDA approved for NASAL specimens only), is one component of  a comprehensive MRSA colonization surveillance program. It is not intended to diagnose MRSA infection nor to guide or monitor treatment for MRSA infections. Test performance is not FDA approved in patients less than 39 years old. Performed at [facility], [address] [place], [place] [address]      Radiology Studies: US  ABDOMEN LIMITED WITH LIVER DOPPLER Result Date: 09/24/2023 CLINICAL DATA:  Elevated liver function tests EXAM: DUPLEX ULTRASOUND OF LIVER TECHNIQUE: Color and duplex Doppler ultrasound was performed to evaluate the hepatic in-flow and out-flow vessels. COMPARISON:  CT abdomen pelvis September 23, 2023 FINDINGS: Liver: Increased parenchymal echogenicity. Non cirrhotic morphology. No focal lesion, mass or intrahepatic biliary ductal dilatation. Main Portal Vein size: 1.4 cm Portal Vein Velocities Main Prox:  69 cm/sec Main Mid: 62 cm/sec Main Dist:  39 cm/sec Right: 30 cm/sec Left: 48 cm/sec Hepatic Vein Velocities Right:  50 cm/sec Middle:  45 cm/sec Left:   15 cm/sec IVC: Present and patent with normal respiratory phasicity.  80 cm/s Hepatic Artery Velocity:  92 cm/sec Portal Vein Occlusion/Thrombus: No Splenic Vein Occlusion/Thrombus: No Ascites: None Varices: None IMPRESSION: Increased hepatic echogenicity suggestive of hepatic steatosis. Correlate with the function tests. Otherwise normal doppler ultrasound. Electronically Signed   By: Megan  Zare M.D.   On: 09/24/2023 15:55   CT Angio Chest PE W/Cm &/Or Wo Cm Result Date: 09/23/2023 CLINICAL DATA:  Pulmonary embolism (PE) suspected, high prob; Epigastric pain c/o increased sob and not feeling good since Friday. When ems arrived pt o2 sat was in 80s. They placed pt on NRB @12lpm  Epigastric pain EXAM: CT ANGIOGRAPHY CHEST CT ABDOMEN AND PELVIS WITH CONTRAST TECHNIQUE: Multidetector CT imaging of the chest was performed using the standard protocol during bolus administration of intravenous contrast. Multiplanar CT image reconstructions and MIPs were obtained to evaluate the vascular anatomy. Multidetector CT imaging of the abdomen and pelvis was performed using the standard protocol during bolus administration of intravenous contrast. RADIATION DOSE REDUCTION: This exam was performed according to the departmental dose-optimization program which includes automated exposure control, adjustment of the mA and/or kV according to patient size and/or use of iterative reconstruction technique. CONTRAST:  75mL OMNIPAQUE  IOHEXOL  350 MG/ML SOLN COMPARISON:  Chest x-ray 09/23/2023, CT chest 12/12/2022 FINDINGS: CTA CHEST FINDINGS Cardiovascular: Satisfactory opacification of the pulmonary arteries to the segmental level. No evidence of pulmonary embolism. Normal heart size. No significant pericardial effusion. The thoracic aorta is normal in caliber. No atherosclerotic plaque of the thoracic aorta. No coronary artery calcifications. Mediastinum/Nodes: Several prominent nonenlarged mediastinal lymph nodes with as an example a  1.1 cm precarinal lymph node (2:41). No hilar or axillary lymph nodes. Thyroid  gland, trachea, and esophagus demonstrate no significant findings. Lungs/Pleura: Interval development of diffuse, lower lung zone predominant, peribronchovascular patchy airspace opacities. No pulmonary nodule. No pulmonary mass. No pleural effusion. No pneumothorax. Musculoskeletal: No chest wall abnormality. No suspicious lytic or blastic osseous lesions. No acute displaced fracture. Multilevel degenerative changes of the spine. Review of the MIP images confirms the above findings. CT ABDOMEN and PELVIS FINDINGS Hepatobiliary: No focal liver abnormality. No gallstones, gallbladder wall thickening, or pericholecystic fluid. No biliary dilatation. Pancreas: Diffusely atrophic. No focal lesion. Otherwise normal pancreatic contour. No surrounding inflammatory changes. No main pancreatic ductal dilatation. Spleen: Spleen enlarged measuring up to 17 cm. No focal splenic lesion. Adrenals/Urinary Tract: No adrenal nodule bilaterally. Bilateral kidneys enhance symmetrically. No hydronephrosis. No hydroureter. The urinary bladder is unremarkable. On delayed imaging, there is no urothelial wall thickening and there are  no filling defects in the opacified portions of the bilateral collecting systems or ureters. Stomach/Bowel: Stomach is within normal limits. No evidence of bowel wall thickening or dilatation. Appendix appears normal. Vascular/Lymphatic: No abdominal aorta or iliac aneurysm. Mild atherosclerotic plaque of the aorta and its branches. No abdominal, pelvic, or inguinal lymphadenopathy. Reproductive: Prostate is unremarkable. Other: No intraperitoneal free fluid. No intraperitoneal free gas. No organized fluid collection. Musculoskeletal: No abdominal wall hernia or abnormality. No suspicious lytic or blastic osseous lesions. No acute displaced fracture. Multilevel degenerative changes of the spine. Review of the MIP images confirms the  above findings. IMPRESSION: 1. Interval development of diffuse, lower lung zone predominant, peribronchovascular patchy airspace opacities. Findings may represente infection/inflammation (COVID-19 not excluded) versus alveolar hemorrhage. 2. Mediastinal lymphadenopathy. Finding likely reactive in etiology. Recommend attention on follow-up. 3. Splenomegaly. 4.  Aortic Atherosclerosis (ICD10-I70.0). Electronically Signed   By: Morgane  Naveau M.D.   On: 09/23/2023 21:26   CT ABDOMEN PELVIS W CONTRAST Result Date: 09/23/2023 CLINICAL DATA:  Pulmonary embolism (PE) suspected, high prob; Epigastric pain c/o increased sob and not feeling good since Friday. When ems arrived pt o2 sat was in 80s. They placed pt on NRB @12lpm  Epigastric pain EXAM: CT ANGIOGRAPHY CHEST CT ABDOMEN AND PELVIS WITH CONTRAST TECHNIQUE: Multidetector CT imaging of the chest was performed using the standard protocol during bolus administration of intravenous contrast. Multiplanar CT image reconstructions and MIPs were obtained to evaluate the vascular anatomy. Multidetector CT imaging of the abdomen and pelvis was performed using the standard protocol during bolus administration of intravenous contrast. RADIATION DOSE REDUCTION: This exam was performed according to the departmental dose-optimization program which includes automated exposure control, adjustment of the mA and/or kV according to patient size and/or use of iterative reconstruction technique. CONTRAST:  75mL OMNIPAQUE  IOHEXOL  350 MG/ML SOLN COMPARISON:  Chest x-ray 09/23/2023, CT chest 12/12/2022 FINDINGS: CTA CHEST FINDINGS Cardiovascular: Satisfactory opacification of the pulmonary arteries to the segmental level. No evidence of pulmonary embolism. Normal heart size. No significant pericardial effusion. The thoracic aorta is normal in caliber. No atherosclerotic plaque of the thoracic aorta. No coronary artery calcifications. Mediastinum/Nodes: Several prominent nonenlarged  mediastinal lymph nodes with as an example a 1.1 cm precarinal lymph node (2:41). No hilar or axillary lymph nodes. Thyroid  gland, trachea, and esophagus demonstrate no significant findings. Lungs/Pleura: Interval development of diffuse, lower lung zone predominant, peribronchovascular patchy airspace opacities. No pulmonary nodule. No pulmonary mass. No pleural effusion. No pneumothorax. Musculoskeletal: No chest wall abnormality. No suspicious lytic or blastic osseous lesions. No acute displaced fracture. Multilevel degenerative changes of the spine. Review of the MIP images confirms the above findings. CT ABDOMEN and PELVIS FINDINGS Hepatobiliary: No focal liver abnormality. No gallstones, gallbladder wall thickening, or pericholecystic fluid. No biliary dilatation. Pancreas: Diffusely atrophic. No focal lesion. Otherwise normal pancreatic contour. No surrounding inflammatory changes. No main pancreatic ductal dilatation. Spleen: Spleen enlarged measuring up to 17 cm. No focal splenic lesion. Adrenals/Urinary Tract: No adrenal nodule bilaterally. Bilateral kidneys enhance symmetrically. No hydronephrosis. No hydroureter. The urinary bladder is unremarkable. On delayed imaging, there is no urothelial wall thickening and there are no filling defects in the opacified portions of the bilateral collecting systems or ureters. Stomach/Bowel: Stomach is within normal limits. No evidence of bowel wall thickening or dilatation. Appendix appears normal. Vascular/Lymphatic: No abdominal aorta or iliac aneurysm. Mild atherosclerotic plaque of the aorta and its branches. No abdominal, pelvic, or inguinal lymphadenopathy. Reproductive: Prostate is unremarkable. Other: No intraperitoneal free fluid. No  intraperitoneal free gas. No organized fluid collection. Musculoskeletal: No abdominal wall hernia or abnormality. No suspicious lytic or blastic osseous lesions. No acute displaced fracture. Multilevel degenerative changes of the  spine. Review of the MIP images confirms the above findings. IMPRESSION: 1. Interval development of diffuse, lower lung zone predominant, peribronchovascular patchy airspace opacities. Findings may represente infection/inflammation (COVID-19 not excluded) versus alveolar hemorrhage. 2. Mediastinal lymphadenopathy. Finding likely reactive in etiology. Recommend attention on follow-up. 3. Splenomegaly. 4.  Aortic Atherosclerosis (ICD10-I70.0). Electronically Signed   By: Morgane  Naveau M.D.   On: 09/23/2023 21:26   DG Chest Port 1 View Result Date: 09/23/2023 EXAM: 1 VIEW XRAY OF THE CHEST 09/23/2023 08:23:00 PM COMPARISON: CT chest dated 09/21/2023. CLINICAL HISTORY: Questionable sepsis - evaluate for abnormality. Per triage SOB and not feeling good since Friday. When EMS arrived pt O2 sat was in 80s. FINDINGS: LUNGS AND PLEURA: Mild patchy interstitial/airspace opacities, lower lungs predominant, favoring interstitial/alveolar edema over pneumonia given rapid change. No pneumothorax. HEART AND MEDIASTINUM: Cardiomegaly. BONES AND SOFT TISSUES: No acute osseous abnormality. IMPRESSION: 1. Mild patchy interstitial/airspace opacities, favoring interstitial/alveolar edema over pneumonia. 2. Cardiomegaly. Electronically signed by: Pinkie Pebbles MD 09/23/2023 08:26 PM EDT RP Workstation: [id number]    Scheduled Meds:  atorvastatin   40 mg Oral QPM   chlordiazePOXIDE   10 mg Oral TID   Followed by   NOREEN ON 09/27/2023] chlordiazePOXIDE   5 mg Oral TID   citalopram   40 mg Oral Daily   [START ON 09/26/2023] cosyntropin   0.25 mg Intravenous Once   fluticasone  furoate-vilanterol  1 puff Inhalation Daily   folic acid   1 mg Oral Daily   ipratropium-albuterol   3 mL Nebulization TID   losartan   25 mg Oral Daily   methylPREDNISolone  (SOLU-MEDROL ) injection  40 mg Intravenous Daily   montelukast   10 mg Oral QHS   multivitamin with minerals  1 tablet Oral Daily   pantoprazole   40 mg Oral Daily   thiamine   100 mg  Oral Daily   Or   thiamine   100 mg Intravenous Daily   Continuous Infusions:  azithromycin  500 mg (09/24/23 2033)   cefTRIAXone  (ROCEPHIN )  IV 2 g (09/25/23 1014)     LOS: 2 days   Time spent: 56 mins  Imari Sivertsen Vicci, MD How to contact the [facility] Attending or Consulting provider 7A - 7P or covering provider during after hours 7P -7A, for this patient?  Check the care team in [facility] and look for a) attending/consulting [facility] provider listed and b) the [facility] team listed Log into [email or web address] to find provider on call.  Locate the [facility] provider you are looking for under [facility] and page to a number that you can be directly reached. If you still have difficulty reaching the provider, please page the [facility] (Director on Call) for the Hospitalists listed on amion for assistance.  09/25/2023, 12:44 PM

## 2023-09-25 NOTE — Progress Notes (Signed)
 Subjective: Reports he is feeling well overall. No confusion, disorientation, abdominal distention, lower extremity edema, bright red blood per rectum, melena, nausea, vomiting, abdominal pain.  Objective: Vital signs in last 24 hours: Temp:  [97.6 F (36.4 C)-98.2 F (36.8 C)] 97.8 F (36.6 C) (07/22 0852) Pulse Rate:  [74-102] 85 (07/22 0852) Resp:  [16-27] 20 (07/22 0503) BP: (107-146)/(59-94) 107/66 (07/22 0852) SpO2:  [90 %-94 %] 92 % (07/22 0852) Weight:  [127.9 kg] 127.9 kg (07/21 1731) Last BM Date : 09/23/23 General:   Alert and oriented, pleasant, NAD.  Head:  Normocephalic and atraumatic. Eyes:  scleral icterus.  Abdomen:  Full, soft, non tender, positive bowel sounds in all 4 quadrants.  Extremities:  Without edema. Neurologic:  Alert and  oriented x4;  grossly normal neurologically. Psych:  Alert and cooperative. Normal mood and affect.  Intake/Output from previous day: 07/21 0701 - 07/22 0700 In: 240 [P.O.:240] Out: 1225 [Urine:1225] Intake/Output this shift: Total I/O In: 240 [P.O.:240] Out: -   Lab Results: Recent Labs    09/23/23 2005 09/24/23 0227  WBC 7.5 6.6  HGB 14.3 13.1  HCT 40.7 38.3*  PLT 128* 118*   BMET Recent Labs    09/23/23 2005 09/24/23 0227 09/25/23 0439  NA 129* 128* 134*  K 4.1 3.9 4.1  CL 96* 95* 102  CO2 19* 19* 23  GLUCOSE 93 126* 141*  BUN 14 13 17   CREATININE 0.75 0.79 0.80  CALCIUM  8.7* 8.4* 8.7*   LFT Recent Labs    09/23/23 2005 09/24/23 0227 09/25/23 0439  PROT 7.6 7.2 7.5  ALBUMIN 2.8* 2.7* 2.7*  AST 242* 228* 182*  ALT 66* 60* 70*  ALKPHOS 195* 189* 212*  BILITOT 6.9* 6.6* 3.9*   PT/INR Recent Labs    09/23/23 2005 09/25/23 0907  LABPROT 14.9 15.5*  INR 1.1 1.2   Hepatitis Panel Recent Labs    09/23/23 2245  HEPBSAG NON REACTIVE  HCVAB NON REACTIVE  HEPAIGM NON REACTIVE  HEPBIGM NON REACTIVE     Studies/Results: US  ABDOMEN LIMITED WITH LIVER DOPPLER Result Date:  09/24/2023 CLINICAL DATA:  Elevated liver function tests EXAM: DUPLEX ULTRASOUND OF LIVER TECHNIQUE: Color and duplex Doppler ultrasound was performed to evaluate the hepatic in-flow and out-flow vessels. COMPARISON:  CT abdomen pelvis September 23, 2023 FINDINGS: Liver: Increased parenchymal echogenicity. Non cirrhotic morphology. No focal lesion, mass or intrahepatic biliary ductal dilatation. Main Portal Vein size: 1.4 cm Portal Vein Velocities Main Prox:  69 cm/sec Main Mid: 62 cm/sec Main Dist:  39 cm/sec Right: 30 cm/sec Left: 48 cm/sec Hepatic Vein Velocities Right:  50 cm/sec Middle:  45 cm/sec Left:  15 cm/sec IVC: Present and patent with normal respiratory phasicity.  80 cm/s Hepatic Artery Velocity:  92 cm/sec Portal Vein Occlusion/Thrombus: No Splenic Vein Occlusion/Thrombus: No Ascites: None Varices: None IMPRESSION: Increased hepatic echogenicity suggestive of hepatic steatosis. Correlate with the function tests. Otherwise normal doppler ultrasound. Electronically Signed   By: Megan  Zare M.D.   On: 09/24/2023 15:55   CT Angio Chest PE W/Cm &/Or Wo Cm Result Date: 09/23/2023 CLINICAL DATA:  Pulmonary embolism (PE) suspected, high prob; Epigastric pain c/o increased sob and not feeling good since Friday. When ems arrived pt o2 sat was in 80s. They placed pt on NRB @12lpm  Epigastric pain EXAM: CT ANGIOGRAPHY CHEST CT ABDOMEN AND PELVIS WITH CONTRAST TECHNIQUE: Multidetector CT imaging of the chest was performed using the standard protocol during bolus administration of intravenous contrast. Multiplanar CT  image reconstructions and MIPs were obtained to evaluate the vascular anatomy. Multidetector CT imaging of the abdomen and pelvis was performed using the standard protocol during bolus administration of intravenous contrast. RADIATION DOSE REDUCTION: This exam was performed according to the departmental dose-optimization program which includes automated exposure control, adjustment of the mA and/or kV  according to patient size and/or use of iterative reconstruction technique. CONTRAST:  75mL OMNIPAQUE  IOHEXOL  350 MG/ML SOLN COMPARISON:  Chest x-ray 09/23/2023, CT chest 12/12/2022 FINDINGS: CTA CHEST FINDINGS Cardiovascular: Satisfactory opacification of the pulmonary arteries to the segmental level. No evidence of pulmonary embolism. Normal heart size. No significant pericardial effusion. The thoracic aorta is normal in caliber. No atherosclerotic plaque of the thoracic aorta. No coronary artery calcifications. Mediastinum/Nodes: Several prominent nonenlarged mediastinal lymph nodes with as an example a 1.1 cm precarinal lymph node (2:41). No hilar or axillary lymph nodes. Thyroid  gland, trachea, and esophagus demonstrate no significant findings. Lungs/Pleura: Interval development of diffuse, lower lung zone predominant, peribronchovascular patchy airspace opacities. No pulmonary nodule. No pulmonary mass. No pleural effusion. No pneumothorax. Musculoskeletal: No chest wall abnormality. No suspicious lytic or blastic osseous lesions. No acute displaced fracture. Multilevel degenerative changes of the spine. Review of the MIP images confirms the above findings. CT ABDOMEN and PELVIS FINDINGS Hepatobiliary: No focal liver abnormality. No gallstones, gallbladder wall thickening, or pericholecystic fluid. No biliary dilatation. Pancreas: Diffusely atrophic. No focal lesion. Otherwise normal pancreatic contour. No surrounding inflammatory changes. No main pancreatic ductal dilatation. Spleen: Spleen enlarged measuring up to 17 cm. No focal splenic lesion. Adrenals/Urinary Tract: No adrenal nodule bilaterally. Bilateral kidneys enhance symmetrically. No hydronephrosis. No hydroureter. The urinary bladder is unremarkable. On delayed imaging, there is no urothelial wall thickening and there are no filling defects in the opacified portions of the bilateral collecting systems or ureters. Stomach/Bowel: Stomach is within  normal limits. No evidence of bowel wall thickening or dilatation. Appendix appears normal. Vascular/Lymphatic: No abdominal aorta or iliac aneurysm. Mild atherosclerotic plaque of the aorta and its branches. No abdominal, pelvic, or inguinal lymphadenopathy. Reproductive: Prostate is unremarkable. Other: No intraperitoneal free fluid. No intraperitoneal free gas. No organized fluid collection. Musculoskeletal: No abdominal wall hernia or abnormality. No suspicious lytic or blastic osseous lesions. No acute displaced fracture. Multilevel degenerative changes of the spine. Review of the MIP images confirms the above findings. IMPRESSION: 1. Interval development of diffuse, lower lung zone predominant, peribronchovascular patchy airspace opacities. Findings may represente infection/inflammation (COVID-19 not excluded) versus alveolar hemorrhage. 2. Mediastinal lymphadenopathy. Finding likely reactive in etiology. Recommend attention on follow-up. 3. Splenomegaly. 4.  Aortic Atherosclerosis (ICD10-I70.0). Electronically Signed   By: Morgane  Naveau M.D.   On: 09/23/2023 21:26   CT ABDOMEN PELVIS W CONTRAST Result Date: 09/23/2023 CLINICAL DATA:  Pulmonary embolism (PE) suspected, high prob; Epigastric pain c/o increased sob and not feeling good since Friday. When ems arrived pt o2 sat was in 80s. They placed pt on NRB @12lpm  Epigastric pain EXAM: CT ANGIOGRAPHY CHEST CT ABDOMEN AND PELVIS WITH CONTRAST TECHNIQUE: Multidetector CT imaging of the chest was performed using the standard protocol during bolus administration of intravenous contrast. Multiplanar CT image reconstructions and MIPs were obtained to evaluate the vascular anatomy. Multidetector CT imaging of the abdomen and pelvis was performed using the standard protocol during bolus administration of intravenous contrast. RADIATION DOSE REDUCTION: This exam was performed according to the departmental dose-optimization program which includes automated exposure  control, adjustment of the mA and/or kV according to patient size and/or use of  iterative reconstruction technique. CONTRAST:  75mL OMNIPAQUE  IOHEXOL  350 MG/ML SOLN COMPARISON:  Chest x-ray 09/23/2023, CT chest 12/12/2022 FINDINGS: CTA CHEST FINDINGS Cardiovascular: Satisfactory opacification of the pulmonary arteries to the segmental level. No evidence of pulmonary embolism. Normal heart size. No significant pericardial effusion. The thoracic aorta is normal in caliber. No atherosclerotic plaque of the thoracic aorta. No coronary artery calcifications. Mediastinum/Nodes: Several prominent nonenlarged mediastinal lymph nodes with as an example a 1.1 cm precarinal lymph node (2:41). No hilar or axillary lymph nodes. Thyroid  gland, trachea, and esophagus demonstrate no significant findings. Lungs/Pleura: Interval development of diffuse, lower lung zone predominant, peribronchovascular patchy airspace opacities. No pulmonary nodule. No pulmonary mass. No pleural effusion. No pneumothorax. Musculoskeletal: No chest wall abnormality. No suspicious lytic or blastic osseous lesions. No acute displaced fracture. Multilevel degenerative changes of the spine. Review of the MIP images confirms the above findings. CT ABDOMEN and PELVIS FINDINGS Hepatobiliary: No focal liver abnormality. No gallstones, gallbladder wall thickening, or pericholecystic fluid. No biliary dilatation. Pancreas: Diffusely atrophic. No focal lesion. Otherwise normal pancreatic contour. No surrounding inflammatory changes. No main pancreatic ductal dilatation. Spleen: Spleen enlarged measuring up to 17 cm. No focal splenic lesion. Adrenals/Urinary Tract: No adrenal nodule bilaterally. Bilateral kidneys enhance symmetrically. No hydronephrosis. No hydroureter. The urinary bladder is unremarkable. On delayed imaging, there is no urothelial wall thickening and there are no filling defects in the opacified portions of the bilateral collecting systems or  ureters. Stomach/Bowel: Stomach is within normal limits. No evidence of bowel wall thickening or dilatation. Appendix appears normal. Vascular/Lymphatic: No abdominal aorta or iliac aneurysm. Mild atherosclerotic plaque of the aorta and its branches. No abdominal, pelvic, or inguinal lymphadenopathy. Reproductive: Prostate is unremarkable. Other: No intraperitoneal free fluid. No intraperitoneal free gas. No organized fluid collection. Musculoskeletal: No abdominal wall hernia or abnormality. No suspicious lytic or blastic osseous lesions. No acute displaced fracture. Multilevel degenerative changes of the spine. Review of the MIP images confirms the above findings. IMPRESSION: 1. Interval development of diffuse, lower lung zone predominant, peribronchovascular patchy airspace opacities. Findings may represente infection/inflammation (COVID-19 not excluded) versus alveolar hemorrhage. 2. Mediastinal lymphadenopathy. Finding likely reactive in etiology. Recommend attention on follow-up. 3. Splenomegaly. 4.  Aortic Atherosclerosis (ICD10-I70.0). Electronically Signed   By: Morgane  Naveau M.D.   On: 09/23/2023 21:26   DG Chest Port 1 View Result Date: 09/23/2023 EXAM: 1 VIEW XRAY OF THE CHEST 09/23/2023 08:23:00 PM COMPARISON: CT chest dated 09/21/2023. CLINICAL HISTORY: Questionable sepsis - evaluate for abnormality. Per triage SOB and not feeling good since Friday. When EMS arrived pt O2 sat was in 80s. FINDINGS: LUNGS AND PLEURA: Mild patchy interstitial/airspace opacities, lower lungs predominant, favoring interstitial/alveolar edema over pneumonia given rapid change. No pneumothorax. HEART AND MEDIASTINUM: Cardiomegaly. BONES AND SOFT TISSUES: No acute osseous abnormality. IMPRESSION: 1. Mild patchy interstitial/airspace opacities, favoring interstitial/alveolar edema over pneumonia. 2. Cardiomegaly. Electronically signed by: Pinkie Pebbles MD 09/23/2023 08:26 PM EDT RP Workstation: [id number]     Assessment: 53 y.o. year old male with history of anxiety, GERD, HTN, sleep apnea who presented to the ED with increased SOB, cough, congestion x4 days.Found to have elevated LFTs on admission. GI consulted for further evaluation   Elevated LFTs: -Chronic LFT elevation at least in 2019. - On admission AST 242, ALT 66, Alk pho 189, T bili 6.9 - Today AST 182, ALT 70, alk phos 212, total bilirubin 3.9, INR 1.2. - RUQ US  done 6/27as outpatient with fatty infiltration of liver, no shadowing stones or  ductal dilatation - RUQ ultrasound with liver Doppler yesterday suggestive of hepatic steatosis, otherwise normal exam. - Admits to frequent ETOH--2-3L of vodka per week for many years - Acute hepatitis panel and HIV antibody negative. - Acetaminophen  level within normal limits. - Patient has no symptoms of acute liver failure.  - Acute on chronic LFT elevation likely secondary to alcoholic hepatitis.  Encouragingly, patient has no symptoms of acute liver failure and AST and bilirubin are trending down. DF 15.4 with PT control of 13.  Regardless, patient would not be a candidate for prednisone  at this time due to pneumonia.     Plan: Continue to trend LFTs daily.  Monitor for development of mental status changes.  Continue supportive measures. Avoid hepatic toxic medications. Patient needs strict alcohol  abstinence.  He is interested in discussing inpatient and outpatient options to assist with this.  I will notify hospitalist about this. GI will sign off.  Will arrange for follow-up in the clinic in 2-4 weeks.  Please reconsult if needed.   LOS: 2 days    09/25/2023, 1:22 PM   [facility], [facility]

## 2023-09-25 NOTE — Progress Notes (Addendum)
 Nurse at bedside,patient alert and oriented times four.Patient c/o back pain and bilateral rib cage pain ,rated a 4,achy ,discomfort that's constant.Robaxin  500 mg's given by mouth for pain per MAR prn.Blood pressure 107/66,heart rate 85,Dr Johnson notified,holding the cozaar  this am.Plan of care on going.

## 2023-09-25 NOTE — Telephone Encounter (Signed)
 Thanks Dr. Eartha.

## 2023-09-26 DIAGNOSIS — J181 Lobar pneumonia, unspecified organism: Secondary | ICD-10-CM | POA: Diagnosis not present

## 2023-09-26 DIAGNOSIS — K7010 Alcoholic hepatitis without ascites: Secondary | ICD-10-CM

## 2023-09-26 DIAGNOSIS — J4551 Severe persistent asthma with (acute) exacerbation: Secondary | ICD-10-CM

## 2023-09-26 DIAGNOSIS — J9601 Acute respiratory failure with hypoxia: Secondary | ICD-10-CM | POA: Diagnosis not present

## 2023-09-26 LAB — COMPREHENSIVE METABOLIC PANEL WITH GFR
ALT: 71 U/L — ABNORMAL HIGH (ref 0–44)
AST: 164 U/L — ABNORMAL HIGH (ref 15–41)
Albumin: 2.3 g/dL — ABNORMAL LOW (ref 3.5–5.0)
Alkaline Phosphatase: 187 U/L — ABNORMAL HIGH (ref 38–126)
Anion gap: 10 (ref 5–15)
BUN: 20 mg/dL (ref 6–20)
CO2: 22 mmol/L (ref 22–32)
Calcium: 8.4 mg/dL — ABNORMAL LOW (ref 8.9–10.3)
Chloride: 104 mmol/L (ref 98–111)
Creatinine, Ser: 0.65 mg/dL (ref 0.61–1.24)
GFR, Estimated: >60 mL/min (ref >60)
Glucose, Bld: 115 mg/dL — ABNORMAL HIGH (ref 70–99)
Potassium: 3.9 mmol/L (ref 3.5–5.1)
Sodium: 136 mmol/L (ref 135–145)
Total Bilirubin: 2.6 mg/dL — ABNORMAL HIGH (ref 0.0–1.2)
Total Protein: 6.4 g/dL — ABNORMAL LOW (ref 6.5–8.1)

## 2023-09-26 LAB — RAPID URINE DRUG SCREEN, HOSP PERFORMED
Amphetamines: NOT DETECTED
Barbiturates: NOT DETECTED
Benzodiazepines: POSITIVE — AB
Cocaine: NOT DETECTED
Opiates: NOT DETECTED
Tetrahydrocannabinol: NOT DETECTED

## 2023-09-26 MED ORDER — ARFORMOTEROL TARTRATE 15 MCG/2ML IN NEBU
15.0000 ug | INHALATION_SOLUTION | Freq: Two times a day (BID) | RESPIRATORY_TRACT | Status: DC
  Administered 2023-09-26 – 2023-09-29 (×6): 15 ug via RESPIRATORY_TRACT
  Filled 2023-09-26 (×6): qty 2

## 2023-09-26 MED ORDER — METHYLPREDNISOLONE SODIUM SUCC 125 MG IJ SOLR
60.0000 mg | Freq: Two times a day (BID) | INTRAMUSCULAR | Status: DC
  Administered 2023-09-26 – 2023-09-28 (×5): 60 mg via INTRAVENOUS
  Filled 2023-09-26 (×6): qty 2

## 2023-09-26 MED ORDER — LORATADINE 10 MG PO TABS
10.0000 mg | ORAL_TABLET | Freq: Every day | ORAL | Status: DC
  Administered 2023-09-26 – 2023-09-29 (×4): 10 mg via ORAL
  Filled 2023-09-26 (×4): qty 1

## 2023-09-26 MED ORDER — BUDESONIDE 0.5 MG/2ML IN SUSP
0.5000 mg | Freq: Two times a day (BID) | RESPIRATORY_TRACT | Status: DC
  Administered 2023-09-26 – 2023-09-29 (×6): 0.5 mg via RESPIRATORY_TRACT
  Filled 2023-09-26 (×6): qty 2

## 2023-09-26 NOTE — Plan of Care (Signed)
   Problem: Education: Goal: Knowledge of General Education information will improve Description Including pain rating scale, medication(s)/side effects and non-pharmacologic comfort measures Outcome: Progressing   Problem: Education: Goal: Knowledge of General Education information will improve Description Including pain rating scale, medication(s)/side effects and non-pharmacologic comfort measures Outcome: Progressing

## 2023-09-26 NOTE — Plan of Care (Signed)

## 2023-09-26 NOTE — Progress Notes (Signed)
 Nurse at bedside,patient alert and oriented times four. Blood pressure was 105/61,Dr Tat notified,holding the Losartan  this am. Plan of care on going.

## 2023-09-26 NOTE — Progress Notes (Signed)
 Patient placed on Droplet precaution per Infection control policy,patient educated on Droplet precaution,verbalized understanding,educational carenotes provided.Plan of care on going.

## 2023-09-26 NOTE — Progress Notes (Signed)
 PROGRESS NOTE  Ryan Holland FMW:[id number] DOB: 02-19-1971 DOA: 09/23/2023 PCP: Dorina Loving, [name]  Brief History:  53 y.o. male with medical history significant for OSA on CPAP, obesity, asthma, alcohol  use disorder, who presents to the ER due to shortness of breath for the past 4 days.  Associated with a persistent cough.  Endorses subjective fevers and intermittent chills.   In the ER, febrile, hypoxic requiring 4 L nasal cannula to maintain O2 saturation above 92%, tachycardic, tachypneic.  CT angio chest was negative for acute pulmonary embolism however showed interval development of diffuse, lower lung zone predominant, peribronchovascular patchy airspace opacities.  Findings may represent infection/inflammation versus alveolar hemorrhage.   Additionally, the patient had a CT abdomen and pelvis with contrast which revealed mediastinal lymphadenopathy, likely reactive in etiology.  Splenomegaly.   Lab work was notable for elevated liver chemistries and T. bili of 6.9.  Admits to excessive alcohol  use.   Due to concern for possible multifocal CAP, the patient was started on Rocephin  and IV azithromycin .  He also received IV Solu-Medrol , and bronchodilator nebulizers in the ER.  Admitted by [facility], hospitalist service.     Assessment/Plan: Acute hypoxic respiratory failure secondary to Lobar pneumonia and asthma exacerbation  -Personally reviewed CT chest which showed bilateral lower lobe infiltrates/patchy opacities  -Currently requiring 4 L to maintain O2 saturation above 92% -wean oxygen for saturation >92% - PCT 0.88 -Continue empiric IV ceftiraxone and azithro -Continue IV Solu-Medrol  -add brovana  and pulmicort  -Incentive spirometer -add loratidine -viral respiratory panel   Acute asthma exacerbation -continue BDs as above -Continue Rocephin  and IV azithromycin  -Continue IV Solu-Medrol  -add hycodan   Alcohol  abuse with concern for alcohol  withdrawal -CIWA  protocol, added librium  taper as he reported 3 liters of alcohol  per day (vodka)  -Multivitamins, thiamine , folic acid  supplement Interested in alcohol  cessation - consult to [facility]    Hepatic Steatosis  Alcoholic hepatitis -viral hepatitis panel neg -Avoid hepatotoxic agents. -Repeat CMP -Pt advised that he will need to continue to follow up with GI outpatient    Hyperbilirubinemia, jaundice -CT abdomen pelvis with contrast was unrevealing, no biliary dilatation, no gallstone no focal liver abnormality -due to cholestasis -bili trending down -liver doppler--steatosis, no portal venous thrombus   Splenomegaly -Spleen measuring up to 17 cm.  No focal splenic lesion   Thrombocytopenia, mild -Platelet count 128 in the setting of alcohol  abuse -viral resp panel -B12 -folate  Non anion gap metabolic acidosis Serum bicarb 19, anion gap 14 Received LR fluid in ED -improved   Elevated troponin, suspect demand ischemia in the setting of hypoxia High-sensitivity troponin peaked at 20>>16 No evidence of acute ischemia on twelve-lead EKG -obtain echo   Class III Obesity BMI 40.46 -lifestyle modification   Dyslipidemia -- LDL 128, HDL 13, TC 234 -- atorvastatin  40 mg every evening    OSA CPAP nightly Suspect he may have undiagnosed OHS   Hyponatremia -- sodium improved   Low cortisol -collected after pt was given IV steroids -dubious clinical significance          Family Communication:   spouse 7/23  Consultants:  none  Code Status:  FULL  DVT Prophylaxis:  SCDs   Procedures: As Listed in Progress Note Above  Antibiotics: Ceftriaxone  7/20>> Azithro 7/20>>      Subjective: Pt breathing better, but remains sob with minimal exertion.  Denies f/c, cp, n/v/d, abd pain  Objective: Vitals:   09/26/23 0733 09/26/23  9049 09/26/23 1355 09/26/23 1402  BP:  105/61 112/68   Pulse:  84 76   Resp:      Temp:  (!) 97.3 F (36.3 C) 97.9 F (36.6 C)    TempSrc:  Oral Oral   SpO2: 100% 94% 96% 95%  Weight:      Height:        Intake/Output Summary (Last 24 hours) at 09/26/2023 1648 Last data filed at 09/26/2023 1004 Gross per 24 hour  Intake 1080 ml  Output 900 ml  Net 180 ml   Weight change:  Exam:  General:  Pt is alert, follows commands appropriately, not in acute distress HEENT: No icterus, No thrush, No neck mass, [place]/AT Cardiovascular: RRR, S1/S2, no rubs, no gallops Respiratory: bilateral exp wheeze.  Bibasilar rales. Abdomen: Soft/+BS, non tender, non distended, no guarding Extremities: No edema, No lymphangitis, No petechiae, No rashes, no synovitis   Data Reviewed: I have personally reviewed following labs and imaging studies Basic Metabolic Panel: Recent Labs  Lab 09/23/23 2005 09/24/23 0227 09/25/23 0439 09/26/23 0423  NA 129* 128* 134* 136  K 4.1 3.9 4.1 3.9  CL 96* 95* 102 104  CO2 19* 19* 23 22  GLUCOSE 93 126* 141* 115*  BUN 14 13 17 20   CREATININE 0.75 0.79 0.80 0.65  CALCIUM  8.7* 8.4* 8.7* 8.4*  MG  --  1.4* 2.4  --   PHOS  --  2.2*  --   --    Liver Function Tests: Recent Labs  Lab 09/23/23 2005 09/24/23 0227 09/25/23 0439 09/26/23 0423  AST 242* 228* 182* 164*  ALT 66* 60* 70* 71*  ALKPHOS 195* 189* 212* 187*  BILITOT 6.9* 6.6* 3.9* 2.6*  PROT 7.6 7.2 7.5 6.4*  ALBUMIN 2.8* 2.7* 2.7* 2.3*   No results for input(s): LIPASE, AMYLASE in the last 168 hours. No results for input(s): AMMONIA in the last 168 hours. Coagulation Profile: Recent Labs  Lab 09/23/23 2005 09/25/23 0907  INR 1.1 1.2   CBC: Recent Labs  Lab 09/23/23 2005 09/24/23 0227  WBC 7.5 6.6  NEUTROABS 5.7 5.6  HGB 14.3 13.1  HCT 40.7 38.3*  MCV 104.4* 105.5*  PLT 128* 118*   Cardiac Enzymes: No results for input(s): CKTOTAL, CKMB, CKMBINDEX, TROPONINI in the last 168 hours. BNP: Invalid input(s): POCBNP CBG: No results for input(s): GLUCAP in the last 168 hours. HbA1C: Recent Labs     09/24/23 0227  HGBA1C 4.2*   Urine analysis:    Component Value Date/Time   COLORURINE AMBER (A) 09/24/2023 0300   APPEARANCEUR CLEAR 09/24/2023 0300   LABSPEC >1.046 (H) 09/24/2023 0300   PHURINE 6.0 09/24/2023 0300   GLUCOSEU NEGATIVE 09/24/2023 0300   HGBUR NEGATIVE 09/24/2023 0300   BILIRUBINUR SMALL (A) 09/24/2023 0300   BILIRUBINUR negative 03/17/2021 1015   KETONESUR NEGATIVE 09/24/2023 0300   PROTEINUR NEGATIVE 09/24/2023 0300   UROBILINOGEN 1.0 03/17/2021 1015   UROBILINOGEN 0.2 07/24/2011 1033   NITRITE NEGATIVE 09/24/2023 0300   LEUKOCYTESUR NEGATIVE 09/24/2023 0300   Sepsis Labs: @LABRCNTIP (procalcitonin:4,lacticidven:4) ) Recent Results (from the past 240 hours)  Blood Culture (routine x 2)     Status: None (Preliminary result)   Collection Time: 09/23/23  8:05 PM   Specimen: Left Antecubital; Blood  Result Value Ref Range Status   Specimen Description LEFT ANTECUBITAL  Final   Special Requests   Final    BOTTLES DRAWN AEROBIC AND ANAEROBIC Blood Culture adequate volume   Culture   Final  NO GROWTH 3 DAYS Performed at [facility], [address], [place], [place] [address]    Report Status PENDING  Incomplete  Blood Culture (routine x 2)     Status: None (Preliminary result)   Collection Time: 09/23/23  8:20 PM   Specimen: BLOOD LEFT HAND  Result Value Ref Range Status   Specimen Description BLOOD LEFT HAND  Final   Special Requests   Final    BOTTLES DRAWN AEROBIC AND ANAEROBIC Blood Culture adequate volume   Culture   Final    NO GROWTH 3 DAYS Performed at [facility], [address] [place], [place] [address]    Report Status PENDING  Incomplete  Resp panel by RT-PCR (RSV, Flu A&B, Covid) Anterior Nasal Swab     Status: None   Collection Time: 09/23/23  8:49 PM   Specimen: Anterior Nasal Swab  Result Value Ref Range Status   SARS Coronavirus 2 by RT PCR NEGATIVE NEGATIVE Final    Comment: (NOTE) SARS-CoV-2 target nucleic acids are NOT  DETECTED.  The SARS-CoV-2 RNA is generally detectable in upper respiratory specimens during the acute phase of infection. The lowest concentration of SARS-CoV-2 viral copies this assay can detect is 138 copies/mL. A negative result does not preclude SARS-Cov-2 infection and should not be used as the sole basis for treatment or other patient management decisions. A negative result may occur with  improper specimen collection/handling, submission of specimen other than nasopharyngeal swab, presence of viral mutation(s) within the areas targeted by this assay, and inadequate number of viral copies(<138 copies/mL). A negative result must be combined with clinical observations, patient history, and epidemiological information. The expected result is Negative.  Fact Sheet for Patients:  BloggerCourse.com  Fact Sheet for Healthcare Providers:  SeriousBroker.it  This test is no t yet approved or cleared by the [place]  FDA and  has been authorized for detection and/or diagnosis of SARS-CoV-2 by FDA under an Emergency Use Authorization (EUA). This EUA will remain  in effect (meaning this test can be used) for the duration of the COVID-19 declaration under Section 564(b)(1) of the Act, 21 U.S.C.section 360bbb-3(b)(1), unless the authorization is terminated  or revoked sooner.       Influenza A by PCR NEGATIVE NEGATIVE Final   Influenza B by PCR NEGATIVE NEGATIVE Final    Comment: (NOTE) The Xpert Xpress SARS-CoV-2/FLU/RSV plus assay is intended as an aid in the diagnosis of influenza from Nasopharyngeal swab specimens and should not be used as a sole basis for treatment. Nasal washings and aspirates are unacceptable for Xpert Xpress SARS-CoV-2/FLU/RSV testing.  Fact Sheet for Patients: BloggerCourse.com  Fact Sheet for Healthcare Providers: SeriousBroker.it  This test is not yet  approved or cleared by the [place]  FDA and has been authorized for detection and/or diagnosis of SARS-CoV-2 by FDA under an Emergency Use Authorization (EUA). This EUA will remain in effect (meaning this test can be used) for the duration of the COVID-19 declaration under Section 564(b)(1) of the Act, 21 U.S.C. section 360bbb-3(b)(1), unless the authorization is terminated or revoked.     Resp Syncytial Virus by PCR NEGATIVE NEGATIVE Final    Comment: (NOTE) Fact Sheet for Patients: BloggerCourse.com  Fact Sheet for Healthcare Providers: SeriousBroker.it  This test is not yet approved or cleared by the [place]  FDA and has been authorized for detection and/or diagnosis of SARS-CoV-2 by FDA under an Emergency Use Authorization (EUA). This EUA will remain in effect (meaning this test can be used) for  the duration of the COVID-19 declaration under Section 564(b)(1) of the Act, 21 U.S.C. section 360bbb-3(b)(1), unless the authorization is terminated or revoked.  Performed at [facility], [address], [place], [place] [address]   MRSA Next Gen by PCR, Nasal     Status: None   Collection Time: 09/24/23  1:04 AM   Specimen: Nasal Mucosa; Nasal Swab  Result Value Ref Range Status   MRSA by PCR Next Gen NOT DETECTED NOT DETECTED Final    Comment: (NOTE) The GeneXpert MRSA Assay (FDA approved for NASAL specimens only), is one component of a comprehensive MRSA colonization surveillance program. It is not intended to diagnose MRSA infection nor to guide or monitor treatment for MRSA infections. Test performance is not FDA approved in patients less than 47 years old. Performed at [facility], [address], [place], [place] [address]   Expectorated Sputum Assessment w Gram Stain, Rflx to Resp Cult     Status: None   Collection Time: 09/25/23  1:57 PM   Specimen: Expectorated Sputum  Result Value Ref Range Status   Specimen  Description EXPECTORATED SPUTUM  Final   Special Requests Normal  Final   Sputum evaluation   Final    THIS SPECIMEN IS ACCEPTABLE FOR SPUTUM CULTURE Performed at [facility], [address], [place], [place] [address]    Report Status 09/25/2023 FINAL  Final  Culture, Respiratory w Gram Stain     Status: None (Preliminary result)   Collection Time: 09/25/23  1:57 PM  Result Value Ref Range Status   Specimen Description   Final    EXPECTORATED SPUTUM Performed at [facility], [address] [place], [place] [address]    Special Requests   Final    Normal Reflexed from [id number] Performed at [facility] [place], [address] [place], [place] [address]    Gram Stain   Final    RARE WBC SEEN RARE GRAM POSITIVE COCCI RARE GRAM NEGATIVE RODS    Culture   Final    CULTURE REINCUBATED FOR BETTER GROWTH Performed at [facility], [address], [place], [place] [address]    Report Status PENDING  Incomplete     Scheduled Meds:  atorvastatin   40 mg Oral QPM   chlordiazePOXIDE   10 mg Oral TID   Followed by   NOREEN ON 09/27/2023] chlordiazePOXIDE   5 mg Oral TID   citalopram   40 mg Oral Daily   fluticasone  furoate-vilanterol  1 puff Inhalation Daily   folic acid   1 mg Oral Daily   ipratropium-albuterol   3 mL Nebulization TID   montelukast   10 mg Oral QHS   multivitamin with minerals  1 tablet Oral Daily   pantoprazole   40 mg Oral Daily   thiamine   100 mg Oral Daily   Or   thiamine   100 mg Intravenous Daily   Continuous Infusions:  azithromycin  Stopped (09/25/23 2220)   cefTRIAXone  (ROCEPHIN )  IV 2 g (09/26/23 1001)    Procedures/Studies: US  ABDOMEN LIMITED WITH LIVER DOPPLER Result Date: 09/24/2023 CLINICAL DATA:  Elevated liver function tests EXAM: DUPLEX ULTRASOUND OF LIVER TECHNIQUE: Color and duplex Doppler ultrasound was performed to evaluate the hepatic in-flow and out-flow vessels. COMPARISON:  CT abdomen pelvis September 23, 2023 FINDINGS: Liver: Increased parenchymal  echogenicity. Non cirrhotic morphology. No focal lesion, mass or intrahepatic biliary ductal dilatation. Main Portal Vein size: 1.4 cm Portal Vein Velocities Main Prox:  69 cm/sec Main Mid: 62 cm/sec Main Dist:  39 cm/sec Right: 30 cm/sec  Left: 48 cm/sec Hepatic Vein Velocities Right:  50 cm/sec Middle:  45 cm/sec Left:  15 cm/sec IVC: Present and patent with normal respiratory phasicity.  80 cm/s Hepatic Artery Velocity:  92 cm/sec Portal Vein Occlusion/Thrombus: No Splenic Vein Occlusion/Thrombus: No Ascites: None Varices: None IMPRESSION: Increased hepatic echogenicity suggestive of hepatic steatosis. Correlate with the function tests. Otherwise normal doppler ultrasound. Electronically Signed   By: Megan  Zare M.D.   On: 09/24/2023 15:55   CT Angio Chest PE W/Cm &/Or Wo Cm Result Date: 09/23/2023 CLINICAL DATA:  Pulmonary embolism (PE) suspected, high prob; Epigastric pain c/o increased sob and not feeling good since Friday. When ems arrived pt o2 sat was in 80s. They placed pt on NRB @12lpm  Epigastric pain EXAM: CT ANGIOGRAPHY CHEST CT ABDOMEN AND PELVIS WITH CONTRAST TECHNIQUE: Multidetector CT imaging of the chest was performed using the standard protocol during bolus administration of intravenous contrast. Multiplanar CT image reconstructions and MIPs were obtained to evaluate the vascular anatomy. Multidetector CT imaging of the abdomen and pelvis was performed using the standard protocol during bolus administration of intravenous contrast. RADIATION DOSE REDUCTION: This exam was performed according to the departmental dose-optimization program which includes automated exposure control, adjustment of the mA and/or kV according to patient size and/or use of iterative reconstruction technique. CONTRAST:  75mL OMNIPAQUE  IOHEXOL  350 MG/ML SOLN COMPARISON:  Chest x-ray 09/23/2023, CT chest 12/12/2022 FINDINGS: CTA CHEST FINDINGS Cardiovascular: Satisfactory opacification of the pulmonary arteries to the  segmental level. No evidence of pulmonary embolism. Normal heart size. No significant pericardial effusion. The thoracic aorta is normal in caliber. No atherosclerotic plaque of the thoracic aorta. No coronary artery calcifications. Mediastinum/Nodes: Several prominent nonenlarged mediastinal lymph nodes with as an example a 1.1 cm precarinal lymph node (2:41). No hilar or axillary lymph nodes. Thyroid  gland, trachea, and esophagus demonstrate no significant findings. Lungs/Pleura: Interval development of diffuse, lower lung zone predominant, peribronchovascular patchy airspace opacities. No pulmonary nodule. No pulmonary mass. No pleural effusion. No pneumothorax. Musculoskeletal: No chest wall abnormality. No suspicious lytic or blastic osseous lesions. No acute displaced fracture. Multilevel degenerative changes of the spine. Review of the MIP images confirms the above findings. CT ABDOMEN and PELVIS FINDINGS Hepatobiliary: No focal liver abnormality. No gallstones, gallbladder wall thickening, or pericholecystic fluid. No biliary dilatation. Pancreas: Diffusely atrophic. No focal lesion. Otherwise normal pancreatic contour. No surrounding inflammatory changes. No main pancreatic ductal dilatation. Spleen: Spleen enlarged measuring up to 17 cm. No focal splenic lesion. Adrenals/Urinary Tract: No adrenal nodule bilaterally. Bilateral kidneys enhance symmetrically. No hydronephrosis. No hydroureter. The urinary bladder is unremarkable. On delayed imaging, there is no urothelial wall thickening and there are no filling defects in the opacified portions of the bilateral collecting systems or ureters. Stomach/Bowel: Stomach is within normal limits. No evidence of bowel wall thickening or dilatation. Appendix appears normal. Vascular/Lymphatic: No abdominal aorta or iliac aneurysm. Mild atherosclerotic plaque of the aorta and its branches. No abdominal, pelvic, or inguinal lymphadenopathy. Reproductive: Prostate is  unremarkable. Other: No intraperitoneal free fluid. No intraperitoneal free gas. No organized fluid collection. Musculoskeletal: No abdominal wall hernia or abnormality. No suspicious lytic or blastic osseous lesions. No acute displaced fracture. Multilevel degenerative changes of the spine. Review of the MIP images confirms the above findings. IMPRESSION: 1. Interval development of diffuse, lower lung zone predominant, peribronchovascular patchy airspace opacities. Findings may represente infection/inflammation (COVID-19 not excluded) versus alveolar hemorrhage. 2. Mediastinal lymphadenopathy. Finding likely reactive in etiology. Recommend attention on follow-up. 3. Splenomegaly. 4.  Aortic Atherosclerosis (ICD10-I70.0). Electronically Signed   By: Morgane  Naveau M.D.   On: 09/23/2023 21:26   CT ABDOMEN PELVIS W CONTRAST Result Date: 09/23/2023 CLINICAL DATA:  Pulmonary embolism (PE) suspected, high prob; Epigastric pain c/o increased sob and not feeling good since Friday. When ems arrived pt o2 sat was in 80s. They placed pt on NRB @12lpm  Epigastric pain EXAM: CT ANGIOGRAPHY CHEST CT ABDOMEN AND PELVIS WITH CONTRAST TECHNIQUE: Multidetector CT imaging of the chest was performed using the standard protocol during bolus administration of intravenous contrast. Multiplanar CT image reconstructions and MIPs were obtained to evaluate the vascular anatomy. Multidetector CT imaging of the abdomen and pelvis was performed using the standard protocol during bolus administration of intravenous contrast. RADIATION DOSE REDUCTION: This exam was performed according to the departmental dose-optimization program which includes automated exposure control, adjustment of the mA and/or kV according to patient size and/or use of iterative reconstruction technique. CONTRAST:  75mL OMNIPAQUE  IOHEXOL  350 MG/ML SOLN COMPARISON:  Chest x-ray 09/23/2023, CT chest 12/12/2022 FINDINGS: CTA CHEST FINDINGS Cardiovascular: Satisfactory  opacification of the pulmonary arteries to the segmental level. No evidence of pulmonary embolism. Normal heart size. No significant pericardial effusion. The thoracic aorta is normal in caliber. No atherosclerotic plaque of the thoracic aorta. No coronary artery calcifications. Mediastinum/Nodes: Several prominent nonenlarged mediastinal lymph nodes with as an example a 1.1 cm precarinal lymph node (2:41). No hilar or axillary lymph nodes. Thyroid  gland, trachea, and esophagus demonstrate no significant findings. Lungs/Pleura: Interval development of diffuse, lower lung zone predominant, peribronchovascular patchy airspace opacities. No pulmonary nodule. No pulmonary mass. No pleural effusion. No pneumothorax. Musculoskeletal: No chest wall abnormality. No suspicious lytic or blastic osseous lesions. No acute displaced fracture. Multilevel degenerative changes of the spine. Review of the MIP images confirms the above findings. CT ABDOMEN and PELVIS FINDINGS Hepatobiliary: No focal liver abnormality. No gallstones, gallbladder wall thickening, or pericholecystic fluid. No biliary dilatation. Pancreas: Diffusely atrophic. No focal lesion. Otherwise normal pancreatic contour. No surrounding inflammatory changes. No main pancreatic ductal dilatation. Spleen: Spleen enlarged measuring up to 17 cm. No focal splenic lesion. Adrenals/Urinary Tract: No adrenal nodule bilaterally. Bilateral kidneys enhance symmetrically. No hydronephrosis. No hydroureter. The urinary bladder is unremarkable. On delayed imaging, there is no urothelial wall thickening and there are no filling defects in the opacified portions of the bilateral collecting systems or ureters. Stomach/Bowel: Stomach is within normal limits. No evidence of bowel wall thickening or dilatation. Appendix appears normal. Vascular/Lymphatic: No abdominal aorta or iliac aneurysm. Mild atherosclerotic plaque of the aorta and its branches. No abdominal, pelvic, or inguinal  lymphadenopathy. Reproductive: Prostate is unremarkable. Other: No intraperitoneal free fluid. No intraperitoneal free gas. No organized fluid collection. Musculoskeletal: No abdominal wall hernia or abnormality. No suspicious lytic or blastic osseous lesions. No acute displaced fracture. Multilevel degenerative changes of the spine. Review of the MIP images confirms the above findings. IMPRESSION: 1. Interval development of diffuse, lower lung zone predominant, peribronchovascular patchy airspace opacities. Findings may represente infection/inflammation (COVID-19 not excluded) versus alveolar hemorrhage. 2. Mediastinal lymphadenopathy. Finding likely reactive in etiology. Recommend attention on follow-up. 3. Splenomegaly. 4.  Aortic Atherosclerosis (ICD10-I70.0). Electronically Signed   By: Morgane  Naveau M.D.   On: 09/23/2023 21:26   DG Chest Port 1 View Result Date: 09/23/2023 EXAM: 1 VIEW XRAY OF THE CHEST 09/23/2023 08:23:00 PM COMPARISON: CT chest dated 09/21/2023. CLINICAL HISTORY: Questionable sepsis - evaluate for abnormality. Per triage SOB and not feeling good since Friday. When EMS arrived pt  O2 sat was in 80s. FINDINGS: LUNGS AND PLEURA: Mild patchy interstitial/airspace opacities, lower lungs predominant, favoring interstitial/alveolar edema over pneumonia given rapid change. No pneumothorax. HEART AND MEDIASTINUM: Cardiomegaly. BONES AND SOFT TISSUES: No acute osseous abnormality. IMPRESSION: 1. Mild patchy interstitial/airspace opacities, favoring interstitial/alveolar edema over pneumonia. 2. Cardiomegaly. Electronically signed by: Pinkie Pebbles MD 09/23/2023 08:26 PM EDT RP Workstation: [id number]   CT CHEST HIGH RESOLUTION Result Date: 09/22/2023 CLINICAL DATA:  Pulmonary infiltrates, chronic cough, shortness of breath nonsmoker EXAM: CT CHEST WITHOUT CONTRAST TECHNIQUE: Multidetector CT imaging of the chest was performed following the standard protocol without intravenous contrast. High  resolution imaging of the lungs, as well as inspiratory and expiratory imaging, was performed. RADIATION DOSE REDUCTION: This exam was performed according to the departmental dose-optimization program which includes automated exposure control, adjustment of the mA and/or kV according to patient size and/or use of iterative reconstruction technique. COMPARISON:  01/08/2023 FINDINGS: Cardiovascular: Scattered aortic atherosclerosis. Normal heart size. Enlargement of the main pulmonary artery measuring up to 3.9 cm in caliber. No pericardial effusion. Mediastinum/Nodes: No enlarged mediastinal, hilar, or axillary lymph nodes. Thyroid  gland, trachea, and esophagus demonstrate no significant findings. Lungs/Pleura: Mild diffuse bilateral bronchial wall thickening. Interval resolution of the majority of previously seen ground-glass airspace opacities throughout the chest however there is new irregular ground-glass airspace opacity scattered in the lung bases, particularly in the medial right lower lobe (series 7, image 119). No significant air trapping on expiratory phase imaging no pleural effusion or pneumothorax. Upper Abdomen: No acute abnormality.  Hepatic steatosis. Musculoskeletal: No chest wall abnormality. No acute osseous findings. IMPRESSION: 1. Interval resolution of the majority of previously seen ground-glass airspace opacities throughout the chest however there is new irregular ground-glass airspace opacity scattered in the lung bases, particularly in the medial right lower lobe. Fluctuant findings are consistent with ongoing infection or inflammation. No evidence of fibrotic interstitial lung disease. 2. Mild diffuse bilateral bronchial wall thickening, consistent with nonspecific infectious or inflammatory bronchitis. 3. Enlargement of the main pulmonary artery, as can be seen in pulmonary hypertension. 4. Hepatic steatosis. Aortic Atherosclerosis (ICD10-I70.0). Electronically Signed   By: Marolyn JONETTA Jaksch  M.D.   On: 09/22/2023 20:37   US  Abdomen Limited RUQ (LIVER/GB) Result Date: 08/31/2023 CLINICAL DATA:  Elevated liver function tests. History of fatty liver. EXAM: ULTRASOUND ABDOMEN LIMITED RIGHT UPPER QUADRANT COMPARISON:  Ultrasound 02/28/2022 FINDINGS: Gallbladder: Dilated gallbladder. No shadowing stones. No wall thickening or adjacent fluid. Common bile duct: Diameter: 2 mm Liver: Diffusely echogenic hepatic parenchyma consistent with fatty liver infiltration. Small hypoechoic area in the liver adjacent to the gallbladder fossa could be an area of fatty sparing. Portal vein is patent on color Doppler imaging with normal direction of blood flow towards the liver. Other: None. IMPRESSION: Fatty liver infiltration. Presumed area of fatty sparing next of the dilated gallbladder. No shadowing stones or ductal dilatation. Electronically Signed   By: Ranell Bring M.D.   On: 08/31/2023 09:49    Alm Schneider, DO  [facility]  If 7PM-7AM, please contact night-coverage [email or web address] Password [facility] 09/26/2023, 4:48 PM   LOS: 3 days

## 2023-09-27 ENCOUNTER — Other Ambulatory Visit (HOSPITAL_COMMUNITY)

## 2023-09-27 ENCOUNTER — Other Ambulatory Visit (HOSPITAL_COMMUNITY): Payer: Self-pay | Admitting: *Deleted

## 2023-09-27 DIAGNOSIS — J9601 Acute respiratory failure with hypoxia: Secondary | ICD-10-CM | POA: Diagnosis not present

## 2023-09-27 DIAGNOSIS — J181 Lobar pneumonia, unspecified organism: Secondary | ICD-10-CM | POA: Diagnosis not present

## 2023-09-27 DIAGNOSIS — J4551 Severe persistent asthma with (acute) exacerbation: Secondary | ICD-10-CM | POA: Diagnosis not present

## 2023-09-27 LAB — COMPREHENSIVE METABOLIC PANEL WITH GFR
ALT: 89 U/L — ABNORMAL HIGH (ref 0–44)
AST: 172 U/L — ABNORMAL HIGH (ref 15–41)
Albumin: 2.4 g/dL — ABNORMAL LOW (ref 3.5–5.0)
Alkaline Phosphatase: 214 U/L — ABNORMAL HIGH (ref 38–126)
Anion gap: 7 (ref 5–15)
BUN: 19 mg/dL (ref 6–20)
CO2: 19 mmol/L — ABNORMAL LOW (ref 22–32)
Calcium: 8.4 mg/dL — ABNORMAL LOW (ref 8.9–10.3)
Chloride: 106 mmol/L (ref 98–111)
Creatinine, Ser: 0.76 mg/dL (ref 0.61–1.24)
GFR, Estimated: >60 mL/min (ref >60)
Glucose, Bld: 167 mg/dL — ABNORMAL HIGH (ref 70–99)
Potassium: 4.3 mmol/L (ref 3.5–5.1)
Sodium: 132 mmol/L — ABNORMAL LOW (ref 135–145)
Total Bilirubin: 2.8 mg/dL — ABNORMAL HIGH (ref 0.0–1.2)
Total Protein: 6.8 g/dL (ref 6.5–8.1)

## 2023-09-27 LAB — FOLATE: Folate: 7.3 ng/mL (ref >5.9)

## 2023-09-27 LAB — VITAMIN B12: Vitamin B-12: 1978 pg/mL — ABNORMAL HIGH (ref 180–914)

## 2023-09-27 LAB — T4, FREE: Free T4: 1.08 ng/dL (ref 0.61–1.12)

## 2023-09-27 LAB — RESPIRATORY PANEL BY PCR

## 2023-09-27 LAB — BLOOD GAS, VENOUS
Acid-base deficit: 1.1 mmol/L (ref 0.0–2.0)
Bicarbonate: 23.5 mmol/L (ref 20.0–28.0)
Drawn by: 53361
O2 Saturation: 94.8 %
Patient temperature: 36.8
pCO2, Ven: 38 mmHg — ABNORMAL LOW (ref 44–60)
pH, Ven: 7.4 (ref 7.25–7.43)
pO2, Ven: 67 mmHg — ABNORMAL HIGH (ref 32–45)

## 2023-09-27 LAB — BRAIN NATRIURETIC PEPTIDE: B Natriuretic Peptide: 578 pg/mL — ABNORMAL HIGH (ref 0.0–100.0)

## 2023-09-27 LAB — CULTURE, RESPIRATORY W GRAM STAIN
Culture: NORMAL
Special Requests: NORMAL

## 2023-09-27 LAB — TSH: TSH: 0.792 u[IU]/mL (ref 0.350–4.500)

## 2023-09-27 MED ORDER — HYDROCODONE BIT-HOMATROP MBR 5-1.5 MG/5ML PO SOLN
5.0000 mL | ORAL | Status: DC | PRN
  Administered 2023-09-27 – 2023-09-29 (×3): 5 mL via ORAL
  Filled 2023-09-27 (×3): qty 5

## 2023-09-27 MED ORDER — SODIUM CHLORIDE 0.9 % IV SOLN
2.0000 g | INTRAVENOUS | Status: DC
  Administered 2023-09-28: 2 g via INTRAVENOUS
  Filled 2023-09-27 (×2): qty 20

## 2023-09-27 MED ORDER — SODIUM CHLORIDE 0.9 % IV SOLN
500.0000 mg | INTRAVENOUS | Status: AC
  Administered 2023-09-27 – 2023-09-28 (×2): 500 mg via INTRAVENOUS
  Filled 2023-09-27 (×2): qty 5

## 2023-09-27 MED ORDER — FUROSEMIDE 10 MG/ML IJ SOLN
20.0000 mg | Freq: Once | INTRAMUSCULAR | Status: AC
  Administered 2023-09-27: 20 mg via INTRAVENOUS
  Filled 2023-09-27: qty 2

## 2023-09-27 NOTE — Plan of Care (Signed)

## 2023-09-27 NOTE — Progress Notes (Signed)
 PROGRESS NOTE  Ryan Holland FMW:[id number] DOB: 1970/05/21 DOA: 09/23/2023 PCP: Dorina Loving, [name]  Brief History:  53 y.o. male with medical history significant for OSA on CPAP, obesity, asthma, alcohol  use disorder, who presents to the ER due to shortness of breath for the past 4 days.  Associated with a persistent cough.  Endorses subjective fevers and intermittent chills.   In the ER, febrile, hypoxic requiring 4 L nasal cannula to maintain O2 saturation above 92%, tachycardic, tachypneic.  CT angio chest was negative for acute pulmonary embolism however showed interval development of diffuse, lower lung zone predominant, peribronchovascular patchy airspace opacities.  Findings may represent infection/inflammation versus alveolar hemorrhage.   Additionally, the patient had a CT abdomen and pelvis with contrast which revealed mediastinal lymphadenopathy, likely reactive in etiology.  Splenomegaly.   Lab work was notable for elevated liver chemistries and T. bili of 6.9.  Admits to excessive alcohol  use.   Due to concern for possible multifocal CAP, the patient was started on Rocephin  and IV azithromycin .  He also received IV Solu-Medrol , and bronchodilator nebulizers in the ER.  Admitted by [facility], hospitalist service.     Assessment/Plan: Acute hypoxic respiratory failure secondary to Lobar pneumonia and asthma exacerbation   -Personally reviewed CT chest which showed bilateral lower lobe infiltrates/patchy opacities  -Currently requiring 4 L to maintain O2 saturation above 92% -wean oxygen for saturation >92% - PCT 0.88 -Continue empiric IV ceftiraxone and azithro -Continue IV Solu-Medrol  -added brovana  and pulmicort  -Incentive spirometer -add loratidine -viral respiratory panel+metapneumovirus   Acute asthma exacerbation -continue BDs as above -Continue Rocephin  and IV azithromycin  -Continue IV Solu-Medrol  -add hycodan   Alcohol  abuse with concern for alcohol   withdrawal -CIWA protocol, added librium  taper as he reported 3 liters of alcohol  per day (vodka)  -Multivitamins, thiamine , folic acid  supplement Interested in alcohol  cessation - consult to [facility]    Hepatic Steatosis  Alcoholic hepatitis -viral hepatitis panel neg -Avoid hepatotoxic agents. -Repeat CMP -Pt advised that he will need to continue to follow up with GI outpatient    Hyperbilirubinemia, jaundice -CT abdomen pelvis with contrast was unrevealing, no biliary dilatation, no gallstone no focal liver abnormality -due to cholestasis -bili trending down -liver doppler--steatosis, no portal venous thrombus   Splenomegaly -Spleen measuring up to 17 cm.  No focal splenic lesion   Thrombocytopenia, mild -Platelet count 128 in the setting of alcohol  abuse/splenomegaly -viral resp panel +metapneumovirus -B12--1978 -folate--7.3>>replete   Non anion gap metabolic acidosis Serum bicarb 19, anion gap 14 Received LR fluid in ED -improved   Elevated troponin, suspect demand ischemia in the setting of hypoxia High-sensitivity troponin peaked at 20>>16 No evidence of acute ischemia on twelve-lead EKG -obtain echo   Class III Obesity BMI 40.46 -lifestyle modification   Dyslipidemia -- LDL 128, HDL 13, TC 234 -- atorvastatin  40 mg every evening    OSA CPAP nightly Suspect he may have undiagnosed OHS   Hyponatremia -- sodium improved    Low cortisol -collected after pt was given IV steroids -dubious clinical significance   Family Communication:   spouse 7/23   Consultants:  none   Code Status:  FULL   DVT Prophylaxis:  SCDs     Procedures: As Listed in Progress Note Above   Antibiotics: Ceftriaxone  7/20>> Azithro 7/20>>          Subjective: Pt states his breathing is a little better.  Denies cp, n/v/d.  Has dry cough.  No hemoptysis, abd pain   Objective: Vitals:   09/26/23 2030 09/27/23 0453 09/27/23 0807 09/27/23 1231  BP: 124/85 119/87  (!)  128/109  Pulse: 87 64  90  Resp:    20  Temp: 98.2 F (36.8 C) (!) 97.4 F (36.3 C)  (!) 97.5 F (36.4 C)  TempSrc: Oral Axillary  Oral  SpO2: 93% 92% 92% 96%  Weight:      Height:        Intake/Output Summary (Last 24 hours) at 09/27/2023 1746 Last data filed at 09/27/2023 1300 Gross per 24 hour  Intake 830 ml  Output 2600 ml  Net -1770 ml   Weight change:  Exam:  General:  Pt is alert, follows commands appropriately, not in acute distress HEENT: No icterus, No thrush, No neck mass, [place]/AT Cardiovascular: RRR, S1/S2, no rubs, no gallops Respiratory: bibasilar rales.  Diminished BS.  No wheeze Abdomen: Soft/+BS, non tender, non distended, no guarding Extremities: No edema, No lymphangitis, No petechiae, No rashes, no synovitis   Data Reviewed: I have personally reviewed following labs and imaging studies Basic Metabolic Panel: Recent Labs  Lab 09/23/23 2005 09/24/23 0227 09/25/23 0439 09/26/23 0423 09/27/23 0443  NA 129* 128* 134* 136 132*  K 4.1 3.9 4.1 3.9 4.3  CL 96* 95* 102 104 106  CO2 19* 19* 23 22 19*  GLUCOSE 93 126* 141* 115* 167*  BUN 14 13 17 20 19   CREATININE 0.75 0.79 0.80 0.65 0.76  CALCIUM  8.7* 8.4* 8.7* 8.4* 8.4*  MG  --  1.4* 2.4  --   --   PHOS  --  2.2*  --   --   --    Liver Function Tests: Recent Labs  Lab 09/23/23 2005 09/24/23 0227 09/25/23 0439 09/26/23 0423 09/27/23 0443  AST 242* 228* 182* 164* 172*  ALT 66* 60* 70* 71* 89*  ALKPHOS 195* 189* 212* 187* 214*  BILITOT 6.9* 6.6* 3.9* 2.6* 2.8*  PROT 7.6 7.2 7.5 6.4* 6.8  ALBUMIN 2.8* 2.7* 2.7* 2.3* 2.4*   No results for input(s): LIPASE, AMYLASE in the last 168 hours. No results for input(s): AMMONIA in the last 168 hours. Coagulation Profile: Recent Labs  Lab 09/23/23 2005 09/25/23 0907  INR 1.1 1.2   CBC: Recent Labs  Lab 09/23/23 2005 09/24/23 0227  WBC 7.5 6.6  NEUTROABS 5.7 5.6  HGB 14.3 13.1  HCT 40.7 38.3*  MCV 104.4* 105.5*  PLT 128* 118*    Cardiac Enzymes: No results for input(s): CKTOTAL, CKMB, CKMBINDEX, TROPONINI in the last 168 hours. BNP: Invalid input(s): POCBNP CBG: No results for input(s): GLUCAP in the last 168 hours. HbA1C: No results for input(s): HGBA1C in the last 72 hours. Urine analysis:    Component Value Date/Time   COLORURINE AMBER (A) 09/24/2023 0300   APPEARANCEUR CLEAR 09/24/2023 0300   LABSPEC >1.046 (H) 09/24/2023 0300   PHURINE 6.0 09/24/2023 0300   GLUCOSEU NEGATIVE 09/24/2023 0300   HGBUR NEGATIVE 09/24/2023 0300   BILIRUBINUR SMALL (A) 09/24/2023 0300   BILIRUBINUR negative 03/17/2021 1015   KETONESUR NEGATIVE 09/24/2023 0300   PROTEINUR NEGATIVE 09/24/2023 0300   UROBILINOGEN 1.0 03/17/2021 1015   UROBILINOGEN 0.2 07/24/2011 1033   NITRITE NEGATIVE 09/24/2023 0300   LEUKOCYTESUR NEGATIVE 09/24/2023 0300   Sepsis Labs: @LABRCNTIP (procalcitonin:4,lacticidven:4) ) Recent Results (from the past 240 hours)  Blood Culture (routine x 2)     Status: None (Preliminary result)   Collection Time: 09/23/23  8:05 PM   Specimen: Left  Antecubital; Blood  Result Value Ref Range Status   Specimen Description LEFT ANTECUBITAL  Final   Special Requests   Final    BOTTLES DRAWN AEROBIC AND ANAEROBIC Blood Culture adequate volume   Culture   Final    NO GROWTH 4 DAYS Performed at [facility], [address], [place], [place] [address]    Report Status PENDING  Incomplete  Blood Culture (routine x 2)     Status: None (Preliminary result)   Collection Time: 09/23/23  8:20 PM   Specimen: BLOOD LEFT HAND  Result Value Ref Range Status   Specimen Description BLOOD LEFT HAND  Final   Special Requests   Final    BOTTLES DRAWN AEROBIC AND ANAEROBIC Blood Culture adequate volume   Culture   Final    NO GROWTH 4 DAYS Performed at [facility], [address], [place], [place] [address]    Report Status PENDING  Incomplete  Resp panel by RT-PCR (RSV, Flu A&B, Covid) Anterior Nasal Swab      Status: None   Collection Time: 09/23/23  8:49 PM   Specimen: Anterior Nasal Swab  Result Value Ref Range Status   SARS Coronavirus 2 by RT PCR NEGATIVE NEGATIVE Final    Comment: (NOTE) SARS-CoV-2 target nucleic acids are NOT DETECTED.  The SARS-CoV-2 RNA is generally detectable in upper respiratory specimens during the acute phase of infection. The lowest concentration of SARS-CoV-2 viral copies this assay can detect is 138 copies/mL. A negative result does not preclude SARS-Cov-2 infection and should not be used as the sole basis for treatment or other patient management decisions. A negative result may occur with  improper specimen collection/handling, submission of specimen other than nasopharyngeal swab, presence of viral mutation(s) within the areas targeted by this assay, and inadequate number of viral copies(<138 copies/mL). A negative result must be combined with clinical observations, patient history, and epidemiological information. The expected result is Negative.  Fact Sheet for Patients:  BloggerCourse.com  Fact Sheet for Healthcare Providers:  SeriousBroker.it  This test is no t yet approved or cleared by the [place]  FDA and  has been authorized for detection and/or diagnosis of SARS-CoV-2 by FDA under an Emergency Use Authorization (EUA). This EUA will remain  in effect (meaning this test can be used) for the duration of the COVID-19 declaration under Section 564(b)(1) of the Act, 21 U.S.C.section 360bbb-3(b)(1), unless the authorization is terminated  or revoked sooner.       Influenza A by PCR NEGATIVE NEGATIVE Final   Influenza B by PCR NEGATIVE NEGATIVE Final    Comment: (NOTE) The Xpert Xpress SARS-CoV-2/FLU/RSV plus assay is intended as an aid in the diagnosis of influenza from Nasopharyngeal swab specimens and should not be used as a sole basis for treatment. Nasal washings and aspirates are  unacceptable for Xpert Xpress SARS-CoV-2/FLU/RSV testing.  Fact Sheet for Patients: BloggerCourse.com  Fact Sheet for Healthcare Providers: SeriousBroker.it  This test is not yet approved or cleared by the [place]  FDA and has been authorized for detection and/or diagnosis of SARS-CoV-2 by FDA under an Emergency Use Authorization (EUA). This EUA will remain in effect (meaning this test can be used) for the duration of the COVID-19 declaration under Section 564(b)(1) of the Act, 21 U.S.C. section 360bbb-3(b)(1), unless the authorization is terminated or revoked.     Resp Syncytial Virus by PCR NEGATIVE NEGATIVE Final    Comment: (NOTE) Fact Sheet for Patients: BloggerCourse.com  Fact Sheet for Healthcare Providers: SeriousBroker.it  This test is not yet approved or cleared by the [place]  FDA and has been authorized for detection and/or diagnosis of SARS-CoV-2 by FDA under an Emergency Use Authorization (EUA). This EUA will remain in effect (meaning this test can be used) for the duration of the COVID-19 declaration under Section 564(b)(1) of the Act, 21 U.S.C. section 360bbb-3(b)(1), unless the authorization is terminated or revoked.  Performed at [facility], [address] [place], [place] [address]   MRSA Next Gen by PCR, Nasal     Status: None   Collection Time: 09/24/23  1:04 AM   Specimen: Nasal Mucosa; Nasal Swab  Result Value Ref Range Status   MRSA by PCR Next Gen NOT DETECTED NOT DETECTED Final    Comment: (NOTE) The GeneXpert MRSA Assay (FDA approved for NASAL specimens only), is one component of a comprehensive MRSA colonization surveillance program. It is not intended to diagnose MRSA infection nor to guide or monitor treatment for MRSA infections. Test performance is not FDA approved in patients less than 72 years old. Performed at [facility], [address], [place], [place] [address]   Expectorated Sputum Assessment w Gram Stain, Rflx to Resp Cult     Status: None   Collection Time: 09/25/23  1:57 PM   Specimen: Expectorated Sputum  Result Value Ref Range Status   Specimen Description EXPECTORATED SPUTUM  Final   Special Requests Normal  Final   Sputum evaluation   Final    THIS SPECIMEN IS ACCEPTABLE FOR SPUTUM CULTURE Performed at [facility], [address], [place], [place] [address]    Report Status 09/25/2023 FINAL  Final  Culture, Respiratory w Gram Stain     Status: None   Collection Time: 09/25/23  1:57 PM  Result Value Ref Range Status   Specimen Description   Final    EXPECTORATED SPUTUM Performed at [facility], [address] [place], [place] [address]    Special Requests   Final    Normal Reflexed from [id number] Performed at [facility], [address], [place], [place] [address]    Gram Stain   Final    RARE WBC SEEN RARE GRAM POSITIVE COCCI RARE GRAM NEGATIVE RODS    Culture   Final    Normal respiratory flora-no Staph aureus or Pseudomonas seen Performed at [facility], [address] [place], [place] [address]    Report Status 09/27/2023 FINAL  Final  Respiratory (~20 pathogens) panel by PCR     Status: Abnormal   Collection Time: 09/26/23  5:55 PM   Specimen: Nasopharyngeal Swab; Respiratory  Result Value Ref Range Status   Adenovirus NOT DETECTED NOT DETECTED Final   Coronavirus 229E NOT DETECTED NOT DETECTED Final    Comment: (NOTE) The Coronavirus on the Respiratory Panel, DOES NOT test for the novel  Coronavirus (2019 nCoV)    Coronavirus HKU1 NOT DETECTED NOT DETECTED Final   Coronavirus NL63 NOT DETECTED NOT DETECTED Final   Coronavirus OC43 NOT DETECTED NOT DETECTED Final   Metapneumovirus DETECTED (A) NOT DETECTED Final   Rhinovirus / Enterovirus NOT DETECTED NOT DETECTED Final   Influenza A NOT DETECTED NOT DETECTED Final   Influenza B NOT DETECTED NOT DETECTED Final    Parainfluenza Virus 1 NOT DETECTED NOT DETECTED Final   Parainfluenza Virus 2 NOT DETECTED NOT DETECTED Final   Parainfluenza Virus 3 NOT DETECTED NOT DETECTED Final   Parainfluenza Virus 4 NOT DETECTED NOT DETECTED Final   Respiratory Syncytial Virus  NOT DETECTED NOT DETECTED Final   Bordetella pertussis NOT DETECTED NOT DETECTED Final   Bordetella Parapertussis NOT DETECTED NOT DETECTED Final   Chlamydophila pneumoniae NOT DETECTED NOT DETECTED Final   Mycoplasma pneumoniae NOT DETECTED NOT DETECTED Final    Comment: Performed at [facility], [address] [place], [place] [address]     Scheduled Meds:  arformoterol   15 mcg Nebulization BID   atorvastatin   40 mg Oral QPM   budesonide  (PULMICORT ) nebulizer solution  0.5 mg Nebulization BID   chlordiazePOXIDE   5 mg Oral TID   citalopram   40 mg Oral Daily   folic acid   1 mg Oral Daily   ipratropium-albuterol   3 mL Nebulization TID   loratadine   10 mg Oral Daily   methylPREDNISolone  (SOLU-MEDROL ) injection  60 mg Intravenous Q12H   montelukast   10 mg Oral QHS   multivitamin with minerals  1 tablet Oral Daily   pantoprazole   40 mg Oral Daily   thiamine   100 mg Oral Daily   Or   thiamine   100 mg Intravenous Daily   Continuous Infusions:  azithromycin      [START ON 09/28/2023] cefTRIAXone  (ROCEPHIN )  IV      Procedures/Studies: US  ABDOMEN LIMITED WITH LIVER DOPPLER Result Date: 09/24/2023 CLINICAL DATA:  Elevated liver function tests EXAM: DUPLEX ULTRASOUND OF LIVER TECHNIQUE: Color and duplex Doppler ultrasound was performed to evaluate the hepatic in-flow and out-flow vessels. COMPARISON:  CT abdomen pelvis September 23, 2023 FINDINGS: Liver: Increased parenchymal echogenicity. Non cirrhotic morphology. No focal lesion, mass or intrahepatic biliary ductal dilatation. Main Portal Vein size: 1.4 cm Portal Vein Velocities Main Prox:  69 cm/sec Main Mid: 62 cm/sec Main Dist:  39 cm/sec Right: 30 cm/sec Left: 48 cm/sec Hepatic Vein  Velocities Right:  50 cm/sec Middle:  45 cm/sec Left:  15 cm/sec IVC: Present and patent with normal respiratory phasicity.  80 cm/s Hepatic Artery Velocity:  92 cm/sec Portal Vein Occlusion/Thrombus: No Splenic Vein Occlusion/Thrombus: No Ascites: None Varices: None IMPRESSION: Increased hepatic echogenicity suggestive of hepatic steatosis. Correlate with the function tests. Otherwise normal doppler ultrasound. Electronically Signed   By: Megan  Zare M.D.   On: 09/24/2023 15:55   CT Angio Chest PE W/Cm &/Or Wo Cm Result Date: 09/23/2023 CLINICAL DATA:  Pulmonary embolism (PE) suspected, high prob; Epigastric pain c/o increased sob and not feeling good since Friday. When ems arrived pt o2 sat was in 80s. They placed pt on NRB @12lpm  Epigastric pain EXAM: CT ANGIOGRAPHY CHEST CT ABDOMEN AND PELVIS WITH CONTRAST TECHNIQUE: Multidetector CT imaging of the chest was performed using the standard protocol during bolus administration of intravenous contrast. Multiplanar CT image reconstructions and MIPs were obtained to evaluate the vascular anatomy. Multidetector CT imaging of the abdomen and pelvis was performed using the standard protocol during bolus administration of intravenous contrast. RADIATION DOSE REDUCTION: This exam was performed according to the departmental dose-optimization program which includes automated exposure control, adjustment of the mA and/or kV according to patient size and/or use of iterative reconstruction technique. CONTRAST:  75mL OMNIPAQUE  IOHEXOL  350 MG/ML SOLN COMPARISON:  Chest x-ray 09/23/2023, CT chest 12/12/2022 FINDINGS: CTA CHEST FINDINGS Cardiovascular: Satisfactory opacification of the pulmonary arteries to the segmental level. No evidence of pulmonary embolism. Normal heart size. No significant pericardial effusion. The thoracic aorta is normal in caliber. No atherosclerotic plaque of the thoracic aorta. No coronary artery calcifications. Mediastinum/Nodes: Several prominent  nonenlarged mediastinal lymph nodes with as an example a 1.1 cm precarinal lymph  node (2:41). No hilar or axillary lymph nodes. Thyroid  gland, trachea, and esophagus demonstrate no significant findings. Lungs/Pleura: Interval development of diffuse, lower lung zone predominant, peribronchovascular patchy airspace opacities. No pulmonary nodule. No pulmonary mass. No pleural effusion. No pneumothorax. Musculoskeletal: No chest wall abnormality. No suspicious lytic or blastic osseous lesions. No acute displaced fracture. Multilevel degenerative changes of the spine. Review of the MIP images confirms the above findings. CT ABDOMEN and PELVIS FINDINGS Hepatobiliary: No focal liver abnormality. No gallstones, gallbladder wall thickening, or pericholecystic fluid. No biliary dilatation. Pancreas: Diffusely atrophic. No focal lesion. Otherwise normal pancreatic contour. No surrounding inflammatory changes. No main pancreatic ductal dilatation. Spleen: Spleen enlarged measuring up to 17 cm. No focal splenic lesion. Adrenals/Urinary Tract: No adrenal nodule bilaterally. Bilateral kidneys enhance symmetrically. No hydronephrosis. No hydroureter. The urinary bladder is unremarkable. On delayed imaging, there is no urothelial wall thickening and there are no filling defects in the opacified portions of the bilateral collecting systems or ureters. Stomach/Bowel: Stomach is within normal limits. No evidence of bowel wall thickening or dilatation. Appendix appears normal. Vascular/Lymphatic: No abdominal aorta or iliac aneurysm. Mild atherosclerotic plaque of the aorta and its branches. No abdominal, pelvic, or inguinal lymphadenopathy. Reproductive: Prostate is unremarkable. Other: No intraperitoneal free fluid. No intraperitoneal free gas. No organized fluid collection. Musculoskeletal: No abdominal wall hernia or abnormality. No suspicious lytic or blastic osseous lesions. No acute displaced fracture. Multilevel degenerative  changes of the spine. Review of the MIP images confirms the above findings. IMPRESSION: 1. Interval development of diffuse, lower lung zone predominant, peribronchovascular patchy airspace opacities. Findings may represente infection/inflammation (COVID-19 not excluded) versus alveolar hemorrhage. 2. Mediastinal lymphadenopathy. Finding likely reactive in etiology. Recommend attention on follow-up. 3. Splenomegaly. 4.  Aortic Atherosclerosis (ICD10-I70.0). Electronically Signed   By: Morgane  Naveau M.D.   On: 09/23/2023 21:26   CT ABDOMEN PELVIS W CONTRAST Result Date: 09/23/2023 CLINICAL DATA:  Pulmonary embolism (PE) suspected, high prob; Epigastric pain c/o increased sob and not feeling good since Friday. When ems arrived pt o2 sat was in 80s. They placed pt on NRB @12lpm  Epigastric pain EXAM: CT ANGIOGRAPHY CHEST CT ABDOMEN AND PELVIS WITH CONTRAST TECHNIQUE: Multidetector CT imaging of the chest was performed using the standard protocol during bolus administration of intravenous contrast. Multiplanar CT image reconstructions and MIPs were obtained to evaluate the vascular anatomy. Multidetector CT imaging of the abdomen and pelvis was performed using the standard protocol during bolus administration of intravenous contrast. RADIATION DOSE REDUCTION: This exam was performed according to the departmental dose-optimization program which includes automated exposure control, adjustment of the mA and/or kV according to patient size and/or use of iterative reconstruction technique. CONTRAST:  75mL OMNIPAQUE  IOHEXOL  350 MG/ML SOLN COMPARISON:  Chest x-ray 09/23/2023, CT chest 12/12/2022 FINDINGS: CTA CHEST FINDINGS Cardiovascular: Satisfactory opacification of the pulmonary arteries to the segmental level. No evidence of pulmonary embolism. Normal heart size. No significant pericardial effusion. The thoracic aorta is normal in caliber. No atherosclerotic plaque of the thoracic aorta. No coronary artery  calcifications. Mediastinum/Nodes: Several prominent nonenlarged mediastinal lymph nodes with as an example a 1.1 cm precarinal lymph node (2:41). No hilar or axillary lymph nodes. Thyroid  gland, trachea, and esophagus demonstrate no significant findings. Lungs/Pleura: Interval development of diffuse, lower lung zone predominant, peribronchovascular patchy airspace opacities. No pulmonary nodule. No pulmonary mass. No pleural effusion. No pneumothorax. Musculoskeletal: No chest wall abnormality. No suspicious lytic or blastic osseous lesions. No acute displaced fracture. Multilevel degenerative changes of the spine.  Review of the MIP images confirms the above findings. CT ABDOMEN and PELVIS FINDINGS Hepatobiliary: No focal liver abnormality. No gallstones, gallbladder wall thickening, or pericholecystic fluid. No biliary dilatation. Pancreas: Diffusely atrophic. No focal lesion. Otherwise normal pancreatic contour. No surrounding inflammatory changes. No main pancreatic ductal dilatation. Spleen: Spleen enlarged measuring up to 17 cm. No focal splenic lesion. Adrenals/Urinary Tract: No adrenal nodule bilaterally. Bilateral kidneys enhance symmetrically. No hydronephrosis. No hydroureter. The urinary bladder is unremarkable. On delayed imaging, there is no urothelial wall thickening and there are no filling defects in the opacified portions of the bilateral collecting systems or ureters. Stomach/Bowel: Stomach is within normal limits. No evidence of bowel wall thickening or dilatation. Appendix appears normal. Vascular/Lymphatic: No abdominal aorta or iliac aneurysm. Mild atherosclerotic plaque of the aorta and its branches. No abdominal, pelvic, or inguinal lymphadenopathy. Reproductive: Prostate is unremarkable. Other: No intraperitoneal free fluid. No intraperitoneal free gas. No organized fluid collection. Musculoskeletal: No abdominal wall hernia or abnormality. No suspicious lytic or blastic osseous lesions. No  acute displaced fracture. Multilevel degenerative changes of the spine. Review of the MIP images confirms the above findings. IMPRESSION: 1. Interval development of diffuse, lower lung zone predominant, peribronchovascular patchy airspace opacities. Findings may represente infection/inflammation (COVID-19 not excluded) versus alveolar hemorrhage. 2. Mediastinal lymphadenopathy. Finding likely reactive in etiology. Recommend attention on follow-up. 3. Splenomegaly. 4.  Aortic Atherosclerosis (ICD10-I70.0). Electronically Signed   By: Morgane  Naveau M.D.   On: 09/23/2023 21:26   DG Chest Port 1 View Result Date: 09/23/2023 EXAM: 1 VIEW XRAY OF THE CHEST 09/23/2023 08:23:00 PM COMPARISON: CT chest dated 09/21/2023. CLINICAL HISTORY: Questionable sepsis - evaluate for abnormality. Per triage SOB and not feeling good since Friday. When EMS arrived pt O2 sat was in 80s. FINDINGS: LUNGS AND PLEURA: Mild patchy interstitial/airspace opacities, lower lungs predominant, favoring interstitial/alveolar edema over pneumonia given rapid change. No pneumothorax. HEART AND MEDIASTINUM: Cardiomegaly. BONES AND SOFT TISSUES: No acute osseous abnormality. IMPRESSION: 1. Mild patchy interstitial/airspace opacities, favoring interstitial/alveolar edema over pneumonia. 2. Cardiomegaly. Electronically signed by: Pinkie Pebbles MD 09/23/2023 08:26 PM EDT RP Workstation: [id number]   CT CHEST HIGH RESOLUTION Result Date: 09/22/2023 CLINICAL DATA:  Pulmonary infiltrates, chronic cough, shortness of breath nonsmoker EXAM: CT CHEST WITHOUT CONTRAST TECHNIQUE: Multidetector CT imaging of the chest was performed following the standard protocol without intravenous contrast. High resolution imaging of the lungs, as well as inspiratory and expiratory imaging, was performed. RADIATION DOSE REDUCTION: This exam was performed according to the departmental dose-optimization program which includes automated exposure control, adjustment of the  mA and/or kV according to patient size and/or use of iterative reconstruction technique. COMPARISON:  01/08/2023 FINDINGS: Cardiovascular: Scattered aortic atherosclerosis. Normal heart size. Enlargement of the main pulmonary artery measuring up to 3.9 cm in caliber. No pericardial effusion. Mediastinum/Nodes: No enlarged mediastinal, hilar, or axillary lymph nodes. Thyroid  gland, trachea, and esophagus demonstrate no significant findings. Lungs/Pleura: Mild diffuse bilateral bronchial wall thickening. Interval resolution of the majority of previously seen ground-glass airspace opacities throughout the chest however there is new irregular ground-glass airspace opacity scattered in the lung bases, particularly in the medial right lower lobe (series 7, image 119). No significant air trapping on expiratory phase imaging no pleural effusion or pneumothorax. Upper Abdomen: No acute abnormality.  Hepatic steatosis. Musculoskeletal: No chest wall abnormality. No acute osseous findings. IMPRESSION: 1. Interval resolution of the majority of previously seen ground-glass airspace opacities throughout the chest however there is new irregular ground-glass airspace opacity scattered in  the lung bases, particularly in the medial right lower lobe. Fluctuant findings are consistent with ongoing infection or inflammation. No evidence of fibrotic interstitial lung disease. 2. Mild diffuse bilateral bronchial wall thickening, consistent with nonspecific infectious or inflammatory bronchitis. 3. Enlargement of the main pulmonary artery, as can be seen in pulmonary hypertension. 4. Hepatic steatosis. Aortic Atherosclerosis (ICD10-I70.0). Electronically Signed   By: Marolyn JONETTA Jaksch M.D.   On: 09/22/2023 20:37   US  Abdomen Limited RUQ (LIVER/GB) Result Date: 08/31/2023 CLINICAL DATA:  Elevated liver function tests. History of fatty liver. EXAM: ULTRASOUND ABDOMEN LIMITED RIGHT UPPER QUADRANT COMPARISON:  Ultrasound 02/28/2022 FINDINGS:  Gallbladder: Dilated gallbladder. No shadowing stones. No wall thickening or adjacent fluid. Common bile duct: Diameter: 2 mm Liver: Diffusely echogenic hepatic parenchyma consistent with fatty liver infiltration. Small hypoechoic area in the liver adjacent to the gallbladder fossa could be an area of fatty sparing. Portal vein is patent on color Doppler imaging with normal direction of blood flow towards the liver. Other: None. IMPRESSION: Fatty liver infiltration. Presumed area of fatty sparing next of the dilated gallbladder. No shadowing stones or ductal dilatation. Electronically Signed   By: Ranell Bring M.D.   On: 08/31/2023 09:49    Alm Schneider, DO  [facility]  If 7PM-7AM, please contact night-coverage [email or web address] Password [email or web address] 09/27/2023, 5:46 PM   LOS: 4 days

## 2023-09-28 ENCOUNTER — Inpatient Hospital Stay (HOSPITAL_COMMUNITY)

## 2023-09-28 ENCOUNTER — Encounter (HOSPITAL_COMMUNITY): Payer: Self-pay | Admitting: Internal Medicine

## 2023-09-28 DIAGNOSIS — R0609 Other forms of dyspnea: Secondary | ICD-10-CM

## 2023-09-28 DIAGNOSIS — J9601 Acute respiratory failure with hypoxia: Secondary | ICD-10-CM | POA: Diagnosis not present

## 2023-09-28 DIAGNOSIS — R748 Abnormal levels of other serum enzymes: Secondary | ICD-10-CM | POA: Diagnosis not present

## 2023-09-28 DIAGNOSIS — J4551 Severe persistent asthma with (acute) exacerbation: Secondary | ICD-10-CM | POA: Diagnosis not present

## 2023-09-28 LAB — ECHOCARDIOGRAM COMPLETE
Area-P 1/2: 3.03 cm2
Height: 70 in
S' Lateral: 2.7 cm
Weight: 4511.49 [oz_av]

## 2023-09-28 LAB — CULTURE, BLOOD (ROUTINE X 2)
Culture: NO GROWTH
Culture: NO GROWTH
Special Requests: ADEQUATE
Special Requests: ADEQUATE

## 2023-09-28 LAB — COMPREHENSIVE METABOLIC PANEL WITH GFR
ALT: 99 U/L — ABNORMAL HIGH (ref 0–44)
AST: 159 U/L — ABNORMAL HIGH (ref 15–41)
Albumin: 2.3 g/dL — ABNORMAL LOW (ref 3.5–5.0)
Alkaline Phosphatase: 209 U/L — ABNORMAL HIGH (ref 38–126)
Anion gap: 7 (ref 5–15)
BUN: 21 mg/dL — ABNORMAL HIGH (ref 6–20)
CO2: 22 mmol/L (ref 22–32)
Calcium: 8.3 mg/dL — ABNORMAL LOW (ref 8.9–10.3)
Chloride: 105 mmol/L (ref 98–111)
Creatinine, Ser: 0.72 mg/dL (ref 0.61–1.24)
GFR, Estimated: >60 mL/min (ref >60)
Glucose, Bld: 150 mg/dL — ABNORMAL HIGH (ref 70–99)
Potassium: 4.3 mmol/L (ref 3.5–5.1)
Sodium: 134 mmol/L — ABNORMAL LOW (ref 135–145)
Total Bilirubin: 2.4 mg/dL — ABNORMAL HIGH (ref 0.0–1.2)
Total Protein: 6.8 g/dL (ref 6.5–8.1)

## 2023-09-28 MED ORDER — FUROSEMIDE 10 MG/ML IJ SOLN
40.0000 mg | Freq: Once | INTRAMUSCULAR | Status: AC
  Administered 2023-09-28: 40 mg via INTRAVENOUS
  Filled 2023-09-28: qty 4

## 2023-09-28 NOTE — Plan of Care (Signed)
  Problem: Education: Goal: Knowledge of General Education information will improve Description: Including pain rating scale, medication(s)/side effects and non-pharmacologic comfort measures Outcome: Progressing   Problem: Health Behavior/Discharge Planning: Goal: Ability to manage health-related needs will improve Outcome: Progressing   Problem: Clinical Measurements: Goal: Ability to maintain clinical measurements within normal limits will improve Outcome: Progressing Goal: Will remain free from infection Outcome: Progressing Goal: Diagnostic test results will improve Outcome: Progressing Goal: Respiratory complications will improve 09/28/2023 2255 by Dauna Harlene SAILOR, RN Outcome: Progressing 09/28/2023 2255 by Dauna Harlene SAILOR, RN Outcome: Progressing Goal: Cardiovascular complication will be avoided Outcome: Progressing   Problem: Coping: Goal: Level of anxiety will decrease Outcome: Progressing   Problem: Elimination: Goal: Will not experience complications related to urinary retention Outcome: Progressing   Problem: Safety: Goal: Ability to remain free from injury will improve Outcome: Progressing   Problem: Skin Integrity: Goal: Risk for impaired skin integrity will decrease Outcome: Progressing

## 2023-09-28 NOTE — Progress Notes (Signed)
 PROGRESS NOTE  Ryan Holland FMW:[id number] DOB: [birth year] DOA: 09/23/2023 PCP: Dorina Loving, [name]  Brief History:  53 y.o. male with medical history significant for OSA on CPAP, obesity, asthma, alcohol  use disorder, who presents to the ER due to shortness of breath for the past 4 days.  Associated with a persistent cough.  Endorses subjective fevers and intermittent chills.   In the ER, febrile, hypoxic requiring 4 L nasal cannula to maintain O2 saturation above 92%, tachycardic, tachypneic.  CT angio chest was negative for acute pulmonary embolism however showed interval development of diffuse, lower lung zone predominant, peribronchovascular patchy airspace opacities.  Findings may represent infection/inflammation versus alveolar hemorrhage.   Additionally, the patient had a CT abdomen and pelvis with contrast which revealed mediastinal lymphadenopathy, likely reactive in etiology.  Splenomegaly.   Lab work was notable for elevated liver chemistries and T. bili of 6.9.  Admits to excessive alcohol  use.   Due to concern for possible multifocal CAP, the patient was started on Rocephin  and IV azithromycin .  He also received IV Solu-Medrol , and bronchodilator nebulizers in the ER.  Admitted by [facility], hospitalist service.     Assessment/Plan: Acute hypoxic respiratory failure secondary to Lobar pneumonia and asthma exacerbation   -Personally reviewed CT chest which showed bilateral lower lobe infiltrates/patchy opacities  -Currently requiring 4 L to maintain O2 saturation above 92% -wean oxygen for saturation >92% - PCT 0.88 -Continue empiric IV ceftiraxone and azithro -Continue IV Solu-Medrol  -added brovana  and pulmicort  -Incentive spirometer -added loratidine -viral respiratory panel+metapneumovirus -give lasix  IV 40 mg x 1   Acute asthma exacerbation -continue BDs as above -Continue Rocephin  and IV azithromycin  -Continue IV Solu-Medrol  -add hycodan   Alcohol   abuse with concern for alcohol  withdrawal -CIWA protocol, added librium  taper as he reported 3 liters of alcohol  per day (vodka)  -Multivitamins, thiamine , folic acid  supplement Interested in alcohol  cessation - consult to [facility]    Hepatic Steatosis  Alcoholic hepatitis -viral hepatitis panel neg -Avoid hepatotoxic agents. -LFTs largely stable -Pt advised that he will need to continue to follow up with GI outpatient    Hyperbilirubinemia, -CT abdomen pelvis with contrast was unrevealing, no biliary dilatation, no gallstone no focal liver abnormality -due to cholestasis -bili trending down -liver doppler--steatosis, no portal venous thrombus   Splenomegaly -Spleen measuring up to 17 cm.  No focal splenic lesion   Thrombocytopenia, mild -Platelet count 128 in the setting of alcohol  abuse/splenomegaly -viral resp panel +metapneumovirus -B12--1978 -folate--7.3>>replete   Non anion gap metabolic acidosis Serum bicarb 19, anion gap 14 Received LR fluid in ED -improved   Elevated troponin, suspect demand ischemia in the setting of hypoxia High-sensitivity troponin peaked at 20>>16 No evidence of acute ischemia on twelve-lead EKG -obtain echo   Class III Obesity BMI 40.46 -lifestyle modification   Dyslipidemia -- LDL 128, HDL 13, TC 234 -- atorvastatin  40 mg every evening    OSA CPAP nightly Suspect he may have undiagnosed OHS   Hyponatremia -- sodium improved    Low cortisol -collected after pt was given IV steroids -dubious clinical significance     Family Communication:   spouse 7/23   Consultants:  none   Code Status:  FULL   DVT Prophylaxis:  SCDs     Procedures: As Listed in Progress Note Above   Antibiotics: Ceftriaxone  7/20>>7/26 Azithro 7/20>>7/26           Subjective: Pt feel like he is breathing better.  Denies f/c, cp, n/v/d. Has less DOE  Objective: Vitals:   09/27/23 1231 09/27/23 1959 09/27/23 2300 09/28/23 0618  BP: (!)  128/109 137/83  (!) 133/91  Pulse: 90 78  66  Resp: 20 18 (!) 25 18  Temp: (!) 97.5 F (36.4 C) 98.2 F (36.8 C)  97.9 F (36.6 C)  TempSrc: Oral Oral    SpO2: 96% 98%  100%  Weight:      Height:        Intake/Output Summary (Last 24 hours) at 09/28/2023 1355 Last data filed at 09/28/2023 1042 Gross per 24 hour  Intake 850 ml  Output 3550 ml  Net -2700 ml   Weight change:  Exam:  General:  Pt is alert, follows commands appropriately, not in acute distress HEENT: No icterus, No thrush, No neck mass, Gibbs/AT Cardiovascular: RRR, S1/S2, no rubs, no gallops Respiratory: fine bibasilar rales.  Min basilar wheeze Abdomen: Soft/+BS, non tender, non distended, no guarding Extremities: No edema, No lymphangitis, No petechiae, No rashes, no synovitis   Data Reviewed: I have personally reviewed following labs and imaging studies Basic Metabolic Panel: Recent Labs  Lab 09/24/23 0227 09/25/23 0439 09/26/23 0423 09/27/23 0443 09/28/23 0431  NA 128* 134* 136 132* 134*  K 3.9 4.1 3.9 4.3 4.3  CL 95* 102 104 106 105  CO2 19* 23 22 19* 22  GLUCOSE 126* 141* 115* 167* 150*  BUN 13 17 20 19  21*  CREATININE 0.79 0.80 0.65 0.76 0.72  CALCIUM  8.4* 8.7* 8.4* 8.4* 8.3*  MG 1.4* 2.4  --   --   --   PHOS 2.2*  --   --   --   --    Liver Function Tests: Recent Labs  Lab 09/24/23 0227 09/25/23 0439 09/26/23 0423 09/27/23 0443 09/28/23 0431  AST 228* 182* 164* 172* 159*  ALT 60* 70* 71* 89* 99*  ALKPHOS 189* 212* 187* 214* 209*  BILITOT 6.6* 3.9* 2.6* 2.8* 2.4*  PROT 7.2 7.5 6.4* 6.8 6.8  ALBUMIN 2.7* 2.7* 2.3* 2.4* 2.3*   No results for input(s): LIPASE, AMYLASE in the last 168 hours. No results for input(s): AMMONIA in the last 168 hours. Coagulation Profile: Recent Labs  Lab 09/23/23 2005 09/25/23 0907  INR 1.1 1.2   CBC: Recent Labs  Lab 09/23/23 2005 09/24/23 0227  WBC 7.5 6.6  NEUTROABS 5.7 5.6  HGB 14.3 13.1  HCT 40.7 38.3*  MCV 104.4* 105.5*  PLT 128*  118*   Cardiac Enzymes: No results for input(s): CKTOTAL, CKMB, CKMBINDEX, TROPONINI in the last 168 hours. BNP: Invalid input(s): POCBNP CBG: No results for input(s): GLUCAP in the last 168 hours. HbA1C: No results for input(s): HGBA1C in the last 72 hours. Urine analysis:    Component Value Date/Time   COLORURINE AMBER (A) 09/24/2023 0300   APPEARANCEUR CLEAR 09/24/2023 0300   LABSPEC >1.046 (H) 09/24/2023 0300   PHURINE 6.0 09/24/2023 0300   GLUCOSEU NEGATIVE 09/24/2023 0300   HGBUR NEGATIVE 09/24/2023 0300   BILIRUBINUR SMALL (A) 09/24/2023 0300   BILIRUBINUR negative 03/17/2021 1015   KETONESUR NEGATIVE 09/24/2023 0300   PROTEINUR NEGATIVE 09/24/2023 0300   UROBILINOGEN 1.0 03/17/2021 1015   UROBILINOGEN 0.2 07/24/2011 1033   NITRITE NEGATIVE 09/24/2023 0300   LEUKOCYTESUR NEGATIVE 09/24/2023 0300   Sepsis Labs: @LABRCNTIP (procalcitonin:4,lacticidven:4) ) Recent Results (from the past 240 hours)  Blood Culture (routine x 2)     Status: None   Collection Time: 09/23/23  8:05 PM   Specimen: Left  Antecubital; Blood  Result Value Ref Range Status   Specimen Description LEFT ANTECUBITAL  Final   Special Requests   Final    BOTTLES DRAWN AEROBIC AND ANAEROBIC Blood Culture adequate volume   Culture   Final    NO GROWTH 5 DAYS Performed at [facility], [address] [place], [place] [address]    Report Status 09/28/2023 FINAL  Final  Blood Culture (routine x 2)     Status: None   Collection Time: 09/23/23  8:20 PM   Specimen: BLOOD LEFT HAND  Result Value Ref Range Status   Specimen Description BLOOD LEFT HAND  Final   Special Requests   Final    BOTTLES DRAWN AEROBIC AND ANAEROBIC Blood Culture adequate volume   Culture   Final    NO GROWTH 5 DAYS Performed at [facility], [address], [place], [place] [address]    Report Status 09/28/2023 FINAL  Final  Resp panel by RT-PCR (RSV, Flu A&B, Covid) Anterior Nasal Swab     Status: None    Collection Time: 09/23/23  8:49 PM   Specimen: Anterior Nasal Swab  Result Value Ref Range Status   SARS Coronavirus 2 by RT PCR NEGATIVE NEGATIVE Final    Comment: (NOTE) SARS-CoV-2 target nucleic acids are NOT DETECTED.  The SARS-CoV-2 RNA is generally detectable in upper respiratory specimens during the acute phase of infection. The lowest concentration of SARS-CoV-2 viral copies this assay can detect is 138 copies/mL. A negative result does not preclude SARS-Cov-2 infection and should not be used as the sole basis for treatment or other patient management decisions. A negative result may occur with  improper specimen collection/handling, submission of specimen other than nasopharyngeal swab, presence of viral mutation(s) within the areas targeted by this assay, and inadequate number of viral copies(<138 copies/mL). A negative result must be combined with clinical observations, patient history, and epidemiological information. The expected result is Negative.  Fact Sheet for Patients:  BloggerCourse.com  Fact Sheet for Healthcare Providers:  SeriousBroker.it  This test is no t yet approved or cleared by the [place]  FDA and  has been authorized for detection and/or diagnosis of SARS-CoV-2 by FDA under an Emergency Use Authorization (EUA). This EUA will remain  in effect (meaning this test can be used) for the duration of the COVID-19 declaration under Section 564(b)(1) of the Act, 21 U.S.C.section 360bbb-3(b)(1), unless the authorization is terminated  or revoked sooner.       Influenza A by PCR NEGATIVE NEGATIVE Final   Influenza B by PCR NEGATIVE NEGATIVE Final    Comment: (NOTE) The Xpert Xpress SARS-CoV-2/FLU/RSV plus assay is intended as an aid in the diagnosis of influenza from Nasopharyngeal swab specimens and should not be used as a sole basis for treatment. Nasal washings and aspirates are unacceptable for  Xpert Xpress SARS-CoV-2/FLU/RSV testing.  Fact Sheet for Patients: BloggerCourse.com  Fact Sheet for Healthcare Providers: SeriousBroker.it  This test is not yet approved or cleared by the [place]  FDA and has been authorized for detection and/or diagnosis of SARS-CoV-2 by FDA under an Emergency Use Authorization (EUA). This EUA will remain in effect (meaning this test can be used) for the duration of the COVID-19 declaration under Section 564(b)(1) of the Act, 21 U.S.C. section 360bbb-3(b)(1), unless the authorization is terminated or revoked.     Resp Syncytial Virus by PCR NEGATIVE NEGATIVE Final    Comment: (NOTE) Fact Sheet for Patients: BloggerCourse.com  Fact Sheet for Healthcare Providers: SeriousBroker.it  This test is not yet approved or cleared by the [place]  FDA and has been authorized for detection and/or diagnosis of SARS-CoV-2 by FDA under an Emergency Use Authorization (EUA). This EUA will remain in effect (meaning this test can be used) for the duration of the COVID-19 declaration under Section 564(b)(1) of the Act, 21 U.S.C. section 360bbb-3(b)(1), unless the authorization is terminated or revoked.  Performed at [facility], [address] [place], [place] [address]   MRSA Next Gen by PCR, Nasal     Status: None   Collection Time: 09/24/23  1:04 AM   Specimen: Nasal Mucosa; Nasal Swab  Result Value Ref Range Status   MRSA by PCR Next Gen NOT DETECTED NOT DETECTED Final    Comment: (NOTE) The GeneXpert MRSA Assay (FDA approved for NASAL specimens only), is one component of a comprehensive MRSA colonization surveillance program. It is not intended to diagnose MRSA infection nor to guide or monitor treatment for MRSA infections. Test performance is not FDA approved in patients less than 82 years old. Performed at [facility], [address]  [place], [place] [address]   Expectorated Sputum Assessment w Gram Stain, Rflx to Resp Cult     Status: None   Collection Time: 09/25/23  1:57 PM   Specimen: Expectorated Sputum  Result Value Ref Range Status   Specimen Description EXPECTORATED SPUTUM  Final   Special Requests Normal  Final   Sputum evaluation   Final    THIS SPECIMEN IS ACCEPTABLE FOR SPUTUM CULTURE Performed at [facility], [address] [place], [place] [address]    Report Status 09/25/2023 FINAL  Final  Culture, Respiratory w Gram Stain     Status: None   Collection Time: 09/25/23  1:57 PM  Result Value Ref Range Status   Specimen Description   Final    EXPECTORATED SPUTUM Performed at [facility], [address] [place], [place] [address]    Special Requests   Final    Normal Reflexed from [phone] Performed at [facility], [address], [place], [place] [address]    Gram Stain   Final    RARE WBC SEEN RARE GRAM POSITIVE COCCI RARE GRAM NEGATIVE RODS    Culture   Final    Normal respiratory flora-no Staph aureus or Pseudomonas seen Performed at [facility], [address], [place], [place] [address]    Report Status 09/27/2023 FINAL  Final  Respiratory (~20 pathogens) panel by PCR     Status: Abnormal   Collection Time: 09/26/23  5:55 PM   Specimen: Nasopharyngeal Swab; Respiratory  Result Value Ref Range Status   Adenovirus NOT DETECTED NOT DETECTED Final   Coronavirus 229E NOT DETECTED NOT DETECTED Final    Comment: (NOTE) The Coronavirus on the Respiratory Panel, DOES NOT test for the novel  Coronavirus (2019 nCoV)    Coronavirus HKU1 NOT DETECTED NOT DETECTED Final   Coronavirus NL63 NOT DETECTED NOT DETECTED Final   Coronavirus OC43 NOT DETECTED NOT DETECTED Final   Metapneumovirus DETECTED (A) NOT DETECTED Final   Rhinovirus / Enterovirus NOT DETECTED NOT DETECTED Final   Influenza A NOT DETECTED NOT DETECTED Final   Influenza B NOT DETECTED NOT DETECTED Final   Parainfluenza Virus 1  NOT DETECTED NOT DETECTED Final   Parainfluenza Virus 2 NOT DETECTED NOT DETECTED Final   Parainfluenza Virus 3 NOT DETECTED NOT DETECTED Final   Parainfluenza Virus 4 NOT DETECTED NOT DETECTED Final   Respiratory Syncytial Virus  NOT DETECTED NOT DETECTED Final   Bordetella pertussis NOT DETECTED NOT DETECTED Final   Bordetella Parapertussis NOT DETECTED NOT DETECTED Final   Chlamydophila pneumoniae NOT DETECTED NOT DETECTED Final   Mycoplasma pneumoniae NOT DETECTED NOT DETECTED Final    Comment: Performed at [facility], [address] [place],  [address]     Scheduled Meds:  arformoterol   15 mcg Nebulization BID   atorvastatin   40 mg Oral QPM   budesonide  (PULMICORT ) nebulizer solution  0.5 mg Nebulization BID   citalopram   40 mg Oral Daily   folic acid   1 mg Oral Daily   furosemide   40 mg Intravenous Once   ipratropium-albuterol   3 mL Nebulization TID   loratadine   10 mg Oral Daily   methylPREDNISolone  (SOLU-MEDROL ) injection  60 mg Intravenous Q12H   montelukast   10 mg Oral QHS   multivitamin with minerals  1 tablet Oral Daily   pantoprazole   40 mg Oral Daily   thiamine   100 mg Oral Daily   Or   thiamine   100 mg Intravenous Daily   Continuous Infusions:  azithromycin  Stopped (09/27/23 2214)   cefTRIAXone  (ROCEPHIN )  IV 2 g (09/28/23 0935)    Procedures/Studies: US  ABDOMEN LIMITED WITH LIVER DOPPLER Result Date: 09/24/2023 CLINICAL DATA:  Elevated liver function tests EXAM: DUPLEX ULTRASOUND OF LIVER TECHNIQUE: Color and duplex Doppler ultrasound was performed to evaluate the hepatic in-flow and out-flow vessels. COMPARISON:  CT abdomen pelvis September 23, 2023 FINDINGS: Liver: Increased parenchymal echogenicity. Non cirrhotic morphology. No focal lesion, mass or intrahepatic biliary ductal dilatation. Main Portal Vein size: 1.4 cm Portal Vein Velocities Main Prox:  69 cm/sec Main Mid: 62 cm/sec Main Dist:  39 cm/sec Right: 30 cm/sec Left: 48 cm/sec Hepatic Vein  Velocities Right:  50 cm/sec Middle:  45 cm/sec Left:  15 cm/sec IVC: Present and patent with normal respiratory phasicity.  80 cm/s Hepatic Artery Velocity:  92 cm/sec Portal Vein Occlusion/Thrombus: No Splenic Vein Occlusion/Thrombus: No Ascites: None Varices: None IMPRESSION: Increased hepatic echogenicity suggestive of hepatic steatosis. Correlate with the function tests. Otherwise normal doppler ultrasound. Electronically Signed   By: Megan  Zare M.D.   On: 09/24/2023 15:55   CT Angio Chest PE W/Cm &/Or Wo Cm Result Date: 09/23/2023 CLINICAL DATA:  Pulmonary embolism (PE) suspected, high prob; Epigastric pain c/o increased sob and not feeling good since Friday. When ems arrived pt o2 sat was in 80s. They placed pt on NRB @12lpm  Epigastric pain EXAM: CT ANGIOGRAPHY CHEST CT ABDOMEN AND PELVIS WITH CONTRAST TECHNIQUE: Multidetector CT imaging of the chest was performed using the standard protocol during bolus administration of intravenous contrast. Multiplanar CT image reconstructions and MIPs were obtained to evaluate the vascular anatomy. Multidetector CT imaging of the abdomen and pelvis was performed using the standard protocol during bolus administration of intravenous contrast. RADIATION DOSE REDUCTION: This exam was performed according to the departmental dose-optimization program which includes automated exposure control, adjustment of the mA and/or kV according to patient size and/or use of iterative reconstruction technique. CONTRAST:  75mL OMNIPAQUE  IOHEXOL  350 MG/ML SOLN COMPARISON:  Chest x-ray 09/23/2023, CT chest 12/12/2022 FINDINGS: CTA CHEST FINDINGS Cardiovascular: Satisfactory opacification of the pulmonary arteries to the segmental level. No evidence of pulmonary embolism. Normal heart size. No significant pericardial effusion. The thoracic aorta is normal in caliber. No atherosclerotic plaque of the thoracic aorta. No coronary artery calcifications. Mediastinum/Nodes: Several prominent  nonenlarged mediastinal lymph nodes with as an example a 1.1 cm precarinal lymph  node (2:41). No hilar or axillary lymph nodes. Thyroid  gland, trachea, and esophagus demonstrate no significant findings. Lungs/Pleura: Interval development of diffuse, lower lung zone predominant, peribronchovascular patchy airspace opacities. No pulmonary nodule. No pulmonary mass. No pleural effusion. No pneumothorax. Musculoskeletal: No chest wall abnormality. No suspicious lytic or blastic osseous lesions. No acute displaced fracture. Multilevel degenerative changes of the spine. Review of the MIP images confirms the above findings. CT ABDOMEN and PELVIS FINDINGS Hepatobiliary: No focal liver abnormality. No gallstones, gallbladder wall thickening, or pericholecystic fluid. No biliary dilatation. Pancreas: Diffusely atrophic. No focal lesion. Otherwise normal pancreatic contour. No surrounding inflammatory changes. No main pancreatic ductal dilatation. Spleen: Spleen enlarged measuring up to 17 cm. No focal splenic lesion. Adrenals/Urinary Tract: No adrenal nodule bilaterally. Bilateral kidneys enhance symmetrically. No hydronephrosis. No hydroureter. The urinary bladder is unremarkable. On delayed imaging, there is no urothelial wall thickening and there are no filling defects in the opacified portions of the bilateral collecting systems or ureters. Stomach/Bowel: Stomach is within normal limits. No evidence of bowel wall thickening or dilatation. Appendix appears normal. Vascular/Lymphatic: No abdominal aorta or iliac aneurysm. Mild atherosclerotic plaque of the aorta and its branches. No abdominal, pelvic, or inguinal lymphadenopathy. Reproductive: Prostate is unremarkable. Other: No intraperitoneal free fluid. No intraperitoneal free gas. No organized fluid collection. Musculoskeletal: No abdominal wall hernia or abnormality. No suspicious lytic or blastic osseous lesions. No acute displaced fracture. Multilevel degenerative  changes of the spine. Review of the MIP images confirms the above findings. IMPRESSION: 1. Interval development of diffuse, lower lung zone predominant, peribronchovascular patchy airspace opacities. Findings may represente infection/inflammation (COVID-19 not excluded) versus alveolar hemorrhage. 2. Mediastinal lymphadenopathy. Finding likely reactive in etiology. Recommend attention on follow-up. 3. Splenomegaly. 4.  Aortic Atherosclerosis (ICD10-I70.0). Electronically Signed   By: Morgane  Naveau M.D.   On: 09/23/2023 21:26   CT ABDOMEN PELVIS W CONTRAST Result Date: 09/23/2023 CLINICAL DATA:  Pulmonary embolism (PE) suspected, high prob; Epigastric pain c/o increased sob and not feeling good since Friday. When ems arrived pt o2 sat was in 80s. They placed pt on NRB @12lpm  Epigastric pain EXAM: CT ANGIOGRAPHY CHEST CT ABDOMEN AND PELVIS WITH CONTRAST TECHNIQUE: Multidetector CT imaging of the chest was performed using the standard protocol during bolus administration of intravenous contrast. Multiplanar CT image reconstructions and MIPs were obtained to evaluate the vascular anatomy. Multidetector CT imaging of the abdomen and pelvis was performed using the standard protocol during bolus administration of intravenous contrast. RADIATION DOSE REDUCTION: This exam was performed according to the departmental dose-optimization program which includes automated exposure control, adjustment of the mA and/or kV according to patient size and/or use of iterative reconstruction technique. CONTRAST:  75mL OMNIPAQUE  IOHEXOL  350 MG/ML SOLN COMPARISON:  Chest x-ray 09/23/2023, CT chest 12/12/2022 FINDINGS: CTA CHEST FINDINGS Cardiovascular: Satisfactory opacification of the pulmonary arteries to the segmental level. No evidence of pulmonary embolism. Normal heart size. No significant pericardial effusion. The thoracic aorta is normal in caliber. No atherosclerotic plaque of the thoracic aorta. No coronary artery  calcifications. Mediastinum/Nodes: Several prominent nonenlarged mediastinal lymph nodes with as an example a 1.1 cm precarinal lymph node (2:41). No hilar or axillary lymph nodes. Thyroid  gland, trachea, and esophagus demonstrate no significant findings. Lungs/Pleura: Interval development of diffuse, lower lung zone predominant, peribronchovascular patchy airspace opacities. No pulmonary nodule. No pulmonary mass. No pleural effusion. No pneumothorax. Musculoskeletal: No chest wall abnormality. No suspicious lytic or blastic osseous lesions. No acute displaced fracture. Multilevel degenerative changes of the spine.  Review of the MIP images confirms the above findings. CT ABDOMEN and PELVIS FINDINGS Hepatobiliary: No focal liver abnormality. No gallstones, gallbladder wall thickening, or pericholecystic fluid. No biliary dilatation. Pancreas: Diffusely atrophic. No focal lesion. Otherwise normal pancreatic contour. No surrounding inflammatory changes. No main pancreatic ductal dilatation. Spleen: Spleen enlarged measuring up to 17 cm. No focal splenic lesion. Adrenals/Urinary Tract: No adrenal nodule bilaterally. Bilateral kidneys enhance symmetrically. No hydronephrosis. No hydroureter. The urinary bladder is unremarkable. On delayed imaging, there is no urothelial wall thickening and there are no filling defects in the opacified portions of the bilateral collecting systems or ureters. Stomach/Bowel: Stomach is within normal limits. No evidence of bowel wall thickening or dilatation. Appendix appears normal. Vascular/Lymphatic: No abdominal aorta or iliac aneurysm. Mild atherosclerotic plaque of the aorta and its branches. No abdominal, pelvic, or inguinal lymphadenopathy. Reproductive: Prostate is unremarkable. Other: No intraperitoneal free fluid. No intraperitoneal free gas. No organized fluid collection. Musculoskeletal: No abdominal wall hernia or abnormality. No suspicious lytic or blastic osseous lesions. No  acute displaced fracture. Multilevel degenerative changes of the spine. Review of the MIP images confirms the above findings. IMPRESSION: 1. Interval development of diffuse, lower lung zone predominant, peribronchovascular patchy airspace opacities. Findings may represente infection/inflammation (COVID-19 not excluded) versus alveolar hemorrhage. 2. Mediastinal lymphadenopathy. Finding likely reactive in etiology. Recommend attention on follow-up. 3. Splenomegaly. 4.  Aortic Atherosclerosis (ICD10-I70.0). Electronically Signed   By: Morgane  Naveau M.D.   On: 09/23/2023 21:26   DG Chest Port 1 View Result Date: 09/23/2023 EXAM: 1 VIEW XRAY OF THE CHEST 09/23/2023 08:23:00 PM COMPARISON: CT chest dated 09/21/2023. CLINICAL HISTORY: Questionable sepsis - evaluate for abnormality. Per triage SOB and not feeling good since Friday. When EMS arrived pt O2 sat was in 80s. FINDINGS: LUNGS AND PLEURA: Mild patchy interstitial/airspace opacities, lower lungs predominant, favoring interstitial/alveolar edema over pneumonia given rapid change. No pneumothorax. HEART AND MEDIASTINUM: Cardiomegaly. BONES AND SOFT TISSUES: No acute osseous abnormality. IMPRESSION: 1. Mild patchy interstitial/airspace opacities, favoring interstitial/alveolar edema over pneumonia. 2. Cardiomegaly. Electronically signed by: Pinkie Pebbles MD 09/23/2023 08:26 PM EDT RP Workstation: [id number]   CT CHEST HIGH RESOLUTION Result Date: 09/22/2023 CLINICAL DATA:  Pulmonary infiltrates, chronic cough, shortness of breath nonsmoker EXAM: CT CHEST WITHOUT CONTRAST TECHNIQUE: Multidetector CT imaging of the chest was performed following the standard protocol without intravenous contrast. High resolution imaging of the lungs, as well as inspiratory and expiratory imaging, was performed. RADIATION DOSE REDUCTION: This exam was performed according to the departmental dose-optimization program which includes automated exposure control, adjustment of the  mA and/or kV according to patient size and/or use of iterative reconstruction technique. COMPARISON:  01/08/2023 FINDINGS: Cardiovascular: Scattered aortic atherosclerosis. Normal heart size. Enlargement of the main pulmonary artery measuring up to 3.9 cm in caliber. No pericardial effusion. Mediastinum/Nodes: No enlarged mediastinal, hilar, or axillary lymph nodes. Thyroid  gland, trachea, and esophagus demonstrate no significant findings. Lungs/Pleura: Mild diffuse bilateral bronchial wall thickening. Interval resolution of the majority of previously seen ground-glass airspace opacities throughout the chest however there is new irregular ground-glass airspace opacity scattered in the lung bases, particularly in the medial right lower lobe (series 7, image 119). No significant air trapping on expiratory phase imaging no pleural effusion or pneumothorax. Upper Abdomen: No acute abnormality.  Hepatic steatosis. Musculoskeletal: No chest wall abnormality. No acute osseous findings. IMPRESSION: 1. Interval resolution of the majority of previously seen ground-glass airspace opacities throughout the chest however there is new irregular ground-glass airspace opacity scattered in  the lung bases, particularly in the medial right lower lobe. Fluctuant findings are consistent with ongoing infection or inflammation. No evidence of fibrotic interstitial lung disease. 2. Mild diffuse bilateral bronchial wall thickening, consistent with nonspecific infectious or inflammatory bronchitis. 3. Enlargement of the main pulmonary artery, as can be seen in pulmonary hypertension. 4. Hepatic steatosis. Aortic Atherosclerosis (ICD10-I70.0). Electronically Signed   By: Marolyn JONETTA Jaksch M.D.   On: 09/22/2023 20:37   US  Abdomen Limited RUQ (LIVER/GB) Result Date: 08/31/2023 CLINICAL DATA:  Elevated liver function tests. History of fatty liver. EXAM: ULTRASOUND ABDOMEN LIMITED RIGHT UPPER QUADRANT COMPARISON:  Ultrasound 02/28/2022 FINDINGS:  Gallbladder: Dilated gallbladder. No shadowing stones. No wall thickening or adjacent fluid. Common bile duct: Diameter: 2 mm Liver: Diffusely echogenic hepatic parenchyma consistent with fatty liver infiltration. Small hypoechoic area in the liver adjacent to the gallbladder fossa could be an area of fatty sparing. Portal vein is patent on color Doppler imaging with normal direction of blood flow towards the liver. Other: None. IMPRESSION: Fatty liver infiltration. Presumed area of fatty sparing next of the dilated gallbladder. No shadowing stones or ductal dilatation. Electronically Signed   By: Ranell Bring M.D.   On: 08/31/2023 09:49    Alm Schneider, DO  [facility]  If 7PM-7AM, please contact night-coverage [email or web address] Password [email or web address] 09/28/2023, 1:55 PM   LOS: 5 days

## 2023-09-28 NOTE — Plan of Care (Signed)
  Problem: Education: Goal: Knowledge of General Education information will improve Description: Including pain rating scale, medication(s)/side effects and non-pharmacologic comfort measures Outcome: Progressing   Problem: Health Behavior/Discharge Planning: Goal: Ability to manage health-related needs will improve Outcome: Progressing   Problem: Clinical Measurements: Goal: Ability to maintain clinical measurements within normal limits will improve Outcome: Progressing Goal: Will remain free from infection Outcome: Progressing Goal: Diagnostic test results will improve Outcome: Progressing Goal: Respiratory complications will improve Outcome: Progressing   Problem: Nutrition: Goal: Adequate nutrition will be maintained Outcome: Progressing   Problem: Coping: Goal: Level of anxiety will decrease Outcome: Progressing   Problem: Pain Managment: Goal: General experience of comfort will improve and/or be controlled Outcome: Progressing   Problem: Safety: Goal: Ability to remain free from injury will improve Outcome: Progressing   Problem: Skin Integrity: Goal: Risk for impaired skin integrity will decrease Outcome: Progressing

## 2023-09-28 NOTE — Progress Notes (Signed)
*  PRELIMINARY RESULTS* Echocardiogram 2D Echocardiogram has been performed.  Ryan Holland 09/28/2023, 4:11 PM

## 2023-09-28 NOTE — Progress Notes (Signed)
 Mobility Specialist Progress Note:    09/28/23 0905  Mobility  Activity Ambulated with assistance in room  Level of Assistance Standby assist, set-up cues, supervision of patient - no hands on  Assistive Device None  Distance Ambulated (ft) 60 ft  Range of Motion/Exercises Active;All extremities  Activity Response Tolerated well  Mobility Referral Yes  Mobility visit 1 Mobility  Mobility Specialist Start Time (ACUTE ONLY) [id number]  Mobility Specialist Stop Time (ACUTE ONLY) 0925  Mobility Specialist Time Calculation (min) (ACUTE ONLY) 20 min   Pt received in bed, agreeable to mobility. Required supervision to stand and ambulate with no AD. Tolerated well, SpO2 90% on 4L EOS. Returned pt supine, all needs met.  [place] W[place] Mobility Specialist Please contact via Special educational needs teacher or  Rehab office at [phone]

## 2023-09-28 NOTE — Plan of Care (Signed)
  Problem: Education: Goal: Knowledge of General Education information will improve Description: Including pain rating scale, medication(s)/side effects and non-pharmacologic comfort measures Outcome: Progressing   Problem: Clinical Measurements: Goal: Diagnostic test results will improve Outcome: Progressing   Problem: Clinical Measurements: Goal: Respiratory complications will improve Outcome: Progressing   Problem: Coping: Goal: Level of anxiety will decrease Outcome: Progressing

## 2023-09-28 NOTE — Plan of Care (Signed)
  Problem: Clinical Measurements: Goal: Respiratory complications will improve Outcome: Progressing Goal: Cardiovascular complication will be avoided Outcome: Progressing   Problem: Coping: Goal: Level of anxiety will decrease Outcome: Progressing   Problem: Elimination: Goal: Will not experience complications related to urinary retention Outcome: Progressing   Problem: Safety: Goal: Ability to remain free from injury will improve Outcome: Progressing   Problem: Skin Integrity: Goal: Risk for impaired skin integrity will decrease Outcome: Progressing

## 2023-09-29 ENCOUNTER — Other Ambulatory Visit (HOSPITAL_COMMUNITY): Payer: Self-pay

## 2023-09-29 DIAGNOSIS — K7010 Alcoholic hepatitis without ascites: Secondary | ICD-10-CM | POA: Diagnosis not present

## 2023-09-29 DIAGNOSIS — J9601 Acute respiratory failure with hypoxia: Secondary | ICD-10-CM | POA: Diagnosis not present

## 2023-09-29 DIAGNOSIS — J4551 Severe persistent asthma with (acute) exacerbation: Secondary | ICD-10-CM | POA: Diagnosis not present

## 2023-09-29 LAB — COMPREHENSIVE METABOLIC PANEL WITH GFR
ALT: 123 U/L — ABNORMAL HIGH (ref 0–44)
AST: 185 U/L — ABNORMAL HIGH (ref 15–41)
Albumin: 2.4 g/dL — ABNORMAL LOW (ref 3.5–5.0)
Alkaline Phosphatase: 207 U/L — ABNORMAL HIGH (ref 38–126)
Anion gap: 8 (ref 5–15)
BUN: 25 mg/dL — ABNORMAL HIGH (ref 6–20)
CO2: 24 mmol/L (ref 22–32)
Calcium: 8.4 mg/dL — ABNORMAL LOW (ref 8.9–10.3)
Chloride: 101 mmol/L (ref 98–111)
Creatinine, Ser: 0.81 mg/dL (ref 0.61–1.24)
GFR, Estimated: >60 mL/min (ref >60)
Glucose, Bld: 152 mg/dL — ABNORMAL HIGH (ref 70–99)
Potassium: 4.3 mmol/L (ref 3.5–5.1)
Sodium: 133 mmol/L — ABNORMAL LOW (ref 135–145)
Total Bilirubin: 2.8 mg/dL — ABNORMAL HIGH (ref 0.0–1.2)
Total Protein: 6.6 g/dL (ref 6.5–8.1)

## 2023-09-29 LAB — MAGNESIUM: Magnesium: 2.1 mg/dL (ref 1.7–2.4)

## 2023-09-29 LAB — PHOSPHORUS: Phosphorus: 4.5 mg/dL (ref 2.5–4.6)

## 2023-09-29 MED ORDER — PREDNISONE 10 MG PO TABS: 60.0000 mg | ORAL_TABLET | Freq: Every day | ORAL | 0 refills | Status: DC

## 2023-09-29 MED ORDER — PREDNISONE 10 MG (21) PO TBPK
ORAL_TABLET | ORAL | 0 refills | Status: DC
  Filled 2023-09-29: qty 21, 6d supply, fill #0

## 2023-09-29 MED ORDER — PREDNISONE 20 MG PO TABS
60.0000 mg | ORAL_TABLET | Freq: Every day | ORAL | Status: DC
  Administered 2023-09-29: 60 mg via ORAL
  Filled 2023-09-29: qty 3

## 2023-09-29 NOTE — Discharge Summary (Signed)
 Physician Discharge Summary   Patient: Ryan Holland MRN: [id number] DOB: [birth year]  Admit date:     09/23/2023  Discharge date: 09/29/23  Discharge Physician: Alm Montavious Wierzba   PCP: Dorina Loving, [name]   Recommendations at discharge:   Please follow up with primary care provider within 1-2 weeks  Please repeat BMP and CBC in one week     Hospital Course: 53 y.o. male with medical history significant for OSA on CPAP, obesity, asthma, alcohol  use disorder, who presents to the ER due to shortness of breath for the past 4 days.  Associated with a persistent cough.  Endorses subjective fevers and intermittent chills.   In the ER, febrile, hypoxic requiring 4 L nasal cannula to maintain O2 saturation above 92%, tachycardic, tachypneic.  CT angio chest was negative for acute pulmonary embolism however showed interval development of diffuse, lower lung zone predominant, peribronchovascular patchy airspace opacities.  Findings may represent infection/inflammation versus alveolar hemorrhage.   Additionally, the patient had a CT abdomen and pelvis with contrast which revealed mediastinal lymphadenopathy, likely reactive in etiology.  Splenomegaly.   Lab work was notable for elevated liver chemistries and T. bili of 6.9.  Admits to excessive alcohol  use.   Due to concern for possible multifocal CAP, the patient was started on Rocephin  and IV azithromycin .  He also received IV Solu-Medrol , and bronchodilator nebulizers in the ER.  Admitted by [facility], hospitalist service.    Assessment and Plan: Acute hypoxic respiratory failure secondary to Lobar pneumonia and asthma exacerbation   -Personally reviewed CT chest which showed bilateral lower lobe infiltrates/patchy opacities;  -CTA chest--no PE -Initially on 4 L to maintain O2 saturation above 92% -wean oxygen for saturation >92% - PCT 0.88 -Continue empiric IV ceftiraxone and azithro--received 7 days ceftriaxone  and 6 days azithro during  hospitalization -Continue IV Solu-Medrol  -added brovana  and pulmicort  -Incentive spirometer -added loratidine -viral respiratory panel+metapneumovirus -given lasix  IV 40 mg x 1 -d/c home with 2L oxygen as he desaturated to 86% on RA with ambulation   Acute asthma exacerbation -continue BDs as above -Continue Rocephin  and IV azithromycin  -Continued IV Solu-Medrol  -add hycodan -d/c home with prednisone  taper   Alcohol  abuse with concern for alcohol  withdrawal -CIWA protocol, added librium  taper as he reported 3 liters of alcohol  per day (vodka)  -Multivitamins, thiamine , folic acid  supplement Interested in alcohol  cessation - consult to [facility]  -finished librium  wean during hospitalization without further complications   Hepatic Steatosis  Alcoholic hepatitis -viral hepatitis panel neg -Avoid hepatotoxic agents. -LFTs largely stable -Pt advised that he will need to continue to follow up with GI outpatient    Hyperbilirubinemia, -CT abdomen pelvis with contrast was unrevealing, no biliary dilatation, no gallstone no focal liver abnormality -due to cholestasis -bili trending down -liver doppler--steatosis, no portal venous thrombus   Splenomegaly -Spleen measuring up to 17 cm.  No focal splenic lesion   Thrombocytopenia, mild -Platelet count 128 in the setting of alcohol  abuse/splenomegaly -viral resp panel +metapneumovirus -B12--1978 -folate--7.3>>replete   Non anion gap metabolic acidosis Serum bicarb 19, anion gap 14 Received LR fluid in ED -improved   Elevated troponin, suspect demand ischemia in the setting of hypoxia High-sensitivity troponin peaked at 20>>16 No evidence of acute ischemia on twelve-lead EKG -7/25 echo EF-- 60-65%, no WMA, normal RVF   Class III Obesity BMI 40.46 -lifestyle modification   Dyslipidemia -- LDL 128, HDL 13, TC 234 -- atorvastatin  40 mg every evening    OSA CPAP nightly Suspect he may have  undiagnosed OHS   Hyponatremia --  sodium improved    Low cortisol -collected after pt was given IV steroids -dubious clinical significance      Consultants: none Procedures performed: none  Disposition: Home Diet recommendation:  Cardiac diet DISCHARGE MEDICATION: Allergies as of 09/29/2023       Reactions   Other    LEMON-sores in mouth        Medication List     STOP taking these medications    losartan  100 MG tablet Commonly known as: COZAAR    OVER THE COUNTER MEDICATION   OVER THE COUNTER MEDICATION   Wegovy  0.25 MG/0.5ML Soaj Generic drug: Semaglutide -Weight Management       TAKE these medications    albuterol  108 (90 Base) MCG/ACT inhaler Commonly known as: ProAir  HFA Inhale 2 puffs into the lungs every 4 (four) hours as needed for wheezing or shortness of breath. What changed: how much to take   benzonatate  100 MG capsule Commonly known as: TESSALON  Take 1 capsule (100 mg total) by mouth 3 (three) times daily as needed for cough.   Breo Ellipta  200-25 MCG/ACT Aepb Generic drug: fluticasone  furoate-vilanterol Inhale 1 puff into the lungs daily.   cetirizine 10 MG tablet Commonly known as: ZYRTEC Take 10 mg by mouth daily.   citalopram  40 MG tablet Commonly known as: CELEXA  Take 1 tablet (40 mg total) by mouth daily.   diphenhydrAMINE 25 mg capsule Commonly known as: BENADRYL Take 50 mg by mouth in the morning.   Dupixent  300 MG/2ML Soaj Generic drug: Dupilumab  Inject 300 mg into the skin every 14 (fourteen) days.   fluticasone  50 MCG/ACT nasal spray Commonly known as: FLONASE  Place 2 sprays into both nostrils daily.   methocarbamol  500 MG tablet Commonly known as: ROBAXIN  Take 1 tablet by mouth every  8 hours as needed for muscle spams.   montelukast  10 MG tablet Commonly known as: SINGULAIR  Take 1 tablet (10 mg total) by mouth daily.   pantoprazole  40 MG tablet Commonly known as: PROTONIX  Take 1 tablet (40 mg total) by mouth daily.   predniSONE  10 MG  tablet Commonly known as: DELTASONE  Take 6 tablets (60 mg total) by mouth daily with breakfast. And decrease by one tablet daily   Repatha  SureClick 140 MG/ML Soaj Generic drug: Evolocumab  Inject 140 mg into the skin every 14 (fourteen) days.               Durable Medical Equipment  (From admission, onward)           Start     Ordered   09/29/23 1023  For home use only DME oxygen  Once       Comments: POC for Asthma  Question Answer Comment  Length of Need 6 Months   Mode or (Route) Nasal cannula   Liters per Minute 2   Frequency Continuous (stationary and portable oxygen unit needed)   Oxygen conserving device Yes   Oxygen delivery system Gas      09/29/23 1022            Discharge Exam: Filed Weights   09/23/23 1955 09/24/23 0121 09/24/23 1731  Weight: 127.9 kg 127.7 kg 127.9 kg   HEENT:  [place]/AT, No thrush, no icterus CV:  RRR, no rub, no S3, no S4 Lung:  fine bibasilar crackles.  No wheeze Abd:  soft/+BS, NT Ext:  No edema, no lymphangitis, no synovitis, no rash   Condition at discharge: stable  The results of significant diagnostics  from this hospitalization (including imaging, microbiology, ancillary and laboratory) are listed below for reference.   Imaging Studies: ECHOCARDIOGRAM COMPLETE Result Date: 09/28/2023    ECHOCARDIOGRAM REPORT   Patient Name:   KOEN ANTILLA Date of Exam: 09/28/2023 Medical Rec #:  [id number]       Height:       70.0 in Accession #:    [id number]      Weight:       282.0 lb Date of Birth:  12/20/1970       BSA:          2.415 m Patient Age:    53 years        BP:           128/109 mmHg Patient Gender: M               HR:           90 bpm. Exam Location:  [facility] Procedure: 2D Echo, Cardiac Doppler and Color Doppler (Both Spectral and Color            Flow Doppler were utilized during procedure). Indications:    Dyspnea R06.00  History:        Patient has prior history of Echocardiogram examinations, most                  recent 12/11/2022. Risk Factors:Hypertension. OSA on CPAP.  Sonographer:    Aida Pizza RCS Referring Phys: [phone] Kala Gassmann IMPRESSIONS  1. Left ventricular ejection fraction, by estimation, is 60 to 65%. The left ventricle has normal function. The left ventricle has no regional wall motion abnormalities. Left ventricular diastolic parameters were normal.  2. Right ventricular systolic function is normal. The right ventricular size is normal. Tricuspid regurgitation signal is inadequate for assessing PA pressure.  3. The mitral valve is normal in structure. Trivial mitral valve regurgitation. No evidence of mitral stenosis.  4. The aortic valve is normal in structure. Aortic valve regurgitation is not visualized. No aortic stenosis is present.  5. Aortic dilatation noted. There is mild dilatation of the aortic root, measuring 40 mm.  6. The inferior vena cava is normal in size with greater than 50% respiratory variability, suggesting right atrial pressure of 3 mmHg. FINDINGS  Left Ventricle: Left ventricular ejection fraction, by estimation, is 60 to 65%. The left ventricle has normal function. The left ventricle has no regional wall motion abnormalities. The left ventricular internal cavity size was normal in size. There is  no left ventricular hypertrophy. Left ventricular diastolic parameters were normal. Normal left ventricular filling pressure. Right Ventricle: The right ventricular size is normal. No increase in right ventricular wall thickness. Right ventricular systolic function is normal. Tricuspid regurgitation signal is inadequate for assessing PA pressure. Left Atrium: Left atrial size was normal in size. Right Atrium: Right atrial size was normal in size. Pericardium: There is no evidence of pericardial effusion. Mitral Valve: The mitral valve is normal in structure. Trivial mitral valve regurgitation. No evidence of mitral valve stenosis. Tricuspid Valve: The tricuspid valve is normal in structure.  Tricuspid valve regurgitation is not demonstrated. No evidence of tricuspid stenosis. Aortic Valve: The aortic valve is normal in structure. Aortic valve regurgitation is not visualized. No aortic stenosis is present. Pulmonic Valve: The pulmonic valve was normal in structure. Pulmonic valve regurgitation is not visualized. No evidence of pulmonic stenosis. Aorta: Aortic dilatation noted. There is mild dilatation of the aortic root, measuring 40 mm. Venous: The inferior vena  cava is normal in size with greater than 50% respiratory variability, suggesting right atrial pressure of 3 mmHg. IAS/Shunts: No atrial level shunt detected by color flow Doppler.  LEFT VENTRICLE PLAX 2D LVIDd:         5.30 cm   Diastology LVIDs:         2.70 cm   LV e' medial:    9.57 cm/s LV PW:         1.20 cm   LV E/e' medial:  14.3 LV IVS:        1.10 cm   LV e' lateral:   11.90 cm/s LVOT diam:     2.20 cm   LV E/e' lateral: 11.5 LV SV:         103 LV SV Index:   43 LVOT Area:     3.80 cm  RIGHT VENTRICLE RV S prime:     25.20 cm/s TAPSE (M-mode): 2.5 cm LEFT ATRIUM           Index        RIGHT ATRIUM           Index LA diam:      3.30 cm 1.37 cm/m   RA Area:     18.10 cm LA Vol (A2C): 81.4 ml 33.70 ml/m  RA Volume:   51.00 ml  21.11 ml/m LA Vol (A4C): 77.2 ml 31.96 ml/m  AORTIC VALVE LVOT Vmax:   157.00 cm/s LVOT Vmean:  102.000 cm/s LVOT VTI:    0.272 m  AORTA Ao Root diam: 4.00 cm MITRAL VALVE MV Area (PHT): 3.03 cm     SHUNTS MV Decel Time: 250 msec     Systemic VTI:  0.27 m MV E velocity: 137.00 cm/s  Systemic Diam: 2.20 cm MV A velocity: 101.00 cm/s MV E/A ratio:  1.36 Wilbert Bihari MD Electronically signed by Wilbert Bihari MD Signature Date/Time: 09/28/2023/4:42:34 PM    Final    US  ABDOMEN LIMITED WITH LIVER DOPPLER Result Date: 09/24/2023 CLINICAL DATA:  Elevated liver function tests EXAM: DUPLEX ULTRASOUND OF LIVER TECHNIQUE: Color and duplex Doppler ultrasound was performed to evaluate the hepatic in-flow and out-flow  vessels. COMPARISON:  CT abdomen pelvis September 23, 2023 FINDINGS: Liver: Increased parenchymal echogenicity. Non cirrhotic morphology. No focal lesion, mass or intrahepatic biliary ductal dilatation. Main Portal Vein size: 1.4 cm Portal Vein Velocities Main Prox:  69 cm/sec Main Mid: 62 cm/sec Main Dist:  39 cm/sec Right: 30 cm/sec Left: 48 cm/sec Hepatic Vein Velocities Right:  50 cm/sec Middle:  45 cm/sec Left:  15 cm/sec IVC: Present and patent with normal respiratory phasicity.  80 cm/s Hepatic Artery Velocity:  92 cm/sec Portal Vein Occlusion/Thrombus: No Splenic Vein Occlusion/Thrombus: No Ascites: None Varices: None IMPRESSION: Increased hepatic echogenicity suggestive of hepatic steatosis. Correlate with the function tests. Otherwise normal doppler ultrasound. Electronically Signed   By: Megan  Zare M.D.   On: 09/24/2023 15:55   CT Angio Chest PE W/Cm &/Or Wo Cm Result Date: 09/23/2023 CLINICAL DATA:  Pulmonary embolism (PE) suspected, high prob; Epigastric pain c/o increased sob and not feeling good since Friday. When ems arrived pt o2 sat was in 80s. They placed pt on NRB @12lpm  Epigastric pain EXAM: CT ANGIOGRAPHY CHEST CT ABDOMEN AND PELVIS WITH CONTRAST TECHNIQUE: Multidetector CT imaging of the chest was performed using the standard protocol during bolus administration of intravenous contrast. Multiplanar CT image reconstructions and MIPs were obtained to evaluate the vascular anatomy. Multidetector CT imaging of the abdomen and  pelvis was performed using the standard protocol during bolus administration of intravenous contrast. RADIATION DOSE REDUCTION: This exam was performed according to the departmental dose-optimization program which includes automated exposure control, adjustment of the mA and/or kV according to patient size and/or use of iterative reconstruction technique. CONTRAST:  75mL OMNIPAQUE  IOHEXOL  350 MG/ML SOLN COMPARISON:  Chest x-ray 09/23/2023, CT chest 12/12/2022 FINDINGS: CTA  CHEST FINDINGS Cardiovascular: Satisfactory opacification of the pulmonary arteries to the segmental level. No evidence of pulmonary embolism. Normal heart size. No significant pericardial effusion. The thoracic aorta is normal in caliber. No atherosclerotic plaque of the thoracic aorta. No coronary artery calcifications. Mediastinum/Nodes: Several prominent nonenlarged mediastinal lymph nodes with as an example a 1.1 cm precarinal lymph node (2:41). No hilar or axillary lymph nodes. Thyroid  gland, trachea, and esophagus demonstrate no significant findings. Lungs/Pleura: Interval development of diffuse, lower lung zone predominant, peribronchovascular patchy airspace opacities. No pulmonary nodule. No pulmonary mass. No pleural effusion. No pneumothorax. Musculoskeletal: No chest wall abnormality. No suspicious lytic or blastic osseous lesions. No acute displaced fracture. Multilevel degenerative changes of the spine. Review of the MIP images confirms the above findings. CT ABDOMEN and PELVIS FINDINGS Hepatobiliary: No focal liver abnormality. No gallstones, gallbladder wall thickening, or pericholecystic fluid. No biliary dilatation. Pancreas: Diffusely atrophic. No focal lesion. Otherwise normal pancreatic contour. No surrounding inflammatory changes. No main pancreatic ductal dilatation. Spleen: Spleen enlarged measuring up to 17 cm. No focal splenic lesion. Adrenals/Urinary Tract: No adrenal nodule bilaterally. Bilateral kidneys enhance symmetrically. No hydronephrosis. No hydroureter. The urinary bladder is unremarkable. On delayed imaging, there is no urothelial wall thickening and there are no filling defects in the opacified portions of the bilateral collecting systems or ureters. Stomach/Bowel: Stomach is within normal limits. No evidence of bowel wall thickening or dilatation. Appendix appears normal. Vascular/Lymphatic: No abdominal aorta or iliac aneurysm. Mild atherosclerotic plaque of the aorta and its  branches. No abdominal, pelvic, or inguinal lymphadenopathy. Reproductive: Prostate is unremarkable. Other: No intraperitoneal free fluid. No intraperitoneal free gas. No organized fluid collection. Musculoskeletal: No abdominal wall hernia or abnormality. No suspicious lytic or blastic osseous lesions. No acute displaced fracture. Multilevel degenerative changes of the spine. Review of the MIP images confirms the above findings. IMPRESSION: 1. Interval development of diffuse, lower lung zone predominant, peribronchovascular patchy airspace opacities. Findings may represente infection/inflammation (COVID-19 not excluded) versus alveolar hemorrhage. 2. Mediastinal lymphadenopathy. Finding likely reactive in etiology. Recommend attention on follow-up. 3. Splenomegaly. 4.  Aortic Atherosclerosis (ICD10-I70.0). Electronically Signed   By: Morgane  Naveau M.D.   On: 09/23/2023 21:26   CT ABDOMEN PELVIS W CONTRAST Result Date: 09/23/2023 CLINICAL DATA:  Pulmonary embolism (PE) suspected, high prob; Epigastric pain c/o increased sob and not feeling good since Friday. When ems arrived pt o2 sat was in 80s. They placed pt on NRB @12lpm  Epigastric pain EXAM: CT ANGIOGRAPHY CHEST CT ABDOMEN AND PELVIS WITH CONTRAST TECHNIQUE: Multidetector CT imaging of the chest was performed using the standard protocol during bolus administration of intravenous contrast. Multiplanar CT image reconstructions and MIPs were obtained to evaluate the vascular anatomy. Multidetector CT imaging of the abdomen and pelvis was performed using the standard protocol during bolus administration of intravenous contrast. RADIATION DOSE REDUCTION: This exam was performed according to the departmental dose-optimization program which includes automated exposure control, adjustment of the mA and/or kV according to patient size and/or use of iterative reconstruction technique. CONTRAST:  75mL OMNIPAQUE  IOHEXOL  350 MG/ML SOLN COMPARISON:  Chest x-ray  09/23/2023, CT chest  12/12/2022 FINDINGS: CTA CHEST FINDINGS Cardiovascular: Satisfactory opacification of the pulmonary arteries to the segmental level. No evidence of pulmonary embolism. Normal heart size. No significant pericardial effusion. The thoracic aorta is normal in caliber. No atherosclerotic plaque of the thoracic aorta. No coronary artery calcifications. Mediastinum/Nodes: Several prominent nonenlarged mediastinal lymph nodes with as an example a 1.1 cm precarinal lymph node (2:41). No hilar or axillary lymph nodes. Thyroid  gland, trachea, and esophagus demonstrate no significant findings. Lungs/Pleura: Interval development of diffuse, lower lung zone predominant, peribronchovascular patchy airspace opacities. No pulmonary nodule. No pulmonary mass. No pleural effusion. No pneumothorax. Musculoskeletal: No chest wall abnormality. No suspicious lytic or blastic osseous lesions. No acute displaced fracture. Multilevel degenerative changes of the spine. Review of the MIP images confirms the above findings. CT ABDOMEN and PELVIS FINDINGS Hepatobiliary: No focal liver abnormality. No gallstones, gallbladder wall thickening, or pericholecystic fluid. No biliary dilatation. Pancreas: Diffusely atrophic. No focal lesion. Otherwise normal pancreatic contour. No surrounding inflammatory changes. No main pancreatic ductal dilatation. Spleen: Spleen enlarged measuring up to 17 cm. No focal splenic lesion. Adrenals/Urinary Tract: No adrenal nodule bilaterally. Bilateral kidneys enhance symmetrically. No hydronephrosis. No hydroureter. The urinary bladder is unremarkable. On delayed imaging, there is no urothelial wall thickening and there are no filling defects in the opacified portions of the bilateral collecting systems or ureters. Stomach/Bowel: Stomach is within normal limits. No evidence of bowel wall thickening or dilatation. Appendix appears normal. Vascular/Lymphatic: No abdominal aorta or iliac aneurysm.  Mild atherosclerotic plaque of the aorta and its branches. No abdominal, pelvic, or inguinal lymphadenopathy. Reproductive: Prostate is unremarkable. Other: No intraperitoneal free fluid. No intraperitoneal free gas. No organized fluid collection. Musculoskeletal: No abdominal wall hernia or abnormality. No suspicious lytic or blastic osseous lesions. No acute displaced fracture. Multilevel degenerative changes of the spine. Review of the MIP images confirms the above findings. IMPRESSION: 1. Interval development of diffuse, lower lung zone predominant, peribronchovascular patchy airspace opacities. Findings may represente infection/inflammation (COVID-19 not excluded) versus alveolar hemorrhage. 2. Mediastinal lymphadenopathy. Finding likely reactive in etiology. Recommend attention on follow-up. 3. Splenomegaly. 4.  Aortic Atherosclerosis (ICD10-I70.0). Electronically Signed   By: Morgane  Naveau M.D.   On: 09/23/2023 21:26   DG Chest Port 1 View Result Date: 09/23/2023 EXAM: 1 VIEW XRAY OF THE CHEST 09/23/2023 08:23:00 PM COMPARISON: CT chest dated 09/21/2023. CLINICAL HISTORY: Questionable sepsis - evaluate for abnormality. Per triage SOB and not feeling good since Friday. When EMS arrived pt O2 sat was in 80s. FINDINGS: LUNGS AND PLEURA: Mild patchy interstitial/airspace opacities, lower lungs predominant, favoring interstitial/alveolar edema over pneumonia given rapid change. No pneumothorax. HEART AND MEDIASTINUM: Cardiomegaly. BONES AND SOFT TISSUES: No acute osseous abnormality. IMPRESSION: 1. Mild patchy interstitial/airspace opacities, favoring interstitial/alveolar edema over pneumonia. 2. Cardiomegaly. Electronically signed by: Pinkie Pebbles MD 09/23/2023 08:26 PM EDT RP Workstation: [id number]   CT CHEST HIGH RESOLUTION Result Date: 09/22/2023 CLINICAL DATA:  Pulmonary infiltrates, chronic cough, shortness of breath nonsmoker EXAM: CT CHEST WITHOUT CONTRAST TECHNIQUE: Multidetector CT imaging  of the chest was performed following the standard protocol without intravenous contrast. High resolution imaging of the lungs, as well as inspiratory and expiratory imaging, was performed. RADIATION DOSE REDUCTION: This exam was performed according to the departmental dose-optimization program which includes automated exposure control, adjustment of the mA and/or kV according to patient size and/or use of iterative reconstruction technique. COMPARISON:  01/08/2023 FINDINGS: Cardiovascular: Scattered aortic atherosclerosis. Normal heart size. Enlargement of the main pulmonary artery measuring up to 3.9  cm in caliber. No pericardial effusion. Mediastinum/Nodes: No enlarged mediastinal, hilar, or axillary lymph nodes. Thyroid  gland, trachea, and esophagus demonstrate no significant findings. Lungs/Pleura: Mild diffuse bilateral bronchial wall thickening. Interval resolution of the majority of previously seen ground-glass airspace opacities throughout the chest however there is new irregular ground-glass airspace opacity scattered in the lung bases, particularly in the medial right lower lobe (series 7, image 119). No significant air trapping on expiratory phase imaging no pleural effusion or pneumothorax. Upper Abdomen: No acute abnormality.  Hepatic steatosis. Musculoskeletal: No chest wall abnormality. No acute osseous findings. IMPRESSION: 1. Interval resolution of the majority of previously seen ground-glass airspace opacities throughout the chest however there is new irregular ground-glass airspace opacity scattered in the lung bases, particularly in the medial right lower lobe. Fluctuant findings are consistent with ongoing infection or inflammation. No evidence of fibrotic interstitial lung disease. 2. Mild diffuse bilateral bronchial wall thickening, consistent with nonspecific infectious or inflammatory bronchitis. 3. Enlargement of the main pulmonary artery, as can be seen in pulmonary hypertension. 4.  Hepatic steatosis. Aortic Atherosclerosis (ICD10-I70.0). Electronically Signed   By: Marolyn JONETTA Jaksch M.D.   On: 09/22/2023 20:37   US  Abdomen Limited RUQ (LIVER/GB) Result Date: 08/31/2023 CLINICAL DATA:  Elevated liver function tests. History of fatty liver. EXAM: ULTRASOUND ABDOMEN LIMITED RIGHT UPPER QUADRANT COMPARISON:  Ultrasound 02/28/2022 FINDINGS: Gallbladder: Dilated gallbladder. No shadowing stones. No wall thickening or adjacent fluid. Common bile duct: Diameter: 2 mm Liver: Diffusely echogenic hepatic parenchyma consistent with fatty liver infiltration. Small hypoechoic area in the liver adjacent to the gallbladder fossa could be an area of fatty sparing. Portal vein is patent on color Doppler imaging with normal direction of blood flow towards the liver. Other: None. IMPRESSION: Fatty liver infiltration. Presumed area of fatty sparing next of the dilated gallbladder. No shadowing stones or ductal dilatation. Electronically Signed   By: Ranell Bring M.D.   On: 08/31/2023 09:49    Microbiology: Results for orders placed or performed during the hospital encounter of 09/23/23  Blood Culture (routine x 2)     Status: None   Collection Time: 09/23/23  8:05 PM   Specimen: Left Antecubital; Blood  Result Value Ref Range Status   Specimen Description LEFT ANTECUBITAL  Final   Special Requests   Final    BOTTLES DRAWN AEROBIC AND ANAEROBIC Blood Culture adequate volume   Culture   Final    NO GROWTH 5 DAYS Performed at [facility], [address], [place], [place] [address]    Report Status 09/28/2023 FINAL  Final  Blood Culture (routine x 2)     Status: None   Collection Time: 09/23/23  8:20 PM   Specimen: BLOOD LEFT HAND  Result Value Ref Range Status   Specimen Description BLOOD LEFT HAND  Final   Special Requests   Final    BOTTLES DRAWN AEROBIC AND ANAEROBIC Blood Culture adequate volume   Culture   Final    NO GROWTH 5 DAYS Performed at [facility], [address],  [place], [place] [address]    Report Status 09/28/2023 FINAL  Final  Resp panel by RT-PCR (RSV, Flu A&B, Covid) Anterior Nasal Swab     Status: None   Collection Time: 09/23/23  8:49 PM   Specimen: Anterior Nasal Swab  Result Value Ref Range Status   SARS Coronavirus 2 by RT PCR NEGATIVE NEGATIVE Final    Comment: (NOTE) SARS-CoV-2 target nucleic acids are NOT DETECTED.  The SARS-CoV-2 RNA is  generally detectable in upper respiratory specimens during the acute phase of infection. The lowest concentration of SARS-CoV-2 viral copies this assay can detect is 138 copies/mL. A negative result does not preclude SARS-Cov-2 infection and should not be used as the sole basis for treatment or other patient management decisions. A negative result may occur with  improper specimen collection/handling, submission of specimen other than nasopharyngeal swab, presence of viral mutation(s) within the areas targeted by this assay, and inadequate number of viral copies(<138 copies/mL). A negative result must be combined with clinical observations, patient history, and epidemiological information. The expected result is Negative.  Fact Sheet for Patients:  BloggerCourse.com  Fact Sheet for Healthcare Providers:  SeriousBroker.it  This test is no t yet approved or cleared by the [place]  FDA and  has been authorized for detection and/or diagnosis of SARS-CoV-2 by FDA under an Emergency Use Authorization (EUA). This EUA will remain  in effect (meaning this test can be used) for the duration of the COVID-19 declaration under Section 564(b)(1) of the Act, 21 U.S.C.section 360bbb-3(b)(1), unless the authorization is terminated  or revoked sooner.       Influenza A by PCR NEGATIVE NEGATIVE Final   Influenza B by PCR NEGATIVE NEGATIVE Final    Comment: (NOTE) The Xpert Xpress SARS-CoV-2/FLU/RSV plus assay is intended as an aid in the diagnosis of  influenza from Nasopharyngeal swab specimens and should not be used as a sole basis for treatment. Nasal washings and aspirates are unacceptable for Xpert Xpress SARS-CoV-2/FLU/RSV testing.  Fact Sheet for Patients: BloggerCourse.com  Fact Sheet for Healthcare Providers: SeriousBroker.it  This test is not yet approved or cleared by the [place]  FDA and has been authorized for detection and/or diagnosis of SARS-CoV-2 by FDA under an Emergency Use Authorization (EUA). This EUA will remain in effect (meaning this test can be used) for the duration of the COVID-19 declaration under Section 564(b)(1) of the Act, 21 U.S.C. section 360bbb-3(b)(1), unless the authorization is terminated or revoked.     Resp Syncytial Virus by PCR NEGATIVE NEGATIVE Final    Comment: (NOTE) Fact Sheet for Patients: BloggerCourse.com  Fact Sheet for Healthcare Providers: SeriousBroker.it  This test is not yet approved or cleared by the [place]  FDA and has been authorized for detection and/or diagnosis of SARS-CoV-2 by FDA under an Emergency Use Authorization (EUA). This EUA will remain in effect (meaning this test can be used) for the duration of the COVID-19 declaration under Section 564(b)(1) of the Act, 21 U.S.C. section 360bbb-3(b)(1), unless the authorization is terminated or revoked.  Performed at [facility], [address], [place], [place] [address]   MRSA Next Gen by PCR, Nasal     Status: None   Collection Time: 09/24/23  1:04 AM   Specimen: Nasal Mucosa; Nasal Swab  Result Value Ref Range Status   MRSA by PCR Next Gen NOT DETECTED NOT DETECTED Final    Comment: (NOTE) The GeneXpert MRSA Assay (FDA approved for NASAL specimens only), is one component of a comprehensive MRSA colonization surveillance program. It is not intended to diagnose MRSA infection nor to guide or monitor  treatment for MRSA infections. Test performance is not FDA approved in patients less than 56 years old. Performed at [facility], [address] [place], [place] [address]   Expectorated Sputum Assessment w Gram Stain, Rflx to Resp Cult     Status: None   Collection Time: 09/25/23  1:57 PM   Specimen: Expectorated Sputum  Result Value Ref  Range Status   Specimen Description EXPECTORATED SPUTUM  Final   Special Requests Normal  Final   Sputum evaluation   Final    THIS SPECIMEN IS ACCEPTABLE FOR SPUTUM CULTURE Performed at [facility], [address] [place], [place] [address]    Report Status 09/25/2023 FINAL  Final  Culture, Respiratory w Gram Stain     Status: None   Collection Time: 09/25/23  1:57 PM  Result Value Ref Range Status   Specimen Description   Final    EXPECTORATED SPUTUM Performed at [facility], [address], [place], [place] [address]    Special Requests   Final    Normal Reflexed from [id number] Performed at [facility], [address] [place], [place] [address]    Gram Stain   Final    RARE WBC SEEN RARE GRAM POSITIVE COCCI RARE GRAM NEGATIVE RODS    Culture   Final    Normal respiratory flora-no Staph aureus or Pseudomonas seen Performed at [facility], [address] [place], [place] [address]    Report Status 09/27/2023 FINAL  Final  Respiratory (~20 pathogens) panel by PCR     Status: Abnormal   Collection Time: 09/26/23  5:55 PM   Specimen: Nasopharyngeal Swab; Respiratory  Result Value Ref Range Status   Adenovirus NOT DETECTED NOT DETECTED Final   Coronavirus 229E NOT DETECTED NOT DETECTED Final    Comment: (NOTE) The Coronavirus on the Respiratory Panel, DOES NOT test for the novel  Coronavirus (2019 nCoV)    Coronavirus HKU1 NOT DETECTED NOT DETECTED Final   Coronavirus NL63 NOT DETECTED NOT DETECTED Final   Coronavirus OC43 NOT DETECTED NOT DETECTED Final   Metapneumovirus DETECTED (A) NOT DETECTED Final   Rhinovirus /  Enterovirus NOT DETECTED NOT DETECTED Final   Influenza A NOT DETECTED NOT DETECTED Final   Influenza B NOT DETECTED NOT DETECTED Final   Parainfluenza Virus 1 NOT DETECTED NOT DETECTED Final   Parainfluenza Virus 2 NOT DETECTED NOT DETECTED Final   Parainfluenza Virus 3 NOT DETECTED NOT DETECTED Final   Parainfluenza Virus 4 NOT DETECTED NOT DETECTED Final   Respiratory Syncytial Virus NOT DETECTED NOT DETECTED Final   Bordetella pertussis NOT DETECTED NOT DETECTED Final   Bordetella Parapertussis NOT DETECTED NOT DETECTED Final   Chlamydophila pneumoniae NOT DETECTED NOT DETECTED Final   Mycoplasma pneumoniae NOT DETECTED NOT DETECTED Final    Comment: Performed at [facility], [address] [place], [place] [address]    Labs: CBC: Recent Labs  Lab 09/23/23 2005 09/24/23 0227  WBC 7.5 6.6  NEUTROABS 5.7 5.6  HGB 14.3 13.1  HCT 40.7 38.3*  MCV 104.4* 105.5*  PLT 128* 118*   Basic Metabolic Panel: Recent Labs  Lab 09/24/23 0227 09/25/23 0439 09/26/23 0423 09/27/23 0443 09/28/23 0431 09/29/23 0421  NA 128* 134* 136 132* 134* 133*  K 3.9 4.1 3.9 4.3 4.3 4.3  CL 95* 102 104 106 105 101  CO2 19* 23 22 19* 22 24  GLUCOSE 126* 141* 115* 167* 150* 152*  BUN 13 17 20 19  21* 25*  CREATININE 0.79 0.80 0.65 0.76 0.72 0.81  CALCIUM  8.4* 8.7* 8.4* 8.4* 8.3* 8.4*  MG 1.4* 2.4  --   --   --  2.1  PHOS 2.2*  --   --   --   --  4.5   Liver Function Tests: Recent Labs  Lab 09/25/23 0439 09/26/23 0423 09/27/23 0443 09/28/23 0431 09/29/23  0421  AST 182* 164* 172* 159* 185*  ALT 70* 71* 89* 99* 123*  ALKPHOS 212* 187* 214* 209* 207*  BILITOT 3.9* 2.6* 2.8* 2.4* 2.8*  PROT 7.5 6.4* 6.8 6.8 6.6  ALBUMIN 2.7* 2.3* 2.4* 2.3* 2.4*   CBG: No results for input(s): GLUCAP in the last 168 hours.  Discharge time spent: greater than 30 minutes.  Signed: Alm Schneider, MD [facility] 09/29/2023

## 2023-09-29 NOTE — TOC Transition Note (Signed)
 Transition of Care [facility]) - Discharge Note  Patient Details  Name: Ryan Holland MRN: [id number] Date of Birth: [birth year]  Transition of Care [facility]) CM/SW Contact:  Nena LITTIE Coffee, RN Phone Number: 09/29/2023, 3:11 PM  Clinical Narrative:    Pt to dc home c/wife providing transportation. [employer] to deliver portable oxygen to the pt's room then provide home set-up at the residence.   Final next level of care: Home/Self Care Barriers to Discharge: Barriers Resolved  Patient Goals and CMS Choice  Discharge Placement  Discharge Plan and Services Additional resources added to the After Visit Summary for          DME Arranged: Oxygen DME Agency: [facility] Date DME Agency Contacted: 09/29/23   Representative spoke with at DME Agency: Alm Radiographer, therapeutic)  Social Drivers of Health (SDOH) Interventions SDOH Screenings   Food Insecurity: No Food Insecurity (09/24/2023)  Housing: Unknown (09/24/2023)  Transportation Needs: No Transportation Needs (09/24/2023)  Utilities: Not At Risk (09/24/2023)  Depression (PHQ2-9): Low Risk  (05/07/2020)  Social Connections: Moderately Isolated (09/24/2023)  Tobacco Use: Medium Risk (09/24/2023)     Readmission Risk Interventions    09/24/2023   11:58 AM  Readmission Risk Prevention Plan  Post Dischage Appt Complete  Medication Screening Complete  Transportation Screening Complete

## 2023-09-29 NOTE — Plan of Care (Signed)
  Problem: Education: Goal: Knowledge of General Education information will improve Description: Including pain rating scale, medication(s)/side effects and non-pharmacologic comfort measures Outcome: Progressing   Problem: Clinical Measurements: Goal: Diagnostic test results will improve Outcome: Progressing   Problem: Clinical Measurements: Goal: Respiratory complications will improve Outcome: Progressing   Problem: Coping: Goal: Level of anxiety will decrease Outcome: Progressing

## 2023-09-29 NOTE — Progress Notes (Signed)
 SATURATION QUALIFICATIONS: (This note is used to comply with regulatory documentation for home oxygen)   Patient Saturations on Room Air at Rest = 91   Patient Saturations on Room Air while Ambulating = 86   Patient Saturations on 2 Liters of oxygen while Ambulating = 95    Please briefly explain why patient needs home oxygen: To maintain 02 sat at 90% or above during ambulation.   Alm Schneider, DO

## 2023-09-29 NOTE — Progress Notes (Signed)
 Patient discharged home. Patient received O2 from [employer] at the bedside to take home. All questions and concerns answered. Patient has all personal belongings, including discharge paperwork. Patient left floor in wheelchair to POV.

## 2023-09-30 ENCOUNTER — Other Ambulatory Visit: Payer: Self-pay | Admitting: Medical

## 2023-09-30 DIAGNOSIS — G8929 Other chronic pain: Secondary | ICD-10-CM

## 2023-10-01 ENCOUNTER — Other Ambulatory Visit (HOSPITAL_COMMUNITY): Payer: Self-pay

## 2023-10-01 ENCOUNTER — Other Ambulatory Visit: Payer: Self-pay

## 2023-10-01 ENCOUNTER — Telehealth: Payer: Self-pay

## 2023-10-01 MED ORDER — BENZONATATE 100 MG PO CAPS
100.0000 mg | ORAL_CAPSULE | Freq: Three times a day (TID) | ORAL | 0 refills | Status: DC | PRN
  Filled 2023-10-01: qty 30, 10d supply, fill #0

## 2023-10-01 MED ORDER — METHOCARBAMOL 500 MG PO TABS
500.0000 mg | ORAL_TABLET | Freq: Three times a day (TID) | ORAL | 0 refills | Status: DC | PRN
Start: 2023-10-01 — End: 2023-10-26
  Filled 2023-10-01: qty 30, 10d supply, fill #0

## 2023-10-01 NOTE — Transitions of Care (Post Inpatient/ED Visit) (Signed)
 10/01/2023  Name: Ryan Holland MRN: [id number] DOB: 01-03-1971  Today's TOC FU Call Status: Today's TOC FU Call Status:: Successful TOC FU Call Completed TOC FU Call Complete Date: 10/01/23 Patient's Name and Date of Birth confirmed.  Transition Care Management Follow-up Telephone Call Date of Discharge: 09/29/23 [facility] Type of Discharge: Inpatient Admission Primary Inpatient Discharge Diagnosis:: Acute Hypoxic Respiratory Failure How have you been since you were released from the hospital?: Better Any questions or concerns?: No  Items Reviewed: Did you receive and understand the discharge instructions provided?: Yes Medications obtained,verified, and reconciled?: Yes (Medications Reviewed) Any new allergies since your discharge?: No Dietary orders reviewed?: NA Do you have support at home?: Yes People in Home [RPT]: spouse  Medications Reviewed Today: Medications Reviewed Today     Reviewed by Lavelle Charmaine NOVAK, LPN (Licensed Practical Nurse) on 10/01/23 at 1102  Med List Status: <None>   Medication Order Taking? Sig Documenting Provider Last Dose Status Informant  albuterol  (PROAIR  HFA) 108 (90 Base) MCG/ACT inhaler [id number] Yes Inhale 2 puffs into the lungs every 4 (four) hours as needed for wheezing or shortness of breath.  Patient taking differently: Inhale 1-2 puffs into the lungs every 4 (four) hours as needed for wheezing or shortness of breath.   Geronimo Amel, MD  Active Self, Pharmacy Records, Other, Multiple Informants  benzonatate  (TESSALON ) 100 MG capsule [id number] Yes Take 1 capsule (100 mg total) by mouth 3 (three) times daily as needed for cough. Saguier, Dallas, [name]  Active   cetirizine (ZYRTEC) 10 MG tablet [id number] Yes Take 10 mg by mouth daily. [provider]  Active Self, Pharmacy Records, Other, Multiple Informants  citalopram  (CELEXA ) 40 MG tablet [id number] Yes Take 1 tablet (40 mg total) by mouth daily.  Pavero, Lonni, [facility]  Active Self, Pharmacy Records, Other, Multiple Informants  diphenhydrAMINE (BENADRYL) 25 mg capsule [id number] Yes Take 50 mg by mouth in the morning. [provider]  Active Self, Pharmacy Records, Other, Multiple Informants  Dupilumab  (DUPIXENT ) 300 MG/2ML SOAJ [id number] Yes Inject 300 mg into the skin every 14 (fourteen) days. Jegede, Olugbemiga E, MD  Active Self, Pharmacy Records, Other, Multiple Informants  Evolocumab  (REPATHA  SURECLICK) 140 MG/ML SOAJ [id number] Yes Inject 140 mg into the skin every 14 (fourteen) days. Kate Lonni CROME, MD  Active Self, Pharmacy Records, Other, Multiple Informants  fluticasone  (FLONASE ) 50 MCG/ACT nasal spray [id number] Yes Place 2 sprays into both nostrils daily. Saguier, Dallas, [name]  Active Self, Pharmacy Records, Other, Multiple Informants  fluticasone  furoate-vilanterol (BREO ELLIPTA ) 200-25 MCG/ACT AEPB [id number] Yes Inhale 1 puff into the lungs daily. Geronimo Amel, MD  Active Self, Pharmacy Records, Other, Multiple Informants  methocarbamol  (ROBAXIN ) 500 MG tablet [id number] Yes Take 1 tablet by mouth every  8 hours as needed for muscle spams. Saguier, Dallas, [name]  Active   montelukast  (SINGULAIR ) 10 MG tablet [id number] Yes Take 1 tablet (10 mg total) by mouth daily. Saguier, Dallas, [name]  Active Self, Pharmacy Records, Other, Multiple Informants  pantoprazole  (PROTONIX ) 40 MG tablet [id number] Yes Take 1 tablet (40 mg total) by mouth daily. Saguier, Dallas, [name]  Active Self, Pharmacy Records, Other, Multiple Informants  predniSONE  (DELTASONE ) 10 MG tablet [id number] Yes Take 6 tablets (60 mg total) by mouth daily with breakfast. And decrease by one tablet daily Tat, David, MD  Active   predniSONE  (DELTASONE ) 10 MG tablet [id number] Yes Take 6 tablets (60 mg total) by mouth daily with breakfast. And decrease by one tablet daily  Evonnie Lenis, MD  Active             Home Care and Equipment/Supplies: Were Home  Health Services Ordered?: NA Any new equipment or medical supplies ordered?: NA  Functional Questionnaire: Do you need assistance with bathing/showering or dressing?: No Do you need assistance with meal preparation?: No Do you need assistance with eating?: No Do you have difficulty maintaining continence: No Do you need assistance with getting out of bed/getting out of a chair/moving?: No Do you have difficulty managing or taking your medications?: No  Follow up appointments reviewed: PCP Follow-up appointment confirmed?: Yes Date of PCP follow-up appointment?: 10/09/23 Follow-up Provider: [facility] Follow-up appointment confirmed?: NA Do you need transportation to your follow-up appointment?: No Do you understand care options if your condition(s) worsen?: Yes-patient verbalized understanding    SIGNATURE Charmaine Bloodgood, LPN [facility] Health Advisor [facility] [facility] You Are. We Are. [facility] Direct Dial [phone]

## 2023-10-09 ENCOUNTER — Encounter: Payer: Self-pay | Admitting: Medical

## 2023-10-09 ENCOUNTER — Other Ambulatory Visit (HOSPITAL_COMMUNITY): Payer: Self-pay

## 2023-10-09 ENCOUNTER — Ambulatory Visit: Admitting: Medical

## 2023-10-09 ENCOUNTER — Ambulatory Visit: Payer: Self-pay | Admitting: Medical

## 2023-10-09 ENCOUNTER — Other Ambulatory Visit (HOSPITAL_BASED_OUTPATIENT_CLINIC_OR_DEPARTMENT_OTHER): Payer: Self-pay

## 2023-10-09 VITALS — BP 130/80 | HR 77 | Resp 16 | Ht 70.0 in | Wt 272.6 lb

## 2023-10-09 DIAGNOSIS — D696 Thrombocytopenia, unspecified: Secondary | ICD-10-CM

## 2023-10-09 DIAGNOSIS — F1010 Alcohol abuse, uncomplicated: Secondary | ICD-10-CM

## 2023-10-09 DIAGNOSIS — R0609 Other forms of dyspnea: Secondary | ICD-10-CM | POA: Diagnosis not present

## 2023-10-09 DIAGNOSIS — J9601 Acute respiratory failure with hypoxia: Secondary | ICD-10-CM

## 2023-10-09 DIAGNOSIS — K760 Fatty (change of) liver, not elsewhere classified: Secondary | ICD-10-CM

## 2023-10-09 DIAGNOSIS — K7010 Alcoholic hepatitis without ascites: Secondary | ICD-10-CM

## 2023-10-09 DIAGNOSIS — Z8701 Personal history of pneumonia (recurrent): Secondary | ICD-10-CM | POA: Diagnosis not present

## 2023-10-09 DIAGNOSIS — J4540 Moderate persistent asthma, uncomplicated: Secondary | ICD-10-CM | POA: Diagnosis not present

## 2023-10-09 MED ORDER — ACAMPROSATE CALCIUM 333 MG PO TBEC
666.0000 mg | DELAYED_RELEASE_TABLET | Freq: Three times a day (TID) | ORAL | 0 refills | Status: DC
  Filled 2023-10-09 (×2): qty 180, 30d supply, fill #0

## 2023-10-09 MED ORDER — CLONAZEPAM 0.5 MG PO TABS
0.5000 mg | ORAL_TABLET | Freq: Every evening | ORAL | 0 refills | Status: AC | PRN
  Filled 2023-10-09 (×2): qty 10, 10d supply, fill #0

## 2023-10-09 NOTE — Progress Notes (Signed)
 Subjective:    Patient ID: Ryan Holland, male    DOB: [birth year], 53 y.o.   MRN: [id number]  HPI       Pt in for follow up from hospital.  Admit date:     09/23/2023  Discharge date: 09/29/23  Discharge Physician: Alm Tat    PCP: Dorina Loving, [name]    Recommendations at discharge:   Please follow up with primary care provider within 1-2 weeks  Please repeat BMP and CBC in one week         Hospital Course: 53 y.o. male with medical history significant for OSA on CPAP, obesity, asthma, alcohol  use disorder, who presents to the ER due to shortness of breath for the past 4 days.  Associated with a persistent cough.  Endorses subjective fevers and intermittent chills.   In the ER, febrile, hypoxic requiring 4 L nasal cannula to maintain O2 saturation above 92%, tachycardic, tachypneic.  CT angio chest was negative for acute pulmonary embolism however showed interval development of diffuse, lower lung zone predominant, peribronchovascular patchy airspace opacities.  Findings may represent infection/inflammation versus alveolar hemorrhage.   Additionally, the patient had a CT abdomen and pelvis with contrast which revealed mediastinal lymphadenopathy, likely reactive in etiology.  Splenomegaly.   Lab work was notable for elevated liver chemistries and T. bili of 6.9.  Admits to excessive alcohol  use.   Due to concern for possible multifocal CAP, the patient was started on Rocephin  and IV azithromycin .  He also received IV Solu-Medrol , and bronchodilator nebulizers in the ER.  Admitted by [facility], hospitalist service.     Assessment and Plan: Acute hypoxic respiratory failure secondary to Lobar pneumonia and asthma exacerbation   -Personally reviewed CT chest which showed bilateral lower lobe infiltrates/patchy opacities;  -CTA chest--no PE -Initially on 4 L to maintain O2 saturation above 92% -wean oxygen for saturation >92% - PCT 0.88 -Continue empiric IV ceftiraxone and  azithro--received 7 days ceftriaxone  and 6 days azithro during hospitalization -Continue IV Solu-Medrol  -added brovana  and pulmicort  -Incentive spirometer -added loratidine -viral respiratory panel+metapneumovirus -given lasix  IV 40 mg x 1 -d/c home with 2L oxygen as he desaturated to 86% on RA with ambulation   Acute asthma exacerbation -continue BDs as above -Continue Rocephin  and IV azithromycin  -Continued IV Solu-Medrol  -add hycodan -d/c home with prednisone  taper   Alcohol  abuse with concern for alcohol  withdrawal -CIWA protocol, added librium  taper as he reported 3 liters of alcohol  per day (vodka)  -Multivitamins, thiamine , folic acid  supplement Interested in alcohol  cessation - consult to [facility]  -finished librium  wean during hospitalization without further complications   Hepatic Steatosis  Alcoholic hepatitis -viral hepatitis panel neg -Avoid hepatotoxic agents. -LFTs largely stable -Pt advised that he will need to continue to follow up with GI outpatient    Hyperbilirubinemia, -CT abdomen pelvis with contrast was unrevealing, no biliary dilatation, no gallstone no focal liver abnormality -due to cholestasis -bili trending down -liver doppler--steatosis, no portal venous thrombus   Splenomegaly -Spleen measuring up to 17 cm.  No focal splenic lesion   Thrombocytopenia, mild -Platelet count 128 in the setting of alcohol  abuse/splenomegaly -viral resp panel +metapneumovirus -B12--1978 -folate--7.3>>replete   Non anion gap metabolic acidosis Serum bicarb 19, anion gap 14 Received LR fluid in ED -improved   Elevated troponin, suspect demand ischemia in the setting of hypoxia High-sensitivity troponin peaked at 20>>16 No evidence of acute ischemia on twelve-lead EKG -7/25 echo EF-- 60-65%, no WMA, normal RVF   Class III  Obesity BMI 40.46 -lifestyle modification   Dyslipidemia -- LDL 128, HDL 13, TC 234 -- atorvastatin  40 mg every evening    OSA CPAP  nightly Suspect he may have undiagnosed OHS   Hyponatremia -- sodium improved    Low cortisol -collected after pt was given IV steroids -dubious clinical significance   Medication List       STOP taking these medications     losartan  100 MG tablet Commonly known as: COZAAR     OVER THE COUNTER MEDICATION    OVER THE COUNTER MEDICATION    Wegovy  0.25 MG/0.5ML Soaj Generic drug: Semaglutide -Weight Management           TAKE these medications     albuterol  108 (90 Base) MCG/ACT inhaler Commonly known as: ProAir  HFA Inhale 2 puffs into the lungs every 4 (four) hours as needed for wheezing or shortness of breath. What changed: how much to take    benzonatate  100 MG capsule Commonly known as: TESSALON  Take 1 capsule (100 mg total) by mouth 3 (three) times daily as needed for cough.    Breo Ellipta  200-25 MCG/ACT Aepb Generic drug: fluticasone  furoate-vilanterol Inhale 1 puff into the lungs daily.    cetirizine 10 MG tablet Commonly known as: ZYRTEC Take 10 mg by mouth daily.    citalopram  40 MG tablet Commonly known as: CELEXA  Take 1 tablet (40 mg total) by mouth daily.    diphenhydrAMINE 25 mg capsule Commonly known as: BENADRYL Take 50 mg by mouth in the morning.    Dupixent  300 MG/2ML Soaj Generic drug: Dupilumab  Inject 300 mg into the skin every 14 (fourteen) days.    fluticasone  50 MCG/ACT nasal spray Commonly known as: FLONASE  Place 2 sprays into both nostrils daily.    methocarbamol  500 MG tablet Commonly known as: ROBAXIN  Take 1 tablet by mouth every  8 hours as needed for muscle spams.    montelukast  10 MG tablet Commonly known as: SINGULAIR  Take 1 tablet (10 mg total) by mouth daily.    pantoprazole  40 MG tablet Commonly known as: PROTONIX  Take 1 tablet (40 mg total) by mouth daily.    predniSONE  10 MG tablet Commonly known as: DELTASONE  Take 6 tablets (60 mg total) by mouth daily with breakfast. And decrease by one tablet daily     Repatha  SureClick 140 MG/ML Soaj Generic drug: Evolocumab  Inject 140 mg into the skin every 14 (fourteen) days.   Ryan Holland is a 53 year old male with asthma and alcohol  use disorder who presents with shortness of breath and recent hospitalization for pneumonia.  He experienced shortness of breath and difficulty breathing prior to his recent hospitalization, with oxygen levels dropping below 80% and a pulse rate of 144 bpm. He also had a low-grade fever and persistent cough. During his hospital stay, he was treated for multifocal community-acquired pneumonia with Rocephin , IV azithromycin , and Solu-Medrol . His oxygen levels improved to 92% on four liters of oxygen, and he was discharged on two liters of oxygen with an oxygen saturation of 86% on room air with ambulation.  He has a history of asthma and was treated with Brovana  and Pulmicort  during his hospital stay. He continues to use Breo inhaler once daily and is on Dupixent . He quit smoking in 2013 after smoking a pack a day for approximately 20 years.  He has a history of alcohol  use disorder and has abstained from alcohol  for 16 or 17 days since his hospitalization. He previously consumed up to three liters  of alcohol  and has been experiencing urges to drink, although most detox symptoms have subsided. He was noted to have a fatty liver during his hospital stay, and his bilirubin levels were trending downward. His platelets were slightly low at 128.  He uses CPAP for obstructive sleep apnea. During his hospital stay, his blood sugars were elevated, but his A1c was noted to be 4.2, indicating no chronic elevation. No fevers since discharge.  Breathing stable. o2 sat 98% on 2 liters 02  Review of Systems  Constitutional:  Negative for chills, fatigue and fever.  HENT:  Negative for congestion and mouth sores.   Respiratory:  Negative for cough, chest tightness, shortness of breath and wheezing.   Cardiovascular:  Negative for  chest pain and palpitations.  Gastrointestinal:  Negative for abdominal pain and blood in stool.  Genitourinary:  Negative for dysuria and frequency.  Musculoskeletal:  Negative for back pain, myalgias and neck pain.  Skin:  Negative for rash.  Neurological:  Negative for dizziness, speech difficulty, weakness and light-headedness.  Hematological:  Negative for adenopathy. Does not bruise/bleed easily.  Psychiatric/Behavioral:  Negative for behavioral problems, dysphoric mood and sleep disturbance. The patient is not hyperactive.     Past Medical History:  Diagnosis Date   Anxiety    GERD (gastroesophageal reflux disease)    OCCAS-WOULD USE OTC MED IF NEEDED   HNP (herniated nucleus pulposus), lumbar    PAIN IN LOWER BACK AND PAIN DOWN BOTH LEGS -12-24-14 radiates down right leg presently.   Hypertension    Sleep apnea    HAS CPAP-DOES NOT USE-DOES NOT KNOW SETTINGS     Social History   Socioeconomic History   Marital status: Married    Spouse name: Not on file   Number of children: Not on file   Years of education: Not on file   Highest education level: Not on file  Occupational History   Not on file  Tobacco Use   Smoking status: Former    Current packs/day: 0.00    Average packs/day: 2.0 packs/day for 20.0 years (40.0 ttl pk-yrs)    Types: Cigarettes    Start date: 10/05/1991    Quit date: 10/05/2011    Years since quitting: 12.0   Smokeless tobacco: Former    Types: Chew  Substance and Sexual Activity   Alcohol  use: Yes    Alcohol /week: 0.0 standard drinks of alcohol     Comment: 2 MIXED DRINKS (VODKA) DAILY-usually    Drug use: No   Sexual activity: Not on file  Other Topics Concern   Not on file  Social History Narrative   Not on file   Social Drivers of Health   Financial Resource Strain: Not on file  Food Insecurity: No Food Insecurity (09/24/2023)   Hunger Vital Sign    Worried About Running Out of Food in the Last Year: Never true    Ran Out of Food in  the Last Year: Never true  Transportation Needs: No Transportation Needs (09/24/2023)   PRAPARE - Administrator, Civil Service (Medical): No    Lack of Transportation (Non-Medical): No  Physical Activity: Not on file  Stress: Not on file  Social Connections: Moderately Isolated (09/24/2023)   Social Connection and Isolation Panel    Frequency of Communication with Friends and Family: Twice a week    Frequency of Social Gatherings with Friends and Family: Once a week    Attends Religious Services: Never    Production manager of [employer]  or Organizations: No    Attends Banker Meetings: Never    Marital Status: Married  Catering manager Violence: Not At Risk (09/24/2023)   Humiliation, Afraid, Rape, and Kick questionnaire    Fear of Current or Ex-Partner: No    Emotionally Abused: No    Physically Abused: No    Sexually Abused: No    Past Surgical History:  Procedure Laterality Date   01/05/2011 LUMBAR DECOMPRESSION AND MICRODISCECTOMY     CERVICAL FUSION  C4-5      ESOPHAGOGASTRODUODENOSCOPY (EGD) WITH PROPOFOL  N/A 12/30/2014   Procedure: ESOPHAGOGASTRODUODENOSCOPY (EGD) WITH PROPOFOL ;  Surgeon: Elsie Cree, MD;  Location: [facility];  Service: Endoscopy;  Laterality: N/A;  With Bravo   LUMBAR LAMINECTOMY/DECOMPRESSION MICRODISCECTOMY  07/26/2011   Procedure: LUMBAR LAMINECTOMY/DECOMPRESSION MICRODISCECTOMY;  Surgeon: [name], MD;  Location: [facility] ORS;  Service: Orthopedics;  Laterality: Right;  Re-Do Lumbar Decompression L5-S1 on Right,    PILONIDAL CYST EXCISION      Family History  Problem Relation Age of Onset   COPD Father    Congestive Heart Failure Father    Breast cancer Mother     Allergies  Allergen Reactions   Other     LEMON-sores in mouth    Current Outpatient Medications on File Prior to Visit  Medication Sig Dispense Refill   albuterol  (PROAIR  HFA) 108 (90 Base) MCG/ACT inhaler Inhale 2 puffs into the lungs every 4 (four) hours as  needed for wheezing or shortness of breath. (Patient taking differently: Inhale 1-2 puffs into the lungs every 4 (four) hours as needed for wheezing or shortness of breath.) 18 g 5   benzonatate  (TESSALON ) 100 MG capsule Take 1 capsule (100 mg total) by mouth 3 (three) times daily as needed for cough. 30 capsule 0   cetirizine (ZYRTEC) 10 MG tablet Take 10 mg by mouth daily.     citalopram  (CELEXA ) 40 MG tablet Take 1 tablet (40 mg total) by mouth daily. 90 tablet 0   diphenhydrAMINE (BENADRYL) 25 mg capsule Take 50 mg by mouth in the morning.     Dupilumab  (DUPIXENT ) 300 MG/2ML SOAJ Inject 300 mg into the skin every 14 (fourteen) days. 12 mL 1   Evolocumab  (REPATHA  SURECLICK) 140 MG/ML SOAJ Inject 140 mg into the skin every 14 (fourteen) days. 6 mL 1   fluticasone  (FLONASE ) 50 MCG/ACT nasal spray Place 2 sprays into both nostrils daily. 16 g 0   fluticasone  furoate-vilanterol (BREO ELLIPTA ) 200-25 MCG/ACT AEPB Inhale 1 puff into the lungs daily. 60 each 5   methocarbamol  (ROBAXIN ) 500 MG tablet Take 1 tablet by mouth every  8 hours as needed for muscle spams. 30 tablet 0   montelukast  (SINGULAIR ) 10 MG tablet Take 1 tablet (10 mg total) by mouth daily. 90 tablet 3   pantoprazole  (PROTONIX ) 40 MG tablet Take 1 tablet (40 mg total) by mouth daily. 90 tablet 3   predniSONE  (DELTASONE ) 10 MG tablet Take 6 tablets (60 mg total) by mouth daily with breakfast. And decrease by one tablet daily 21 tablet 0   No current facility-administered medications on file prior to visit.    BP 130/80   Pulse 77   Resp 16   Ht 5' 10 (1.778 m)   Wt 272 lb 9.6 oz (123.7 kg)   SpO2 98%   BMI 39.11 kg/m        Objective:   Physical Exam   General Mental Status- Alert. General Appearance- Not in acute distress.  Skin General: Color- Normal Color. Moisture- Normal Moisture.  Neck Carotid Arteries- Normal color. Moisture- Normal Moisture. No carotid bruits. No JVD.  Chest and Lung  Exam Auscultation: Breath Sounds:-Normal.  Cardiovascular Auscultation:Rythm- Regular. Murmurs & Other Heart Sounds:Auscultation of the heart reveals- No Murmurs.  Abdomen Inspection:-Inspeection Normal. Palpation/Percussion:Note:No mass. Palpation and Percussion of the abdomen reveal- Non Tender, Non Distended + BS, no rebound or guarding.  Neurologic Cranial Nerve exam:- CN III-XII intact(No nystagmus), symmetric smile. trength:- 5/5 equal and symmetric strength both upper and lower extremities.    Lower ext- calfs symmetric, negative homans signs, no pedal edema bilaterally.    Assessment & Plan:   Patient Instructions  Acute hypoxic respiratory failure secondary to multifocal pneumonia and asthma exacerbation Recently hospitalized for acute hypoxic respiratory failure due to multifocal pneumonia and asthma exacerbation. Discharged on home oxygen at 2 L/min.  - Continue home oxygen at 2 L/min. - Order chest x-ray for August 22nd or sooner if respiratory symptoms worsen. - Continue Breo inhaler once daily. - Follow up with pulmonologist on August 26th.  Alcohol  use disorder, currently abstinent, with recent withdrawal Abstinent for 16-17 days post-hospitalization. Experiencing anxiety and urge to drink. Discussed complications of continued alcohol  use. Acamprosate  prescribed to reduce urge to drink. - Prescribe acamprosate  to reduce urge to drink. - Consider clonazepam  0.5 mg at night if anxiety persists after 3-5 days on acamprosate .(rx given. not to drink alcohol  and use benzo together) - Monitor liver function tests and platelet count.  Fatty liver disease Fatty liver noted during hospitalization. Liver function tests stable, bilirubin levels trending downward due to abstinence. - Monitor liver function tests as part of metabolic panel.  Thrombocytopenia Platelet count low at 128 during hospitalization, possibly related to alcohol  use affecting liver function. - Order  CBC to monitor platelet count and assess for anemia.  Obstructive sleep apnea, on CPAP Continues to use CPAP for obstructive sleep apnea.  htn- bp well controlled and losartan  dc'd in hospital. Check bp daily. Notify me if bp trends upward to 140/90. If so likely restart bp med.  Follow up 11-06-2023 or sooner if needed   Jontay Maston, [name]    Time spent with patient today was 40  minutes which consisted of chart review, discussing diagnosis, work up treatment and documentation.

## 2023-10-09 NOTE — Patient Instructions (Signed)
 Acute hypoxic respiratory failure secondary to multifocal pneumonia and asthma exacerbation Recently hospitalized for acute hypoxic respiratory failure due to multifocal pneumonia and asthma exacerbation. Discharged on home oxygen at 2 L/min.  - Continue home oxygen at 2 L/min. - Order chest x-ray for August 22nd or sooner if respiratory symptoms worsen. - Continue Breo inhaler once daily. - Follow up with pulmonologist on August 26th.  Alcohol  use disorder, currently abstinent, with recent withdrawal Abstinent for 16-17 days post-hospitalization. Experiencing anxiety and urge to drink. Discussed complications of continued alcohol  use. Acamprosate  prescribed to reduce urge to drink. - Prescribe acamprosate  to reduce urge to drink. - Consider clonazepam  0.5 mg at night if anxiety persists after 3-5 days on acamprosate .(rx given. not to drink alcohol  and use benzo together) - Monitor liver function tests and platelet count.  Fatty liver disease Fatty liver noted during hospitalization. Liver function tests stable, bilirubin levels trending downward due to abstinence. - Monitor liver function tests as part of metabolic panel.  Thrombocytopenia Platelet count low at 128 during hospitalization, possibly related to alcohol  use affecting liver function. - Order CBC to monitor platelet count and assess for anemia.  Obstructive sleep apnea, on CPAP Continues to use CPAP for obstructive sleep apnea.  htn- bp well controlled and losartan  dc'd in hospital. Check bp daily. Notify me if bp trends upward to 140/90. If so likely restart bp med.  Follow up 11-06-2023 or sooner if needed

## 2023-10-10 LAB — COMPLETE METABOLIC PANEL WITHOUT GFR
AG Ratio: 1.2 (calc) (ref 1.0–2.5)
ALT: 99 U/L — ABNORMAL HIGH (ref 9–46)
AST: 93 U/L — ABNORMAL HIGH (ref 10–35)
Albumin: 3.4 g/dL — ABNORMAL LOW (ref 3.6–5.1)
Alkaline phosphatase (APISO): 224 U/L — ABNORMAL HIGH (ref 35–144)
BUN/Creatinine Ratio: 16 (calc) (ref 6–22)
BUN: 11 mg/dL (ref 7–25)
CO2: 26 mmol/L (ref 20–32)
Calcium: 9.3 mg/dL (ref 8.6–10.3)
Chloride: 101 mmol/L (ref 98–110)
Creat: 0.67 mg/dL — ABNORMAL LOW (ref 0.70–1.30)
Globulin: 2.9 g/dL (ref 1.9–3.7)
Glucose, Bld: 98 mg/dL (ref 65–99)
Potassium: 4.2 mmol/L (ref 3.5–5.3)
Sodium: 137 mmol/L (ref 135–146)
Total Bilirubin: 3.1 mg/dL — ABNORMAL HIGH (ref 0.2–1.2)
Total Protein: 6.3 g/dL (ref 6.1–8.1)

## 2023-10-10 LAB — CBC WITH DIFFERENTIAL/PLATELET
Absolute Lymphocytes: 1152 {cells}/uL (ref 850–3900)
Absolute Monocytes: 704 {cells}/uL (ref 200–950)
Basophils Absolute: 69 {cells}/uL (ref 0–200)
Basophils Relative: 1 %
Eosinophils Absolute: 214 {cells}/uL (ref 15–500)
Eosinophils Relative: 3.1 %
HCT: 39.8 % (ref 38.5–50.0)
Hemoglobin: 13.2 g/dL (ref 13.2–17.1)
MCH: 34.9 pg — ABNORMAL HIGH (ref 27.0–33.0)
MCHC: 33.2 g/dL (ref 32.0–36.0)
MCV: 105.3 fL — ABNORMAL HIGH (ref 80.0–100.0)
MPV: 10.6 fL (ref 7.5–12.5)
Monocytes Relative: 10.2 %
Neutro Abs: 4761 {cells}/uL (ref 1500–7800)
Neutrophils Relative %: 69 %
Platelets: 165 Thousand/uL (ref 140–400)
RBC: 3.78 Million/uL — ABNORMAL LOW (ref 4.20–5.80)
RDW: 12.2 % (ref 11.0–15.0)
Total Lymphocyte: 16.7 %
WBC: 6.9 Thousand/uL (ref 3.8–10.8)

## 2023-10-17 ENCOUNTER — Other Ambulatory Visit: Payer: Self-pay

## 2023-10-18 ENCOUNTER — Other Ambulatory Visit: Payer: Self-pay

## 2023-10-19 ENCOUNTER — Other Ambulatory Visit: Payer: Self-pay

## 2023-10-19 NOTE — Progress Notes (Signed)
 Specialty Pharmacy Refill Coordination Note  Ryan Holland is a 53 y.o. male contacted today regarding refills of specialty medication(s) Dupilumab  (Dupixent )   Patient requested Courier to Provider Office   Delivery date: 10/29/23   Verified address: [facility]   Medication will be filled on 10/29/23.

## 2023-10-26 ENCOUNTER — Other Ambulatory Visit: Payer: Self-pay

## 2023-10-26 ENCOUNTER — Other Ambulatory Visit: Payer: Self-pay | Admitting: Medical

## 2023-10-26 DIAGNOSIS — G8929 Other chronic pain: Secondary | ICD-10-CM

## 2023-10-27 ENCOUNTER — Other Ambulatory Visit: Payer: Self-pay

## 2023-10-27 ENCOUNTER — Other Ambulatory Visit (HOSPITAL_BASED_OUTPATIENT_CLINIC_OR_DEPARTMENT_OTHER): Payer: Self-pay

## 2023-10-27 MED ORDER — METHOCARBAMOL 500 MG PO TABS
500.0000 mg | ORAL_TABLET | Freq: Three times a day (TID) | ORAL | 0 refills | Status: DC | PRN
  Filled 2023-10-27: qty 30, 10d supply, fill #0

## 2023-10-27 NOTE — Telephone Encounter (Signed)
Rx robaxin refill sent to pharmacy.

## 2023-10-29 ENCOUNTER — Other Ambulatory Visit (HOSPITAL_BASED_OUTPATIENT_CLINIC_OR_DEPARTMENT_OTHER): Payer: Self-pay

## 2023-10-29 ENCOUNTER — Ambulatory Visit (HOSPITAL_BASED_OUTPATIENT_CLINIC_OR_DEPARTMENT_OTHER)
Admission: RE | Admit: 2023-10-29 | Discharge: 2023-10-29 | Disposition: A | Discharge status: Home / Self Care | Source: Ambulatory Visit | Attending: Medical | Admitting: Medical

## 2023-10-29 ENCOUNTER — Other Ambulatory Visit: Payer: Self-pay

## 2023-10-29 DIAGNOSIS — J984 Other disorders of lung: Secondary | ICD-10-CM | POA: Diagnosis not present

## 2023-10-29 DIAGNOSIS — Z8701 Personal history of pneumonia (recurrent): Secondary | ICD-10-CM | POA: Insufficient documentation

## 2023-10-29 DIAGNOSIS — J9690 Respiratory failure, unspecified, unspecified whether with hypoxia or hypercapnia: Secondary | ICD-10-CM | POA: Diagnosis not present

## 2023-10-29 DIAGNOSIS — J9601 Acute respiratory failure with hypoxia: Secondary | ICD-10-CM | POA: Diagnosis not present

## 2023-10-30 ENCOUNTER — Ambulatory Visit: Admitting: Internal Medicine

## 2023-10-30 ENCOUNTER — Encounter: Payer: Self-pay | Admitting: Internal Medicine

## 2023-10-30 VITALS — BP 120/66 | HR 82 | Ht 70.0 in | Wt 266.6 lb

## 2023-10-30 DIAGNOSIS — Z09 Encounter for follow-up examination after completed treatment for conditions other than malignant neoplasm: Secondary | ICD-10-CM | POA: Diagnosis not present

## 2023-10-30 DIAGNOSIS — J123 Human metapneumovirus pneumonia: Secondary | ICD-10-CM

## 2023-10-30 DIAGNOSIS — J9601 Acute respiratory failure with hypoxia: Secondary | ICD-10-CM | POA: Diagnosis not present

## 2023-10-30 DIAGNOSIS — R7989 Other specified abnormal findings of blood chemistry: Secondary | ICD-10-CM | POA: Diagnosis not present

## 2023-10-30 NOTE — Progress Notes (Signed)
 OV 11/20/2022  Subjective:-History from the patient and also review of the external medical record.  Patient ID: Ryan Holland, male , DOB: Jan 10, 1971 , age 53 y.o. , MRN: [id number] , ADDRESS: [id number] [place] [id number] PCP Saguier, [place] RIGGERS Patient Care Team: Saguier, Edward, [name] as PCP - General (Internal Medicine)  This Provider for this visit: Treatment Team:  Attending Provider: Geronimo Amel, MD    11/20/2022 -   Chief Complaint  Patient presents with   Pulmonary Consult    Referred by [place] Maxwell, PA. Pt states have had allergies forever- increased SOB since July 2024- gets winded sometimes just at rest and with minimal exertion. He has had cough- occ prod with clear sputum.      HPI Dorin Stooksbury 53 y.o. -works in [employer].  Following 40 pack smoking history but quit.  He tells me that he has a history of chronic cough for many years.  This is diagnosed secondary to allergies.  He used to follow with [facility].  Review extramedical records I am not able to find any allergy testing but he states allergy testing was positive.  It appears several years ago he had autoimmune profile the results are not available but I am presuming it is negative.  He was never on any asthma inhalers.  Since July 2024 he had insidious onset of shortness of breath associated with dizziness.  Dizziness happens randomly.  Shortness of breath is present with exertion relieved by rest.  After he went on Trelegy the shortness of breath did get better.  His baseline cough started going up.  Now for the last 1 or 2 weeks he has a dull pain in his right breast area.  He also has noticed some random tingling once or twice on his right arm.  The symptoms are associated with tachycardia particularly the dizziness.  He says even at rest he has tachycardia.  Sometimes the heart rate even at rest is 100-120 [90-99/min today at rest].   Review of the records  indicate that as of June 2024 he was doing okay.  Then on October 26, 2022 he did see primary care office.  For episodes of vertigo.  This is what he says the dizziness.  He points to the sinus area for this.  He was also having hypertension.  He did report his chronic cough.  At this visit he said the [facility] was not helping but today he did tell me the [facility] did help.  This subsequently resulted in a CT angiogram chest to rule out pulmonary embolism but did show a right sided groundglass opacities that I personally visualized and confirmed.  He also had Doppler lower extremities and this was negative.  He was given antibiotics both azithromycin  and Z-Pak.  Then on Labor Day weekend he was in the bass outlet at [place].  There when he went outside he got very tachycardic to a heart rate of 145 and also had blurred vision.  He did fall follow-up after this with primary care.  He is due dimer and BNP were normal.  Labs indicate that today exam nitric oxide  is normal [possible Breo effect].  His blood eosinophils are consistently high at 400 cells per cubic millimeter.  Primary care is considered but getting a referral to cardiology for Zio patch.  But he is also been referred here.  CT chest in 2016 was normal.  No ACE inhibitor intake.    X-rays hypoxemia test sit/stand  pulse ox at rest was 99% with a heart rate 90-99.  After sitting and standing 10 times he was short of breath 5 out of 10 pulse ox never changed heart rate went up to 110.  He does have sleep apnea he uses his CPAP but has not seen a sleep doctor in many years.   CT Chest data from date: 10/27/22  - personally visualized and independently interpreted : YES - my findings are: AGREE rrative & Impression  CLINICAL DATA:  Shortness of breath with positive D-dimer   EXAM: CT ANGIOGRAPHY CHEST WITH CONTRAST   TECHNIQUE: Multidetector CT imaging of the chest was performed using the standard protocol during bolus administration of  intravenous contrast. Multiplanar CT image reconstructions and MIPs were obtained to evaluate the vascular anatomy.   RADIATION DOSE REDUCTION: This exam was performed according to the departmental dose-optimization program which includes automated exposure control, adjustment of the mA and/or kV according to patient size and/or use of iterative reconstruction technique.   CONTRAST:  OMNIPAQUE  IOHEXOL  350 MG/ML SOLN   COMPARISON:  None Available.   FINDINGS: Cardiovascular: No evidence of pulmonary embolus. Normal heart size. No pericardial effusion. Normal caliber thoracic aorta with mild atherosclerotic disease. No coronary artery calcifications.   Mediastinum/Nodes: Esophagus thyroid  are unremarkable. No enlarged lymph nodes seen in the chest.   Lungs/Pleura: Central airways are patent. Patchy ground-glass opacities which are most pronounced in the right middle lobe and right upper lobe. No consolidation, pleural effusion or pneumothorax. Small solid pulmonary nodule of the right lower lobe measuring 4 mm on series 301, image 78.   Upper Abdomen: Hepatic steatosis.  No acute abnormality.   Musculoskeletal: No aggressive appearing osseous lesions.   Review of the MIP images confirms the above findings.   IMPRESSION: 1. No evidence of pulmonary embolus. 2. Patchy ground-glass opacities which are most pronounced in the right middle lobe and right upper lobe, likely infectious or inflammatory. 3. Small solid pulmonary nodule of the right lower lobe measuring 4 mm. No follow-up needed if patient is low-risk.This recommendation follows the consensus statement: Guidelines for Management of Incidental Pulmonary Nodules Detected on CT Images: From the Fleischner Society 2017; Radiology 2017; 284:228-243. 4. Hepatic steatosis. 5. Mild aortic Atherosclerosis (ICD10-I70.0).     Electronically Signed   By: Rea Marc M.D.   On: 10/27/2022 19:41     OV  01/12/2023  Subjective:  Patient ID: Ryan Holland, male , DOB: 04/18/1970 , age 82 y.o. , MRN: [id number] , ADDRESS: [address] [place] [id number] PCP Saguier, [place] [name] Patient Care Team: [name] RIGGERS as PCP - General (Internal Medicine) Kate Lonni CROME, MD as Consulting Physician (Cardiology)  This Provider for this visit: Treatment Team:  Attending Provider: Geronimo Amel, MD    01/12/2023 -   Chief Complaint  Patient presents with   Follow-up    Ct f/u & pft    Follow-up history of previous allergies but dizziness and worsening of with onset of chronic cough since July 2024: Workup in progress  HPI Shawn Carattini 53 y.o. -returns for follow-up to discuss current status and also discussed results.  Went over the history again he tells me that he has had a strong history of allergy issues.  At [facility] in 2017 skin test was positive for dust mite mold and mildew in fescue grass.  I am not able to get access to this but this is his history.  As a result of this he had seasonal  allergy symptoms especially in the spring and the fall.  This gets manifested by tickle in the throat, sinus drainage, runny eyes and red eyes.  Also some wheezing sebacic basically like hayfever.  He was on allergy shots.  In 2017 and 2020 and then stopped because of lack of change.  And then he was not on follow-up.  Most recently symptoms started in July 2024 when he started having dizziness with this the cough became ongoing.  He was has dry heaves and nausea with the cough this chest tightness there is also dizziness.  He said high risk for sleep apnea.  He had pulmonary infiltrates on CT in August 2024  Progress with issue so far -There is prior history of smoking 1 pack a day for 20 years.  - Pulmonary infiltrates on CT chest October 27, 2022: These are significantly improved on the high-resolution CT chest October 2024.  I personally visualized this and showed it to him.   He did have a subsequent CT coronary morphology on 01/08/2023.  No comment is made of the pulmonary infiltrates but I am wondering if it is back.  I am not so sure.  -Associated acid reflux symptoms: He occasionally has acid reflux but denies any overt aspiration.  -Limited allergy workup blood eosinophils and blood IgE is high   -Pulmonary function test suggest restriction for obesity but otherwise normal.  -Potential sleep apnea: Self has not seen a sleep doctor.  I am making a rereferral today.  He does use CPAP.  -Dizziness is seen Dr. Lonni Mas.  Most recent visit 01/03/2023.  Chart reviewed.  Echocardiogram in October 2024 was normal.  ZIO monitor recommended.  -He has morbid obesity 42.3 BMI         OV 10/30/2023  Subjective:  Patient ID: Ryan Holland, male , DOB: 02/19/1971 , age 74 y.o. , MRN: [id number] , ADDRESS: [address] [place] [id number] PCP Saguier, [place] [name] Patient Care Team: [name] RIGGERS as PCP - General (Internal Medicine) Kate Lonni CROME, MD as Consulting Physician (Cardiology)  This Provider for this visit: Treatment Team:  Attending Provider: Geronimo Amel, MD    10/30/2023 -   Chief Complaint  Patient presents with   Medical Management of Chronic Issues   Asthma    Breathing has been slightly worse over the past 2 wks. He has some wheezing and prod cough with clear to green sputum.     #Asthma with cough and pulm infiltrates not otherwise specified on biologic therapy #Respiratory failure admission July 2025 due to human metapneumovirus with elevated BNP  HPI Tresten Pantoja 53 y.o. -Here for follow-up.  Last November 2024.  He was supposed to have CT scan in July 2025 which he did have on July 18.  It showed some of the infiltrates were improved but he had new infiltrates but 2 days later he ended up in the hospital on October 01, 2023 with human metapneumovirus infection and elevated BNP.  CT scan of  the chest September 23, 2022 showed marked difference with bilateral bronchopneumonia consistent with viral infection.  He was in the hospital for 10 days discharged on steroids and on oxygen 2 L.  He says that he finished a week of steroids and then started feeling well for 2 weeks but now he is slowly coming back to all his baseline symptoms.  He still using his 2 L oxygen although when we turned it off he was normal on room air at rest and  he was able to walk 400 feet without any desaturation.  He has intentionally lost weight around 30 pounds.  In terms of his asthma he continues his asthma therapy with biologic Dupixent .  Main current issue is that his recent respiratory failure and hospitalization.  He did have a chest x-ray 2 days ago or yesterday and I personally visualized this.  It is improved compared to the hospital but there are still infiltrates.  Dr Felice Reflux Symptom Index (> 13-15 suggestive of LPR cough) 0 -> 5  =  none ->severe problem 01/12/2023  10/30/2023 Admite month ago for resp failure due to hMPV  Hoarseness of problem with voice 1 3  Clearing  Of Throat 4 3  Excess throat mucus or feeling of post nasal drip 4 1  Difficulty swallowing food, liquid or tablets 0 0  Cough after eating or lying down 5 2  Breathing difficulties or choking episodes 4 3  Troublesome or annoying cough 5 3  Sensation of something sticking in throat or lump in throat 2 1  Heartburn, chest pain, indigestion, or stomach acid coming up 4 1  TOTAL 29 17   Simple office walk 224 (66+46 x 2) feet Pod A at [facility] x  3 laps goal with forehead probe 10/30/2023    O2 used ra   Number laps completed 2 of 3   Comments about pace Regular pace   Resting Pulse Ox/HR 95% and 80/min   Final Pulse Ox/HR 94% and 99/min   Desaturated </= 88% no   Desaturated <= 3% points no   Got Tachycardic >/= 90/min yes   Symptoms at end of test Little dyspneic   Miscellaneous comments ok     \  PFT     Latest  Ref Rng & Units 01/12/2023    8:50 AM 09/10/2014    9:39 AM  PFT Results  FVC-Pre L 3.47  4.20   FVC-Predicted Pre % 68  80   FVC-Post L 3.43  3.94   FVC-Predicted Post % 67  75   Pre FEV1/FVC % % 87  85   Post FEV1/FCV % % 90  85   FEV1-Pre L 3.02  3.57   FEV1-Predicted Pre % 76  85   FEV1-Post L 3.07  3.37   DLCO uncorrected ml/min/mmHg 28.11  27.88   DLCO UNC% % 95  86   DLCO corrected ml/min/mmHg 28.11    DLCO COR %Predicted % 95    DLVA Predicted % 122  109   TLC L 5.63  5.84   TLC % Predicted % 80  84   RV % Predicted % 88  87        LAB RESULTS last 96 hours DG Chest 2 View Result Date: 10/29/2023 CLINICAL DATA:  History of pneumonia and respiratory failure. Follow-up. EXAM: CHEST - 2 VIEW COMPARISON:  09/23/2023 FINDINGS: Patchy airspace opacities in the mid and lower lungs, right greater than left. This is not significantly changed on the right, slightly improved on the left. No effusions or acute bony abnormality. IMPRESSION: Continued patchy bilateral airspace disease with some improvement on the right, stable on the left. Electronically Signed   By: Franky Crease M.D.   On: 10/29/2023 12:58         has a past medical history of Anxiety, GERD (gastroesophageal reflux disease), HNP (herniated nucleus pulposus), lumbar, Hypertension, and Sleep apnea.   reports that he quit smoking about 12 years ago. His smoking use included  cigarettes. He started smoking about 32 years ago. He has a 40 pack-year smoking history. He has quit using smokeless tobacco.  His smokeless tobacco use included chew.  Past Surgical History:  Procedure Laterality Date   01/05/2011 LUMBAR DECOMPRESSION AND MICRODISCECTOMY     CERVICAL FUSION  C4-5      ESOPHAGOGASTRODUODENOSCOPY (EGD) WITH PROPOFOL  N/A 12/30/2014   Procedure: ESOPHAGOGASTRODUODENOSCOPY (EGD) WITH PROPOFOL ;  Surgeon: Elsie Cree, MD;  Location: [facility];  Service: Endoscopy;  Laterality: N/A;  With Bravo   LUMBAR  LAMINECTOMY/DECOMPRESSION MICRODISCECTOMY  07/26/2011   Procedure: LUMBAR LAMINECTOMY/DECOMPRESSION MICRODISCECTOMY;  Surgeon: [name], MD;  Location: [facility];  Service: Orthopedics;  Laterality: Right;  Re-Do Lumbar Decompression L5-S1 on Right,    PILONIDAL CYST EXCISION      Allergies  Allergen Reactions   Other     LEMON-sores in mouth    Immunization History  Administered Date(s) Administered   Influenza Inj Mdck Quad Pf 11/22/2016   Influenza Whole 12/24/2013, 11/07/2014   Influenza, Seasonal, Injecte, Preservative Fre 11/20/2022   Influenza,inj,Quad PF,6+ Mos 11/08/2017, 01/27/2019, 02/20/2022   PFIZER(Purple Top)SARS-COV-2 Vaccination 06/07/2019, 07/08/2019   PNEUMOCOCCAL CONJUGATE-20 08/14/2023   Tdap 11/22/2016   Zoster Recombinant(Shingrix ) 08/14/2023    Family History  Problem Relation Age of Onset   COPD Father    Congestive Heart Failure Father    Breast cancer Mother      Current Outpatient Medications:    albuterol  (PROAIR  HFA) 108 (90 Base) MCG/ACT inhaler, Inhale 2 puffs into the lungs every 4 (four) hours as needed for wheezing or shortness of breath., Disp: 18 g, Rfl: 5   benzonatate  (TESSALON ) 100 MG capsule, Take 1 capsule (100 mg total) by mouth 3 (three) times daily as needed for cough., Disp: 30 capsule, Rfl: 0   cholecalciferol (VITAMIN D3) 25 MCG (1000 UNIT) tablet, Take 1,000 Units by mouth as directed., Disp: , Rfl:    citalopram  (CELEXA ) 40 MG tablet, Take 1 tablet (40 mg total) by mouth daily., Disp: 90 tablet, Rfl: 0   clonazePAM  (KLONOPIN ) 0.5 MG tablet, Take 1 tablet (0.5 mg total) by mouth at bedtime as needed anxiety and insomnia, Disp: 10 tablet, Rfl: 0   diphenhydrAMINE (BENADRYL) 25 mg capsule, Take 50 mg by mouth in the morning., Disp: , Rfl:    Dupilumab  (DUPIXENT ) 300 MG/2ML SOAJ, Inject 300 mg into the skin every 14 (fourteen) days., Disp: 12 mL, Rfl: 1   Evolocumab  (REPATHA  SURECLICK) 140 MG/ML SOAJ, Inject 140 mg into the skin  every 14 (fourteen) days., Disp: 6 mL, Rfl: 1   fluticasone  (FLONASE ) 50 MCG/ACT nasal spray, Place 2 sprays into both nostrils daily., Disp: 16 g, Rfl: 0   fluticasone  furoate-vilanterol (BREO ELLIPTA ) 200-25 MCG/ACT AEPB, Inhale 1 puff into the lungs daily., Disp: 60 each, Rfl: 5   methocarbamol  (ROBAXIN ) 500 MG tablet, Take 1 tablet by mouth every  8 hours as needed for muscle spams., Disp: 30 tablet, Rfl: 0   montelukast  (SINGULAIR ) 10 MG tablet, Take 1 tablet (10 mg total) by mouth daily., Disp: 90 tablet, Rfl: 3   pantoprazole  (PROTONIX ) 40 MG tablet, Take 1 tablet (40 mg total) by mouth daily., Disp: 90 tablet, Rfl: 3   acamprosate  (CAMPRAL ) 333 MG tablet, Take 2 tablets (666 mg total) by mouth 3 (three) times daily with meals. (Patient not taking: Reported on 10/30/2023), Disp: 180 tablet, Rfl: 0   predniSONE  (DELTASONE ) 10 MG tablet, Take 6 tablets (60 mg total) by mouth daily with breakfast.  And decrease by one tablet daily, Disp: 21 tablet, Rfl: 0      Objective:   Vitals:   10/30/23 1451  BP: 120/66  Pulse: 82  SpO2: 98%  Weight: 266 lb 9.6 oz (120.9 kg)  Height: 5' 10 (1.778 m)    Estimated body mass index is 38.25 kg/m as calculated from the following:   Height as of this encounter: 5' 10 (1.778 m).   Weight as of this encounter: 266 lb 9.6 oz (120.9 kg).  @WEIGHTCHANGE @  [employer]   10/30/23 1451  Weight: 266 lb 9.6 oz (120.9 kg)     Physical Exam   General: No distress. obese O2 at rest: yes but does not need it Cane present: no Sitting in wheel chair: no Frail: no Obese: no Neuro: Alert and Oriented x 3. GCS 15. Speech normal Psych: Pleasant Resp:  Barrel Chest - no.  Wheeze - no, Crackles - no, No overt respiratory distress CVS: Normal heart sounds. Murmurs - no Ext: Stigmata of Connective Tissue Disease - no HEENT: Normal upper airway. PEERL +. No post nasal drip        Assessment/     Assessment & Plan Hospital discharge  follow-up  Acute hypoxic respiratory failure (HCC)  Elevated brain natriuretic peptide (BNP) level  Bronchopneumonia due to human metapneumovirus (hMPV)    PLAN Patient Instructions  Hospital discharge follow-up Acute hypoxic respiratory failure (HCC) Elevated brain natriuretic peptide (BNP) level Bronchopneumonia due to human metapneumovirus (hMPV)  - cxr 10/29/23 with partial improvement only; concern you might have BOOP (post viral)  Plan  - check bnp - check ESR - based on results more lasix  or redo prednisone   - will call with results; if no response let us  know - we will have to follow infltrates to resolution with CT chest in 10 weeks - no need for o2 at rest  Chronic cough DOE (dyspnea on exertion) Ground glass opacity present on imaging of lung Eosinophilia, unspecified type History of seasonal allergies Asthma diagnosis :   Asthma diagnosis based on history of seasonal allergies, partial improvement with Breo and elevated blood eosinophils  - Currently stable   Plan  -  COtninue  Breo and take it regularly - continue singulari scheduled - Continue albuterol  as needed -control GERD - continue  Dupixent      History of obstructive sleep apnea Morbid obesity  - congrast on weight loss  Plan Continue cPAP  Per Dr MALVA and PCP Saguier, [place], [name]     Follow-up - Return to see Dr. Geronimo or nurse practitioner in 10 weeks but after completing all of the above.    FOLLOWUP    Return in about 10 weeks (around 01/08/2024) for 15 min visit, with Dr Geronimo, with any of the APPS, Face to Face Visit.    SIGNATURE    Dr. Dorethia Geronimo, M.D., F.C.C.P,  Pulmonary and Critical Care Medicine Staff Physician, [facility] Center Director - Interstitial Lung Disease  Program  Pulmonary Fibrosis [facility] [place], [place], [address]  Pager: [phone], If no answer or between  15:00h - 7:00h: call  [phone] Telephone: [phone]  3:26 PM 10/30/2023

## 2023-10-30 NOTE — Patient Instructions (Addendum)
 Hospital discharge follow-up Acute hypoxic respiratory failure (HCC) Elevated brain natriuretic peptide (BNP) level Bronchopneumonia due to human metapneumovirus (hMPV)  - cxr 10/29/23 with partial improvement only; concern you might have BOOP (post viral)  Plan  - check bnp - check ESR - based on results more lasix  or redo prednisone   - will call with results; if no response let us  know - we will have to follow infltrates to resolution with CT chest in 10 weeks - no need for o2 at rest  Chronic cough DOE (dyspnea on exertion) Ground glass opacity present on imaging of lung Eosinophilia, unspecified type History of seasonal allergies Asthma diagnosis :   Asthma diagnosis based on history of seasonal allergies, partial improvement with Breo and elevated blood eosinophils  - Currently stable   Plan  -  COtninue  Breo and take it regularly - continue singulari scheduled - Continue albuterol  as needed -control GERD - continue  Dupixent      History of obstructive sleep apnea Morbid obesity  - congrast on weight loss  Plan Continue cPAP  Per Dr MALVA and PCP Saguier, [place], [name]     Follow-up - Return to see Dr. Geronimo or nurse practitioner in 10 weeks but after completing all of the above.

## 2023-10-31 LAB — SEDIMENTATION RATE: Sed Rate: 42 mm/h — ABNORMAL HIGH (ref 0–20)

## 2023-10-31 LAB — BRAIN NATRIURETIC PEPTIDE: Pro B Natriuretic peptide (BNP): 34 pg/mL (ref 0.0–100.0)

## 2023-11-06 ENCOUNTER — Encounter: Payer: Self-pay | Admitting: Internal Medicine

## 2023-11-06 ENCOUNTER — Other Ambulatory Visit: Payer: Self-pay

## 2023-11-06 ENCOUNTER — Other Ambulatory Visit: Payer: Self-pay | Admitting: Medical

## 2023-11-06 ENCOUNTER — Telehealth: Payer: Self-pay

## 2023-11-06 ENCOUNTER — Other Ambulatory Visit (HOSPITAL_COMMUNITY): Payer: Self-pay

## 2023-11-06 MED ORDER — BENZONATATE 100 MG PO CAPS
100.0000 mg | ORAL_CAPSULE | Freq: Three times a day (TID) | ORAL | 0 refills | Status: AC | PRN
  Filled 2023-11-06: qty 30, 10d supply, fill #0

## 2023-11-06 NOTE — Telephone Encounter (Signed)
 Dr Geronimo,   Please advise the Sed Rate for pt. See mychart messages.

## 2023-11-06 NOTE — Telephone Encounter (Signed)
 Was entertaining a condition called BOOP or heart failure.  The BNP is normal.  The ESR is extremely high greater than 80 in the case of BOOP but here it is 40 and it is in the intermediate zone.  Nevertheless I think it is worth giving a 4-week prednisone  course [his blood sugar can go up blood pressure can go up   Plan  - - Start prednisone  -40 mg once daily for 1 weeks and then 30 mg once daily for 1 weeks and then 20 mg once daily for 1 weeks and then 10 mg once daily for 1 weeks then 5 mg once daily for 1 weeks and then 5 mg on Monday Wednesday Friday for 1 weeks and stop (Total 6 week course,   If he has no objections please send the prescription  He will need to keep follow-up

## 2023-11-07 ENCOUNTER — Other Ambulatory Visit (HOSPITAL_BASED_OUTPATIENT_CLINIC_OR_DEPARTMENT_OTHER): Payer: Self-pay

## 2023-11-07 ENCOUNTER — Other Ambulatory Visit: Payer: Self-pay

## 2023-11-07 MED ORDER — PREDNISONE 10 MG PO TABS
ORAL_TABLET | ORAL | 0 refills | Status: DC
  Filled 2023-11-07: qty 75, 42d supply, fill #0

## 2023-11-07 NOTE — Telephone Encounter (Signed)
 Pt notified and rx sent to pharmacy

## 2023-11-08 ENCOUNTER — Other Ambulatory Visit: Payer: Self-pay

## 2023-11-19 ENCOUNTER — Other Ambulatory Visit (HOSPITAL_COMMUNITY): Payer: Self-pay

## 2023-11-20 ENCOUNTER — Other Ambulatory Visit (HOSPITAL_COMMUNITY): Payer: Self-pay

## 2023-11-20 ENCOUNTER — Other Ambulatory Visit: Payer: Self-pay | Admitting: Pharmacy Technician

## 2023-11-20 ENCOUNTER — Telehealth: Payer: Self-pay

## 2023-11-20 ENCOUNTER — Other Ambulatory Visit: Payer: Self-pay

## 2023-11-20 NOTE — Telephone Encounter (Signed)
 Received a message from [employer] that Dupixent  copay card is rejecting for cardholder ID not matched. Sent a message to pt's wife Ryan Holland per note on the request that he probably needs to have the card renewed. She said she will call them and follow up.

## 2023-11-21 ENCOUNTER — Other Ambulatory Visit: Payer: Self-pay

## 2023-11-22 ENCOUNTER — Other Ambulatory Visit: Payer: Self-pay

## 2023-11-22 ENCOUNTER — Other Ambulatory Visit: Payer: Self-pay | Admitting: Medical

## 2023-11-22 ENCOUNTER — Other Ambulatory Visit (HOSPITAL_COMMUNITY): Payer: Self-pay

## 2023-11-22 ENCOUNTER — Other Ambulatory Visit: Payer: Self-pay | Admitting: Pharmacist

## 2023-11-22 DIAGNOSIS — G8929 Other chronic pain: Secondary | ICD-10-CM

## 2023-11-22 DIAGNOSIS — F339 Major depressive disorder, recurrent, unspecified: Secondary | ICD-10-CM

## 2023-11-22 MED ORDER — METHOCARBAMOL 500 MG PO TABS
500.0000 mg | ORAL_TABLET | Freq: Three times a day (TID) | ORAL | 0 refills | Status: DC | PRN
  Filled 2023-11-22: qty 30, 10d supply, fill #0

## 2023-11-22 NOTE — Progress Notes (Signed)
 Specialty Pharmacy Refill Coordination Note  Spoke with Lissa Sawyer (Wife, via Teams)  Ryan Holland is a 53 y.o. male contacted today regarding refills of specialty medication(s) Dupilumab  (Dupixent )  Doses on hand: 0  Injection date: 12/06/23   Patient requested: -- ([facility] Lissa Sawyer)   Delivery date: 11/26/23   Verified address: [facility] [employer] [address] [place] [address]  Medication will be filled on 11/23/23.

## 2023-11-23 ENCOUNTER — Other Ambulatory Visit (HOSPITAL_COMMUNITY): Payer: Self-pay

## 2023-11-23 ENCOUNTER — Other Ambulatory Visit: Payer: Self-pay

## 2023-11-29 ENCOUNTER — Encounter (HOSPITAL_COMMUNITY): Payer: Self-pay

## 2023-11-30 ENCOUNTER — Other Ambulatory Visit: Payer: Self-pay

## 2023-11-30 ENCOUNTER — Other Ambulatory Visit (HOSPITAL_COMMUNITY): Payer: Self-pay

## 2023-11-30 MED ORDER — CITALOPRAM HYDROBROMIDE 40 MG PO TABS
40.0000 mg | ORAL_TABLET | Freq: Every day | ORAL | 0 refills | Status: DC
  Filled 2023-11-30: qty 90, 90d supply, fill #0

## 2023-12-02 ENCOUNTER — Other Ambulatory Visit: Payer: Self-pay | Admitting: Pharmacist

## 2023-12-02 ENCOUNTER — Other Ambulatory Visit: Payer: Self-pay | Admitting: Internal Medicine

## 2023-12-02 DIAGNOSIS — F339 Major depressive disorder, recurrent, unspecified: Secondary | ICD-10-CM

## 2023-12-03 ENCOUNTER — Other Ambulatory Visit: Payer: Self-pay

## 2023-12-03 MED ORDER — ALBUTEROL SULFATE HFA 108 (90 BASE) MCG/ACT IN AERS
2.0000 | INHALATION_SPRAY | RESPIRATORY_TRACT | 5 refills | Status: DC | PRN
  Filled 2023-12-03: qty 18, 17d supply, fill #0

## 2023-12-04 ENCOUNTER — Other Ambulatory Visit: Payer: Self-pay

## 2023-12-04 ENCOUNTER — Other Ambulatory Visit (HOSPITAL_COMMUNITY): Payer: Self-pay

## 2023-12-10 ENCOUNTER — Ambulatory Visit: Admitting: Dermatology

## 2023-12-10 ENCOUNTER — Encounter: Payer: Self-pay | Admitting: Dermatology

## 2023-12-10 VITALS — BP 103/73 | HR 105

## 2023-12-10 DIAGNOSIS — D0339 Melanoma in situ of other parts of face: Secondary | ICD-10-CM | POA: Diagnosis not present

## 2023-12-10 DIAGNOSIS — W908XXA Exposure to other nonionizing radiation, initial encounter: Secondary | ICD-10-CM | POA: Diagnosis not present

## 2023-12-10 DIAGNOSIS — L308 Other specified dermatitis: Secondary | ICD-10-CM | POA: Diagnosis not present

## 2023-12-10 DIAGNOSIS — L280 Lichen simplex chronicus: Secondary | ICD-10-CM | POA: Diagnosis not present

## 2023-12-10 DIAGNOSIS — Z1283 Encounter for screening for malignant neoplasm of skin: Secondary | ICD-10-CM | POA: Diagnosis not present

## 2023-12-10 DIAGNOSIS — L814 Other melanin hyperpigmentation: Secondary | ICD-10-CM | POA: Diagnosis not present

## 2023-12-10 DIAGNOSIS — D039 Melanoma in situ, unspecified: Secondary | ICD-10-CM

## 2023-12-10 DIAGNOSIS — D485 Neoplasm of uncertain behavior of skin: Secondary | ICD-10-CM

## 2023-12-10 DIAGNOSIS — L570 Actinic keratosis: Secondary | ICD-10-CM

## 2023-12-10 DIAGNOSIS — L821 Other seborrheic keratosis: Secondary | ICD-10-CM | POA: Diagnosis not present

## 2023-12-10 DIAGNOSIS — D1801 Hemangioma of skin and subcutaneous tissue: Secondary | ICD-10-CM

## 2023-12-10 DIAGNOSIS — L578 Other skin changes due to chronic exposure to nonionizing radiation: Secondary | ICD-10-CM

## 2023-12-10 DIAGNOSIS — D229 Melanocytic nevi, unspecified: Secondary | ICD-10-CM

## 2023-12-10 HISTORY — DX: Melanoma in situ, unspecified: D03.9

## 2023-12-10 NOTE — Patient Instructions (Addendum)
 Patient Handout: Wound Care for Skin Biopsy Site  Taking Care of Your Skin Biopsy Site  Proper care of the biopsy site is essential for promoting healing and minimizing scarring. This handout provides instructions on how to care for your biopsy site to ensure optimal recovery.  1. Cleaning the Wound:  Clean the biopsy site daily with gentle soap and water. Gently pat the area dry with a clean, soft towel. Avoid harsh scrubbing or rubbing the area, as this can irritate the skin and delay healing.  2. Applying Aquaphor and Bandage:  After cleaning the wound, apply a thin layer of Aquaphor ointment to the biopsy site. Cover the area with a sterile bandage to protect it from dirt, bacteria, and friction. Change the bandage daily or as needed if it becomes soiled or wet.  3. Continued Care for One Week:  Repeat the cleaning, Aquaphor application, and bandaging process daily for one week following the biopsy procedure. Keeping the wound clean and moist during this initial healing period will help prevent infection and promote optimal healing.  4. Massaging Aquaphor into the Area:  ---After one week, discontinue the use of bandages but continue to apply Aquaphor to the biopsy site. ----Gently massage the Aquaphor into the area using circular motions. ---Massaging the skin helps to promote circulation and prevent the formation of scar tissue.   Additional Tips:  Avoid exposing the biopsy site to direct sunlight during the healing process, as this can cause hyperpigmentation or worsen scarring. If you experience any signs of infection, such as increased redness, swelling, warmth, or drainage from the wound, contact your healthcare provider immediately. Follow any additional instructions provided by your healthcare provider for caring for the biopsy site and managing any discomfort. Conclusion:  Taking proper care of your skin biopsy site is crucial for ensuring optimal healing and  minimizing scarring. By following these instructions for cleaning, applying Aquaphor, and massaging the area, you can promote a smooth and successful recovery. If you have any questions or concerns about caring for your biopsy site, don't hesitate to contact your healthcare provider for guidance.   For areas treated with Liquid Nitrogen:  Keep clean with soap and water.  Apply Vaseline or Aquaphor twice daily.  Important Information  Due to recent changes in healthcare laws, you may see results of your pathology and/or laboratory studies on MyChart before the doctors have had a chance to review them. We understand that in some cases there may be results that are confusing or concerning to you. Please understand that not all results are received at the same time and often the doctors may need to interpret multiple results in order to provide you with the best plan of care or course of treatment. Therefore, we ask that you please give us  2 business days to thoroughly review all your results before contacting the office for clarification. Should we see a critical lab result, you will be contacted sooner.   If You Need Anything After Your Visit  If you have any questions or concerns for your doctor, please call our main line at [phone] If no one answers, please leave a voicemail as directed and we will return your call as soon as possible. Messages left after 4 pm will be answered the following business day.   You may also send us  a message via MyChart. We typically respond to MyChart messages within 1-2 business days.  For prescription refills, please ask your pharmacy to contact our office. Our fax number is  [phone].  If you have an urgent issue when the clinic is closed that cannot wait until the next business day, you can page your doctor at the number below.    Please note that while we do our best to be available for urgent issues outside of office hours, we are not available 24/7.    If you have an urgent issue and are unable to reach us , you may choose to seek medical care at your doctor's office, retail clinic, urgent care center, or emergency room.  If you have a medical emergency, please immediately call 911 or go to the emergency department. In the event of inclement weather, please call our main line at [phone] for an update on the status of any delays or closures.  Dermatology Medication Tips: Please keep the boxes that topical medications come in in order to help keep track of the instructions about where and how to use these. Pharmacies typically print the medication instructions only on the boxes and not directly on the medication tubes.   If your medication is too expensive, please contact our office at [phone] or send us  a message through MyChart.   We are unable to tell what your co-pay for medications will be in advance as this is different depending on your insurance coverage. However, we may be able to find a substitute medication at lower cost or fill out paperwork to get insurance to cover a needed medication.   If a prior authorization is required to get your medication covered by your insurance company, please allow us  1-2 business days to complete this process.  Drug prices often vary depending on where the prescription is filled and some pharmacies may offer cheaper prices.  The website www.goodrx.com contains coupons for medications through different pharmacies. The prices here do not account for what the cost may be with help from insurance (it may be cheaper with your insurance), but the website can give you the price if you did not use any insurance.  - You can print the associated coupon and take it with your prescription to the pharmacy.  - You may also stop by our office during regular business hours and pick up a GoodRx coupon card.  - If you need your prescription sent electronically to a different pharmacy, notify our office  through [facility] or by phone at [phone]    Skin Education :   I counseled the patient regarding the following: Sun screen (SPF 30 or greater) should be applied during peak UV exposure (between 10am and 2pm) and reapplied after exercise or swimming.  The ABCDEs of melanoma were reviewed with the patient, and the importance of monthly self-examination of moles was emphasized. Should any moles change in shape or color, or itch, bleed or burn, pt will contact our office for evaluation sooner then their interval appointment.  Plan: Sunscreen Recommendations I recommended a broad spectrum sunscreen with a SPF of 30 or higher. I explained that SPF 30 sunscreens block approximately 97 percent of the sun's harmful rays. Sunscreens should be applied at least 15 minutes prior to expected sun exposure and then every 2 hours after that as long as sun exposure continues. If swimming or exercising sunscreen should be reapplied every 45 minutes to an hour after getting wet or sweating. One ounce, or the equivalent of a shot glass full of sunscreen, is adequate to protect the skin not covered by a bathing suit. I also recommended a lip balm with a sunscreen as  well. Sun protective clothing can be used in lieu of sunscreen but must be worn the entire time you are exposed to the sun's rays.

## 2023-12-10 NOTE — Progress Notes (Signed)
 New Patient Visit   Subjective  Ryan Holland is a 53 y.o. male who presents for the following: Skin Cancer Screening and Full Body Skin Exam  The patient presents for Total-Body Skin Exam (TBSE) for skin cancer screening and mole check. The patient has spots, moles and lesions to be evaluated, some may be new or changing.  The following portions of the chart were reviewed this encounter and updated as appropriate: medications, allergies, medical history  Review of Systems:  No other skin or systemic complaints except as noted in HPI or Assessment and Plan.  Objective  Well appearing patient in no apparent distress; mood and affect are within normal limits.  A full examination was performed including scalp, head, eyes, ears, nose, lips, neck, chest, axillae, abdomen, back, buttocks, bilateral upper extremities, bilateral lower extremities, hands, feet, fingers, toes, fingernails, and toenails. All findings within normal limits unless otherwise noted below.   Relevant physical exam findings are noted in the Assessment and Plan.  Right Thigh - Anterior Erythematous thin papules/macules with gritty scale.  Left Lower Leg - Anterior 4x3.6 pink nonscaly plaque  Left Preauricular Area 1cm irregularly shaped brown patch    Assessment & Plan   SKIN CANCER SCREENING PERFORMED TODAY.  ACTINIC DAMAGE - Chronic condition, secondary to cumulative UV/sun exposure - diffuse scaly erythematous macules with underlying dyspigmentation - Recommend daily broad spectrum sunscreen SPF 30+ to sun-exposed areas, reapply every 2 hours as needed.  - Staying in the shade or wearing long sleeves, sun glasses (UVA+UVB protection) and wide brim hats (4-inch brim around the entire circumference of the hat) are also recommended for sun protection.  - Call for new or changing lesions.  LENTIGINES, SEBORRHEIC KERATOSES, HEMANGIOMAS - Benign normal skin lesions - Benign-appearing - Call for any  changes  MELANOCYTIC NEVI - Tan-brown and/or pink-flesh-colored symmetric macules and papules - Benign appearing on exam today - Observation - Call clinic for new or changing moles - Recommend daily use of broad spectrum spf 30+ sunscreen to sun-exposed areas.   AK (ACTINIC KERATOSIS) Right Thigh - Anterior Destruction of lesion - Right Thigh - Anterior Complexity: simple   Destruction method: cryotherapy   Informed consent: discussed and consent obtained   Outcome: patient tolerated procedure well with no complications   Post-procedure details: wound care instructions given    NEOPLASM OF UNCERTAIN BEHAVIOR OF SKIN (2) Left Lower Leg - Anterior Skin / nail biopsy Type of biopsy: tangential   Informed consent: discussed and consent obtained   Timeout: patient name, date of birth, surgical site, and procedure verified   Procedure prep:  Patient was prepped and draped in usual sterile fashion Prep type:  Isopropyl alcohol  Anesthesia: the lesion was anesthetized in a standard fashion   Anesthetic:  1% lidocaine  w/ epinephrine  1-100,000 buffered w/ 8.4% NaHCO3 Instrument used: DermaBlade   Hemostasis achieved with: aluminum chloride   Outcome: patient tolerated procedure well   Post-procedure details: sterile dressing applied and wound care instructions given   Dressing type: petrolatum gauze and bandage    Specimen 1 - Surgical pathology Differential Diagnosis: r/o NLD vs NMSC vs MM vs inflammatory vs stasis  Check Margins: No Left Preauricular Area Skin / nail biopsy Type of biopsy: tangential   Informed consent: discussed and consent obtained   Timeout: patient name, date of birth, surgical site, and procedure verified   Procedure prep:  Patient was prepped and draped in usual sterile fashion Prep type:  Isopropyl alcohol  Anesthesia: the lesion was anesthetized  in a standard fashion   Anesthetic:  1% lidocaine  w/ epinephrine  1-100,000 buffered w/ 8.4% NaHCO3 Instrument  used: DermaBlade   Hemostasis achieved with: aluminum chloride   Outcome: patient tolerated procedure well   Post-procedure details: sterile dressing applied and wound care instructions given   Dressing type: petrolatum gauze and bandage    Specimen 2 - Surgical pathology Differential Diagnosis: r/o MM vs other  Check Margins: No  Return in about 1 year (around 12/09/2024) for TBSC.  I, Berwyn Lesches, Surg Tech III, am acting as scribe for RUFUS CHRISTELLA HOLY, MD.   Documentation: I have reviewed the above documentation for accuracy and completeness, and I agree with the above.  RUFUS CHRISTELLA HOLY, MD

## 2023-12-11 LAB — SURGICAL PATHOLOGY

## 2023-12-12 ENCOUNTER — Other Ambulatory Visit: Payer: Self-pay

## 2023-12-12 ENCOUNTER — Ambulatory Visit: Payer: Self-pay | Admitting: Dermatology

## 2023-12-14 ENCOUNTER — Inpatient Hospital Stay (HOSPITAL_BASED_OUTPATIENT_CLINIC_OR_DEPARTMENT_OTHER)
Admission: EM | Admit: 2023-12-14 | Discharge: 2023-12-17 | DRG: 871 | Disposition: A | Discharge status: Home / Self Care | Attending: Family Medicine | Admitting: Family Medicine

## 2023-12-14 ENCOUNTER — Encounter (HOSPITAL_BASED_OUTPATIENT_CLINIC_OR_DEPARTMENT_OTHER): Payer: Self-pay

## 2023-12-14 ENCOUNTER — Other Ambulatory Visit: Payer: Self-pay

## 2023-12-14 ENCOUNTER — Emergency Department (HOSPITAL_BASED_OUTPATIENT_CLINIC_OR_DEPARTMENT_OTHER)

## 2023-12-14 DIAGNOSIS — B9689 Other specified bacterial agents as the cause of diseases classified elsewhere: Secondary | ICD-10-CM | POA: Diagnosis present

## 2023-12-14 DIAGNOSIS — J90 Pleural effusion, not elsewhere classified: Secondary | ICD-10-CM | POA: Diagnosis not present

## 2023-12-14 DIAGNOSIS — Y95 Nosocomial condition: Secondary | ICD-10-CM | POA: Diagnosis present

## 2023-12-14 DIAGNOSIS — Z8249 Family history of ischemic heart disease and other diseases of the circulatory system: Secondary | ICD-10-CM

## 2023-12-14 DIAGNOSIS — Z87891 Personal history of nicotine dependence: Secondary | ICD-10-CM

## 2023-12-14 DIAGNOSIS — J168 Pneumonia due to other specified infectious organisms: Secondary | ICD-10-CM | POA: Diagnosis not present

## 2023-12-14 DIAGNOSIS — R0602 Shortness of breath: Secondary | ICD-10-CM | POA: Diagnosis not present

## 2023-12-14 DIAGNOSIS — Z6839 Body mass index (BMI) 39.0-39.9, adult: Secondary | ICD-10-CM

## 2023-12-14 DIAGNOSIS — D6959 Other secondary thrombocytopenia: Secondary | ICD-10-CM | POA: Diagnosis present

## 2023-12-14 DIAGNOSIS — Z7952 Long term (current) use of systemic steroids: Secondary | ICD-10-CM

## 2023-12-14 DIAGNOSIS — R0902 Hypoxemia: Secondary | ICD-10-CM

## 2023-12-14 DIAGNOSIS — R6520 Severe sepsis without septic shock: Secondary | ICD-10-CM | POA: Diagnosis present

## 2023-12-14 DIAGNOSIS — A419 Sepsis, unspecified organism: Secondary | ICD-10-CM | POA: Diagnosis not present

## 2023-12-14 DIAGNOSIS — J45909 Unspecified asthma, uncomplicated: Secondary | ICD-10-CM | POA: Diagnosis present

## 2023-12-14 DIAGNOSIS — K760 Fatty (change of) liver, not elsewhere classified: Secondary | ICD-10-CM | POA: Diagnosis present

## 2023-12-14 DIAGNOSIS — E785 Hyperlipidemia, unspecified: Secondary | ICD-10-CM | POA: Diagnosis present

## 2023-12-14 DIAGNOSIS — J9621 Acute and chronic respiratory failure with hypoxia: Secondary | ICD-10-CM | POA: Diagnosis present

## 2023-12-14 DIAGNOSIS — J189 Pneumonia, unspecified organism: Principal | ICD-10-CM | POA: Diagnosis present

## 2023-12-14 DIAGNOSIS — Z79899 Other long term (current) drug therapy: Secondary | ICD-10-CM

## 2023-12-14 DIAGNOSIS — D696 Thrombocytopenia, unspecified: Secondary | ICD-10-CM

## 2023-12-14 DIAGNOSIS — Z1152 Encounter for screening for COVID-19: Secondary | ICD-10-CM

## 2023-12-14 DIAGNOSIS — R918 Other nonspecific abnormal finding of lung field: Secondary | ICD-10-CM | POA: Diagnosis not present

## 2023-12-14 DIAGNOSIS — K219 Gastro-esophageal reflux disease without esophagitis: Secondary | ICD-10-CM | POA: Diagnosis present

## 2023-12-14 DIAGNOSIS — R591 Generalized enlarged lymph nodes: Secondary | ICD-10-CM | POA: Diagnosis not present

## 2023-12-14 DIAGNOSIS — J9601 Acute respiratory failure with hypoxia: Secondary | ICD-10-CM | POA: Diagnosis present

## 2023-12-14 DIAGNOSIS — Z91018 Allergy to other foods: Secondary | ICD-10-CM

## 2023-12-14 DIAGNOSIS — R6510 Systemic inflammatory response syndrome (SIRS) of non-infectious origin without acute organ dysfunction: Secondary | ICD-10-CM

## 2023-12-14 DIAGNOSIS — G4733 Obstructive sleep apnea (adult) (pediatric): Secondary | ICD-10-CM | POA: Diagnosis present

## 2023-12-14 DIAGNOSIS — E66813 Obesity, class 3: Secondary | ICD-10-CM | POA: Diagnosis present

## 2023-12-14 DIAGNOSIS — I10 Essential (primary) hypertension: Secondary | ICD-10-CM | POA: Diagnosis present

## 2023-12-14 DIAGNOSIS — F419 Anxiety disorder, unspecified: Secondary | ICD-10-CM | POA: Diagnosis present

## 2023-12-14 LAB — CBC
HCT: 39 % (ref 39.0–52.0)
Hemoglobin: 13.8 g/dL (ref 13.0–17.0)
MCH: 34.1 pg — ABNORMAL HIGH (ref 26.0–34.0)
MCHC: 35.4 g/dL (ref 30.0–36.0)
MCV: 96.3 fL (ref 80.0–100.0)
Platelets: 126 K/uL — ABNORMAL LOW (ref 150–400)
RBC: 4.05 MIL/uL — ABNORMAL LOW (ref 4.22–5.81)
RDW: 12.5 % (ref 11.5–15.5)
WBC: 11.6 K/uL — ABNORMAL HIGH (ref 4.0–10.5)
nRBC: 0 % (ref 0.0–0.2)

## 2023-12-14 LAB — BASIC METABOLIC PANEL WITH GFR
Anion gap: 12 (ref 5–15)
BUN: 14 mg/dL (ref 6–20)
CO2: 22 mmol/L (ref 22–32)
Calcium: 9.2 mg/dL (ref 8.9–10.3)
Chloride: 104 mmol/L (ref 98–111)
Creatinine, Ser: 0.8 mg/dL (ref 0.61–1.24)
GFR, Estimated: >60 mL/min (ref >60)
Glucose, Bld: 144 mg/dL — ABNORMAL HIGH (ref 70–99)
Potassium: 4.1 mmol/L (ref 3.5–5.1)
Sodium: 138 mmol/L (ref 135–145)

## 2023-12-14 LAB — RESP PANEL BY RT-PCR (RSV, FLU A&B, COVID)  RVPGX2
Influenza A by PCR: NEGATIVE
Influenza B by PCR: NEGATIVE
Resp Syncytial Virus by PCR: NEGATIVE
SARS Coronavirus 2 by RT PCR: NEGATIVE

## 2023-12-14 LAB — PRO BRAIN NATRIURETIC PEPTIDE: Pro Brain Natriuretic Peptide: 320 pg/mL — ABNORMAL HIGH (ref <300.0)

## 2023-12-14 LAB — TROPONIN T, HIGH SENSITIVITY: Troponin T High Sensitivity: <15 ng/L (ref 0–19)

## 2023-12-14 LAB — D-DIMER, QUANTITATIVE: D-Dimer, Quant: 0.65 ug{FEU}/mL — ABNORMAL HIGH (ref 0.00–0.50)

## 2023-12-14 MED ORDER — IOHEXOL 350 MG/ML SOLN
75.0000 mL | Freq: Once | INTRAVENOUS | Status: AC | PRN
  Administered 2023-12-14: 75 mL via INTRAVENOUS

## 2023-12-14 MED ORDER — SODIUM CHLORIDE 0.9 % IV SOLN
2.0000 g | Freq: Once | INTRAVENOUS | Status: AC
  Administered 2023-12-14: 2 g via INTRAVENOUS
  Filled 2023-12-14: qty 20

## 2023-12-14 MED ORDER — IPRATROPIUM-ALBUTEROL 0.5-2.5 (3) MG/3ML IN SOLN
3.0000 mL | Freq: Once | RESPIRATORY_TRACT | Status: AC
  Administered 2023-12-14: 3 mL via RESPIRATORY_TRACT
  Filled 2023-12-14: qty 3

## 2023-12-14 MED ORDER — AZITHROMYCIN 500 MG IV SOLR
500.0000 mg | Freq: Once | INTRAVENOUS | Status: AC
  Administered 2023-12-14: 500 mg via INTRAVENOUS
  Filled 2023-12-14: qty 5

## 2023-12-14 MED ORDER — ACETAMINOPHEN 500 MG PO TABS
1000.0000 mg | ORAL_TABLET | Freq: Once | ORAL | Status: AC
  Administered 2023-12-14: 1000 mg via ORAL
  Filled 2023-12-14: qty 2

## 2023-12-14 NOTE — ED Notes (Signed)
 Repeat troponin not needed per provider.

## 2023-12-14 NOTE — Plan of Care (Signed)
 Plan of Care Note for accepted transfer  Patient: Ryan Holland    FMW:[id number]  DOA: 12/14/2023      Facility requesting transfer: [facility] Requesting Provider: Dr Prentice Medicus Reason for transfer: Shortness of breath with hypoxia, O2 sats 80s at home Facility course: 53 year old patient with OSA, HTN, asthma, presented with shortness of breath over the last few days, no fevers or chills, leg swelling or cough.  At home, O2 sats in 80s with minimal exertion. CTA chest showed no PE, bilateral patchy and ground glass opacities in the lungs with increasing right upper lobe involvement, possibilities include pulmonary edema, atypical pneumonia or acute hypersensitivity pneumonitis, small bilateral pleural effusions.  Started on IV antibiotics  BNP 320 , 2D echo in 09/2023 had shown EF of 60 to 65% with normal diastolic parameters  Plan of care: The patient is accepted for admission to Telemetry unit, at [facility].    Author: Nydia Distance, MD  12/14/2023  Check [email or web address] for on-call coverage.

## 2023-12-14 NOTE — ED Provider Notes (Signed)
 [place] [facility] Provider Note   CSN: [id number] Arrival date & time: 12/14/23  9281     Patient presents with: Shortness of Breath   Ryan Holland is a 53 y.o. male.   53 year old male with past medical history of obstructive sleep apnea, hypertension, recent diagnosis pneumonia, and asthma presenting to the emergency department today with shortness of breath.  Patient states that he has been having chest tightness and shortness of breath over the past few days.  Denies any significant cough with this.  He denies any fevers or chills.  Denies any leg swelling.  He states that he does check his pulse ox at home occasionally.  States that it has been as low as 80 yesterday with minimal exertion.  He came to the ER for further evaluation regarding this.  He denies a history of DVT or pulmonary embolism, recent surgeries, recent travel.   Shortness of Breath      Prior to Admission medications   Medication Sig Start Date End Date Taking? Authorizing Provider  acamprosate  (CAMPRAL ) 333 MG tablet Take 2 tablets (666 mg total) by mouth 3 (three) times daily with meals. Patient not taking: Reported on 10/30/2023 10/09/23   Saguier, Dallas, [name]  albuterol  (PROAIR  HFA) 108 (90 Base) MCG/ACT inhaler Inhale 2 puffs into the lungs every 4 (four) hours as needed for wheezing or shortness of breath. 12/03/23   Darlean Ozell NOVAK, MD  benzonatate  (TESSALON ) 100 MG capsule Take 1 capsule (100 mg total) by mouth 3 (three) times daily as needed for cough. 11/06/23   Saguier, Dallas, [name]  cholecalciferol (VITAMIN D3) 25 MCG (1000 UNIT) tablet Take 1,000 Units by mouth as directed.    [provider]  citalopram  (CELEXA ) 40 MG tablet Take 1 tablet (40 mg total) by mouth daily. 11/30/23   Pavero, Lonni, RPH  clonazePAM  (KLONOPIN ) 0.5 MG tablet Take 1 tablet (0.5 mg total) by mouth at bedtime as needed anxiety and insomnia 10/09/23   Saguier, Dallas, [name]   diphenhydrAMINE (BENADRYL) 25 mg capsule Take 50 mg by mouth in the morning.    [provider]  Dupilumab  (DUPIXENT ) 300 MG/2ML SOAJ Inject 300 mg into the skin every 14 (fourteen) days. 09/20/23   Jegede, Olugbemiga E, MD  Evolocumab  (REPATHA  SURECLICK) 140 MG/ML SOAJ Inject 140 mg into the skin every 14 (fourteen) days. 09/21/23   Kate Lonni CROME, MD  fluticasone  (FLONASE ) 50 MCG/ACT nasal spray Place 2 sprays into both nostrils daily. 09/27/22   Saguier, Dallas, [name]  fluticasone  furoate-vilanterol (BREO ELLIPTA ) 200-25 MCG/ACT AEPB Inhale 1 puff into the lungs daily. 06/19/23   Geronimo Amel, MD  methocarbamol  (ROBAXIN ) 500 MG tablet Take 1 tablet by mouth every  8 hours as needed for muscle spams. 11/22/23   Saguier, Dallas, [name]  montelukast  (SINGULAIR ) 10 MG tablet Take 1 tablet (10 mg total) by mouth daily. 04/09/23   Saguier, Dallas, [name]  pantoprazole  (PROTONIX ) 40 MG tablet Take 1 tablet (40 mg total) by mouth daily. 04/09/23   Saguier, Dallas, [name]  predniSONE  (DELTASONE ) 10 MG tablet Take 6 tablets (60 mg total) by mouth daily with breakfast. And decrease by one tablet daily 09/29/23   Tat, Alm, MD  predniSONE  (DELTASONE ) 10 MG tablet Take 4 tablets once daily for 1 week, then 3 tabs once daily for 1 week,  then 2 tabs once daily for 1 week, then 1 tab once daily for 1 week,  then 1/2 tab once daily for 1  week,  then 1/2 tab on Monday, Wednesday, & Friday for 1 week, and then stop (Total 6 week course. 11/07/23   Geronimo Amel, MD    Allergies: Other    Review of Systems  Respiratory:  Positive for shortness of breath.   All other systems reviewed and are negative.   Updated Vital Signs BP 135/81 (BP Location: Left Arm)   Pulse 88   Temp 99.1 F (37.3 C) (Oral)   Resp 20   SpO2 97%   Physical Exam Vitals and nursing note reviewed.   Gen: NAD Eyes: PERRL, EOMI HEENT: no oropharyngeal swelling Neck: trachea midline Resp: Diminished bilaterally with no  wheezes Card: Tachycardic, no murmurs, rubs, or gallops Abd: nontender, nondistended Extremities: no calf tenderness, no edema Vascular: 2+ radial pulses bilaterally, 2+ DP pulses bilaterally Neuro: No focal deficits Skin: no rashes Psyc: acting appropriately   (all labs ordered are listed, but only abnormal results are displayed) Labs Reviewed  BASIC METABOLIC PANEL WITH GFR - Abnormal; Notable for the following components:      Result Value   Glucose, Bld 144 (*)    All other components within normal limits  CBC - Abnormal; Notable for the following components:   WBC 11.6 (*)    RBC 4.05 (*)    MCH 34.1 (*)    Platelets 126 (*)    All other components within normal limits  PRO BRAIN NATRIURETIC PEPTIDE - Abnormal; Notable for the following components:   Pro Brain Natriuretic Peptide 320.0 (*)    All other components within normal limits  D-DIMER, QUANTITATIVE - Abnormal; Notable for the following components:   D-Dimer, Quant 0.65 (*)    All other components within normal limits  RESP PANEL BY RT-PCR (RSV, FLU A&B, COVID)  [id number]  CULTURE, BLOOD (ROUTINE X 2)  CULTURE, BLOOD (ROUTINE X 2)  TROPONIN T, HIGH SENSITIVITY    EKG: EKG Interpretation Date/Time:  Friday December 14 2023 07:31:10 EDT Ventricular Rate:  101 PR Interval:  166 QRS Duration:  84 QT Interval:  357 QTC Calculation: 463 R Axis:   12  Text Interpretation: Sinus tachycardia Confirmed by Ula Barter [phone]) on 12/14/2023 7:37:34 AM  Radiology: CT Angio Chest PE W and/or Wo Contrast Result Date: 12/14/2023 CLINICAL DATA:  Elevated D-dimer level. Shortness of breath and hypoxia. EXAM: CT ANGIOGRAPHY CHEST WITH CONTRAST TECHNIQUE: Multidetector CT imaging of the chest was performed using the standard protocol during bolus administration of intravenous contrast. Multiplanar CT image reconstructions and MIPs were obtained to evaluate the vascular anatomy. RADIATION DOSE REDUCTION: This exam was performed  according to the departmental dose-optimization program which includes automated exposure control, adjustment of the mA and/or kV according to patient size and/or use of iterative reconstruction technique. CONTRAST:  75mL OMNIPAQUE  IOHEXOL  350 MG/ML SOLN COMPARISON:  09/23/2023 FINDINGS: Cardiovascular: No filling defect is identified in the pulmonary arterial tree to suggest pulmonary embolus. Prominent main pulmonary artery at 4.1 cm diameter, suspicious for pulmonary arterial hypertension. Mild atheromatous vascular calcification of the aortic arch. Mild cardiomegaly. Mediastinum/Nodes: Mildly enlarged right lower paratracheal node 1.1 cm in short axis on image 56 series 6. Other upper normal sized mediastinal lymph nodes are observed. Mildly enlarged 1.1 cm in short axis right hilar lymph node, image 67 series 6. These are similar to the 09/23/2023 exam. Mild prominence of mediastinal adipose tissues. Lungs/Pleura: Continued bilateral patchy and confluent ground-glass opacities in the lungs, with increase in right upper lobe involvement but mildly decreased left lung and  right lower lobe involvement possibilities including pulmonary edema, atypical pneumonia, or acute hypersensitivity pneumonitis. Given the small bilateral pleural effusions, pulmonary edema is favored. Upper Abdomen: Mildly lobulated hepatic contour, cannot exclude cirrhosis. Musculoskeletal: Thoracic spondylosis. Review of the MIP images confirms the above findings. IMPRESSION: 1. No filling defect is identified in the pulmonary arterial tree to suggest pulmonary embolus. 2. Continued bilateral patchy and confluent ground-glass opacities in the lungs, with increase in right upper lobe involvement but mildly decreased left lung and right lower lobe involvement. Possibilities include pulmonary edema (favored), atypical pneumonia, or acute hypersensitivity pneumonitis. 3. Small bilateral pleural effusions. 4. Mild cardiomegaly. 5. Prominent main  pulmonary artery at 4.1 cm diameter, suspicious for pulmonary arterial hypertension. 6. Mildly enlarged right lower paratracheal and right hilar lymph nodes, similar to the 09/23/2023 exam. 7. Mildly lobulated hepatic contour, cannot exclude cirrhosis. 8. Thoracic spondylosis. 9.  Aortic Atherosclerosis (ICD10-I70.0). Electronically Signed   By: Ryan Salvage M.D.   On: 12/14/2023 11:24   DG Chest Port 1 View Result Date: 12/14/2023 CLINICAL DATA:  Shortness of breath. EXAM: PORTABLE CHEST 1 VIEW COMPARISON:  October 29, 2023. FINDINGS: Stable cardiomediastinal silhouette. Left lung is clear. Right lower lobe airspace opacity is noted concerning for pneumonia. Bony thorax is unremarkable. IMPRESSION: Right lower lobe airspace opacity is noted concerning for pneumonia. Electronically Signed   By: Lynwood Landy Raddle M.D.   On: 12/14/2023 07:57     Procedures   Medications Ordered in the ED  ipratropium-albuterol  (DUONEB) 0.5-2.5 (3) MG/3ML nebulizer solution 3 mL (3 mLs Nebulization Given 12/14/23 0739)  cefTRIAXone  (ROCEPHIN ) 2 g in sodium chloride  0.9 % 100 mL IVPB (0 g Intravenous Stopped 12/14/23 0920)  azithromycin  (ZITHROMAX ) 500 mg in sodium chloride  0.9 % 250 mL IVPB (0 mg Intravenous Stopped 12/14/23 1027)  iohexol  (OMNIPAQUE ) 350 MG/ML injection 75 mL (75 mLs Intravenous Contrast Given 12/14/23 1030)                                    Medical Decision Making 53 year old male with past medical history of of obstructive sleep apnea, hypertension, and asthma presenting to the emergency department today with shortness of breath and reports of low pulse ox at home.  The patient is somewhat diminished here on exam.  Do not appreciate any wheezing.  Will further evaluate him here with basic labs Wels and EKG, chest x-ray, troponin, BNP, and D-dimer to evaluate for ACS, pulmonary edema, pulmonary infiltrates, pneumothorax, or pulmonary embolism.  I will give the patient a DuoNeb here to see if  this helps with his symptoms at all as he is slightly diminished.  Will reevaluate for ultimate disposition.  The patient's chest x-ray shows concerns for pneumonia.  The patient is covered with Rocephin  and azithromycin .  D-dimer is elevated.  Subsequent CT angiogram shows questionable pneumonia versus pulmonary edema.  The patient was febrile on arrival so suspect is likely due to pneumonia.  Given his hypoxia at home and with minimal exertion a call is placed to hospital service for admission.  Amount and/or Complexity of Data Reviewed Labs: ordered. Radiology: ordered.  Risk Prescription drug management. Decision regarding hospitalization.        Final diagnoses:  Pneumonia due to infectious organism, unspecified laterality, unspecified part of lung  Hypoxia    ED Discharge Orders     None          Ula Prentice SAUNDERS,  MD 12/14/23 8491

## 2023-12-14 NOTE — ED Triage Notes (Signed)
 Pt presents with complaints of increasing SOB and increased need for oxygen X 3 days. Pt uses 2l [place] but has not needed to use it for the past few months. Pt reports O2 sat has been in the low 80s. Pt has increased his O2 to 3L. Also endorses fever at home.  Pt 95% on RA during triage

## 2023-12-14 NOTE — ED Notes (Signed)
[facility] at bedside at this time.  

## 2023-12-14 NOTE — ED Notes (Signed)
 Patient transported to CT

## 2023-12-14 NOTE — ED Notes (Signed)
 [facility] called for transport.

## 2023-12-15 DIAGNOSIS — F419 Anxiety disorder, unspecified: Secondary | ICD-10-CM | POA: Diagnosis present

## 2023-12-15 DIAGNOSIS — J45909 Unspecified asthma, uncomplicated: Secondary | ICD-10-CM | POA: Diagnosis not present

## 2023-12-15 DIAGNOSIS — Z87891 Personal history of nicotine dependence: Secondary | ICD-10-CM | POA: Diagnosis not present

## 2023-12-15 DIAGNOSIS — R6510 Systemic inflammatory response syndrome (SIRS) of non-infectious origin without acute organ dysfunction: Secondary | ICD-10-CM

## 2023-12-15 DIAGNOSIS — J189 Pneumonia, unspecified organism: Secondary | ICD-10-CM | POA: Diagnosis not present

## 2023-12-15 DIAGNOSIS — D696 Thrombocytopenia, unspecified: Secondary | ICD-10-CM

## 2023-12-15 DIAGNOSIS — B9689 Other specified bacterial agents as the cause of diseases classified elsewhere: Secondary | ICD-10-CM | POA: Diagnosis not present

## 2023-12-15 DIAGNOSIS — Y95 Nosocomial condition: Secondary | ICD-10-CM | POA: Diagnosis present

## 2023-12-15 DIAGNOSIS — Z7952 Long term (current) use of systemic steroids: Secondary | ICD-10-CM | POA: Diagnosis not present

## 2023-12-15 DIAGNOSIS — E66813 Obesity, class 3: Secondary | ICD-10-CM | POA: Diagnosis not present

## 2023-12-15 DIAGNOSIS — Z6839 Body mass index (BMI) 39.0-39.9, adult: Secondary | ICD-10-CM | POA: Diagnosis not present

## 2023-12-15 DIAGNOSIS — Z91018 Allergy to other foods: Secondary | ICD-10-CM | POA: Diagnosis not present

## 2023-12-15 DIAGNOSIS — Z8249 Family history of ischemic heart disease and other diseases of the circulatory system: Secondary | ICD-10-CM | POA: Diagnosis not present

## 2023-12-15 DIAGNOSIS — K219 Gastro-esophageal reflux disease without esophagitis: Secondary | ICD-10-CM | POA: Diagnosis present

## 2023-12-15 DIAGNOSIS — J9601 Acute respiratory failure with hypoxia: Secondary | ICD-10-CM | POA: Diagnosis not present

## 2023-12-15 DIAGNOSIS — D6959 Other secondary thrombocytopenia: Secondary | ICD-10-CM | POA: Diagnosis not present

## 2023-12-15 DIAGNOSIS — J9621 Acute and chronic respiratory failure with hypoxia: Secondary | ICD-10-CM | POA: Diagnosis not present

## 2023-12-15 DIAGNOSIS — R6520 Severe sepsis without septic shock: Secondary | ICD-10-CM | POA: Diagnosis not present

## 2023-12-15 DIAGNOSIS — A419 Sepsis, unspecified organism: Secondary | ICD-10-CM | POA: Diagnosis not present

## 2023-12-15 DIAGNOSIS — Z79899 Other long term (current) drug therapy: Secondary | ICD-10-CM | POA: Diagnosis not present

## 2023-12-15 DIAGNOSIS — R0902 Hypoxemia: Secondary | ICD-10-CM | POA: Diagnosis present

## 2023-12-15 DIAGNOSIS — I10 Essential (primary) hypertension: Secondary | ICD-10-CM | POA: Diagnosis not present

## 2023-12-15 DIAGNOSIS — Z1152 Encounter for screening for COVID-19: Secondary | ICD-10-CM | POA: Diagnosis not present

## 2023-12-15 DIAGNOSIS — E785 Hyperlipidemia, unspecified: Secondary | ICD-10-CM | POA: Diagnosis present

## 2023-12-15 DIAGNOSIS — G4733 Obstructive sleep apnea (adult) (pediatric): Secondary | ICD-10-CM | POA: Diagnosis present

## 2023-12-15 DIAGNOSIS — K760 Fatty (change of) liver, not elsewhere classified: Secondary | ICD-10-CM | POA: Diagnosis present

## 2023-12-15 LAB — BLOOD CULTURE ID PANEL (REFLEXED) - BCID2

## 2023-12-15 LAB — RESPIRATORY PANEL BY PCR

## 2023-12-15 LAB — CBC
HCT: 38.8 % — ABNORMAL LOW (ref 39.0–52.0)
Hemoglobin: 12.8 g/dL — ABNORMAL LOW (ref 13.0–17.0)
MCH: 33.2 pg (ref 26.0–34.0)
MCHC: 33 g/dL (ref 30.0–36.0)
MCV: 100.8 fL — ABNORMAL HIGH (ref 80.0–100.0)
Platelets: 155 K/uL (ref 150–400)
RBC: 3.85 MIL/uL — ABNORMAL LOW (ref 4.22–5.81)
RDW: 12.6 % (ref 11.5–15.5)
WBC: 10.4 K/uL (ref 4.0–10.5)
nRBC: 0 % (ref 0.0–0.2)

## 2023-12-15 LAB — COMPREHENSIVE METABOLIC PANEL WITH GFR
ALT: 33 U/L (ref 0–44)
AST: 47 U/L — ABNORMAL HIGH (ref 15–41)
Albumin: 3.3 g/dL — ABNORMAL LOW (ref 3.5–5.0)
Alkaline Phosphatase: 171 U/L — ABNORMAL HIGH (ref 38–126)
Anion gap: 9 (ref 5–15)
BUN: 13 mg/dL (ref 6–20)
CO2: 22 mmol/L (ref 22–32)
Calcium: 8.9 mg/dL (ref 8.9–10.3)
Chloride: 104 mmol/L (ref 98–111)
Creatinine, Ser: 0.82 mg/dL (ref 0.61–1.24)
GFR, Estimated: >60 mL/min (ref >60)
Glucose, Bld: 109 mg/dL — ABNORMAL HIGH (ref 70–99)
Potassium: 3.7 mmol/L (ref 3.5–5.1)
Sodium: 135 mmol/L (ref 135–145)
Total Bilirubin: 2.3 mg/dL — ABNORMAL HIGH (ref 0.0–1.2)
Total Protein: 6.2 g/dL — ABNORMAL LOW (ref 6.5–8.1)

## 2023-12-15 LAB — PROCALCITONIN: Procalcitonin: 1.6 ng/mL

## 2023-12-15 LAB — STREP PNEUMONIAE URINARY ANTIGEN: Strep Pneumo Urinary Antigen: NEGATIVE

## 2023-12-15 LAB — LACTIC ACID, PLASMA: Lactic Acid, Venous: 1 mmol/L (ref 0.5–1.9)

## 2023-12-15 MED ORDER — VANCOMYCIN HCL 2000 MG/400ML IV SOLN
2000.0000 mg | Freq: Once | INTRAVENOUS | Status: AC
  Administered 2023-12-15: 2000 mg via INTRAVENOUS
  Filled 2023-12-15: qty 400

## 2023-12-15 MED ORDER — ALBUTEROL SULFATE (2.5 MG/3ML) 0.083% IN NEBU: 2.5000 mg | INHALATION_SOLUTION | RESPIRATORY_TRACT | Status: DC | PRN

## 2023-12-15 MED ORDER — CITALOPRAM HYDROBROMIDE 20 MG PO TABS
40.0000 mg | ORAL_TABLET | Freq: Every day | ORAL | Status: DC
  Administered 2023-12-15 – 2023-12-17 (×3): 40 mg via ORAL
  Filled 2023-12-15 (×3): qty 2

## 2023-12-15 MED ORDER — SODIUM CHLORIDE 0.9 % IV SOLN
2.0000 g | INTRAVENOUS | Status: DC
  Administered 2023-12-15 – 2023-12-17 (×3): 2 g via INTRAVENOUS
  Filled 2023-12-15 (×3): qty 20

## 2023-12-15 MED ORDER — PANTOPRAZOLE SODIUM 40 MG PO TBEC
40.0000 mg | DELAYED_RELEASE_TABLET | Freq: Every day | ORAL | Status: DC
  Administered 2023-12-15 – 2023-12-17 (×3): 40 mg via ORAL
  Filled 2023-12-15 (×3): qty 1

## 2023-12-15 MED ORDER — ALBUTEROL SULFATE HFA 108 (90 BASE) MCG/ACT IN AERS: 2.0000 | INHALATION_SPRAY | RESPIRATORY_TRACT | Status: DC | PRN

## 2023-12-15 MED ORDER — FUROSEMIDE 10 MG/ML IJ SOLN
20.0000 mg | Freq: Once | INTRAMUSCULAR | Status: AC
  Administered 2023-12-15: 20 mg via INTRAVENOUS
  Filled 2023-12-15: qty 2

## 2023-12-15 MED ORDER — VANCOMYCIN HCL 1500 MG/300ML IV SOLN
1500.0000 mg | Freq: Two times a day (BID) | INTRAVENOUS | Status: DC
  Administered 2023-12-15 – 2023-12-17 (×4): 1500 mg via INTRAVENOUS
  Filled 2023-12-15 (×4): qty 300

## 2023-12-15 MED ORDER — ACETAMINOPHEN 325 MG PO TABS
650.0000 mg | ORAL_TABLET | Freq: Once | ORAL | Status: AC
Start: 2023-12-15 — End: 2023-12-15
  Administered 2023-12-15: 650 mg via ORAL
  Filled 2023-12-15: qty 2

## 2023-12-15 MED ORDER — FLUTICASONE PROPIONATE 50 MCG/ACT NA SUSP
2.0000 | Freq: Every day | NASAL | Status: DC
  Administered 2023-12-15 – 2023-12-17 (×3): 2 via NASAL
  Filled 2023-12-15: qty 16

## 2023-12-15 MED ORDER — SODIUM CHLORIDE 0.9 % IV SOLN
500.0000 mg | INTRAVENOUS | Status: DC
  Administered 2023-12-15 – 2023-12-17 (×3): 500 mg via INTRAVENOUS
  Filled 2023-12-15 (×3): qty 5

## 2023-12-15 MED ORDER — MONTELUKAST SODIUM 10 MG PO TABS
10.0000 mg | ORAL_TABLET | Freq: Every day | ORAL | Status: DC
Start: 2023-12-15 — End: 2023-12-17
  Administered 2023-12-15 – 2023-12-17 (×3): 10 mg via ORAL
  Filled 2023-12-15 (×3): qty 1

## 2023-12-15 MED ORDER — FLUTICASONE FUROATE-VILANTEROL 200-25 MCG/ACT IN AEPB
1.0000 | INHALATION_SPRAY | Freq: Every day | RESPIRATORY_TRACT | Status: DC
  Administered 2023-12-15 – 2023-12-17 (×3): 1 via RESPIRATORY_TRACT
  Filled 2023-12-15: qty 28

## 2023-12-15 MED ORDER — ENOXAPARIN SODIUM 60 MG/0.6ML IJ SOSY
60.0000 mg | PREFILLED_SYRINGE | INTRAMUSCULAR | Status: DC
  Administered 2023-12-15 – 2023-12-17 (×3): 60 mg via SUBCUTANEOUS
  Filled 2023-12-15 (×3): qty 0.6

## 2023-12-15 NOTE — H&P (Signed)
 History and Physical    Bari Handshoe FMW:[id number] DOB: 01/02/1971 DOA: 12/14/2023  PCP: Dorina Loving, [name]  Patient coming from: [facility] ED  Chief Complaint: Shortness of breath  HPI: Ryan Holland is a 53 y.o. male with medical history significant of hypertension, hyperlipidemia, anxiety, GERD, OSA on CPAP, class III obesity, asthma, alcohol  use disorder, hepatic steatosis, alcoholic hepatitis, splenomegaly, thrombocytopenia, hyponatremia presenting with a chief complaint of shortness of breath.  Patient states he was admitted to the hospital for pneumonia back in July and was discharged on a steroid taper.  He symptoms initially improved but then he started having shortness of breath again so he was seen by pulmonology in early September and started on a 6-week prednisone  taper which he is about to finish this week but for the past few days he is having increasing shortness of breath and hypoxia.  His pulse ox was as low as 70s at home.  When he left the hospital in July, he was sent home on supplemental oxygen which he did not require until a few days ago.  He is using 2 L nasal cannula.  Also having low-grade fevers and chills for the past few days.  No cough or chest pain.  ED Course: Febrile and tachycardic on arrival.  Labs notable for WBC count 11.6, platelet count 126k, blood cultures in process, troponin negative, COVID/influenza/RSV PCR negative, proBNP 320, D-dimer 0.65.  CTA chest negative for PE but showing findings concerning for pulmonary edema versus atypical pneumonia versus acute hypersensitivity pneumonitis.  Also showing small bilateral pleural effusions.  EKG showing sinus tachycardia and no acute ischemic changes.  Patient was given Tylenol , DuoNeb, ceftriaxone , and azithromycin .  Review of Systems:  Review of Systems  All other systems reviewed and are negative.   Past Medical History:  Diagnosis Date   Anxiety    GERD (gastroesophageal reflux disease)     OCCAS-WOULD USE OTC MED IF NEEDED   HNP (herniated nucleus pulposus), lumbar    PAIN IN LOWER BACK AND PAIN DOWN BOTH LEGS -12-24-14 radiates down right leg presently.   Hypertension    Sleep apnea    HAS CPAP-DOES NOT USE-DOES NOT KNOW SETTINGS    Past Surgical History:  Procedure Laterality Date   01/05/2011 LUMBAR DECOMPRESSION AND MICRODISCECTOMY     CERVICAL FUSION  C4-5      ESOPHAGOGASTRODUODENOSCOPY (EGD) WITH PROPOFOL  N/A 12/30/2014   Procedure: ESOPHAGOGASTRODUODENOSCOPY (EGD) WITH PROPOFOL ;  Surgeon: Elsie Cree, MD;  Location: [facility];  Service: Endoscopy;  Laterality: N/A;  With Bravo   LUMBAR LAMINECTOMY/DECOMPRESSION MICRODISCECTOMY  07/26/2011   Procedure: LUMBAR LAMINECTOMY/DECOMPRESSION MICRODISCECTOMY;  Surgeon: [name], MD;  Location: [facility];  Service: Orthopedics;  Laterality: Right;  Re-Do Lumbar Decompression L5-S1 on Right,    PILONIDAL CYST EXCISION       reports that he quit smoking about 12 years ago. His smoking use included cigarettes. He started smoking about 32 years ago. He has a 40 pack-year smoking history. He has quit using smokeless tobacco.  His smokeless tobacco use included chew. He reports current alcohol  use. He reports that he does not use drugs.  Allergies  Allergen Reactions   Other     LEMON-sores in mouth    Family History  Problem Relation Age of Onset   COPD Father    Congestive Heart Failure Father    Breast cancer Mother     Prior to Admission medications   Medication Sig Start Date End Date Taking? Authorizing Provider  acamprosate  (CAMPRAL ) 333 MG tablet Take 2 tablets (666 mg total) by mouth 3 (three) times daily with meals. Patient not taking: Reported on 10/30/2023 10/09/23   Saguier, [place], [name]  albuterol  (PROAIR  HFA) 108 (90 Base) MCG/ACT inhaler Inhale 2 puffs into the lungs every 4 (four) hours as needed for wheezing or shortness of breath. 12/03/23   Darlean Ozell NOVAK, MD  benzonatate  (TESSALON ) 100 MG  capsule Take 1 capsule (100 mg total) by mouth 3 (three) times daily as needed for cough. 11/06/23   Saguier, [place], [name]  cholecalciferol (VITAMIN D3) 25 MCG (1000 UNIT) tablet Take 1,000 Units by mouth as directed.    [provider]  citalopram  (CELEXA ) 40 MG tablet Take 1 tablet (40 mg total) by mouth daily. 11/30/23   Pavero, Lonni, RPH  clonazePAM  (KLONOPIN ) 0.5 MG tablet Take 1 tablet (0.5 mg total) by mouth at bedtime as needed anxiety and insomnia 10/09/23   Saguier, [place], [name]  diphenhydrAMINE (BENADRYL) 25 mg capsule Take 50 mg by mouth in the morning.    [provider]  Dupilumab  (DUPIXENT ) 300 MG/2ML SOAJ Inject 300 mg into the skin every 14 (fourteen) days. 09/20/23   Jegede, Olugbemiga E, MD  Evolocumab  (REPATHA  SURECLICK) 140 MG/ML SOAJ Inject 140 mg into the skin every 14 (fourteen) days. 09/21/23   Kate Lonni CROME, MD  fluticasone  (FLONASE ) 50 MCG/ACT nasal spray Place 2 sprays into both nostrils daily. 09/27/22   Saguier, Dallas, [name]  fluticasone  furoate-vilanterol (BREO ELLIPTA ) 200-25 MCG/ACT AEPB Inhale 1 puff into the lungs daily. 06/19/23   Geronimo Amel, MD  methocarbamol  (ROBAXIN ) 500 MG tablet Take 1 tablet by mouth every  8 hours as needed for muscle spams. 11/22/23   Saguier, Dallas, [name]  montelukast  (SINGULAIR ) 10 MG tablet Take 1 tablet (10 mg total) by mouth daily. 04/09/23   Saguier, Dallas, [name]  pantoprazole  (PROTONIX ) 40 MG tablet Take 1 tablet (40 mg total) by mouth daily. 04/09/23   Saguier, Dallas, [name]  predniSONE  (DELTASONE ) 10 MG tablet Take 6 tablets (60 mg total) by mouth daily with breakfast. And decrease by one tablet daily 09/29/23   Tat, Alm, MD  predniSONE  (DELTASONE ) 10 MG tablet Take 4 tablets once daily for 1 week, then 3 tabs once daily for 1 week,  then 2 tabs once daily for 1 week, then 1 tab once daily for 1 week,  then 1/2 tab once daily for 1 week,  then 1/2 tab on Monday, Wednesday, & Friday for 1 week, and then stop  (Total 6 week course. 11/07/23   Geronimo Amel, MD    Physical Exam: Vitals:   12/14/23 1938 12/14/23 1940 12/14/23 2133 12/14/23 2229  BP: (!) 164/82   (!) 142/81  Pulse: 99  100 90  Resp: 17   20  Temp: 98.3 F (36.8 C)   98.7 F (37.1 C)  TempSrc: Oral   Oral  SpO2: 99% 99% 100% 93%    Physical Exam Vitals reviewed.  Constitutional:      General: He is not in acute distress. HENT:     Head: Normocephalic and atraumatic.  Eyes:     Extraocular Movements: Extraocular movements intact.  Cardiovascular:     Rate and Rhythm: Normal rate and regular rhythm.     Heart sounds: Normal heart sounds.  Pulmonary:     Effort: Pulmonary effort is normal. No respiratory distress.     Breath sounds: No wheezing or rales.  Abdominal:     General: Bowel sounds  are normal.     Palpations: Abdomen is soft.     Tenderness: There is no abdominal tenderness. There is no guarding.  Musculoskeletal:     Cervical back: Normal range of motion.     Right lower leg: No edema.     Left lower leg: No edema.  Skin:    General: Skin is warm and dry.  Neurological:     General: No focal deficit present.     Mental Status: He is alert and oriented to person, place, and time.     Labs on Admission: I have personally reviewed following labs and imaging studies  CBC: Recent Labs  Lab 12/14/23 0735  WBC 11.6*  HGB 13.8  HCT 39.0  MCV 96.3  PLT 126*   Basic Metabolic Panel: Recent Labs  Lab 12/14/23 0735  NA 138  K 4.1  CL 104  CO2 22  GLUCOSE 144*  BUN 14  CREATININE 0.80  CALCIUM  9.2   GFR: CrCl cannot be calculated (Unknown ideal weight.). Liver Function Tests: No results for input(s): AST, ALT, ALKPHOS, BILITOT, PROT, ALBUMIN in the last 168 hours. No results for input(s): LIPASE, AMYLASE in the last 168 hours. No results for input(s): AMMONIA in the last 168 hours. Coagulation Profile: No results for input(s): INR, PROTIME in the last 168  hours. Cardiac Enzymes: No results for input(s): CKTOTAL, CKMB, CKMBINDEX, TROPONINI in the last 168 hours. BNP (last 3 results) Recent Labs    10/30/23 1540 12/14/23 0735  PROBNP 34.0 320.0*   HbA1C: No results for input(s): HGBA1C in the last 72 hours. CBG: No results for input(s): GLUCAP in the last 168 hours. Lipid Profile: No results for input(s): CHOL, HDL, LDLCALC, TRIG, CHOLHDL, LDLDIRECT in the last 72 hours. Thyroid  Function Tests: No results for input(s): TSH, T4TOTAL, FREET4, T3FREE, THYROIDAB in the last 72 hours. Anemia Panel: No results for input(s): VITAMINB12, FOLATE, FERRITIN, TIBC, IRON, RETICCTPCT in the last 72 hours. Urine analysis:    Component Value Date/Time   COLORURINE AMBER (A) 09/24/2023 0300   APPEARANCEUR CLEAR 09/24/2023 0300   LABSPEC >1.046 (H) 09/24/2023 0300   PHURINE 6.0 09/24/2023 0300   GLUCOSEU NEGATIVE 09/24/2023 0300   HGBUR NEGATIVE 09/24/2023 0300   BILIRUBINUR SMALL (A) 09/24/2023 0300   BILIRUBINUR negative 03/17/2021 1015   KETONESUR NEGATIVE 09/24/2023 0300   PROTEINUR NEGATIVE 09/24/2023 0300   UROBILINOGEN 1.0 03/17/2021 1015   UROBILINOGEN 0.2 07/24/2011 1033   NITRITE NEGATIVE 09/24/2023 0300   LEUKOCYTESUR NEGATIVE 09/24/2023 0300    Radiological Exams on Admission: CT Angio Chest PE W and/or Wo Contrast Result Date: 12/14/2023 CLINICAL DATA:  Elevated D-dimer level. Shortness of breath and hypoxia. EXAM: CT ANGIOGRAPHY CHEST WITH CONTRAST TECHNIQUE: Multidetector CT imaging of the chest was performed using the standard protocol during bolus administration of intravenous contrast. Multiplanar CT image reconstructions and MIPs were obtained to evaluate the vascular anatomy. RADIATION DOSE REDUCTION: This exam was performed according to the departmental dose-optimization program which includes automated exposure control, adjustment of the mA and/or kV according to patient size  and/or use of iterative reconstruction technique. CONTRAST:  75mL OMNIPAQUE  IOHEXOL  350 MG/ML SOLN COMPARISON:  09/23/2023 FINDINGS: Cardiovascular: No filling defect is identified in the pulmonary arterial tree to suggest pulmonary embolus. Prominent main pulmonary artery at 4.1 cm diameter, suspicious for pulmonary arterial hypertension. Mild atheromatous vascular calcification of the aortic arch. Mild cardiomegaly. Mediastinum/Nodes: Mildly enlarged right lower paratracheal node 1.1 cm in short axis on image 56 series 6.  Other upper normal sized mediastinal lymph nodes are observed. Mildly enlarged 1.1 cm in short axis right hilar lymph node, image 67 series 6. These are similar to the 09/23/2023 exam. Mild prominence of mediastinal adipose tissues. Lungs/Pleura: Continued bilateral patchy and confluent ground-glass opacities in the lungs, with increase in right upper lobe involvement but mildly decreased left lung and right lower lobe involvement possibilities including pulmonary edema, atypical pneumonia, or acute hypersensitivity pneumonitis. Given the small bilateral pleural effusions, pulmonary edema is favored. Upper Abdomen: Mildly lobulated hepatic contour, cannot exclude cirrhosis. Musculoskeletal: Thoracic spondylosis. Review of the MIP images confirms the above findings. IMPRESSION: 1. No filling defect is identified in the pulmonary arterial tree to suggest pulmonary embolus. 2. Continued bilateral patchy and confluent ground-glass opacities in the lungs, with increase in right upper lobe involvement but mildly decreased left lung and right lower lobe involvement. Possibilities include pulmonary edema (favored), atypical pneumonia, or acute hypersensitivity pneumonitis. 3. Small bilateral pleural effusions. 4. Mild cardiomegaly. 5. Prominent main pulmonary artery at 4.1 cm diameter, suspicious for pulmonary arterial hypertension. 6. Mildly enlarged right lower paratracheal and right hilar lymph nodes,  similar to the 09/23/2023 exam. 7. Mildly lobulated hepatic contour, cannot exclude cirrhosis. 8. Thoracic spondylosis. 9.  Aortic Atherosclerosis (ICD10-I70.0). Electronically Signed   By: Ryan Salvage M.D.   On: 12/14/2023 11:24   DG Chest Port 1 View Result Date: 12/14/2023 CLINICAL DATA:  Shortness of breath. EXAM: PORTABLE CHEST 1 VIEW COMPARISON:  October 29, 2023. FINDINGS: Stable cardiomediastinal silhouette. Left lung is clear. Right lower lobe airspace opacity is noted concerning for pneumonia. Bony thorax is unremarkable. IMPRESSION: Right lower lobe airspace opacity is noted concerning for pneumonia. Electronically Signed   By: Lynwood Landy Raddle M.D.   On: 12/14/2023 07:57    Assessment and Plan  Acute hypoxemic respiratory failure SIRS vs ?sepsis Patient is currently requiring 2 L  to maintain sats in the 90s. CTA chest negative for PE but showing findings concerning for pulmonary edema versus atypical pneumonia versus acute hypersensitivity pneumonitis.  Also showing small bilateral pleural effusions.  Patient does not appear volume overloaded on exam.  proBNP 320.  Last echo done 09/28/2023 showing EF 60 to 65%, normal diastolic parameters, and trivial mitral regurgitation.  Pneumonia more likely given fever and mild leukocytosis.  Mild tachycardia has now resolved.  No hypotension. -Continue ceftriaxone  and azithromycin  -IV Lasix  20 mg x 1 -Full 20 pathogen respiratory viral panel ordered -Continue supplemental oxygen, wean as tolerated -Trend WBC count -Check procalcitonin level -Check lactate -Follow-up blood cultures  Mild thrombocytopenia In the setting of infection.  Continue to monitor labs.  Asthma Stable, no wheezing.  He is on biweekly Dupixent  injections.  Continue Singulair , Breo, and albuterol  as needed.  Hyperlipidemia He is on biweekly Repatha  injections.  Anxiety Continue Celexa .  Patient states he does not take Klonopin .  GERD Continue  Protonix .  OSA Continue CPAP at night.  History of alcohol  use No signs of withdrawal at this time.  Placed on CIWA monitoring.  Home medications initiated per patient's reported history.  Awaiting pharmacy med rec for verification.   DVT prophylaxis: Lovenox Code Status: Full Code (discussed with the patient) Level of care: Telemetry bed Admission status: It is my clinical opinion that referral for OBSERVATION is reasonable and necessary in this patient based on the above information provided. The aforementioned taken together are felt to place the patient at high risk for further clinical deterioration. However, it is anticipated that the patient  may be medically stable for discharge from the hospital within 24 to 48 hours.  Editha Ram MD [facility]  If 7PM-7AM, please contact night-coverage [email or web address]  12/15/2023, 12:50 AM

## 2023-12-15 NOTE — Progress Notes (Signed)
 PHARMACY - PHYSICIAN COMMUNICATION CRITICAL VALUE ALERT - BLOOD CULTURE IDENTIFICATION (BCID)  Ryan Holland is an 53 y.o. male who presented to [facility] on 12/14/2023 with a chief complaint of SOB and low grade fever. He is currently on ceftriaxone  and azithromycin  for PNA.   One of two blood cx bottles collected on 12/14/23 at 0731 now has GPC (BCID= staph epi, mecA/C+).    Name of physician (or Provider) Contacted: Dr. Vernon  Current antibiotics: ceftriaxone  and azithromycin    Changes to prescribed antibiotics recommended:  - per Dr. Vernon via [facility] msg, he would like pharmacy to dose vancomycin for pt.   - vancomycin 2000 mg IV x1, then 1500 mg q12h for est AUC 501  ______________________________________________  Results for orders placed or performed during the hospital encounter of 12/14/23  Blood Culture ID Panel (Reflexed) (Collected: 12/14/2023  7:31 AM)  Result Value Ref Range   Enterococcus faecalis NOT DETECTED NOT DETECTED   Enterococcus Faecium NOT DETECTED NOT DETECTED   Listeria monocytogenes NOT DETECTED NOT DETECTED   Staphylococcus species DETECTED (A) NOT DETECTED   Staphylococcus aureus (BCID) NOT DETECTED NOT DETECTED   Staphylococcus epidermidis DETECTED (A) NOT DETECTED   Staphylococcus lugdunensis NOT DETECTED NOT DETECTED   Streptococcus species NOT DETECTED NOT DETECTED   Streptococcus agalactiae NOT DETECTED NOT DETECTED   Streptococcus pneumoniae NOT DETECTED NOT DETECTED   Streptococcus pyogenes NOT DETECTED NOT DETECTED   A.calcoaceticus-baumannii NOT DETECTED NOT DETECTED   Bacteroides fragilis NOT DETECTED NOT DETECTED   Enterobacterales NOT DETECTED NOT DETECTED   Enterobacter cloacae complex NOT DETECTED NOT DETECTED   Escherichia coli NOT DETECTED NOT DETECTED   Klebsiella aerogenes NOT DETECTED NOT DETECTED   Klebsiella oxytoca NOT DETECTED NOT DETECTED   Klebsiella pneumoniae NOT DETECTED NOT DETECTED   Proteus species NOT DETECTED  NOT DETECTED   Salmonella species NOT DETECTED NOT DETECTED   Serratia marcescens NOT DETECTED NOT DETECTED   Haemophilus influenzae NOT DETECTED NOT DETECTED   Neisseria meningitidis NOT DETECTED NOT DETECTED   Pseudomonas aeruginosa NOT DETECTED NOT DETECTED   Stenotrophomonas maltophilia NOT DETECTED NOT DETECTED   Candida albicans NOT DETECTED NOT DETECTED   Candida auris NOT DETECTED NOT DETECTED   Candida glabrata NOT DETECTED NOT DETECTED   Candida krusei NOT DETECTED NOT DETECTED   Candida parapsilosis NOT DETECTED NOT DETECTED   Candida tropicalis NOT DETECTED NOT DETECTED   Cryptococcus neoformans/gattii NOT DETECTED NOT DETECTED   Methicillin resistance mecA/C DETECTED (A) NOT DETECTED    Ryan Holland P 12/15/2023  10:17 AM

## 2023-12-15 NOTE — Progress Notes (Signed)
 PROGRESS NOTE    Ryan Holland  FMW:[id number] DOB: November 19, 1970 DOA: 12/14/2023 PCP: Dorina Loving, [name]   Brief Narrative:  HPI: Ryan Holland is a 53 y.o. male with medical history significant of hypertension, hyperlipidemia, anxiety, GERD, OSA on CPAP, class III obesity, asthma, alcohol  use disorder, hepatic steatosis, alcoholic hepatitis, splenomegaly, thrombocytopenia, hyponatremia presented with a chief complaint of shortness of breath.  Patient states he was admitted to the hospital for pneumonia back in July and was discharged on a steroid taper.  He symptoms initially improved but then he started having shortness of breath again so he was seen by pulmonology in early September and started on a 6-week prednisone  taper which he is about to finish this week but for the past few days he is having increasing shortness of breath and hypoxia.  His pulse ox was as low as 70s at home.  When he left the hospital in July, he was sent home on supplemental oxygen which he did not require until a few days ago.  He is using 2 L nasal cannula.  Also having low-grade fevers and chills for the past few days.  No cough or chest pain.   ED Course: Febrile and tachycardic on arrival.  Labs notable for WBC count 11.6, platelet count 126k, blood cultures in process, troponin negative, COVID/influenza/RSV PCR negative, proBNP 320, D-dimer 0.65.  CTA chest negative for PE but showing findings concerning for pulmonary edema versus atypical pneumonia versus acute hypersensitivity pneumonitis.  Also showing small bilateral pleural effusions.  EKG showing sinus tachycardia and no acute ischemic changes.  Patient was given Tylenol , DuoNeb, ceftriaxone , and azithromycin .  Assessment & Plan:   Principal Problem:   Acute respiratory failure with hypoxia (HCC) Active Problems:   OSA on CPAP   SIRS (systemic inflammatory response syndrome) (HCC)   Thrombocytopenia   Asthma, chronic  Acute on chronic hypoxemic  respiratory failure and severe sepsis secondary to healthcare associated pneumonia, POA, Meets criteria for healthcare associated pneumonia due to being hospitalized for 6 days with the last 90 days.  Patient was discharged from the hospital on 09/29/2023 with 2 L of oxygen however he was not using that until few days ago.  Upon arrival, he briefly required 4 L of oxygen and now he is back to 2 L of oxygen.  D-dimer positive but CTA chest negative for PE but showing findings concerning for pulmonary edema versus atypical pneumonia versus acute hypersensitivity pneumonitis.  proBNP 320.  Echo done in July shows normal ejection fraction and no diastolic dysfunction.  Tested negative for COVID, RSV, flu and respiratory viral panel.  Given 1 dose of IV Lasix  20 mg in the ED.  Continue ceftriaxone  and azithromycin  and follow cultures. Micro lab just informed us  that one of two blood cx bottles collected on 12/14/23 at 0731 now has GPC (BCID= staph epi, mecA/C+).  Will add vancomycin as well.   Mild thrombocytopenia In the setting of infection.  Resolved.   Asthma Stable, no wheezing.  He is on biweekly Dupixent  injections.  Continue Singulair , Breo, and albuterol  as needed.   Hyperlipidemia He is on biweekly Repatha  injections.   Anxiety Continue Celexa .  Patient states he does not take Klonopin .   GERD Continue Protonix .   OSA Continue CPAP at night.   History of alcohol  use No signs of withdrawal at this time.  Placed on CIWA monitoring.  Elevated LFTs/hyperbilirubinemia: Patient has been having elevated LFTs and hyperbilirubinemia since 2 to 3 months, AST is actually improving  and ALT has normalized and bilirubin improving.  He underwent CT abdomen pelvis during previous hospitalization which showed normal liver.  Monitor closely.  Class III morbid obesity: BMI almost 40.  Weight loss and dietary modification  counseled.  DVT prophylaxis: Lovenox   Code Status: Full Code  Family  Communication:  None present at bedside.  Plan of care discussed with patient in length and he/she verbalized understanding and agreed with it.  Status is: Observation The patient will require care spanning > 2 midnights and should be moved to inpatient because: Still hypoxic and symptomatic.   Estimated body mass index is 38.25 kg/m as calculated from the following:   Height as of 10/30/23: 5' 10 (1.778 m).   Weight as of 10/30/23: 120.9 kg.    Nutritional Assessment: There is no height or weight on file to calculate BMI.. Seen by dietician.  I agree with the assessment and plan as outlined below: Nutrition Status:        . Skin Assessment: I have examined the patient's skin and I agree with the wound assessment as performed by the wound care RN as outlined below:    Consultants:  None  Procedures:  None  Antimicrobials:  Anti-infectives (From admission, onward)    Start     Dose/Rate Route Frequency Ordered Stop   12/15/23 0800  azithromycin  (ZITHROMAX ) 500 mg in sodium chloride  0.9 % 250 mL IVPB        500 mg 250 mL/hr over 60 Minutes Intravenous Every 24 hours 12/15/23 0154     12/15/23 0800  cefTRIAXone  (ROCEPHIN ) 2 g in sodium chloride  0.9 % 100 mL IVPB        2 g 200 mL/hr over 30 Minutes Intravenous Every 24 hours 12/15/23 0154     12/14/23 0830  cefTRIAXone  (ROCEPHIN ) 2 g in sodium chloride  0.9 % 100 mL IVPB        2 g 200 mL/hr over 30 Minutes Intravenous  Once 12/14/23 0825 12/14/23 0920   12/14/23 0830  azithromycin  (ZITHROMAX ) 500 mg in sodium chloride  0.9 % 250 mL IVPB        500 mg 250 mL/hr over 60 Minutes Intravenous  Once 12/14/23 0825 12/14/23 1027         Subjective: Patient seen and examined, he says that he is not feeling any different than yesterday.  At rest, he has no problem however with ambulation, he feels short of breath.  He is able to speak in full sentences.  Objective: Vitals:   12/14/23 2229 12/15/23 0234 12/15/23 0423 12/15/23  0645  BP: (!) 142/81 134/84  (!) 153/92  Pulse: 90 80 86 75  Resp: 20 18 18 18   Temp: 98.7 F (37.1 C) 98.4 F (36.9 C)  97.7 F (36.5 C)  TempSrc: Oral Oral  Oral  SpO2: 93% 98% 92% 98%    Intake/Output Summary (Last 24 hours) at 12/15/2023 0805 Last data filed at 12/15/2023 0600 Gross per 24 hour  Intake 501.4 ml  Output 1500 ml  Net -998.6 ml   There were no vitals filed for this visit.  Examination:  General exam: Appears calm and comfortable, obese. Respiratory system: Clear to auscultation. Respiratory effort normal. Cardiovascular system: S1 & S2 heard, RRR. No JVD, murmurs, rubs, gallops or clicks. No pedal edema. Gastrointestinal system: Abdomen is nondistended, soft and nontender. No organomegaly or masses felt. Normal bowel sounds heard. Central nervous system: Alert and oriented. No focal neurological deficits. Extremities: Symmetric 5 x 5 power.  Skin: No rashes, lesions or ulcers Psychiatry: Judgement and insight appear normal. Mood & affect appropriate.    Data Reviewed: I have personally reviewed following labs and imaging studies  CBC: Recent Labs  Lab 12/14/23 0735 12/15/23 0209  WBC 11.6* 10.4  HGB 13.8 12.8*  HCT 39.0 38.8*  MCV 96.3 100.8*  PLT 126* 155   Basic Metabolic Panel: Recent Labs  Lab 12/14/23 0735 12/15/23 0209  NA 138 135  K 4.1 3.7  CL 104 104  CO2 22 22  GLUCOSE 144* 109*  BUN 14 13  CREATININE 0.80 0.82  CALCIUM  9.2 8.9   GFR: CrCl cannot be calculated (Unknown ideal weight.). Liver Function Tests: Recent Labs  Lab 12/15/23 0209  AST 47*  ALT 33  ALKPHOS 171*  BILITOT 2.3*  PROT 6.2*  ALBUMIN 3.3*   No results for input(s): LIPASE, AMYLASE in the last 168 hours. No results for input(s): AMMONIA in the last 168 hours. Coagulation Profile: No results for input(s): INR, PROTIME in the last 168 hours. Cardiac Enzymes: No results for input(s): CKTOTAL, CKMB, CKMBINDEX, TROPONINI in the last  168 hours. BNP (last 3 results) Recent Labs    10/30/23 1540 12/14/23 0735  PROBNP 34.0 320.0*   HbA1C: No results for input(s): HGBA1C in the last 72 hours. CBG: No results for input(s): GLUCAP in the last 168 hours. Lipid Profile: No results for input(s): CHOL, HDL, LDLCALC, TRIG, CHOLHDL, LDLDIRECT in the last 72 hours. Thyroid  Function Tests: No results for input(s): TSH, T4TOTAL, FREET4, T3FREE, THYROIDAB in the last 72 hours. Anemia Panel: No results for input(s): VITAMINB12, FOLATE, FERRITIN, TIBC, IRON, RETICCTPCT in the last 72 hours. Sepsis Labs: Recent Labs  Lab 12/15/23 0209  PROCALCITON 1.60  LATICACIDVEN 1.0    Recent Results (from the past 240 hours)  Resp panel by RT-PCR (RSV, Flu A&B, Covid) Anterior Nasal Swab     Status: None   Collection Time: 12/14/23  7:35 AM   Specimen: Anterior Nasal Swab  Result Value Ref Range Status   SARS Coronavirus 2 by RT PCR NEGATIVE NEGATIVE Final    Comment: (NOTE) SARS-CoV-2 target nucleic acids are NOT DETECTED.  The SARS-CoV-2 RNA is generally detectable in upper respiratory specimens during the acute phase of infection. The lowest concentration of SARS-CoV-2 viral copies this assay can detect is 138 copies/mL. A negative result does not preclude SARS-Cov-2 infection and should not be used as the sole basis for treatment or other patient management decisions. A negative result may occur with  improper specimen collection/handling, submission of specimen other than nasopharyngeal swab, presence of viral mutation(s) within the areas targeted by this assay, and inadequate number of viral copies(<138 copies/mL). A negative result must be combined with clinical observations, patient history, and epidemiological information. The expected result is Negative.  Fact Sheet for Patients:  BloggerCourse.com  Fact Sheet for Healthcare Providers:   SeriousBroker.it  This test is no t yet approved or cleared by the [place]  FDA and  has been authorized for detection and/or diagnosis of SARS-CoV-2 by FDA under an Emergency Use Authorization (EUA). This EUA will remain  in effect (meaning this test can be used) for the duration of the COVID-19 declaration under Section 564(b)(1) of the Act, 21 U.S.C.section 360bbb-3(b)(1), unless the authorization is terminated  or revoked sooner.       Influenza A by PCR NEGATIVE NEGATIVE Final   Influenza B by PCR NEGATIVE NEGATIVE Final    Comment: (NOTE) The Xpert Xpress SARS-CoV-2/FLU/RSV plus  assay is intended as an aid in the diagnosis of influenza from Nasopharyngeal swab specimens and should not be used as a sole basis for treatment. Nasal washings and aspirates are unacceptable for Xpert Xpress SARS-CoV-2/FLU/RSV testing.  Fact Sheet for Patients: BloggerCourse.com  Fact Sheet for Healthcare Providers: SeriousBroker.it  This test is not yet approved or cleared by the [place]  FDA and has been authorized for detection and/or diagnosis of SARS-CoV-2 by FDA under an Emergency Use Authorization (EUA). This EUA will remain in effect (meaning this test can be used) for the duration of the COVID-19 declaration under Section 564(b)(1) of the Act, 21 U.S.C. section 360bbb-3(b)(1), unless the authorization is terminated or revoked.     Resp Syncytial Virus by PCR NEGATIVE NEGATIVE Final    Comment: (NOTE) Fact Sheet for Patients: BloggerCourse.com  Fact Sheet for Healthcare Providers: SeriousBroker.it  This test is not yet approved or cleared by the [place]  FDA and has been authorized for detection and/or diagnosis of SARS-CoV-2 by FDA under an Emergency Use Authorization (EUA). This EUA will remain in effect (meaning this test can be used) for  the duration of the COVID-19 declaration under Section 564(b)(1) of the Act, 21 U.S.C. section 360bbb-3(b)(1), unless the authorization is terminated or revoked.  Performed at [facility], [address] [place], [name]   Blood culture (routine x 2)     Status: None (Preliminary result)   Collection Time: 12/14/23 11:59 PM   Specimen: BLOOD LEFT ARM  Result Value Ref Range Status   Specimen Description   Final    BLOOD LEFT ARM Performed at [facility], [address], [place], [place] [address]    Special Requests   Final    BOTTLES DRAWN AEROBIC AND ANAEROBIC Blood Culture adequate volume Performed at [facility], [address], [place], [place] [address]    Culture PENDING  Incomplete   Report Status PENDING  Incomplete  Respiratory (~20 pathogens) panel by PCR     Status: None   Collection Time: 12/15/23  2:49 AM   Specimen: Nasopharyngeal Swab; Respiratory  Result Value Ref Range Status   Adenovirus NOT DETECTED NOT DETECTED Final   Coronavirus 229E NOT DETECTED NOT DETECTED Final    Comment: (NOTE) The Coronavirus on the Respiratory Panel, DOES NOT test for the novel  Coronavirus (2019 nCoV)    Coronavirus HKU1 NOT DETECTED NOT DETECTED Final   Coronavirus NL63 NOT DETECTED NOT DETECTED Final   Coronavirus OC43 NOT DETECTED NOT DETECTED Final   Metapneumovirus NOT DETECTED NOT DETECTED Final   Rhinovirus / Enterovirus NOT DETECTED NOT DETECTED Final   Influenza A NOT DETECTED NOT DETECTED Final   Influenza B NOT DETECTED NOT DETECTED Final   Parainfluenza Virus 1 NOT DETECTED NOT DETECTED Final   Parainfluenza Virus 2 NOT DETECTED NOT DETECTED Final   Parainfluenza Virus 3 NOT DETECTED NOT DETECTED Final   Parainfluenza Virus 4 NOT DETECTED NOT DETECTED Final   Respiratory Syncytial Virus NOT DETECTED NOT DETECTED Final   Bordetella pertussis NOT DETECTED NOT DETECTED Final   Bordetella Parapertussis NOT DETECTED  NOT DETECTED Final   Chlamydophila pneumoniae NOT DETECTED NOT DETECTED Final   Mycoplasma pneumoniae NOT DETECTED NOT DETECTED Final    Comment: Performed at [facility], [address] [place], [place] [address]     Radiology Studies: CT Angio Chest PE W and/or Wo Contrast Result Date: 12/14/2023 CLINICAL DATA:  Elevated D-dimer level.  Shortness of breath and hypoxia. EXAM: CT ANGIOGRAPHY CHEST WITH CONTRAST TECHNIQUE: Multidetector CT imaging of the chest was performed using the standard protocol during bolus administration of intravenous contrast. Multiplanar CT image reconstructions and MIPs were obtained to evaluate the vascular anatomy. RADIATION DOSE REDUCTION: This exam was performed according to the departmental dose-optimization program which includes automated exposure control, adjustment of the mA and/or kV according to patient size and/or use of iterative reconstruction technique. CONTRAST:  75mL OMNIPAQUE  IOHEXOL  350 MG/ML SOLN COMPARISON:  09/23/2023 FINDINGS: Cardiovascular: No filling defect is identified in the pulmonary arterial tree to suggest pulmonary embolus. Prominent main pulmonary artery at 4.1 cm diameter, suspicious for pulmonary arterial hypertension. Mild atheromatous vascular calcification of the aortic arch. Mild cardiomegaly. Mediastinum/Nodes: Mildly enlarged right lower paratracheal node 1.1 cm in short axis on image 56 series 6. Other upper normal sized mediastinal lymph nodes are observed. Mildly enlarged 1.1 cm in short axis right hilar lymph node, image 67 series 6. These are similar to the 09/23/2023 exam. Mild prominence of mediastinal adipose tissues. Lungs/Pleura: Continued bilateral patchy and confluent ground-glass opacities in the lungs, with increase in right upper lobe involvement but mildly decreased left lung and right lower lobe involvement possibilities including pulmonary edema, atypical pneumonia, or acute hypersensitivity pneumonitis. Given the  small bilateral pleural effusions, pulmonary edema is favored. Upper Abdomen: Mildly lobulated hepatic contour, cannot exclude cirrhosis. Musculoskeletal: Thoracic spondylosis. Review of the MIP images confirms the above findings. IMPRESSION: 1. No filling defect is identified in the pulmonary arterial tree to suggest pulmonary embolus. 2. Continued bilateral patchy and confluent ground-glass opacities in the lungs, with increase in right upper lobe involvement but mildly decreased left lung and right lower lobe involvement. Possibilities include pulmonary edema (favored), atypical pneumonia, or acute hypersensitivity pneumonitis. 3. Small bilateral pleural effusions. 4. Mild cardiomegaly. 5. Prominent main pulmonary artery at 4.1 cm diameter, suspicious for pulmonary arterial hypertension. 6. Mildly enlarged right lower paratracheal and right hilar lymph nodes, similar to the 09/23/2023 exam. 7. Mildly lobulated hepatic contour, cannot exclude cirrhosis. 8. Thoracic spondylosis. 9.  Aortic Atherosclerosis (ICD10-I70.0). Electronically Signed   By: Ryan Salvage M.D.   On: 12/14/2023 11:24   DG Chest Port 1 View Result Date: 12/14/2023 CLINICAL DATA:  Shortness of breath. EXAM: PORTABLE CHEST 1 VIEW COMPARISON:  October 29, 2023. FINDINGS: Stable cardiomediastinal silhouette. Left lung is clear. Right lower lobe airspace opacity is noted concerning for pneumonia. Bony thorax is unremarkable. IMPRESSION: Right lower lobe airspace opacity is noted concerning for pneumonia. Electronically Signed   By: Lynwood Landy Raddle M.D.   On: 12/14/2023 07:57    Scheduled Meds:  citalopram   40 mg Oral Daily   enoxaparin (LOVENOX) injection  60 mg Subcutaneous Q24H   fluticasone   2 spray Each Nare Daily   fluticasone  furoate-vilanterol  1 puff Inhalation Daily   montelukast   10 mg Oral Daily   pantoprazole   40 mg Oral Daily   Continuous Infusions:  azithromycin  (ZITHROMAX ) 500 mg in sodium chloride  0.9 % 250 mL IVPB      cefTRIAXone  (ROCEPHIN )  IV       LOS: 0 days   Fredia Skeeter, MD [facility]  12/15/2023, 8:05 AM  Total time spent 45 minutes *Please note that this is a verbal dictation therefore any spelling or grammatical errors are due to the Dragon Medical One system interpretation.  Please page via Amion and do not message via secure chat for urgent patient care matters. Secure chat can be  used for non urgent patient care matters.  How to contact the [facility] Attending or Consulting provider 7A - 7P or covering provider during after hours 7P -7A, for this patient?  Check the care team in [facility] and look for a) attending/consulting [facility] provider listed and b) the [facility] team listed. Page or secure chat 7A-7P. Log into [email or web address] and use [facility] universal password to access. If you do not have the password, please contact the hospital operator. Locate the [facility] provider you are looking for under [facility] and page to a number that you can be directly reached. If you still have difficulty reaching the provider, please page the [facility] (Director on Call) for the Hospitalists listed on amion for assistance.

## 2023-12-15 NOTE — Progress Notes (Signed)
   12/15/23 0423  BiPAP/CPAP/SIPAP  $ Non-Invasive Home Ventilator  Initial  $ Face Mask Large  Yes  BiPAP/CPAP/SIPAP Pt Type Adult  BiPAP/CPAP/SIPAP Resmed  Mask Type Full face mask  Dentures removed? Not applicable  Mask Size Large  EPAP 10 cmH2O  Flow Rate 2 lpm  Patient Home Machine No  Patient Home Mask No  Patient Home Tubing No  Auto Titrate No  Nasal massage performed Yes  CPAP/SIPAP surface wiped down Yes  Device Plugged into RED Power Outlet Yes  BiPAP/CPAP /SiPAP Vitals  Pulse Rate 86  SpO2 92 %  Bilateral Breath Sounds Clear;Diminished

## 2023-12-15 NOTE — Progress Notes (Signed)
   12/15/23 2304  BiPAP/CPAP/SIPAP  BiPAP/CPAP/SIPAP Pt Type Adult  BiPAP/CPAP/SIPAP Resmed  Mask Type Full face mask  Dentures removed? Not applicable  Mask Size Large  Respiratory Rate 16 breaths/min  EPAP 10 cmH2O  Flow Rate 2 lpm  Patient Home Machine No  Patient Home Mask No  Patient Home Tubing No  Auto Titrate No  Device Plugged into RED Power Outlet Yes  BiPAP/CPAP /SiPAP Vitals  Pulse Rate 90  Resp 16  SpO2 98 %  Bilateral Breath Sounds Clear;Diminished

## 2023-12-16 DIAGNOSIS — J9601 Acute respiratory failure with hypoxia: Secondary | ICD-10-CM | POA: Diagnosis not present

## 2023-12-16 LAB — CBC WITH DIFFERENTIAL/PLATELET
Abs Immature Granulocytes: 0.21 K/uL — ABNORMAL HIGH (ref 0.00–0.07)
Basophils Absolute: 0.1 K/uL (ref 0.0–0.1)
Basophils Relative: 1 %
Eosinophils Absolute: 0.8 K/uL — ABNORMAL HIGH (ref 0.0–0.5)
Eosinophils Relative: 7 %
HCT: 41.4 % (ref 39.0–52.0)
Hemoglobin: 14.3 g/dL (ref 13.0–17.0)
Immature Granulocytes: 2 %
Lymphocytes Relative: 14 %
Lymphs Abs: 1.5 K/uL (ref 0.7–4.0)
MCH: 33.7 pg (ref 26.0–34.0)
MCHC: 34.5 g/dL (ref 30.0–36.0)
MCV: 97.6 fL (ref 80.0–100.0)
Monocytes Absolute: 1.1 K/uL — ABNORMAL HIGH (ref 0.1–1.0)
Monocytes Relative: 10 %
Neutro Abs: 7 K/uL (ref 1.7–7.7)
Neutrophils Relative %: 66 %
Platelets: 187 K/uL (ref 150–400)
RBC: 4.24 MIL/uL (ref 4.22–5.81)
RDW: 12.6 % (ref 11.5–15.5)
WBC: 10.6 K/uL — ABNORMAL HIGH (ref 4.0–10.5)
nRBC: 0 % (ref 0.0–0.2)

## 2023-12-16 LAB — BASIC METABOLIC PANEL WITH GFR
Anion gap: 13 (ref 5–15)
BUN: 13 mg/dL (ref 6–20)
CO2: 22 mmol/L (ref 22–32)
Calcium: 9.9 mg/dL (ref 8.9–10.3)
Chloride: 102 mmol/L (ref 98–111)
Creatinine, Ser: 0.82 mg/dL (ref 0.61–1.24)
GFR, Estimated: >60 mL/min (ref >60)
Glucose, Bld: 112 mg/dL — ABNORMAL HIGH (ref 70–99)
Potassium: 3.8 mmol/L (ref 3.5–5.1)
Sodium: 136 mmol/L (ref 135–145)

## 2023-12-16 LAB — LEGIONELLA PNEUMOPHILA SEROGP 1 UR AG: L. pneumophila Serogp 1 Ur Ag: NEGATIVE

## 2023-12-16 MED ORDER — ACETAMINOPHEN 325 MG PO TABS
650.0000 mg | ORAL_TABLET | Freq: Four times a day (QID) | ORAL | Status: DC | PRN
  Administered 2023-12-16 – 2023-12-17 (×4): 650 mg via ORAL
  Filled 2023-12-16 (×4): qty 2

## 2023-12-16 MED ORDER — POLYVINYL ALCOHOL 1.4 % OP SOLN
1.0000 [drp] | OPHTHALMIC | Status: DC | PRN
  Administered 2023-12-16: 1 [drp] via OPHTHALMIC
  Filled 2023-12-16: qty 15

## 2023-12-16 MED ORDER — FUROSEMIDE 10 MG/ML IJ SOLN
40.0000 mg | Freq: Once | INTRAMUSCULAR | Status: AC
  Administered 2023-12-16: 40 mg via INTRAVENOUS
  Filled 2023-12-16: qty 4

## 2023-12-16 NOTE — Progress Notes (Signed)
   12/16/23 2307  BiPAP/CPAP/SIPAP  BiPAP/CPAP/SIPAP Pt Type Adult  BiPAP/CPAP/SIPAP Resmed  Mask Type Full face mask  Dentures removed? Not applicable  Mask Size Large  Respiratory Rate 18 breaths/min  Flow Rate 2 lpm  Patient Home Machine No  Patient Home Mask No  Patient Home Tubing No  Auto Titrate (S)  Yes  Minimum cmH2O 4 cmH2O  Maximum cmH2O 15 cmH2O  Device Plugged into RED Power Outlet Yes  BiPAP/CPAP /SiPAP Vitals  Pulse Rate 61  Resp 18  SpO2 94 %  Bilateral Breath Sounds Clear;Diminished

## 2023-12-16 NOTE — Progress Notes (Signed)
 PROGRESS NOTE    Ryan Holland  FMW:[id number] DOB: 09-20-1970 DOA: 12/14/2023 PCP: Dorina Loving, [name]   Brief Narrative:  HPI: Ryan Holland is a 53 y.o. male with medical history significant of hypertension, hyperlipidemia, anxiety, GERD, OSA on CPAP, class III obesity, asthma, alcohol  use disorder, hepatic steatosis, alcoholic hepatitis, splenomegaly, thrombocytopenia, hyponatremia presented with a chief complaint of shortness of breath.  Patient states he was admitted to the hospital for pneumonia back in July and was discharged on a steroid taper.  He symptoms initially improved but then he started having shortness of breath again so he was seen by pulmonology in early September and started on a 6-week prednisone  taper which he is about to finish this week but for the past few days he is having increasing shortness of breath and hypoxia.  His pulse ox was as low as 70s at home.  When he left the hospital in July, he was sent home on supplemental oxygen which he did not require until a few days ago.  He is using 2 L nasal cannula.  Also having low-grade fevers and chills for the past few days.  No cough or chest pain.   ED Course: Febrile and tachycardic on arrival.  Labs notable for WBC count 11.6, platelet count 126k, blood cultures in process, troponin negative, COVID/influenza/RSV PCR negative, proBNP 320, D-dimer 0.65.  CTA chest negative for PE but showing findings concerning for pulmonary edema versus atypical pneumonia versus acute hypersensitivity pneumonitis.  Also showing small bilateral pleural effusions.  EKG showing sinus tachycardia and no acute ischemic changes.  Patient was given Tylenol , DuoNeb, ceftriaxone , and azithromycin .  Assessment & Plan:   Principal Problem:   Acute respiratory failure with hypoxia (HCC) Active Problems:   OSA on CPAP   SIRS (systemic inflammatory response syndrome) (HCC)   Thrombocytopenia   Asthma, chronic  Acute on chronic hypoxemic  respiratory failure and severe sepsis secondary to healthcare associated pneumonia/gram-positive bacteremia, POA, Meets criteria for healthcare associated pneumonia due to being hospitalized for 6 days with the last 90 days.  Patient was discharged from the hospital on 09/29/2023 with 2 L of oxygen however he was not using that until few days ago.  D-dimer positive but CTA chest negative for PE but showing findings concerning for pulmonary edema versus atypical pneumonia versus acute hypersensitivity pneumonitis.  proBNP 320.  Echo done in July shows normal ejection fraction and no diastolic dysfunction.  Tested negative for COVID, RSV, flu and respiratory viral panel.  Given 1 dose of IV Lasix  20 mg in the ED.  one of two blood cx bottles collected on 12/14/23 at 0731 now has GPC (BCID= staph epi, mecA/C+).  Patient is still febrile with last temperature spike of 101.5 around 11 PM on 12/15/2023.  Patient did  require 4 L of oxygen yesterday briefly however he has been weaned to room air and feels comfortable with normal lung exam today.  I will give him another dose of IV Lasix  40 mg today.  Continue Rocephin , Zithromax  and vancomycin.   Mild thrombocytopenia In the setting of infection.  Resolved.   Asthma Stable, no wheezing.  He is on biweekly Dupixent  injections.  Continue Singulair , Breo, and albuterol  as needed.   Hyperlipidemia He is on biweekly Repatha  injections.   Anxiety Continue Celexa .  Patient states he does not take Klonopin .   GERD Continue Protonix .   OSA Continue CPAP at night.   History of alcohol  use No signs of withdrawal at this time.  Placed on CIWA monitoring.  Elevated LFTs/hyperbilirubinemia: Patient has been having elevated LFTs and hyperbilirubinemia since 2 to 3 months, AST is actually improving and ALT has normalized and bilirubin improving.  He underwent CT abdomen pelvis during previous hospitalization which showed normal liver.  Monitor closely.  Class III  morbid obesity: BMI almost 40.  Weight loss and dietary modification  counseled.  DVT prophylaxis: Lovenox   Code Status: Full Code  Family Communication:  None present at bedside.  Plan of care discussed with patient in length and he/she verbalized understanding and agreed with it.  Status is: Inpatient Remains inpatient appropriate because: Still febrile.     Estimated body mass index is 39.5 kg/m as calculated from the following:   Height as of this encounter: 5' 10 (1.778 m).   Weight as of this encounter: 124.9 kg.    Nutritional Assessment: Body mass index is 39.5 kg/m.SABRA Seen by dietician.  I agree with the assessment and plan as outlined below: Nutrition Status:        . Skin Assessment: I have examined the patient's skin and I agree with the wound assessment as performed by the wound care RN as outlined below:    Consultants:  None  Procedures:  None  Antimicrobials:  Anti-infectives (From admission, onward)    Start     Dose/Rate Route Frequency Ordered Stop   12/15/23 2300  vancomycin (VANCOREADY) IVPB 1500 mg/300 mL        1,500 mg 150 mL/hr over 120 Minutes Intravenous Every 12 hours 12/15/23 1030     12/15/23 1100  vancomycin (VANCOREADY) IVPB 2000 mg/400 mL        2,000 mg 200 mL/hr over 120 Minutes Intravenous  Once 12/15/23 1030 12/15/23 1505   12/15/23 0800  azithromycin  (ZITHROMAX ) 500 mg in sodium chloride  0.9 % 250 mL IVPB        500 mg 250 mL/hr over 60 Minutes Intravenous Every 24 hours 12/15/23 0154     12/15/23 0800  cefTRIAXone  (ROCEPHIN ) 2 g in sodium chloride  0.9 % 100 mL IVPB        2 g 200 mL/hr over 30 Minutes Intravenous Every 24 hours 12/15/23 0154     12/14/23 0830  cefTRIAXone  (ROCEPHIN ) 2 g in sodium chloride  0.9 % 100 mL IVPB        2 g 200 mL/hr over 30 Minutes Intravenous  Once 12/14/23 0825 12/14/23 0920   12/14/23 0830  azithromycin  (ZITHROMAX ) 500 mg in sodium chloride  0.9 % 250 mL IVPB        500 mg 250 mL/hr over  60 Minutes Intravenous  Once 12/14/23 0825 12/14/23 1027         Subjective: Patient seen and examined.  Patient sitting up in the bed.  Looks very comfortable.  States that his breathing is improved somewhat.  No new complaint.  Wife at the bedside. Objective: Vitals:   12/15/23 2304 12/16/23 0041 12/16/23 0131 12/16/23 0511  BP:   (!) 140/69 (!) 142/66  Pulse: 90  78 79  Resp: 16 20 20 20   Temp:   99.9 F (37.7 C) 97.8 F (36.6 C)  TempSrc:   Oral Oral  SpO2: 98%  98% 98%  Weight:      Height:        Intake/Output Summary (Last 24 hours) at 12/16/2023 0800 Last data filed at 12/16/2023 0630 Gross per 24 hour  Intake 1508.42 ml  Output 1000 ml  Net 508.42 ml   Filed  Weights   12/15/23 0800  Weight: 124.9 kg    Examination:  General exam: Appears calm and comfortable, obese. Respiratory system: Clear to auscultation. Respiratory effort normal. Cardiovascular system: S1 & S2 heard, RRR. No JVD, murmurs, rubs, gallops or clicks. No pedal edema. Gastrointestinal system: Abdomen is nondistended, soft and nontender. No organomegaly or masses felt. Normal bowel sounds heard. Central nervous system: Alert and oriented. No focal neurological deficits. Extremities: Symmetric 5 x 5 power. Skin: No rashes, lesions or ulcers Psychiatry: Judgement and insight appear normal. Mood & affect appropriate.    Data Reviewed: I have personally reviewed following labs and imaging studies  CBC: Recent Labs  Lab 12/14/23 0735 12/15/23 0209  WBC 11.6* 10.4  HGB 13.8 12.8*  HCT 39.0 38.8*  MCV 96.3 100.8*  PLT 126* 155   Basic Metabolic Panel: Recent Labs  Lab 12/14/23 0735 12/15/23 0209  NA 138 135  K 4.1 3.7  CL 104 104  CO2 22 22  GLUCOSE 144* 109*  BUN 14 13  CREATININE 0.80 0.82  CALCIUM  9.2 8.9   GFR: Estimated Creatinine Clearance: 141.4 mL/min (by C-G formula based on SCr of 0.82 mg/dL). Liver Function Tests: Recent Labs  Lab 12/15/23 0209  AST 47*  ALT  33  ALKPHOS 171*  BILITOT 2.3*  PROT 6.2*  ALBUMIN 3.3*   No results for input(s): LIPASE, AMYLASE in the last 168 hours. No results for input(s): AMMONIA in the last 168 hours. Coagulation Profile: No results for input(s): INR, PROTIME in the last 168 hours. Cardiac Enzymes: No results for input(s): CKTOTAL, CKMB, CKMBINDEX, TROPONINI in the last 168 hours. BNP (last 3 results) Recent Labs    10/30/23 1540 12/14/23 0735  PROBNP 34.0 320.0*   HbA1C: No results for input(s): HGBA1C in the last 72 hours. CBG: No results for input(s): GLUCAP in the last 168 hours. Lipid Profile: No results for input(s): CHOL, HDL, LDLCALC, TRIG, CHOLHDL, LDLDIRECT in the last 72 hours. Thyroid  Function Tests: No results for input(s): TSH, T4TOTAL, FREET4, T3FREE, THYROIDAB in the last 72 hours. Anemia Panel: No results for input(s): VITAMINB12, FOLATE, FERRITIN, TIBC, IRON, RETICCTPCT in the last 72 hours. Sepsis Labs: Recent Labs  Lab 12/15/23 0209  PROCALCITON 1.60  LATICACIDVEN 1.0    Recent Results (from the past 240 hours)  Blood culture (routine x 2)     Status: None (Preliminary result)   Collection Time: 12/14/23  7:31 AM   Specimen: BLOOD  Result Value Ref Range Status   Specimen Description   Final    BLOOD RIGHT ANTECUBITAL Performed at [facility], [address] [place], [name]    Special Requests   Final    BOTTLES DRAWN AEROBIC AND ANAEROBIC Blood Culture adequate volume Performed at [facility], [address] [place], [name]    Culture  Setup Time   Final    GRAM POSITIVE COCCI ANAEROBIC BOTTLE ONLY CRITICAL RESULT CALLED TO, READ BACK BY AND VERIFIED WITH: PHARMD ANH PHAM [phone] AT 1014 BY EC Performed at [facility], [address] [place], [place] [address]    Culture [facility] POSITIVE COCCI  Final   Report Status PENDING  Incomplete  Blood Culture ID  Panel (Reflexed)     Status: Abnormal   Collection Time: 12/14/23  7:31 AM  Result Value Ref Range Status   Enterococcus faecalis NOT DETECTED NOT DETECTED Final   Enterococcus Faecium NOT DETECTED NOT DETECTED Final  Listeria monocytogenes NOT DETECTED NOT DETECTED Final   Staphylococcus species DETECTED (A) NOT DETECTED Final    Comment: CRITICAL RESULT CALLED TO, READ BACK BY AND VERIFIED WITH: PHARMD ANH PHAM [phone] AT 1015 BY EC    Staphylococcus aureus (BCID) NOT DETECTED NOT DETECTED Final   Staphylococcus epidermidis DETECTED (A) NOT DETECTED Final    Comment: Methicillin (oxacillin) resistant coagulase negative staphylococcus. Possible blood culture contaminant (unless isolated from more than one blood culture draw or clinical case suggests pathogenicity). No antibiotic treatment is indicated for blood  culture contaminants. CRITICAL RESULT CALLED TO, READ BACK BY AND VERIFIED WITH: PHARMD ANH PHAM [phone] AT 1014 BY EC    Staphylococcus lugdunensis NOT DETECTED NOT DETECTED Final   Streptococcus species NOT DETECTED NOT DETECTED Final   Streptococcus agalactiae NOT DETECTED NOT DETECTED Final   Streptococcus pneumoniae NOT DETECTED NOT DETECTED Final   Streptococcus pyogenes NOT DETECTED NOT DETECTED Final   A.calcoaceticus-baumannii NOT DETECTED NOT DETECTED Final   Bacteroides fragilis NOT DETECTED NOT DETECTED Final   Enterobacterales NOT DETECTED NOT DETECTED Final   Enterobacter cloacae complex NOT DETECTED NOT DETECTED Final   Escherichia coli NOT DETECTED NOT DETECTED Final   Klebsiella aerogenes NOT DETECTED NOT DETECTED Final   Klebsiella oxytoca NOT DETECTED NOT DETECTED Final   Klebsiella pneumoniae NOT DETECTED NOT DETECTED Final   Proteus species NOT DETECTED NOT DETECTED Final   Salmonella species NOT DETECTED NOT DETECTED Final   Serratia marcescens NOT DETECTED NOT DETECTED Final   Haemophilus influenzae NOT DETECTED NOT DETECTED Final   Neisseria  meningitidis NOT DETECTED NOT DETECTED Final   Pseudomonas aeruginosa NOT DETECTED NOT DETECTED Final   Stenotrophomonas maltophilia NOT DETECTED NOT DETECTED Final   Candida albicans NOT DETECTED NOT DETECTED Final   Candida auris NOT DETECTED NOT DETECTED Final   Candida glabrata NOT DETECTED NOT DETECTED Final   Candida krusei NOT DETECTED NOT DETECTED Final   Candida parapsilosis NOT DETECTED NOT DETECTED Final   Candida tropicalis NOT DETECTED NOT DETECTED Final   Cryptococcus neoformans/gattii NOT DETECTED NOT DETECTED Final   Methicillin resistance mecA/C DETECTED (A) NOT DETECTED Final    Comment: CRITICAL RESULT CALLED TO, READ BACK BY AND VERIFIED WITH: PHARMD ANH PHAM [phone] AT 1014 BY EC Performed at [facility], [address], [place], [place] [address]   Resp panel by RT-PCR (RSV, Flu A&B, Covid) Anterior Nasal Swab     Status: None   Collection Time: 12/14/23  7:35 AM   Specimen: Anterior Nasal Swab  Result Value Ref Range Status   SARS Coronavirus 2 by RT PCR NEGATIVE NEGATIVE Final    Comment: (NOTE) SARS-CoV-2 target nucleic acids are NOT DETECTED.  The SARS-CoV-2 RNA is generally detectable in upper respiratory specimens during the acute phase of infection. The lowest concentration of SARS-CoV-2 viral copies this assay can detect is 138 copies/mL. A negative result does not preclude SARS-Cov-2 infection and should not be used as the sole basis for treatment or other patient management decisions. A negative result may occur with  improper specimen collection/handling, submission of specimen other than nasopharyngeal swab, presence of viral mutation(s) within the areas targeted by this assay, and inadequate number of viral copies(<138 copies/mL). A negative result must be combined with clinical observations, patient history, and epidemiological information. The expected result is Negative.  Fact Sheet for Patients:   BloggerCourse.com  Fact Sheet for Healthcare Providers:  SeriousBroker.it  This test is no t yet approved or cleared  by the [place]  FDA and  has been authorized for detection and/or diagnosis of SARS-CoV-2 by FDA under an Emergency Use Authorization (EUA). This EUA will remain  in effect (meaning this test can be used) for the duration of the COVID-19 declaration under Section 564(b)(1) of the Act, 21 U.S.C.section 360bbb-3(b)(1), unless the authorization is terminated  or revoked sooner.       Influenza A by PCR NEGATIVE NEGATIVE Final   Influenza B by PCR NEGATIVE NEGATIVE Final    Comment: (NOTE) The Xpert Xpress SARS-CoV-2/FLU/RSV plus assay is intended as an aid in the diagnosis of influenza from Nasopharyngeal swab specimens and should not be used as a sole basis for treatment. Nasal washings and aspirates are unacceptable for Xpert Xpress SARS-CoV-2/FLU/RSV testing.  Fact Sheet for Patients: BloggerCourse.com  Fact Sheet for Healthcare Providers: SeriousBroker.it  This test is not yet approved or cleared by the [place]  FDA and has been authorized for detection and/or diagnosis of SARS-CoV-2 by FDA under an Emergency Use Authorization (EUA). This EUA will remain in effect (meaning this test can be used) for the duration of the COVID-19 declaration under Section 564(b)(1) of the Act, 21 U.S.C. section 360bbb-3(b)(1), unless the authorization is terminated or revoked.     Resp Syncytial Virus by PCR NEGATIVE NEGATIVE Final    Comment: (NOTE) Fact Sheet for Patients: BloggerCourse.com  Fact Sheet for Healthcare Providers: SeriousBroker.it  This test is not yet approved or cleared by the [place]  FDA and has been authorized for detection and/or diagnosis of SARS-CoV-2 by FDA under an Emergency Use  Authorization (EUA). This EUA will remain in effect (meaning this test can be used) for the duration of the COVID-19 declaration under Section 564(b)(1) of the Act, 21 U.S.C. section 360bbb-3(b)(1), unless the authorization is terminated or revoked.  Performed at [facility], [address] [place], [name]   Blood culture (routine x 2)     Status: None (Preliminary result)   Collection Time: 12/14/23 11:59 PM   Specimen: BLOOD LEFT ARM  Result Value Ref Range Status   Specimen Description   Final    BLOOD LEFT ARM Performed at [facility], [address], [place], [place] [address]    Special Requests   Final    BOTTLES DRAWN AEROBIC AND ANAEROBIC Blood Culture adequate volume Performed at [facility], [address], [place], [place] [address]    Culture   Final    NO GROWTH < 12 HOURS Performed at [facility], [address], [place], [place] [address]    Report Status PENDING  Incomplete  Respiratory (~20 pathogens) panel by PCR     Status: None   Collection Time: 12/15/23  2:49 AM   Specimen: Nasopharyngeal Swab; Respiratory  Result Value Ref Range Status   Adenovirus NOT DETECTED NOT DETECTED Final   Coronavirus 229E NOT DETECTED NOT DETECTED Final    Comment: (NOTE) The Coronavirus on the Respiratory Panel, DOES NOT test for the novel  Coronavirus (2019 nCoV)    Coronavirus HKU1 NOT DETECTED NOT DETECTED Final   Coronavirus NL63 NOT DETECTED NOT DETECTED Final   Coronavirus OC43 NOT DETECTED NOT DETECTED Final   Metapneumovirus NOT DETECTED NOT DETECTED Final   Rhinovirus / Enterovirus NOT DETECTED NOT DETECTED Final   Influenza A NOT DETECTED NOT DETECTED Final   Influenza B NOT DETECTED NOT DETECTED Final   Parainfluenza Virus 1 NOT DETECTED NOT DETECTED Final  Parainfluenza Virus 2 NOT DETECTED NOT DETECTED Final   Parainfluenza Virus 3 NOT DETECTED NOT DETECTED Final   Parainfluenza Virus 4 NOT DETECTED  NOT DETECTED Final   Respiratory Syncytial Virus NOT DETECTED NOT DETECTED Final   Bordetella pertussis NOT DETECTED NOT DETECTED Final   Bordetella Parapertussis NOT DETECTED NOT DETECTED Final   Chlamydophila pneumoniae NOT DETECTED NOT DETECTED Final   Mycoplasma pneumoniae NOT DETECTED NOT DETECTED Final    Comment: Performed at [facility], [address], [place], [place] [address]     Radiology Studies: CT Angio Chest PE W and/or Wo Contrast Result Date: 12/14/2023 CLINICAL DATA:  Elevated D-dimer level. Shortness of breath and hypoxia. EXAM: CT ANGIOGRAPHY CHEST WITH CONTRAST TECHNIQUE: Multidetector CT imaging of the chest was performed using the standard protocol during bolus administration of intravenous contrast. Multiplanar CT image reconstructions and MIPs were obtained to evaluate the vascular anatomy. RADIATION DOSE REDUCTION: This exam was performed according to the departmental dose-optimization program which includes automated exposure control, adjustment of the mA and/or kV according to patient size and/or use of iterative reconstruction technique. CONTRAST:  75mL OMNIPAQUE  IOHEXOL  350 MG/ML SOLN COMPARISON:  09/23/2023 FINDINGS: Cardiovascular: No filling defect is identified in the pulmonary arterial tree to suggest pulmonary embolus. Prominent main pulmonary artery at 4.1 cm diameter, suspicious for pulmonary arterial hypertension. Mild atheromatous vascular calcification of the aortic arch. Mild cardiomegaly. Mediastinum/Nodes: Mildly enlarged right lower paratracheal node 1.1 cm in short axis on image 56 series 6. Other upper normal sized mediastinal lymph nodes are observed. Mildly enlarged 1.1 cm in short axis right hilar lymph node, image 67 series 6. These are similar to the 09/23/2023 exam. Mild prominence of mediastinal adipose tissues. Lungs/Pleura: Continued bilateral patchy and confluent ground-glass opacities in the lungs, with increase in right upper lobe  involvement but mildly decreased left lung and right lower lobe involvement possibilities including pulmonary edema, atypical pneumonia, or acute hypersensitivity pneumonitis. Given the small bilateral pleural effusions, pulmonary edema is favored. Upper Abdomen: Mildly lobulated hepatic contour, cannot exclude cirrhosis. Musculoskeletal: Thoracic spondylosis. Review of the MIP images confirms the above findings. IMPRESSION: 1. No filling defect is identified in the pulmonary arterial tree to suggest pulmonary embolus. 2. Continued bilateral patchy and confluent ground-glass opacities in the lungs, with increase in right upper lobe involvement but mildly decreased left lung and right lower lobe involvement. Possibilities include pulmonary edema (favored), atypical pneumonia, or acute hypersensitivity pneumonitis. 3. Small bilateral pleural effusions. 4. Mild cardiomegaly. 5. Prominent main pulmonary artery at 4.1 cm diameter, suspicious for pulmonary arterial hypertension. 6. Mildly enlarged right lower paratracheal and right hilar lymph nodes, similar to the 09/23/2023 exam. 7. Mildly lobulated hepatic contour, cannot exclude cirrhosis. 8. Thoracic spondylosis. 9.  Aortic Atherosclerosis (ICD10-I70.0). Electronically Signed   By: Ryan Salvage M.D.   On: 12/14/2023 11:24    Scheduled Meds:  citalopram   40 mg Oral Daily   enoxaparin (LOVENOX) injection  60 mg Subcutaneous Q24H   fluticasone   2 spray Each Nare Daily   fluticasone  furoate-vilanterol  1 puff Inhalation Daily   montelukast   10 mg Oral Daily   pantoprazole   40 mg Oral Daily   Continuous Infusions:  azithromycin  (ZITHROMAX ) 500 mg in sodium chloride  0.9 % 250 mL IVPB 500 mg (12/15/23 0945)   cefTRIAXone  (ROCEPHIN )  IV 2 g (12/15/23 0851)   vancomycin 1,500 mg (12/15/23 2302)     LOS: 1 day   Fredia Skeeter, MD [facility]  12/16/2023, 8:00  AM  Total time spent 45 minutes *Please note that this is a verbal dictation  therefore any spelling or grammatical errors are due to the Dragon Medical One system interpretation.  Please page via Amion and do not message via secure chat for urgent patient care matters. Secure chat can be used for non urgent patient care matters.  How to contact the [facility] Attending or Consulting provider 7A - 7P or covering provider during after hours 7P -7A, for this patient?  Check the care team in [facility] and look for a) attending/consulting [facility] provider listed and b) the [facility] team listed. Page or secure chat 7A-7P. Log into [email or web address] and use [place]'s universal password to access. If you do not have the password, please contact the hospital operator. Locate the [facility] provider you are looking for under [facility] and page to a number that you can be directly reached. If you still have difficulty reaching the provider, please page the [facility] [employer] (Director on Call) for the Hospitalists listed on amion for assistance.

## 2023-12-17 ENCOUNTER — Other Ambulatory Visit (HOSPITAL_COMMUNITY): Payer: Self-pay

## 2023-12-17 DIAGNOSIS — J9601 Acute respiratory failure with hypoxia: Secondary | ICD-10-CM | POA: Diagnosis not present

## 2023-12-17 LAB — COMPREHENSIVE METABOLIC PANEL WITH GFR
ALT: 31 U/L (ref 0–44)
AST: 40 U/L (ref 15–41)
Albumin: 3.4 g/dL — ABNORMAL LOW (ref 3.5–5.0)
Alkaline Phosphatase: 201 U/L — ABNORMAL HIGH (ref 38–126)
Anion gap: 10 (ref 5–15)
BUN: 14 mg/dL (ref 6–20)
CO2: 23 mmol/L (ref 22–32)
Calcium: 9.5 mg/dL (ref 8.9–10.3)
Chloride: 103 mmol/L (ref 98–111)
Creatinine, Ser: 0.82 mg/dL (ref 0.61–1.24)
GFR, Estimated: >60 mL/min (ref >60)
Glucose, Bld: 115 mg/dL — ABNORMAL HIGH (ref 70–99)
Potassium: 4 mmol/L (ref 3.5–5.1)
Sodium: 136 mmol/L (ref 135–145)
Total Bilirubin: 1.5 mg/dL — ABNORMAL HIGH (ref 0.0–1.2)
Total Protein: 6.6 g/dL (ref 6.5–8.1)

## 2023-12-17 LAB — CBC WITH DIFFERENTIAL/PLATELET
Abs Immature Granulocytes: 0.29 K/uL — ABNORMAL HIGH (ref 0.00–0.07)
Basophils Absolute: 0.1 K/uL (ref 0.0–0.1)
Basophils Relative: 1 %
Eosinophils Absolute: 0.8 K/uL — ABNORMAL HIGH (ref 0.0–0.5)
Eosinophils Relative: 9 %
HCT: 38.9 % — ABNORMAL LOW (ref 39.0–52.0)
Hemoglobin: 13.3 g/dL (ref 13.0–17.0)
Immature Granulocytes: 3 %
Lymphocytes Relative: 19 %
Lymphs Abs: 1.8 K/uL (ref 0.7–4.0)
MCH: 33.7 pg (ref 26.0–34.0)
MCHC: 34.2 g/dL (ref 30.0–36.0)
MCV: 98.5 fL (ref 80.0–100.0)
Monocytes Absolute: 1 K/uL (ref 0.1–1.0)
Monocytes Relative: 10 %
Neutro Abs: 5.4 K/uL (ref 1.7–7.7)
Neutrophils Relative %: 58 %
Platelets: 184 K/uL (ref 150–400)
RBC: 3.95 MIL/uL — ABNORMAL LOW (ref 4.22–5.81)
RDW: 12.5 % (ref 11.5–15.5)
WBC: 9.5 K/uL (ref 4.0–10.5)
nRBC: 0 % (ref 0.0–0.2)

## 2023-12-17 MED ORDER — LINEZOLID 600 MG PO TABS
600.0000 mg | ORAL_TABLET | Freq: Two times a day (BID) | ORAL | 0 refills | Status: AC
  Filled 2023-12-17: qty 8, 4d supply, fill #0

## 2023-12-17 MED ORDER — AMOXICILLIN-POT CLAVULANATE 875-125 MG PO TABS
1.0000 | ORAL_TABLET | Freq: Two times a day (BID) | ORAL | 0 refills | Status: AC
Start: 2023-12-17 — End: 2023-12-21
  Filled 2023-12-17: qty 8, 4d supply, fill #0

## 2023-12-17 NOTE — Discharge Summary (Signed)
 Physician Discharge Summary  Ryan Holland FMW:[id number] DOB: Apr 25, 1970 DOA: 12/14/2023  PCP: Dorina Loving, [name]  Admit date: 12/14/2023 Discharge date: 12/17/2023 30 Day Unplanned Readmission Risk Score    Flowsheet Row ED to Hosp-Admission (Current) from 12/14/2023 in [facility]TH FLOOR PROGRESSIVE CARE AND UROLOGY  30 Day Unplanned Readmission Risk Score (%) 11.51 Filed at 12/17/2023 0801    This score is the patient's risk of an unplanned readmission within 30 days of being discharged (0 -100%). The score is based on dignosis, age, lab data, medications, orders, and past utilization.   Low:  0-14.9   Medium: 15-21.9   High: 22-29.9   Extreme: 30 and above          Admitted From: Home Disposition: Home  Recommendations for Outpatient Follow-up:  Follow up with PCP in 1-2 weeks Please obtain BMP/CBC in one week Please follow up with your PCP on the following pending results: Unresulted Labs (From admission, onward)    None         Home Health: None Equipment/Devices: None  Discharge Condition: Stable CODE STATUS: Full code Diet recommendation:  Diet Order             Diet Heart Room service appropriate? Yes; Fluid consistency: Thin  Diet effective now                   Subjective: Patient seen and examined, he has no complaints.  Feeling much better and eager to go home.  I also discussed with his wife about plan of care and answered several questions over the speaker phone in his room.  Brief/Interim Summary: Ryan Holland is a 53 y.o. male with medical history significant of hypertension, hyperlipidemia, anxiety, GERD, OSA on CPAP, class III obesity, asthma, alcohol  use disorder, hepatic steatosis, alcoholic hepatitis, splenomegaly, thrombocytopenia, hyponatremia presented with a chief complaint of shortness of breath.  Patient states he was admitted to the hospital for pneumonia back in July and was discharged on a steroid taper.  His symptoms  initially improved but then he started having shortness of breath again so he was seen by pulmonology in early September and started on a 6-week prednisone  taper which he is about to finish this week but for the past few days he is having increasing shortness of breath and hypoxia.  His pulse ox was as low as 70s at home.  When he left the hospital in July, he was sent home on supplemental oxygen which he did not require until a few days ago.  He is using 2 L nasal cannula.  Also having low-grade fevers and chills for the past few days.  No cough or chest pain.   ED Course: Febrile and tachycardic on arrival.  Labs notable for WBC count 11.6, platelet count 126k, blood cultures in process, troponin negative, COVID/influenza/RSV PCR negative, proBNP 320, D-dimer 0.65.  CTA chest negative for PE but showing findings concerning for pulmonary edema versus atypical pneumonia versus acute hypersensitivity pneumonitis.  Also showing small bilateral pleural effusions.  EKG showing sinus tachycardia and no acute ischemic changes.  Patient was given Tylenol , DuoNeb, ceftriaxone , and azithromycin .  Details of hospitalization below.   Acute on chronic hypoxemic respiratory failure and severe sepsis secondary to healthcare associated pneumonia/gram-positive bacteremia, POA, Meets criteria for healthcare associated pneumonia due to being hospitalized for 6 days with the last 90 days.  Patient was discharged from the hospital on 09/29/2023 with 2 L of oxygen however he was not using that until  few days ago.  D-dimer positive but CTA chest negative for PE but showing findings concerning for pulmonary edema versus atypical pneumonia versus acute hypersensitivity pneumonitis.  proBNP 320.  Echo done in July shows normal ejection fraction and no diastolic dysfunction.  Tested negative for COVID, RSV, flu and respiratory viral panel.  Given 1 dose of IV Lasix  20 mg in the ED.  one of two blood cx bottles collected on 12/14/23 at  0731 now has GPC (BCID= staph epi, mecA/C+).  Patient is now afebrile, last temperature spike of 101.5 around 11 PM on 12/15/2023.  Patient did require 4 L of oxygen on day 1 of hospitalization but he has been on room air for 2 days now.  He was treated with Rocephin , Zithromax  and vancomycin.  To the fact that his blood culture was growing Staph epidermidis and only 1 set of blood culture was drawn, we suspect that this is likely contaminant.  Regardless, since this is recurrent hospitalization for similar complaint/pneumonia, we have decided to treat this for total of 7 days, patient has received 3 days of antibiotics, discharging on 4 more days of oral Augmentin  with Zyvox.   Mild thrombocytopenia In the setting of infection.  Resolved.   Asthma Stable, no wheezing.  He is on biweekly Dupixent  injections.  Continue Singulair , Breo, and albuterol  as needed.   Hyperlipidemia He is on biweekly Repatha  injections.   Anxiety Continue Celexa .  Patient states he does not take Klonopin .   GERD Continue Protonix .   OSA Continue CPAP at night.   History of alcohol  use No signs of withdrawal at this time.  Placed on CIWA monitoring.   Elevated LFTs/hyperbilirubinemia: Patient has been having elevated LFTs and hyperbilirubinemia since 2 to 3 months, AST is actually improving and ALT has normalized and bilirubin improving.  He underwent CT abdomen pelvis during previous hospitalization which showed normal liver.  Monitor closely.   Class III morbid obesity: BMI almost 40.  Weight loss and dietary modification  counseled.    Discharge plan was discussed with patient and/or family member and they verbalized understanding and agreed with it.  Discharge Diagnoses:  Principal Problem:   Acute respiratory failure with hypoxia (HCC) Active Problems:   OSA on CPAP   SIRS (systemic inflammatory response syndrome) (HCC)   Thrombocytopenia   Asthma, chronic    Discharge Instructions   Allergies  as of 12/17/2023       Reactions   Other    LEMON-sores in mouth        Medication List     STOP taking these medications    acamprosate  333 MG tablet Commonly known as: CAMPRAL        TAKE these medications    albuterol  108 (90 Base) MCG/ACT inhaler Commonly known as: ProAir  HFA Inhale 2 puffs into the lungs every 4 (four) hours as needed for wheezing or shortness of breath.   amoxicillin -clavulanate 875-125 MG tablet Commonly known as: AUGMENTIN  Take 1 tablet by mouth 2 (two) times daily for 4 days.   benzonatate  100 MG capsule Commonly known as: TESSALON  Take 1 capsule (100 mg total) by mouth 3 (three) times daily as needed for cough.   Breo Ellipta  200-25 MCG/ACT Aepb Generic drug: fluticasone  furoate-vilanterol Inhale 1 puff into the lungs daily.   cholecalciferol 25 MCG (1000 UNIT) tablet Commonly known as: VITAMIN D3 Take 1,000 Units by mouth as directed.   citalopram  40 MG tablet Commonly known as: CELEXA  Take 1 tablet (40 mg total) by  mouth daily.   clonazePAM  0.5 MG tablet Commonly known as: KLONOPIN  Take 1 tablet (0.5 mg total) by mouth at bedtime as needed anxiety and insomnia   diphenhydrAMINE 25 mg capsule Commonly known as: BENADRYL Take 50 mg by mouth in the morning.   Dupixent  300 MG/2ML Soaj Generic drug: Dupilumab  Inject 300 mg into the skin every 14 (fourteen) days.   fluticasone  50 MCG/ACT nasal spray Commonly known as: FLONASE  Place 2 sprays into both nostrils daily.   linezolid 600 MG tablet Commonly known as: ZYVOX Take 1 tablet (600 mg total) by mouth 2 (two) times daily for 4 days.   methocarbamol  500 MG tablet Commonly known as: ROBAXIN  Take 1 tablet by mouth every  8 hours as needed for muscle spams.   montelukast  10 MG tablet Commonly known as: SINGULAIR  Take 1 tablet (10 mg total) by mouth daily.   pantoprazole  40 MG tablet Commonly known as: PROTONIX  Take 1 tablet (40 mg total) by mouth daily.   predniSONE  10  MG tablet Commonly known as: DELTASONE  Take 6 tablets (60 mg total) by mouth daily with breakfast. And decrease by one tablet daily   predniSONE  10 MG tablet Commonly known as: DELTASONE  Take 4 tablets once daily for 1 week, then 3 tabs once daily for 1 week,  then 2 tabs once daily for 1 week, then 1 tab once daily for 1 week,  then 1/2 tab once daily for 1 week,  then 1/2 tab on Monday, Wednesday, & Friday for 1 week, and then stop (Total 6 week course.   Repatha  SureClick 140 MG/ML Soaj Generic drug: Evolocumab  Inject 140 mg into the skin every 14 (fourteen) days.        Follow-up Information     [place], [place], [name] Follow up in 1 week(s).   Specialties: Internal Medicine, Family Medicine Contact information: [address] [name] [phone]                Allergies  Allergen Reactions   Other     LEMON-sores in mouth    Consultations: None   Procedures/Studies: CT Angio Chest PE W and/or Wo Contrast Result Date: 12/14/2023 CLINICAL DATA:  Elevated D-dimer level. Shortness of breath and hypoxia. EXAM: CT ANGIOGRAPHY CHEST WITH CONTRAST TECHNIQUE: Multidetector CT imaging of the chest was performed using the standard protocol during bolus administration of intravenous contrast. Multiplanar CT image reconstructions and MIPs were obtained to evaluate the vascular anatomy. RADIATION DOSE REDUCTION: This exam was performed according to the departmental dose-optimization program which includes automated exposure control, adjustment of the mA and/or kV according to patient size and/or use of iterative reconstruction technique. CONTRAST:  75mL OMNIPAQUE  IOHEXOL  350 MG/ML SOLN COMPARISON:  09/23/2023 FINDINGS: Cardiovascular: No filling defect is identified in the pulmonary arterial tree to suggest pulmonary embolus. Prominent main pulmonary artery at 4.1 cm diameter, suspicious for pulmonary arterial hypertension. Mild atheromatous vascular  calcification of the aortic arch. Mild cardiomegaly. Mediastinum/Nodes: Mildly enlarged right lower paratracheal node 1.1 cm in short axis on image 56 series 6. Other upper normal sized mediastinal lymph nodes are observed. Mildly enlarged 1.1 cm in short axis right hilar lymph node, image 67 series 6. These are similar to the 09/23/2023 exam. Mild prominence of mediastinal adipose tissues. Lungs/Pleura: Continued bilateral patchy and confluent ground-glass opacities in the lungs, with increase in right upper lobe involvement but mildly decreased left lung and right lower lobe involvement possibilities including pulmonary edema, atypical pneumonia, or  acute hypersensitivity pneumonitis. Given the small bilateral pleural effusions, pulmonary edema is favored. Upper Abdomen: Mildly lobulated hepatic contour, cannot exclude cirrhosis. Musculoskeletal: Thoracic spondylosis. Review of the MIP images confirms the above findings. IMPRESSION: 1. No filling defect is identified in the pulmonary arterial tree to suggest pulmonary embolus. 2. Continued bilateral patchy and confluent ground-glass opacities in the lungs, with increase in right upper lobe involvement but mildly decreased left lung and right lower lobe involvement. Possibilities include pulmonary edema (favored), atypical pneumonia, or acute hypersensitivity pneumonitis. 3. Small bilateral pleural effusions. 4. Mild cardiomegaly. 5. Prominent main pulmonary artery at 4.1 cm diameter, suspicious for pulmonary arterial hypertension. 6. Mildly enlarged right lower paratracheal and right hilar lymph nodes, similar to the 09/23/2023 exam. 7. Mildly lobulated hepatic contour, cannot exclude cirrhosis. 8. Thoracic spondylosis. 9.  Aortic Atherosclerosis (ICD10-I70.0). Electronically Signed   By: Ryan Salvage M.D.   On: 12/14/2023 11:24   DG Chest Port 1 View Result Date: 12/14/2023 CLINICAL DATA:  Shortness of breath. EXAM: PORTABLE CHEST 1 VIEW COMPARISON:   October 29, 2023. FINDINGS: Stable cardiomediastinal silhouette. Left lung is clear. Right lower lobe airspace opacity is noted concerning for pneumonia. Bony thorax is unremarkable. IMPRESSION: Right lower lobe airspace opacity is noted concerning for pneumonia. Electronically Signed   By: Lynwood Landy Raddle M.D.   On: 12/14/2023 07:57     Discharge Exam: Vitals:   12/16/23 2307 12/17/23 0541  BP:  (!) 153/98  Pulse: 61 75  Resp: 18 18  Temp:  98 F (36.7 C)  SpO2: 94% 97%   Vitals:   12/16/23 1447 12/16/23 2156 12/16/23 2307 12/17/23 0541  BP: 125/85 (!) 149/90  (!) 153/98  Pulse: 89 93 61 75  Resp: 14 19 18 18   Temp: 98.9 F (37.2 C) 98.7 F (37.1 C)  98 F (36.7 C)  TempSrc: Oral Oral  Oral  SpO2: 95% 95% 94% 97%  Weight:      Height:        General: Pt is alert, awake, not in acute distress Cardiovascular: RRR, S1/S2 +, no rubs, no gallops Respiratory: CTA bilaterally, no wheezing, no rhonchi Abdominal: Soft, NT, ND, bowel sounds + Extremities: no edema, no cyanosis    The results of significant diagnostics from this hospitalization (including imaging, microbiology, ancillary and laboratory) are listed below for reference.     Microbiology: Recent Results (from the past 240 hours)  Blood culture (routine x 2)     Status: Abnormal (Preliminary result)   Collection Time: 12/14/23  7:31 AM   Specimen: BLOOD  Result Value Ref Range Status   Specimen Description   Final    BLOOD RIGHT ANTECUBITAL Performed at [facility], [address] [place], [name]    Special Requests   Final    BOTTLES DRAWN AEROBIC AND ANAEROBIC Blood Culture adequate volume Performed at [facility], [address] [place], [name]    Culture  Setup Time   Final    GRAM POSITIVE COCCI ANAEROBIC BOTTLE ONLY CRITICAL RESULT CALLED TO, READ BACK BY AND VERIFIED WITH: PHARMD ANH PHAM [id number] AT 1014 BY EC    Culture (A)  Final    STAPHYLOCOCCUS  EPIDERMIDIS THE SIGNIFICANCE OF ISOLATING THIS ORGANISM FROM A SINGLE VENIPUNCTURE CANNOT BE PREDICTED WITHOUT FURTHER CLINICAL AND CULTURE CORRELATION. SUSCEPTIBILITIES AVAILABLE ONLY ON REQUEST. CULTURE REINCUBATED FOR BETTER GROWTH Performed at [facility], [address] [place], [place] [address]  Report Status PENDING  Incomplete  Blood Culture ID Panel (Reflexed)     Status: Abnormal   Collection Time: 12/14/23  7:31 AM  Result Value Ref Range Status   Enterococcus faecalis NOT DETECTED NOT DETECTED Final   Enterococcus Faecium NOT DETECTED NOT DETECTED Final   Listeria monocytogenes NOT DETECTED NOT DETECTED Final   Staphylococcus species DETECTED (A) NOT DETECTED Final    Comment: CRITICAL RESULT CALLED TO, READ BACK BY AND VERIFIED WITH: PHARMD ANH PHAM [id number] AT 1015 BY EC    Staphylococcus aureus (BCID) NOT DETECTED NOT DETECTED Final   Staphylococcus epidermidis DETECTED (A) NOT DETECTED Final    Comment: Methicillin (oxacillin) resistant coagulase negative staphylococcus. Possible blood culture contaminant (unless isolated from more than one blood culture draw or clinical case suggests pathogenicity). No antibiotic treatment is indicated for blood  culture contaminants. CRITICAL RESULT CALLED TO, READ BACK BY AND VERIFIED WITH: PHARMD ANH PHAM [id number] AT 1014 BY EC    Staphylococcus lugdunensis NOT DETECTED NOT DETECTED Final   Streptococcus species NOT DETECTED NOT DETECTED Final   Streptococcus agalactiae NOT DETECTED NOT DETECTED Final   Streptococcus pneumoniae NOT DETECTED NOT DETECTED Final   Streptococcus pyogenes NOT DETECTED NOT DETECTED Final   A.calcoaceticus-baumannii NOT DETECTED NOT DETECTED Final   Bacteroides fragilis NOT DETECTED NOT DETECTED Final   Enterobacterales NOT DETECTED NOT DETECTED Final   Enterobacter cloacae complex NOT DETECTED NOT DETECTED Final   Escherichia coli NOT DETECTED NOT DETECTED Final   Klebsiella aerogenes NOT  DETECTED NOT DETECTED Final   Klebsiella oxytoca NOT DETECTED NOT DETECTED Final   Klebsiella pneumoniae NOT DETECTED NOT DETECTED Final   Proteus species NOT DETECTED NOT DETECTED Final   Salmonella species NOT DETECTED NOT DETECTED Final   Serratia marcescens NOT DETECTED NOT DETECTED Final   Haemophilus influenzae NOT DETECTED NOT DETECTED Final   Neisseria meningitidis NOT DETECTED NOT DETECTED Final   Pseudomonas aeruginosa NOT DETECTED NOT DETECTED Final   Stenotrophomonas maltophilia NOT DETECTED NOT DETECTED Final   Candida albicans NOT DETECTED NOT DETECTED Final   Candida auris NOT DETECTED NOT DETECTED Final   Candida glabrata NOT DETECTED NOT DETECTED Final   Candida krusei NOT DETECTED NOT DETECTED Final   Candida parapsilosis NOT DETECTED NOT DETECTED Final   Candida tropicalis NOT DETECTED NOT DETECTED Final   Cryptococcus neoformans/gattii NOT DETECTED NOT DETECTED Final   Methicillin resistance mecA/C DETECTED (A) NOT DETECTED Final    Comment: CRITICAL RESULT CALLED TO, READ BACK BY AND VERIFIED WITH: PHARMD ANH PHAM [phone] AT 1014 BY EC Performed at [facility], [address], [place], [place] [address]   Resp panel by RT-PCR (RSV, Flu A&B, Covid) Anterior Nasal Swab     Status: None   Collection Time: 12/14/23  7:35 AM   Specimen: Anterior Nasal Swab  Result Value Ref Range Status   SARS Coronavirus 2 by RT PCR NEGATIVE NEGATIVE Final    Comment: (NOTE) SARS-CoV-2 target nucleic acids are NOT DETECTED.  The SARS-CoV-2 RNA is generally detectable in upper respiratory specimens during the acute phase of infection. The lowest concentration of SARS-CoV-2 viral copies this assay can detect is 138 copies/mL. A negative result does not preclude SARS-Cov-2 infection and should not be used as the sole basis for treatment or other patient management decisions. A negative result may occur with  improper specimen collection/handling, submission of specimen  other than nasopharyngeal swab, presence of viral mutation(s) within the areas targeted by this assay,  and inadequate number of viral copies(<138 copies/mL). A negative result must be combined with clinical observations, patient history, and epidemiological information. The expected result is Negative.  Fact Sheet for Patients:  BloggerCourse.com  Fact Sheet for Healthcare Providers:  SeriousBroker.it  This test is no t yet approved or cleared by the [place]  FDA and  has been authorized for detection and/or diagnosis of SARS-CoV-2 by FDA under an Emergency Use Authorization (EUA). This EUA will remain  in effect (meaning this test can be used) for the duration of the COVID-19 declaration under Section 564(b)(1) of the Act, 21 U.S.C.section 360bbb-3(b)(1), unless the authorization is terminated  or revoked sooner.       Influenza A by PCR NEGATIVE NEGATIVE Final   Influenza B by PCR NEGATIVE NEGATIVE Final    Comment: (NOTE) The Xpert Xpress SARS-CoV-2/FLU/RSV plus assay is intended as an aid in the diagnosis of influenza from Nasopharyngeal swab specimens and should not be used as a sole basis for treatment. Nasal washings and aspirates are unacceptable for Xpert Xpress SARS-CoV-2/FLU/RSV testing.  Fact Sheet for Patients: BloggerCourse.com  Fact Sheet for Healthcare Providers: SeriousBroker.it  This test is not yet approved or cleared by the [place]  FDA and has been authorized for detection and/or diagnosis of SARS-CoV-2 by FDA under an Emergency Use Authorization (EUA). This EUA will remain in effect (meaning this test can be used) for the duration of the COVID-19 declaration under Section 564(b)(1) of the Act, 21 U.S.C. section 360bbb-3(b)(1), unless the authorization is terminated or revoked.     Resp Syncytial Virus by PCR NEGATIVE NEGATIVE Final     Comment: (NOTE) Fact Sheet for Patients: BloggerCourse.com  Fact Sheet for Healthcare Providers: SeriousBroker.it  This test is not yet approved or cleared by the [place]  FDA and has been authorized for detection and/or diagnosis of SARS-CoV-2 by FDA under an Emergency Use Authorization (EUA). This EUA will remain in effect (meaning this test can be used) for the duration of the COVID-19 declaration under Section 564(b)(1) of the Act, 21 U.S.C. section 360bbb-3(b)(1), unless the authorization is terminated or revoked.  Performed at [facility], [address] [place], [name]   Blood culture (routine x 2)     Status: None (Preliminary result)   Collection Time: 12/14/23 11:59 PM   Specimen: BLOOD LEFT ARM  Result Value Ref Range Status   Specimen Description   Final    BLOOD LEFT ARM Performed at [facility], [address] [place], [place] [address]    Special Requests   Final    BOTTLES DRAWN AEROBIC AND ANAEROBIC Blood Culture adequate volume Performed at [facility], [address] [place], [place] [address]    Culture   Final    NO GROWTH 2 DAYS Performed at [facility], [address] [place], [place] [address]    Report Status PENDING  Incomplete  Respiratory (~20 pathogens) panel by PCR     Status: None   Collection Time: 12/15/23  2:49 AM   Specimen: Nasopharyngeal Swab; Respiratory  Result Value Ref Range Status   Adenovirus NOT DETECTED NOT DETECTED Final   Coronavirus 229E NOT DETECTED NOT DETECTED Final    Comment: (NOTE) The Coronavirus on the Respiratory Panel, DOES NOT test for the novel  Coronavirus (2019 nCoV)    Coronavirus HKU1 NOT DETECTED NOT DETECTED Final   Coronavirus NL63 NOT DETECTED NOT DETECTED Final   Coronavirus OC43 NOT DETECTED  NOT DETECTED Final   Metapneumovirus NOT DETECTED NOT DETECTED Final   Rhinovirus / Enterovirus  NOT DETECTED NOT DETECTED Final   Influenza A NOT DETECTED NOT DETECTED Final   Influenza B NOT DETECTED NOT DETECTED Final   Parainfluenza Virus 1 NOT DETECTED NOT DETECTED Final   Parainfluenza Virus 2 NOT DETECTED NOT DETECTED Final   Parainfluenza Virus 3 NOT DETECTED NOT DETECTED Final   Parainfluenza Virus 4 NOT DETECTED NOT DETECTED Final   Respiratory Syncytial Virus NOT DETECTED NOT DETECTED Final   Bordetella pertussis NOT DETECTED NOT DETECTED Final   Bordetella Parapertussis NOT DETECTED NOT DETECTED Final   Chlamydophila pneumoniae NOT DETECTED NOT DETECTED Final   Mycoplasma pneumoniae NOT DETECTED NOT DETECTED Final    Comment: Performed at [facility], [address], [place], [place] [address]     Labs: BNP (last 3 results) Recent Labs    09/23/23 2005 09/27/23 0443  BNP 74.0 578.0*   Basic Metabolic Panel: Recent Labs  Lab 12/14/23 0735 12/15/23 0209 12/16/23 0910 12/17/23 0457  NA 138 135 136 136  K 4.1 3.7 3.8 4.0  CL 104 104 102 103  CO2 22 22 22 23   GLUCOSE 144* 109* 112* 115*  BUN 14 13 13 14   CREATININE 0.80 0.82 0.82 0.82  CALCIUM  9.2 8.9 9.9 9.5   Liver Function Tests: Recent Labs  Lab 12/15/23 0209 12/17/23 0457  AST 47* 40  ALT 33 31  ALKPHOS 171* 201*  BILITOT 2.3* 1.5*  PROT 6.2* 6.6  ALBUMIN 3.3* 3.4*   No results for input(s): LIPASE, AMYLASE in the last 168 hours. No results for input(s): AMMONIA in the last 168 hours. CBC: Recent Labs  Lab 12/14/23 0735 12/15/23 0209 12/16/23 0910 12/17/23 0457  WBC 11.6* 10.4 10.6* 9.5  NEUTROABS  --   --  7.0 5.4  HGB 13.8 12.8* 14.3 13.3  HCT 39.0 38.8* 41.4 38.9*  MCV 96.3 100.8* 97.6 98.5  PLT 126* 155 187 184   Cardiac Enzymes: No results for input(s): CKTOTAL, CKMB, CKMBINDEX, TROPONINI in the last 168 hours. BNP: Invalid input(s): POCBNP CBG: No results for input(s): GLUCAP in the last 168 hours. D-Dimer No results for input(s): DDIMER in the  last 72 hours. Hgb A1c No results for input(s): HGBA1C in the last 72 hours. Lipid Profile No results for input(s): CHOL, HDL, LDLCALC, TRIG, CHOLHDL, LDLDIRECT in the last 72 hours. Thyroid  function studies No results for input(s): TSH, T4TOTAL, T3FREE, THYROIDAB in the last 72 hours.  Invalid input(s): FREET3 Anemia work up No results for input(s): VITAMINB12, FOLATE, FERRITIN, TIBC, IRON, RETICCTPCT in the last 72 hours. Urinalysis    Component Value Date/Time   COLORURINE AMBER (A) 09/24/2023 0300   APPEARANCEUR CLEAR 09/24/2023 0300   LABSPEC >1.046 (H) 09/24/2023 0300   PHURINE 6.0 09/24/2023 0300   GLUCOSEU NEGATIVE 09/24/2023 0300   HGBUR NEGATIVE 09/24/2023 0300   BILIRUBINUR SMALL (A) 09/24/2023 0300   BILIRUBINUR negative 03/17/2021 1015   KETONESUR NEGATIVE 09/24/2023 0300   PROTEINUR NEGATIVE 09/24/2023 0300   UROBILINOGEN 1.0 03/17/2021 1015   UROBILINOGEN 0.2 07/24/2011 1033   NITRITE NEGATIVE 09/24/2023 0300   LEUKOCYTESUR NEGATIVE 09/24/2023 0300   Sepsis Labs Recent Labs  Lab 12/14/23 0735 12/15/23 0209 12/16/23 0910 12/17/23 0457  WBC 11.6* 10.4 10.6* 9.5   Microbiology Recent Results (from the past 240 hours)  Blood culture (routine x 2)     Status: Abnormal (Preliminary result)   Collection Time: 12/14/23  7:31 AM   Specimen: BLOOD  Result Value Ref Range Status   Specimen Description   Final    BLOOD RIGHT ANTECUBITAL Performed at [facility], [address] [place], [name]    Special Requests   Final    BOTTLES DRAWN AEROBIC AND ANAEROBIC Blood Culture adequate volume Performed at [facility], [address] [place], [name]    Culture  Setup Time   Final    GRAM POSITIVE COCCI ANAEROBIC BOTTLE ONLY CRITICAL RESULT CALLED TO, READ BACK BY AND VERIFIED WITH: PHARMD ANH PHAM [id number] AT 1014 BY EC    Culture (A)  Final    STAPHYLOCOCCUS EPIDERMIDIS THE  SIGNIFICANCE OF ISOLATING THIS ORGANISM FROM A SINGLE VENIPUNCTURE CANNOT BE PREDICTED WITHOUT FURTHER CLINICAL AND CULTURE CORRELATION. SUSCEPTIBILITIES AVAILABLE ONLY ON REQUEST. CULTURE REINCUBATED FOR BETTER GROWTH Performed at [facility], [address] [place], [place] [address]    Report Status PENDING  Incomplete  Blood Culture ID Panel (Reflexed)     Status: Abnormal   Collection Time: 12/14/23  7:31 AM  Result Value Ref Range Status   Enterococcus faecalis NOT DETECTED NOT DETECTED Final   Enterococcus Faecium NOT DETECTED NOT DETECTED Final   Listeria monocytogenes NOT DETECTED NOT DETECTED Final   Staphylococcus species DETECTED (A) NOT DETECTED Final    Comment: CRITICAL RESULT CALLED TO, READ BACK BY AND VERIFIED WITH: PHARMD ANH PHAM [id number] AT 1015 BY EC    Staphylococcus aureus (BCID) NOT DETECTED NOT DETECTED Final   Staphylococcus epidermidis DETECTED (A) NOT DETECTED Final    Comment: Methicillin (oxacillin) resistant coagulase negative staphylococcus. Possible blood culture contaminant (unless isolated from more than one blood culture draw or clinical case suggests pathogenicity). No antibiotic treatment is indicated for blood  culture contaminants. CRITICAL RESULT CALLED TO, READ BACK BY AND VERIFIED WITH: PHARMD ANH PHAM [id number] AT 1014 BY EC    Staphylococcus lugdunensis NOT DETECTED NOT DETECTED Final   Streptococcus species NOT DETECTED NOT DETECTED Final   Streptococcus agalactiae NOT DETECTED NOT DETECTED Final   Streptococcus pneumoniae NOT DETECTED NOT DETECTED Final   Streptococcus pyogenes NOT DETECTED NOT DETECTED Final   A.calcoaceticus-baumannii NOT DETECTED NOT DETECTED Final   Bacteroides fragilis NOT DETECTED NOT DETECTED Final   Enterobacterales NOT DETECTED NOT DETECTED Final   Enterobacter cloacae complex NOT DETECTED NOT DETECTED Final   Escherichia coli NOT DETECTED NOT DETECTED Final   Klebsiella aerogenes NOT DETECTED NOT  DETECTED Final   Klebsiella oxytoca NOT DETECTED NOT DETECTED Final   Klebsiella pneumoniae NOT DETECTED NOT DETECTED Final   Proteus species NOT DETECTED NOT DETECTED Final   Salmonella species NOT DETECTED NOT DETECTED Final   Serratia marcescens NOT DETECTED NOT DETECTED Final   Haemophilus influenzae NOT DETECTED NOT DETECTED Final   Neisseria meningitidis NOT DETECTED NOT DETECTED Final   Pseudomonas aeruginosa NOT DETECTED NOT DETECTED Final   Stenotrophomonas maltophilia NOT DETECTED NOT DETECTED Final   Candida albicans NOT DETECTED NOT DETECTED Final   Candida auris NOT DETECTED NOT DETECTED Final   Candida glabrata NOT DETECTED NOT DETECTED Final   Candida krusei NOT DETECTED NOT DETECTED Final   Candida parapsilosis NOT DETECTED NOT DETECTED Final   Candida tropicalis NOT DETECTED NOT DETECTED Final   Cryptococcus neoformans/gattii NOT DETECTED NOT DETECTED Final   Methicillin resistance mecA/C DETECTED (A) NOT DETECTED Final    Comment: CRITICAL RESULT CALLED TO, READ BACK BY AND VERIFIED  WITH: PHARMD ANH PHAM [id number] AT 1014 BY EC Performed at [facility], [address], [place], [place] [address]   Resp panel by RT-PCR (RSV, Flu A&B, Covid) Anterior Nasal Swab     Status: None   Collection Time: 12/14/23  7:35 AM   Specimen: Anterior Nasal Swab  Result Value Ref Range Status   SARS Coronavirus 2 by RT PCR NEGATIVE NEGATIVE Final    Comment: (NOTE) SARS-CoV-2 target nucleic acids are NOT DETECTED.  The SARS-CoV-2 RNA is generally detectable in upper respiratory specimens during the acute phase of infection. The lowest concentration of SARS-CoV-2 viral copies this assay can detect is 138 copies/mL. A negative result does not preclude SARS-Cov-2 infection and should not be used as the sole basis for treatment or other patient management decisions. A negative result may occur with  improper specimen collection/handling, submission of specimen other than  nasopharyngeal swab, presence of viral mutation(s) within the areas targeted by this assay, and inadequate number of viral copies(<138 copies/mL). A negative result must be combined with clinical observations, patient history, and epidemiological information. The expected result is Negative.  Fact Sheet for Patients:  BloggerCourse.com  Fact Sheet for Healthcare Providers:  SeriousBroker.it  This test is no t yet approved or cleared by the [place]  FDA and  has been authorized for detection and/or diagnosis of SARS-CoV-2 by FDA under an Emergency Use Authorization (EUA). This EUA will remain  in effect (meaning this test can be used) for the duration of the COVID-19 declaration under Section 564(b)(1) of the Act, 21 U.S.C.section 360bbb-3(b)(1), unless the authorization is terminated  or revoked sooner.       Influenza A by PCR NEGATIVE NEGATIVE Final   Influenza B by PCR NEGATIVE NEGATIVE Final    Comment: (NOTE) The Xpert Xpress SARS-CoV-2/FLU/RSV plus assay is intended as an aid in the diagnosis of influenza from Nasopharyngeal swab specimens and should not be used as a sole basis for treatment. Nasal washings and aspirates are unacceptable for Xpert Xpress SARS-CoV-2/FLU/RSV testing.  Fact Sheet for Patients: BloggerCourse.com  Fact Sheet for Healthcare Providers: SeriousBroker.it  This test is not yet approved or cleared by the [place]  FDA and has been authorized for detection and/or diagnosis of SARS-CoV-2 by FDA under an Emergency Use Authorization (EUA). This EUA will remain in effect (meaning this test can be used) for the duration of the COVID-19 declaration under Section 564(b)(1) of the Act, 21 U.S.C. section 360bbb-3(b)(1), unless the authorization is terminated or revoked.     Resp Syncytial Virus by PCR NEGATIVE NEGATIVE Final    Comment:  (NOTE) Fact Sheet for Patients: BloggerCourse.com  Fact Sheet for Healthcare Providers: SeriousBroker.it  This test is not yet approved or cleared by the [place]  FDA and has been authorized for detection and/or diagnosis of SARS-CoV-2 by FDA under an Emergency Use Authorization (EUA). This EUA will remain in effect (meaning this test can be used) for the duration of the COVID-19 declaration under Section 564(b)(1) of the Act, 21 U.S.C. section 360bbb-3(b)(1), unless the authorization is terminated or revoked.  Performed at [facility], [address] [place], [name]   Blood culture (routine x 2)     Status: None (Preliminary result)   Collection Time: 12/14/23 11:59 PM   Specimen: BLOOD LEFT ARM  Result Value Ref Range Status   Specimen Description   Final    BLOOD LEFT ARM Performed at [facility], [address] [place], [place] [address]    Special Requests   Final    BOTTLES DRAWN AEROBIC AND ANAEROBIC Blood Culture adequate volume Performed at [facility], [address], [place], [place] [address]    Culture   Final    NO GROWTH 2 DAYS Performed at [facility], [address] [place], [place] [address]    Report Status PENDING  Incomplete  Respiratory (~20 pathogens) panel by PCR     Status: None   Collection Time: 12/15/23  2:49 AM   Specimen: Nasopharyngeal Swab; Respiratory  Result Value Ref Range Status   Adenovirus NOT DETECTED NOT DETECTED Final   Coronavirus 229E NOT DETECTED NOT DETECTED Final    Comment: (NOTE) The Coronavirus on the Respiratory Panel, DOES NOT test for the novel  Coronavirus (2019 nCoV)    Coronavirus HKU1 NOT DETECTED NOT DETECTED Final   Coronavirus NL63 NOT DETECTED NOT DETECTED Final   Coronavirus OC43 NOT DETECTED NOT DETECTED Final   Metapneumovirus NOT DETECTED NOT DETECTED Final   Rhinovirus / Enterovirus NOT  DETECTED NOT DETECTED Final   Influenza A NOT DETECTED NOT DETECTED Final   Influenza B NOT DETECTED NOT DETECTED Final   Parainfluenza Virus 1 NOT DETECTED NOT DETECTED Final   Parainfluenza Virus 2 NOT DETECTED NOT DETECTED Final   Parainfluenza Virus 3 NOT DETECTED NOT DETECTED Final   Parainfluenza Virus 4 NOT DETECTED NOT DETECTED Final   Respiratory Syncytial Virus NOT DETECTED NOT DETECTED Final   Bordetella pertussis NOT DETECTED NOT DETECTED Final   Bordetella Parapertussis NOT DETECTED NOT DETECTED Final   Chlamydophila pneumoniae NOT DETECTED NOT DETECTED Final   Mycoplasma pneumoniae NOT DETECTED NOT DETECTED Final    Comment: Performed at [facility], [address], [place], [place] [address]    FURTHER DISCHARGE INSTRUCTIONS:   Get Medicines reviewed and adjusted: Please take all your medications with you for your next visit with your Primary MD   Laboratory/radiological data: Please request your Primary MD to go over all hospital tests and procedure/radiological results at the follow up, please ask your Primary MD to get all Hospital records sent to his/her office.   In some cases, they will be blood work, cultures and biopsy results pending at the time of your discharge. Please request that your primary care M.D. goes through all the records of your hospital data and follows up on these results.   Also Note the following: If you experience worsening of your admission symptoms, develop shortness of breath, life threatening emergency, suicidal or homicidal thoughts you must seek medical attention immediately by calling 911 or calling your MD immediately  if symptoms less severe.   You must read complete instructions/literature along with all the possible adverse reactions/side effects for all the Medicines you take and that have been prescribed to you. Take any new Medicines after you have completely understood and accpet all the possible adverse reactions/side  effects.    patient was instructed, not to drive, operate heavy machinery, perform activities at heights, swimming or participation in water activities or provide baby-sitting services while on Pain, Sleep and Anxiety Medications; until their outpatient Physician has advised to do so again. Also recommended to not to take more than prescribed Pain, Sleep and Anxiety Medications.  It is not advisable to combine anxiety, sleep and pain medications without talking with your primary care provider.     Wear Seat belts while driving.   Please note: You were cared  for by a hospitalist during your hospital stay. Once you are discharged, your primary care physician will handle any further medical issues. Please note that NO REFILLS for any discharge medications will be authorized once you are discharged, as it is imperative that you return to your primary care physician (or establish a relationship with a primary care physician if you do not have one) for your post hospital discharge needs so that they can reassess your need for medications and monitor your lab values  Time coordinating discharge: Over 30 minutes  SIGNED:   Fredia Skeeter, MD  [facility] 12/17/2023, 10:27 AM *Please note that this is a verbal dictation therefore any spelling or grammatical errors are due to the Dragon Medical One system interpretation. If 7PM-7AM, please contact night-coverage [email or web address]

## 2023-12-17 NOTE — TOC Transition Note (Signed)
 Transition of Care [facility]) - Discharge Note   Patient Details  Name: Ryan Holland MRN: [id number] Date of Birth: [birth year]  [facility] CM/SW Contact:  Bascom Service, RN Phone Number: 12/17/2023, 10:42 AM   Clinical Narrative:  d/c home no CM needs.     Final next level of care: Home/Self Care Barriers to Discharge: No Barriers Identified   Patient Goals and CMS Choice   CMS Medicare.gov Compare Post Acute Care list provided to:: Patient Choice offered to / list presented to : Patient [place] ownership interest in [facility].provided to:: Patient    Discharge Placement                       Discharge Plan and Services Additional resources added to the After Visit Summary for                                       Social Drivers of Health (SDOH) Interventions SDOH Screenings   Food Insecurity: No Food Insecurity (12/15/2023)  Housing: Low Risk  (12/15/2023)  Transportation Needs: No Transportation Needs (12/15/2023)  Utilities: Not At Risk (12/15/2023)  Depression (PHQ2-9): Low Risk  (10/09/2023)  Social Connections: Moderately Isolated (09/24/2023)  Tobacco Use: Medium Risk (12/14/2023)     Readmission Risk Interventions    09/24/2023   11:58 AM  Readmission Risk Prevention Plan  Post Dischage Appt Complete  Medication Screening Complete  Transportation Screening Complete

## 2023-12-17 NOTE — Progress Notes (Signed)
 Discharge orders placed. AVS reviewed, PIV removed. All belongings accounted for including phone, wallet, laptop, chargers, glasses, clothes and shoes.   Patient wheeled to waiting area.

## 2023-12-18 ENCOUNTER — Ambulatory Visit: Payer: Self-pay | Admitting: Medical

## 2023-12-18 ENCOUNTER — Telehealth: Payer: Self-pay

## 2023-12-18 LAB — CULTURE, BLOOD (ROUTINE X 2): Special Requests: ADEQUATE

## 2023-12-18 NOTE — Transitions of Care (Post Inpatient/ED Visit) (Unsigned)
   12/18/2023  Name: Ryan Holland MRN: [id number] DOB: 06-21-1970  Today's TOC FU Call Status: Today's TOC FU Call Status:: Unsuccessful Call (1st Attempt) Unsuccessful Call (1st Attempt) Date: 12/18/23  Attempted to reach the patient regarding the most recent Inpatient/ED visit.  Follow Up Plan: Additional outreach attempts will be made to reach the patient to complete the Transitions of Care (Post Inpatient/ED visit) call.   Signature Julian Lemmings, LPN [facility] Nurse Health Advisor Direct Dial [phone]

## 2023-12-19 ENCOUNTER — Other Ambulatory Visit: Payer: Self-pay | Admitting: Medical

## 2023-12-19 ENCOUNTER — Other Ambulatory Visit: Payer: Self-pay | Admitting: Pharmacist

## 2023-12-19 ENCOUNTER — Other Ambulatory Visit: Payer: Self-pay

## 2023-12-19 ENCOUNTER — Other Ambulatory Visit (HOSPITAL_COMMUNITY): Payer: Self-pay

## 2023-12-19 DIAGNOSIS — F339 Major depressive disorder, recurrent, unspecified: Secondary | ICD-10-CM

## 2023-12-19 NOTE — Transitions of Care (Post Inpatient/ED Visit) (Unsigned)
   12/19/2023  Name: Brazen Domangue MRN: [id number] DOB: [birth year]  Today's TOC FU Call Status: Today's TOC FU Call Status:: Unsuccessful Call (2nd Attempt) Unsuccessful Call (1st Attempt) Date: 12/18/23 Unsuccessful Call (2nd Attempt) Date: 12/19/23  Attempted to reach the patient regarding the most recent Inpatient/ED visit.  Follow Up Plan: Additional outreach attempts will be made to reach the patient to complete the Transitions of Care (Post Inpatient/ED visit) call.   Signature Julian Lemmings, LPN [facility] Nurse Health Advisor Direct Dial [phone]

## 2023-12-20 ENCOUNTER — Encounter: Payer: Self-pay | Admitting: Pulmonary Disease

## 2023-12-20 ENCOUNTER — Ambulatory Visit: Admitting: Pulmonary Disease

## 2023-12-20 ENCOUNTER — Telehealth: Payer: Self-pay

## 2023-12-20 VITALS — BP 116/69 | HR 76 | Temp 98.3°F | Ht 70.0 in | Wt 278.0 lb

## 2023-12-20 DIAGNOSIS — G4733 Obstructive sleep apnea (adult) (pediatric): Secondary | ICD-10-CM | POA: Diagnosis not present

## 2023-12-20 DIAGNOSIS — J849 Interstitial pulmonary disease, unspecified: Secondary | ICD-10-CM | POA: Diagnosis not present

## 2023-12-20 LAB — CULTURE, BLOOD (ROUTINE X 2)
Culture: NO GROWTH
Special Requests: ADEQUATE

## 2023-12-20 NOTE — Transitions of Care (Post Inpatient/ED Visit) (Signed)
 12/20/2023  Name: Ryan Holland MRN: [id number] DOB: 22-Apr-1970  Today's TOC FU Call Status: Today's TOC FU Call Status:: Successful TOC FU Call Completed Unsuccessful Call (1st Attempt) Date: 12/18/23 Unsuccessful Call (2nd Attempt) Date: 12/19/23 [facility] FU Call Complete Date: 12/20/23 Patient's Name and Date of Birth confirmed.  Transition Care Management Follow-up Telephone Call Date of Discharge: 12/17/23 Discharge Facility: [facility]) Type of Discharge: Inpatient Admission Primary Inpatient Discharge Diagnosis:: pneumonia How have you been since you were released from the hospital?: Better Any questions or concerns?: No  Items Reviewed: Did you receive and understand the discharge instructions provided?: Yes Medications obtained,verified, and reconciled?: Yes (Medications Reviewed) Any new allergies since your discharge?: No Dietary orders reviewed?: Yes Do you have support at home?: Yes People in Home [RPT]: child(ren), adult, spouse  Medications Reviewed Today: Medications Reviewed Today     Reviewed by Emmitt Pan, LPN (Licensed Practical Nurse) on 12/20/23 at 1049  Med List Status: <None>   Medication Order Taking? Sig Documenting Provider Last Dose Status Informant  albuterol  (PROAIR  HFA) 108 (90 Base) MCG/ACT inhaler [id number] Yes Inhale 2 puffs into the lungs every 4 (four) hours as needed for wheezing or shortness of breath. Darlean Ozell NOVAK, MD  Active   amoxicillin -clavulanate (AUGMENTIN ) 875-125 MG tablet [id number] Yes Take 1 tablet by mouth 2 (two) times daily for 4 days. Vernon Ranks, MD  Active   benzonatate  (TESSALON ) 100 MG capsule [id number] Yes Take 1 capsule (100 mg total) by mouth 3 (three) times daily as needed for cough. Saguier, [place], [name]  Active   cholecalciferol (VITAMIN D3) 25 MCG (1000 UNIT) tablet [id number] Yes Take 1,000 Units by mouth as directed. [provider]  Active   citalopram  (CELEXA ) 40 MG tablet [id number] Yes  Take 1 tablet (40 mg total) by mouth daily. Pavero, Lonni, [facility]  Active   clonazePAM  (KLONOPIN ) 0.5 MG tablet [id number] Yes Take 1 tablet (0.5 mg total) by mouth at bedtime as needed anxiety and insomnia Saguier, [place], [name]  Active   diphenhydrAMINE (BENADRYL) 25 mg capsule [id number] Yes Take 50 mg by mouth in the morning. [provider]  Active Self, Pharmacy Records, Other, Multiple Informants  Dupilumab  (DUPIXENT ) 300 MG/2ML SOAJ [id number] Yes Inject 300 mg into the skin every 14 (fourteen) days. Jegede, Olugbemiga E, MD  Active Self, Pharmacy Records, Other, Multiple Informants  Evolocumab  (REPATHA  SURECLICK) 140 MG/ML SOAJ [id number] Yes Inject 140 mg into the skin every 14 (fourteen) days. Kate Lonni CROME, MD  Active Self, Pharmacy Records, Other, Multiple Informants  fluticasone  (FLONASE ) 50 MCG/ACT nasal spray [id number] Yes Place 2 sprays into both nostrils daily. Saguier, [place], [name]  Active Self, Pharmacy Records, Other, Multiple Informants  fluticasone  furoate-vilanterol (BREO ELLIPTA ) 200-25 MCG/ACT AEPB [id number] Yes Inhale 1 puff into the lungs daily. Geronimo Amel, MD  Active Self, Pharmacy Records, Other, Multiple Informants  linezolid (ZYVOX) 600 MG tablet [id number] Yes Take 1 tablet (600 mg total) by mouth 2 (two) times daily for 4 days. Vernon Ranks, MD  Active   methocarbamol  (ROBAXIN ) 500 MG tablet [id number] Yes Take 1 tablet by mouth every  8 hours as needed for muscle spams. Saguier, [place], [name]  Active   montelukast  (SINGULAIR ) 10 MG tablet [id number] Yes Take 1 tablet (10 mg total) by mouth daily. Saguier, [place], [name]  Active Self, Pharmacy Records, Other, Multiple Informants  pantoprazole  (PROTONIX ) 40 MG tablet [id number] Yes Take 1 tablet (40 mg total) by mouth daily. Saguier, Edward, [name]  Active Self, Pharmacy  Records, Other, Multiple Informants  predniSONE  (DELTASONE ) 10 MG tablet [id number] Yes Take 6 tablets (60 mg total) by mouth daily with  breakfast. And decrease by one tablet daily Tat, Alm, MD  Active   predniSONE  (DELTASONE ) 10 MG tablet [id number] Yes Take 4 tablets once daily for 1 week, then 3 tabs once daily for 1 week,  then 2 tabs once daily for 1 week, then 1 tab once daily for 1 week,  then 1/2 tab once daily for 1 week,  then 1/2 tab on Monday, Wednesday, & Friday for 1 week, and then stop (Total 6 week course. Geronimo Amel, MD  Active             Home Care and Equipment/Supplies: Were Home Health Services Ordered?: NA Any new equipment or medical supplies ordered?: NA  Functional Questionnaire: Do you need assistance with bathing/showering or dressing?: No Do you need assistance with meal preparation?: No Do you need assistance with eating?: No Do you have difficulty maintaining continence: No Do you need assistance with getting out of bed/getting out of a chair/moving?: No Do you have difficulty managing or taking your medications?: No  Follow up appointments reviewed: PCP Follow-up appointment confirmed?: Yes Date of PCP follow-up appointment?: 12/25/23 Follow-up Provider: [facility] Follow-up appointment confirmed?: Yes Date of Specialist follow-up appointment?: 12/20/23 Follow-Up Specialty Provider:: pulmo Do you need transportation to your follow-up appointment?: No Do you understand care options if your condition(s) worsen?: Yes-patient verbalized understanding    SIGNATURE Julian Lemmings, LPN [facility] Nurse Health Advisor Direct Dial [phone]

## 2023-12-20 NOTE — Progress Notes (Signed)
 High-resolution CT scan              Ryan Holland    [id number]    06/09/1970  Primary Care Physician:[place], [place] RIGGERS  Referring Physician: [name], [name] [id number] [place],  [name]  Chief complaint:   Patient being seen for shortness of breath  HPI:  Patient with sleep apnea, continues to use CPAP on a nightly basis Not having any problems with his CPAP  Following up with Dr. Geronimo for interstitial lung disease Had called in because he was recently treated with a course of antibiotics for possible pneumonia-was treated with a course of Rocephin , Zithromax , vancomycin. He was recently started on prednisone  by Dr. Geronimo a tapering course started on 11/06/2023 Concerned about CT scan findings -Discussed as ground glass changes which according to patient had been mentioned before  He has a 40-pack-year smoking history Worked in the [employer] but recently-mostly office-based Chronic cough  Abnormal CT scan was initially noted in 2024 describing ground glass changes Repeat CT scan did show some fleeting but persistence of infiltrate in the right lung He had a recent CT scan during his recent hospitalization that still showed ground glass changes  Multiple CTs was reviewed with the patient in the office today with some improvement in the ground glass changes at the left mid lung, left base but persistence in the right lung  Has a history of asthma, on Biologics, follows up with Dr. Geronimo  His weight has been stable he does still feel tired during the day  Does have a history of chronic cough  Continues to use his CPAP, not complaining about his CPAP at present  Outpatient Encounter Medications as of 12/20/2023  Medication Sig   albuterol  (PROAIR  HFA) 108 (90 Base) MCG/ACT inhaler Inhale 2 puffs into the lungs every 4 (four) hours as needed for wheezing or shortness of breath.   amoxicillin -clavulanate (AUGMENTIN ) 875-125 MG  tablet Take 1 tablet by mouth 2 (two) times daily for 4 days.   benzonatate  (TESSALON ) 100 MG capsule Take 1 capsule (100 mg total) by mouth 3 (three) times daily as needed for cough.   cholecalciferol (VITAMIN D3) 25 MCG (1000 UNIT) tablet Take 1,000 Units by mouth as directed.   citalopram  (CELEXA ) 40 MG tablet Take 1 tablet (40 mg total) by mouth daily.   diphenhydrAMINE (BENADRYL) 25 mg capsule Take 50 mg by mouth in the morning.   Dupilumab  (DUPIXENT ) 300 MG/2ML SOAJ Inject 300 mg into the skin every 14 (fourteen) days.   Evolocumab  (REPATHA  SURECLICK) 140 MG/ML SOAJ Inject 140 mg into the skin every 14 (fourteen) days.   fluticasone  (FLONASE ) 50 MCG/ACT nasal spray Place 2 sprays into both nostrils daily.   fluticasone  furoate-vilanterol (BREO ELLIPTA ) 200-25 MCG/ACT AEPB Inhale 1 puff into the lungs daily.   methocarbamol  (ROBAXIN ) 500 MG tablet Take 1 tablet by mouth every  8 hours as needed for muscle spams.   montelukast  (SINGULAIR ) 10 MG tablet Take 1 tablet (10 mg total) by mouth daily.   pantoprazole  (PROTONIX ) 40 MG tablet Take 1 tablet (40 mg total) by mouth daily.   clonazePAM  (KLONOPIN ) 0.5 MG tablet Take 1 tablet (0.5 mg total) by mouth at bedtime as needed anxiety and insomnia (Patient not taking: Reported on 12/20/2023)   linezolid (ZYVOX) 600 MG tablet Take 1 tablet (600 mg total) by mouth 2 (two) times daily for 4 days.   predniSONE  (DELTASONE ) 10 MG tablet Take 6 tablets (  60 mg total) by mouth daily with breakfast. And decrease by one tablet daily (Patient not taking: Reported on 12/20/2023)   predniSONE  (DELTASONE ) 10 MG tablet Take 4 tablets once daily for 1 week, then 3 tabs once daily for 1 week,  then 2 tabs once daily for 1 week, then 1 tab once daily for 1 week,  then 1/2 tab once daily for 1 week,  then 1/2 tab on Monday, Wednesday, & Friday for 1 week, and then stop (Total 6 week course. (Patient not taking: Reported on 12/20/2023)   No facility-administered encounter  medications on file as of 12/20/2023.    Allergies as of 12/20/2023 - Review Complete 12/20/2023  Allergen Reaction Noted   Other  07/17/2011    Past Medical History:  Diagnosis Date   Anxiety    GERD (gastroesophageal reflux disease)    OCCAS-WOULD USE OTC MED IF NEEDED   HNP (herniated nucleus pulposus), lumbar    PAIN IN LOWER BACK AND PAIN DOWN BOTH LEGS -12-24-14 radiates down right leg presently.   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 Corey   Sleep apnea    HAS CPAP-DOES NOT USE-DOES NOT KNOW SETTINGS    Past Surgical History:  Procedure Laterality Date   01/05/2011 LUMBAR DECOMPRESSION AND MICRODISCECTOMY     CERVICAL FUSION  C4-5      ESOPHAGOGASTRODUODENOSCOPY (EGD) WITH PROPOFOL  N/A 12/30/2014   Procedure: ESOPHAGOGASTRODUODENOSCOPY (EGD) WITH PROPOFOL ;  Surgeon: Elsie Cree, MD;  Location: [facility];  Service: Endoscopy;  Laterality: N/A;  With Bravo   LUMBAR LAMINECTOMY/DECOMPRESSION MICRODISCECTOMY  07/26/2011   Procedure: LUMBAR LAMINECTOMY/DECOMPRESSION MICRODISCECTOMY;  Surgeon: [name], MD;  Location: [facility];  Service: Orthopedics;  Laterality: Right;  Re-Do Lumbar Decompression L5-S1 on Right,    PILONIDAL CYST EXCISION      Family History  Problem Relation Age of Onset   COPD Father    Congestive Heart Failure Father    Breast cancer Mother     Social History   Socioeconomic History   Marital status: Married    Spouse name: Not on file   Number of children: Not on file   Years of education: Not on file   Highest education level: Not on file  Occupational History   Not on file  Tobacco Use   Smoking status: Former    Current packs/day: 0.00    Average packs/day: 2.0 packs/day for 20.0 years (40.0 ttl pk-yrs)    Types: Cigarettes    Start date: 10/05/1991    Quit date: 10/05/2011    Years since quitting: 12.2   Smokeless tobacco: Former    Types: Chew  Substance and  Sexual Activity   Alcohol  use: Yes    Alcohol /week: 0.0 standard drinks of alcohol     Comment: 2 MIXED DRINKS (VODKA) DAILY-usually    Drug use: No   Sexual activity: Not on file  Other Topics Concern   Not on file  Social History Narrative   Not on file   Social Drivers of Health   Financial Resource Strain: Not on file  Food Insecurity: No Food Insecurity (12/15/2023)   Hunger Vital Sign    Worried About Running Out of Food in the Last Year: Never true    Ran Out of Food in the Last Year: Never true  Transportation Needs: No Transportation Needs (12/15/2023)   PRAPARE - Administrator, Civil Service (Medical): No  Lack of Transportation (Non-Medical): No  Physical Activity: Not on file  Stress: Not on file  Social Connections: Moderately Isolated (09/24/2023)   Social Connection and Isolation Panel    Frequency of Communication with Friends and Family: Twice a week    Frequency of Social Gatherings with Friends and Family: Once a week    Attends Religious Services: Never    Database administrator or Organizations: No    Attends Banker Meetings: Never    Marital Status: Married  Catering manager Violence: Not At Risk (12/15/2023)   Humiliation, Afraid, Rape, and Kick questionnaire    Fear of Current or Ex-Partner: No    Emotionally Abused: No    Physically Abused: No    Sexually Abused: No    Review of Systems  Respiratory:  Positive for apnea and shortness of breath.   Psychiatric/Behavioral:  Positive for sleep disturbance.     Vitals:   12/20/23 1101  BP: 116/69  Pulse: 76  Temp: 98.3 F (36.8 C)  SpO2: 97%     Physical Exam Constitutional:      Appearance: He is obese.  HENT:     Head: Normocephalic.     Nose: Nose normal.     Mouth/Throat:     Mouth: Mucous membranes are moist.  Eyes:     General: No scleral icterus. Cardiovascular:     Rate and Rhythm: Normal rate and regular rhythm.     Heart sounds: No murmur  heard.    No friction rub.  Pulmonary:     Effort: No respiratory distress.     Breath sounds: No stridor. No wheezing or rhonchi.  Musculoskeletal:     Cervical back: No rigidity or tenderness.  Neurological:     Mental Status: He is alert.  Psychiatric:        Mood and Affect: Mood normal.       09/18/2023    9:00 AM  Results of the Epworth flowsheet  Sitting and reading 3  Watching TV 3  Sitting, inactive in a public place (e.g. a theatre or a meeting) 2  As a passenger in a car for an hour without a break 2  Lying down to rest in the afternoon when circumstances permit 2  Sitting and talking to someone 1  Sitting quietly after a lunch without alcohol  2  In a car, while stopped for a few minutes in traffic 2  Total score 17    Data Reviewed: Sleep studies not available to be reviewed  Most recent pulmonary function test in November 2024 significant for restriction  CT chest 12/14/2023, 09/23/2023, 09/21/2023, 12/12/2022 reviewed with the patient  Assessment:  History of cough variant asthma  Persistent abnormal CT scan of the chest with interstitial changes, ground glass changes with some concern with persistence in the right lung infiltrate  Recent hospitalization and treatment for pneumonia  Obstructive sleep apnea on CPAP therapy - Has been using oxygen supplementation, sometimes uses his oxygen tube under is CPAP mask - Was not prescribed oxygen therapy for his obstructive sleep apnea and nocturnal hypoxemia - We will follow-up on this  Plan/Recommendations: Reason for the visit today is concerned about CT scan findings  I believe is appropriate to repeat his CT in about 5 to 6 weeks to compare with most recent CT scan allowing for time for pneumonic infiltrate to improve  With the persistence of these findings, may need further evaluation and intervention which may include a biopsy of  his lungs to ascertain his diagnosis - He is already following up with Dr.  Geronimo whom I think can help him the best in evaluating the possibility of interstitial lung disease  Has recently been on a course of steroids for elevated sed rate  Regarding his sleep - Continue CPAP therapy - no changes made during his visit today - Will obtain an overnight oximetry with his CPAP in place  I requested for an appointment to be made to follow-up with Dr. Geronimo soon after his follow-up CT  Encouraged to call with any significant concerns  Jennet Epley MD [facility] 12/20/2023, 11:35 AM  CC: Dorina Loving, [name]

## 2023-12-20 NOTE — Telephone Encounter (Signed)
 Copied from CRM [id number]. Topic: Clinical - Request for Lab/Test Order >> Dec 20, 2023  3:33 PM Chantha C wrote: Reason for CRM: Sonny from [facility] imaging [phone] states there's two orders for CT chest scan clarification if both CT chest high resolution and CT chest without contrast needs to be scheduled? Phone call dropped while waiting for CAL to contact [facility]. Please advise and call back.  Per secure chat with Dr. Neda pt only needs CT Chest High Resolution. I called Sonny to verify this information.

## 2023-12-20 NOTE — Patient Instructions (Signed)
 High-resolution CT scan in about 5 to 6 weeks to allow for time for an infectious process to normalize  Follow-up appointment with Dr. Geronimo soon after the CT  Concern for persistence of ground glass changes with worsening  Obtain overnight oximetry-this should be done with your CPAP in place to assess whether you need oxygen supplementation  Call us  with significant concerns

## 2023-12-24 ENCOUNTER — Encounter: Payer: Self-pay | Admitting: Dermatology

## 2023-12-24 ENCOUNTER — Other Ambulatory Visit (HOSPITAL_COMMUNITY): Payer: Self-pay

## 2023-12-25 ENCOUNTER — Ambulatory Visit: Payer: Self-pay | Admitting: Medical

## 2023-12-25 ENCOUNTER — Ambulatory Visit: Admitting: Medical

## 2023-12-25 ENCOUNTER — Encounter: Payer: Self-pay | Admitting: Medical

## 2023-12-25 VITALS — BP 130/80 | HR 98 | Temp 98.2°F | Resp 15 | Ht 70.0 in | Wt 274.0 lb

## 2023-12-25 DIAGNOSIS — R739 Hyperglycemia, unspecified: Secondary | ICD-10-CM

## 2023-12-25 DIAGNOSIS — R030 Elevated blood-pressure reading, without diagnosis of hypertension: Secondary | ICD-10-CM | POA: Diagnosis not present

## 2023-12-25 DIAGNOSIS — Z8701 Personal history of pneumonia (recurrent): Secondary | ICD-10-CM | POA: Diagnosis not present

## 2023-12-25 DIAGNOSIS — Z23 Encounter for immunization: Secondary | ICD-10-CM | POA: Diagnosis not present

## 2023-12-25 DIAGNOSIS — J849 Interstitial pulmonary disease, unspecified: Secondary | ICD-10-CM

## 2023-12-25 DIAGNOSIS — D696 Thrombocytopenia, unspecified: Secondary | ICD-10-CM | POA: Diagnosis not present

## 2023-12-25 DIAGNOSIS — K760 Fatty (change of) liver, not elsewhere classified: Secondary | ICD-10-CM

## 2023-12-25 LAB — COMPREHENSIVE METABOLIC PANEL WITH GFR
ALT: 35 U/L (ref 0–53)
AST: 53 U/L — ABNORMAL HIGH (ref 0–37)
Albumin: 4 g/dL (ref 3.5–5.2)
Alkaline Phosphatase: 179 U/L — ABNORMAL HIGH (ref 39–117)
BUN: 15 mg/dL (ref 6–23)
CO2: 29 meq/L (ref 19–32)
Calcium: 9.6 mg/dL (ref 8.4–10.5)
Chloride: 102 meq/L (ref 96–112)
Creatinine, Ser: 0.68 mg/dL (ref 0.40–1.50)
GFR: 107.5 mL/min (ref >60.00)
Glucose, Bld: 89 mg/dL (ref 70–99)
Potassium: 4.1 meq/L (ref 3.5–5.1)
Sodium: 140 meq/L (ref 135–145)
Total Bilirubin: 1.1 mg/dL (ref 0.2–1.2)
Total Protein: 7.3 g/dL (ref 6.0–8.3)

## 2023-12-25 LAB — CBC WITH DIFFERENTIAL/PLATELET
Basophils Absolute: 0.1 K/uL (ref 0.0–0.1)
Basophils Relative: 1.4 % (ref 0.0–3.0)
Eosinophils Absolute: 0.3 K/uL (ref 0.0–0.7)
Eosinophils Relative: 3.2 % (ref 0.0–5.0)
HCT: 41.3 % (ref 39.0–52.0)
Hemoglobin: 14.5 g/dL (ref 13.0–17.0)
Lymphocytes Relative: 22.2 % (ref 12.0–46.0)
Lymphs Abs: 1.8 K/uL (ref 0.7–4.0)
MCHC: 35.2 g/dL (ref 30.0–36.0)
MCV: 95.5 fl (ref 78.0–100.0)
Monocytes Absolute: 0.6 K/uL (ref 0.1–1.0)
Monocytes Relative: 7.1 % (ref 3.0–12.0)
Neutro Abs: 5.4 K/uL (ref 1.4–7.7)
Neutrophils Relative %: 66.1 % (ref 43.0–77.0)
Platelets: 233 K/uL (ref 150.0–400.0)
RBC: 4.32 Mil/uL (ref 4.22–5.81)
RDW: 13.1 % (ref 11.5–15.5)
WBC: 8.1 K/uL (ref 4.0–10.5)

## 2023-12-25 LAB — HEMOGLOBIN A1C: Hgb A1c MFr Bld: 4.9 % (ref 4.6–6.5)

## 2023-12-25 NOTE — Addendum Note (Signed)
 Addended by: GERARD CHUCKIE SAILOR on: 12/25/2023 09:33 AM   Modules accepted: Orders

## 2023-12-25 NOTE — Patient Instructions (Signed)
 Pneumonia(health care associated), right lower lobe with acute on chronic hypoxemic respiratory failure Recent hospitalization for acute on chronic hypoxemic respiratory failure and severe sepsis due to right lower lobe pneumonia. Blood cultures grew Staphylococcus epidermidis, likely a contaminant. Treated with antibiotics, was on Augmentin  and Zyvox after discharge. Reports shortness of breath without fever or productive cough. - Order CT scan in 5-6 weeks to monitor pneumonia resolution.(by pulmonlogist) - Consider repeat chest x-ray if symptoms worsen. - Follow up with pulmonologist after CT scan.  ILD(asthma history as well)  Breo Ellipta  and ProAir  albuterol . Reports shortness of breath recently. - Continue Breo Ellipta , one inhalation daily. - Use ProAir  albuterol  as needed. - Consider short course of Medrol  if A1c is within acceptable range. - Monitor for increased shortness of breath or other symptoms.  Elevated bp. Formerly on high dose losartan  100 mg daily. Hypertension management complicated by recent losartan  discontinuation due to hypotension. Current blood pressure is variable, recently 170/90. On recheck better.- Monitor blood pressure daily at home. - Target blood pressure less than 140/90. Would like to see closer to 130/80 - Consider restarting losartan  at a lower dose (25 mg) if blood pressure remains elevated.  Thrombocytopenia, mild, intermittent Intermittent mild thrombocytopenia with stable platelet levels. - Repeat CBC to monitor platelet levels.  Fatty liver Fatty liver noted on ultrasound, no cirrhosis or scarring. Elevated liver enzymes possibly related to past alcohol  use. Reports reduced alcohol  consumption. - Encourage continued reduction in alcohol  consumption.  Follow up date to be determined after lab review.

## 2023-12-25 NOTE — Progress Notes (Signed)
 Subjective:    Patient ID: Ryan Holland, male    DOB: 07-Mar-1970, 53 y.o.   MRN: [id number]  HPI Pt in for follow up post hospitalization.    Chief complaint:   Patient being seen for shortness of breath   HPI:   Patient with sleep apnea, continues to use CPAP on a nightly basis Not having any problems with his CPAP   Following up with Dr. Geronimo for interstitial lung disease Had called in because he was recently treated with a course of antibiotics for possible pneumonia-was treated with a course of Rocephin , Zithromax , vancomycin. He was recently started on prednisone  by Dr. Geronimo a tapering course started on 11/06/2023 Concerned about CT scan findings -Discussed as ground glass changes which according to patient had been mentioned before  He has a 40-pack-year smoking history Worked in the [employer] but recently-mostly office-based Chronic cough   Abnormal CT scan was initially noted in 2024 describing ground glass changes Repeat CT scan did show some fleeting but persistence of infiltrate in the right lung He had a recent CT scan during his recent hospitalization that still showed ground glass changes   Multiple CTs was reviewed with the patient in the office today with some improvement in the ground glass changes at the left mid lung, left base but persistence in the right lung   Has a history of asthma, on Biologics, follows up with Dr. Geronimo   His weight has been stable he does still feel tired during the day   Does have a history of chronic cough   Continues to use his CPAP, not complaining about his CPAP at present   Assessment:  History of cough variant asthma   Persistent abnormal CT scan of the chest with interstitial changes, ground glass changes with some concern with persistence in the right lung infiltrate   Recent hospitalization and treatment for pneumonia   Obstructive sleep apnea on CPAP therapy - Has been using oxygen  supplementation, sometimes uses his oxygen tube under is CPAP mask - Was not prescribed oxygen therapy for his obstructive sleep apnea and nocturnal hypoxemia - We will follow-up on this   Plan/Recommendations: Reason for the visit today is concerned about CT scan findings   I believe is appropriate to repeat his CT in about 5 to 6 weeks to compare with most recent CT scan allowing for time for pneumonic infiltrate to improve   With the persistence of these findings, may need further evaluation and intervention which may include a biopsy of his lungs to ascertain his diagnosis - He is already following up with Dr. Geronimo whom I think can help him the best in evaluating the possibility of interstitial lung disease   Has recently been on a course of steroids for elevated sed rate   Regarding his sleep - Continue CPAP therapy - no changes made during his visit today - Will obtain an overnight oximetry with his CPAP in place   I requested for an appointment to be made to follow-up with Dr. Geronimo soon after his follow-up CT    Hospital note Dc summary.    Admitted From: Home Disposition: Home   Recommendations for Outpatient Follow-up:  Follow up with PCP in 1-2 weeks Please obtain BMP/CBC in one week Please follow up with your PCP on the following pending results: Unresulted Labs (From admission, onward)              Home Health: None Equipment/Devices: None   Discharge  Condition: Stable CODE STATUS: Full code Diet recommendation:  Diet Order                  Diet Heart Room service appropriate? Yes; Fluid consistency: Thin  Diet effective now                         Subjective: Patient seen and examined, he has no complaints.  Feeling much better and eager to go home.  I also discussed with his wife about plan of care and answered several questions over the speaker phone in his room.   Brief/Interim Summary: Ryan Holland is a 53 y.o. male with  medical history significant of hypertension, hyperlipidemia, anxiety, GERD, OSA on CPAP, class III obesity, asthma, alcohol  use disorder, hepatic steatosis, alcoholic hepatitis, splenomegaly, thrombocytopenia, hyponatremia presented with a chief complaint of shortness of breath.  Patient states he was admitted to the hospital for pneumonia back in July and was discharged on a steroid taper.  His symptoms initially improved but then he started having shortness of breath again so he was seen by pulmonology in early September and started on a 6-week prednisone  taper which he is about to finish this week but for the past few days he is having increasing shortness of breath and hypoxia.  His pulse ox was as low as 70s at home.  When he left the hospital in July, he was sent home on supplemental oxygen which he did not require until a few days ago.  He is using 2 L nasal cannula.  Also having low-grade fevers and chills for the past few days.  No cough or chest pain.   ED Course: Febrile and tachycardic on arrival.  Labs notable for WBC count 11.6, platelet count 126k, blood cultures in process, troponin negative, COVID/influenza/RSV PCR negative, proBNP 320, D-dimer 0.65.  CTA chest negative for PE but showing findings concerning for pulmonary edema versus atypical pneumonia versus acute hypersensitivity pneumonitis.  Also showing small bilateral pleural effusions.  EKG showing sinus tachycardia and no acute ischemic changes.  Patient was given Tylenol , DuoNeb, ceftriaxone , and azithromycin .  Details of hospitalization below.    Acute on chronic hypoxemic respiratory failure and severe sepsis secondary to healthcare associated pneumonia/gram-positive bacteremia, POA, Meets criteria for healthcare associated pneumonia due to being hospitalized for 6 days with the last 90 days.  Patient was discharged from the hospital on 09/29/2023 with 2 L of oxygen however he was not using that until few days ago.  D-dimer  positive but CTA chest negative for PE but showing findings concerning for pulmonary edema versus atypical pneumonia versus acute hypersensitivity pneumonitis.  proBNP 320.  Echo done in July shows normal ejection fraction and no diastolic dysfunction.  Tested negative for COVID, RSV, flu and respiratory viral panel.  Given 1 dose of IV Lasix  20 mg in the ED.  one of two blood cx bottles collected on 12/14/23 at 0731 now has GPC (BCID= staph epi, mecA/C+).  Patient is now afebrile, last temperature spike of 101.5 around 11 PM on 12/15/2023.  Patient did require 4 L of oxygen on day 1 of hospitalization but he has been on room air for 2 days now.  He was treated with Rocephin , Zithromax  and vancomycin.  To the fact that his blood culture was growing Staph epidermidis and only 1 set of blood culture was drawn, we suspect that this is likely contaminant.  Regardless, since this is recurrent hospitalization for similar complaint/pneumonia,  we have decided to treat this for total of 7 days, patient has received 3 days of antibiotics, discharging on 4 more days of oral Augmentin  with Zyvox.   Mild thrombocytopenia In the setting of infection.  Resolved.   Asthma Stable, no wheezing.  He is on biweekly Dupixent  injections.  Continue Singulair , Breo, and albuterol  as needed.   Hyperlipidemia He is on biweekly Repatha  injections.   Anxiety Continue Celexa .  Patient states he does not take Klonopin .   GERD Continue Protonix .   OSA Continue CPAP at night.   History of alcohol  use No signs of withdrawal at this time.  Placed on CIWA monitoring.   Elevated LFTs/hyperbilirubinemia: Patient has been having elevated LFTs and hyperbilirubinemia since 2 to 3 months, AST is actually improving and ALT has normalized and bilirubin improving.  He underwent CT abdomen pelvis during previous hospitalization which showed normal liver.  Monitor closely.   Class III morbid obesity: BMI almost 40.  Weight loss and  dietary modification  counseled.   Discharge plan was discussed with patient and/or family member and they verbalized understanding and agreed with it.   Discharge Diagnoses:   Principal Problem:   Acute respiratory failure with hypoxia (HCC) Active Problems:   OSA on CPAP   SIRS (systemic inflammatory response syndrome) (HCC)   Thrombocytopenia   Asthma, chronic     Ryan Holland is a 53 year old male with chronic respiratory issues who presents with ongoing shortness of breath and recent hospitalization for pneumonia.  He has been experiencing ongoing respiratory issues for approximately a year and a half, primarily affecting his right lung. He was hospitalized in July for ten days and was discharged on two liters of oxygen. After feeling well for a couple of weeks post-discharge, he began experiencing symptoms again, including coughing and shortness of breath. He was treated with six weeks of prednisone , but symptoms returned as he tapered off the medication, leading to another hospitalization.  He was treated with azithromycin , rocephin , vancomycin, and discharged on Augmentin  and Zyvox. A blood culture showed staph epidermidis, but it was considered a possible false positive due to only one set of tests being conducted.  He uses Breo Ellipta  once daily and ProAir  albuterol  three to four times a day. No fevers, chills, sweats, or productive cough since discharge but notes persistent shortness of breath over the last few days.  He has a history of low platelet counts, which have been trending downward since the beginning of the year. He has elevated liver enzymes and a history of alcohol  use, which he has significantly reduced. An ultrasound indicated fatty liver.  His blood pressure was initially high at 170/90, and losartan  was discontinued during his last hospitalization in July due to low blood pressure readings. He was previously on losartan  for twenty years. He is  monitoring his blood pressure at home to determine if medication adjustment is necessary.   Review of Systems  Constitutional:  Negative for appetite change and diaphoresis.  HENT:  Negative for dental problem.   Respiratory:  Positive for shortness of breath. Negative for cough, chest tightness and wheezing.        Mild dyspnea past 2 days.  Cardiovascular:  Negative for chest pain and palpitations.  Gastrointestinal:  Negative for abdominal pain and blood in stool.  Genitourinary:  Negative for dysuria and hematuria.  Musculoskeletal:  Negative for back pain and myalgias.  Skin:  Negative for rash.  Neurological:  Negative for dizziness, seizures, weakness  and headaches.  Hematological:  Negative for adenopathy.  Psychiatric/Behavioral:  Negative for behavioral problems and dysphoric mood.     Past Medical History:  Diagnosis Date   Anxiety    GERD (gastroesophageal reflux disease)    OCCAS-WOULD USE OTC MED IF NEEDED   HNP (herniated nucleus pulposus), lumbar    PAIN IN LOWER BACK AND PAIN DOWN BOTH LEGS -12-24-14 radiates down right leg presently.   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 Corey   Sleep apnea    HAS CPAP-DOES NOT USE-DOES NOT KNOW SETTINGS     Social History   Socioeconomic History   Marital status: Married    Spouse name: Not on file   Number of children: Not on file   Years of education: Not on file   Highest education level: Not on file  Occupational History   Not on file  Tobacco Use   Smoking status: Former    Current packs/day: 0.00    Average packs/day: 2.0 packs/day for 20.0 years (40.0 ttl pk-yrs)    Types: Cigarettes    Start date: 10/05/1991    Quit date: 10/05/2011    Years since quitting: 12.2   Smokeless tobacco: Former    Types: Chew  Substance and Sexual Activity   Alcohol  use: Yes    Alcohol /week: 0.0 standard drinks of alcohol     Comment: 2 MIXED DRINKS (VODKA)  DAILY-usually    Drug use: No   Sexual activity: Not on file  Other Topics Concern   Not on file  Social History Narrative   Not on file   Social Drivers of Health   Financial Resource Strain: Not on file  Food Insecurity: No Food Insecurity (12/15/2023)   Hunger Vital Sign    Worried About Running Out of Food in the Last Year: Never true    Ran Out of Food in the Last Year: Never true  Transportation Needs: No Transportation Needs (12/15/2023)   PRAPARE - Administrator, Civil Service (Medical): No    Lack of Transportation (Non-Medical): No  Physical Activity: Not on file  Stress: Not on file  Social Connections: Moderately Isolated (09/24/2023)   Social Connection and Isolation Panel    Frequency of Communication with Friends and Family: Twice a week    Frequency of Social Gatherings with Friends and Family: Once a week    Attends Religious Services: Never    Database administrator or Organizations: No    Attends Banker Meetings: Never    Marital Status: Married  Catering manager Violence: Not At Risk (12/15/2023)   Humiliation, Afraid, Rape, and Kick questionnaire    Fear of Current or Ex-Partner: No    Emotionally Abused: No    Physically Abused: No    Sexually Abused: No    Past Surgical History:  Procedure Laterality Date   01/05/2011 LUMBAR DECOMPRESSION AND MICRODISCECTOMY     CERVICAL FUSION  C4-5      ESOPHAGOGASTRODUODENOSCOPY (EGD) WITH PROPOFOL  N/A 12/30/2014   Procedure: ESOPHAGOGASTRODUODENOSCOPY (EGD) WITH PROPOFOL ;  Surgeon: Elsie Cree, MD;  Location: [facility];  Service: Endoscopy;  Laterality: N/A;  With Bravo   LUMBAR LAMINECTOMY/DECOMPRESSION MICRODISCECTOMY  07/26/2011   Procedure: LUMBAR LAMINECTOMY/DECOMPRESSION MICRODISCECTOMY;  Surgeon: [name], MD;  Location: [facility];  Service: Orthopedics;  Laterality: Right;  Re-Do Lumbar Decompression L5-S1 on Right,    PILONIDAL CYST EXCISION      Family  History   Problem Relation Age of Onset   COPD Father    Congestive Heart Failure Father    Breast cancer Mother     Allergies  Allergen Reactions   Other     LEMON-sores in mouth    Current Outpatient Medications on File Prior to Visit  Medication Sig Dispense Refill   albuterol  (PROAIR  HFA) 108 (90 Base) MCG/ACT inhaler Inhale 2 puffs into the lungs every 4 (four) hours as needed for wheezing or shortness of breath. 18 g 5   benzonatate  (TESSALON ) 100 MG capsule Take 1 capsule (100 mg total) by mouth 3 (three) times daily as needed for cough. 30 capsule 0   cholecalciferol (VITAMIN D3) 25 MCG (1000 UNIT) tablet Take 1,000 Units by mouth as directed.     citalopram  (CELEXA ) 40 MG tablet Take 1 tablet (40 mg total) by mouth daily. 90 tablet 0   diphenhydrAMINE (BENADRYL) 25 mg capsule Take 50 mg by mouth in the morning.     Dupilumab  (DUPIXENT ) 300 MG/2ML SOAJ Inject 300 mg into the skin every 14 (fourteen) days. 12 mL 1   Evolocumab  (REPATHA  SURECLICK) 140 MG/ML SOAJ Inject 140 mg into the skin every 14 (fourteen) days. 6 mL 1   fluticasone  (FLONASE ) 50 MCG/ACT nasal spray Place 2 sprays into both nostrils daily. 16 g 0   fluticasone  furoate-vilanterol (BREO ELLIPTA ) 200-25 MCG/ACT AEPB Inhale 1 puff into the lungs daily. 60 each 5   methocarbamol  (ROBAXIN ) 500 MG tablet Take 1 tablet by mouth every  8 hours as needed for muscle spams. 30 tablet 0   montelukast  (SINGULAIR ) 10 MG tablet Take 1 tablet (10 mg total) by mouth daily. 90 tablet 3   pantoprazole  (PROTONIX ) 40 MG tablet Take 1 tablet (40 mg total) by mouth daily. 90 tablet 3   clonazePAM  (KLONOPIN ) 0.5 MG tablet Take 1 tablet (0.5 mg total) by mouth at bedtime as needed anxiety and insomnia (Patient not taking: Reported on 12/25/2023) 10 tablet 0   No current facility-administered medications on file prior to visit.    BP 130/80   Pulse 98   Temp 98.2 F (36.8 C) (Oral)   Resp 15   Ht 5' 10 (1.778 m)   Wt 274 lb (124.3 kg)    SpO2 95%   BMI 39.31 kg/m        Objective:   Physical Exam  General Mental Status- Alert. General Appearance- Not in acute distress.   Skin General: Color- Normal Color. Moisture- Normal Moisture.  Neck  No JVD.  Chest and Lung Exam Auscultation: Breath Sounds:-CTA  Cardiovascular Auscultation:Rythm- RRR Murmurs & Other Heart Sounds:Auscultation of the heart reveals- No Murmurs.  Abdomen Inspection:-Inspeection Normal. Palpation/Percussion:Note:No mass. Palpation and Percussion of the abdomen reveal- Non Tender, Non Distended + BS, no rebound or guarding.   Neurologic Cranial Nerve exam:- CN III-XII intact(No nystagmus), symmetric smile. Strength:- 5/5 equal and symmetric strength both upper and lower extremities.    Lower ext- no pedal edema, negative homans signs. Calfs symmetric.     Assessment & Plan:   Pneumonia(health care associated), right lower lobe with acute on chronic hypoxemic respiratory failure Recent hospitalization for acute on chronic hypoxemic respiratory failure and severe sepsis due to right lower lobe pneumonia. Blood cultures grew Staphylococcus epidermidis, likely a contaminant. Treated with antibiotics, was on Augmentin  and Zyvox after discharge. Reports shortness of breath without fever or productive cough. - Order CT scan in 5-6 weeks to monitor pneumonia resolution.(by pulmonlogist) - Consider repeat  chest x-ray if symptoms worsen. - Follow up with pulmonologist after CT scan.  ILD(asthma history as well)  Breo Ellipta  and ProAir  albuterol . Reports shortness of breath recently. - Continue Breo Ellipta , one inhalation daily. - Use ProAir  albuterol  as needed. - Consider short course of Medrol  if A1c is within acceptable range. - Monitor for increased shortness of breath or other symptoms.  Elevated bp. Formerly on high dose losartan  100 mg daily. Hypertension management complicated by recent losartan  discontinuation due to hypotension.  Current blood pressure is variable, recently 170/90. On recheck better.- Monitor blood pressure daily at home. - Target blood pressure less than 140/90. Would like to see closer to 130/80 - Consider restarting losartan  at a lower dose (25 mg) if blood pressure remains elevated.  Thrombocytopenia, mild, intermittent Intermittent mild thrombocytopenia with stable platelet levels. - Repeat CBC to monitor platelet levels.  Fatty liver Fatty liver noted on ultrasound, no cirrhosis or scarring. Elevated liver enzymes possibly related to past alcohol  use. Reports reduced alcohol  consumption. - Encourage continued reduction in alcohol  consumption.  Follow up date to be determined after lab review.  Ryan Baroni, [name]   I personally spent a total of 44 minutes in the care of the patient today including getting/reviewing separately obtained history, performing a medically appropriate exam/evaluation, counseling and educating, placing orders, and documenting clinical information in the EHR.

## 2023-12-26 ENCOUNTER — Encounter: Payer: Self-pay | Admitting: Dermatology

## 2023-12-26 ENCOUNTER — Ambulatory Visit: Admitting: Dermatology

## 2023-12-26 VITALS — BP 120/70 | HR 100 | Temp 98.5°F

## 2023-12-26 DIAGNOSIS — D0339 Melanoma in situ of other parts of face: Secondary | ICD-10-CM

## 2023-12-26 DIAGNOSIS — C439 Malignant melanoma of skin, unspecified: Secondary | ICD-10-CM

## 2023-12-26 DIAGNOSIS — L578 Other skin changes due to chronic exposure to nonionizing radiation: Secondary | ICD-10-CM | POA: Diagnosis not present

## 2023-12-26 DIAGNOSIS — L814 Other melanin hyperpigmentation: Secondary | ICD-10-CM | POA: Diagnosis not present

## 2023-12-26 NOTE — Patient Instructions (Signed)

## 2023-12-26 NOTE — Progress Notes (Signed)
 Follow-Up Visit   Subjective  Ryan Holland is a 53 y.o. male who presents for the following: Mohs of Melanoma in Situ, Lentigo Maligna type, of the left preauricular area, biopsied by Dr. Corey  The following portions of the chart were reviewed this encounter and updated as appropriate: medications, allergies, medical history  Review of Systems:  No other skin or systemic complaints except as noted in HPI or Assessment and Plan.  Objective  Well appearing patient in no apparent distress; mood and affect are within normal limits.  A focused examination was performed of the following areas: Left preauricular area Relevant physical exam findings are noted in the Assessment and Plan.   Left Preauricular Area Irregularly pigmented brown patch   Assessment & Plan   MALIGNANT MELANOMA OF SKIN (HCC) Left Preauricular Area Mohs surgery  Consent obtained: written  Anticoagulation: Was the anticoagulation regimen changed prior to Mohs? No    Anesthesia: Anesthesia method: local infiltration Local anesthetic: lidocaine  1% WITH epi  Procedure Details: Timeout: pre-procedure verification complete Procedure Prep: patient was prepped and draped in usual sterile fashion Prep type: chlorhexidine  Biopsy accession number: [id number] Pre-Op diagnosis: melanoma Melanoma subtype: in situ Melanoma histologic subtype: lentigo maligna MohsAIQ Surgical site (if tumor spans multiple areas, please select predominant area): cheek (including jawline) Surgery side: left Surgical site (from skin exam): Left Preauricular Area Pre-operative length (cm): 1.2 Pre-operative width (cm): 1 Indications for Mohs surgery: anatomic location where tissue conservation is critical, ill-defined borders and aggressive histology  Micrographic Surgery Details: Post-operative length (cm): 2.2 Post-operative width (cm): 2.2 Number of Mohs stages: 2 Post surgery depth of defect: subcutaneous fat  Stage 1     Tumor features identified on Mohs section: melanoma    Depth of tumor invasion after stage: dermis  Stage 2    Tumor features identified on Mohs section: no tumor identified  Reconstruction: Was the defect reconstructed? Yes   Was reconstruction performed by the same Mohs surgeon? Yes   Setting of reconstruction: outpatient office When was reconstruction performed? same day Type of reconstruction: linear Linear reconstruction: complex  Skin repair Complexity:  Complex Final length (cm):  6.2 Informed consent: discussed and consent obtained   Timeout: patient name, date of birth, surgical site, and procedure verified   Procedure prep:  Patient was prepped and draped in usual sterile fashion Prep type:  Chlorhexidine  Anesthesia: the lesion was anesthetized in a standard fashion   Anesthetic:  1% lidocaine  w/ epinephrine  1-100,000 buffered w/ 8.4% NaHCO3 Reason for type of repair: reduce tension to allow closure, allow closure of the large defect, preserve normal anatomy and allow side-to-side closure without requiring a flap or graft   Undermining: area extensively undermined   Subcutaneous layers (deep stitches):  Suture size:  5-0 Suture type: Monocryl (poliglecaprone 25)   Stitches:  Buried vertical mattress Fine/surface layer approximation (top stitches):  Suture size:  6-0 Suture type: fast-absorbing plain gut   Stitches: simple running   Hemostasis achieved with: suture, pressure and electrodesiccation Outcome: patient tolerated procedure well with no complications   Post-procedure details: sterile dressing applied and wound care instructions given   Dressing type: bandage and pressure dressing      Return in about 4 weeks (around 01/23/2024) for wound check.  LILLETTE Darice Smock, CMA, am acting as scribe for RUFUS CHRISTELLA COREY, MD.    12/26/2023  HISTORY OF PRESENT ILLNESS  Ryan Holland is seen in consultation at the request of Dr. Corey for biopsy-proven Melanoma in  Situ, Lentigo Maligna type of the left preauricular region. They note that the area has been present for about 6 months increasing in size with time.  There is no history of previous treatment.  Reports no other new or changing lesions and has no other complaints today.  Medications and allergies: see patient chart.  Review of systems: Reviewed 8 systems and notable for the above skin cancer.  All other systems reviewed are unremarkable/negative, unless noted in the HPI. Past medical history, surgical history, family history, social history were also reviewed and are noted in the chart/questionnaire.    PHYSICAL EXAMINATION  General: Well-appearing, in no acute distress, alert and oriented x 4. Vitals reviewed in chart (if available).   Skin: Exam reveals a 1.2 x 1.0 cm erythematous papule and biopsy scar on the left preauricular cheek. There are rhytids, telangiectasias, and lentigines, consistent with photodamage.  Biopsy report(s) reviewed, confirming the diagnosis.   ASSESSMENT  1) Melanoma in Situ, Lentigo Maligna type, of the left preauricular cheek 2) photodamage 3) solar lentigines   PLAN   1. Due to location, size, histology, or recurrence and the likelihood of subclinical extension as well as the need to conserve normal surrounding tissue, the patient was deemed acceptable for Mohs micrographic surgery (MMS).  The nature and purpose of the procedure, associated benefits and risks including recurrence and scarring, possible complications such as pain, infection, and bleeding, and alternative methods of treatment if appropriate were discussed with the patient during consent. The lesion location was verified by the patient, by reviewing previous notes, pathology reports, and by photographs as well as angulation measurements if available.  Informed consent was reviewed and signed by the patient, and timeout was performed at 8:15 AM. See op note below.  2. For the photodamage and solar  lentigines, sun protection discussed/information given on OTC sunscreens, and we recommend continued regular follow-up with primary dermatologist every 6 months or sooner for any growing, bleeding, or changing lesions. 3. Prognosis and future surveillance discussed. 4. Letter with treatment outcome sent to referring provider. 5. Pain acetaminophen /ibuprofen  MOHS MICROGRAPHIC SURGERY AND RECONSTRUCTION  Initial size:   1.2 x 1.0 cm Surgical defect/wound size: 2.2 x 2.2 cm Anesthesia:    0.33% lidocaine  with 1:200,000 epinephrine  EBL:    <5 mL Complications:  None Repair type:   Complex SQ suture:   5-0 Monocryl Cutaneous suture:  6-0 Plain gut Final size of the repair: 6.2 cm  Stages: 2  STAGE I: Anesthesia achieved with 0.5% lidocaine  with 1:200,000 epinephrine . ChloraPrep applied. 5 section(s) excised using Mohs technique (this includes total peripheral and deep tissue margin excision and evaluation with frozen sections, excised and interpreted by the same physician). The tumor was first debulked and then excised with an approx. 2 mm margin.  Hemostasis was achieved with electrocautery as needed.  The specimen was then oriented, subdivided/relaxed, inked, and processed using Mohs technique.  Tissue was stained with H&E and MART-1 with 2 chromogens (2 immunostains).  Frozen section analysis revealed a positive margin for atypical proliferation of melanocytes arranged in a confluent and pagetoid pattern along the basal and suprabasal layers. The melanocytes exhibit nuclear pleomorphism, hyperchromasia, and prominent nucleoli. There is an increased number of melanocytes at the dermoepidermal junction with focal nesting and single-cell spread. No evidence of dermal invasion is seen in the peripheral margin.    STAGE II: An additional 2 mm margin was excised.  Hemostasis was achieved with electrocautery as needed.  The specimen was then oriented,  subdivided/relaxed, inked, and processed using  Mohs technique. Evaluation of slides by the Mohs surgeon revealed clear tumor margins. Tissue was stained with H&E and MART-1 with 2 chromogens (2 immunostains).   Reconstruction  The surgical wound was then cleaned, prepped, and re-anesthetized as above. Wound edges were undermined extensively along at least one entire edge and at a distance equal to or greater than the width of the defect (see wound defect size above) in order to achieve closure and decrease wound tension and anatomic distortion. Redundant tissue repair including standing cone removal was performed. Hemostasis was achieved with electrocautery. Subcutaneous and epidermal tissues were approximated with the above sutures. The surgical site was then lightly scrubbed with sterile, saline-soaked gauze. The area was then bandaged using Vaseline ointment, non-adherent gauze, gauze pads, and tape to provide an adequate pressure dressing. The patient tolerated the procedure well, was given detailed written and verbal wound care instructions, and was discharged in good condition.   The patient will follow-up: 4 weeks.   Documentation: I have reviewed the above documentation for accuracy and completeness, and I agree with the above.  RUFUS CHRISTELLA HOLY, MD

## 2023-12-28 ENCOUNTER — Other Ambulatory Visit: Payer: Self-pay | Admitting: Medical

## 2023-12-28 ENCOUNTER — Other Ambulatory Visit (HOSPITAL_COMMUNITY): Payer: Self-pay

## 2023-12-28 ENCOUNTER — Other Ambulatory Visit: Payer: Self-pay

## 2023-12-28 DIAGNOSIS — G8929 Other chronic pain: Secondary | ICD-10-CM

## 2023-12-28 MED ORDER — METHOCARBAMOL 500 MG PO TABS
500.0000 mg | ORAL_TABLET | Freq: Three times a day (TID) | ORAL | 0 refills | Status: DC | PRN
  Filled 2023-12-28: qty 30, 10d supply, fill #0

## 2024-01-03 ENCOUNTER — Encounter: Payer: Self-pay | Admitting: Dermatology

## 2024-01-03 DIAGNOSIS — R0902 Hypoxemia: Secondary | ICD-10-CM | POA: Diagnosis not present

## 2024-01-03 DIAGNOSIS — G4730 Sleep apnea, unspecified: Secondary | ICD-10-CM | POA: Diagnosis not present

## 2024-01-08 ENCOUNTER — Encounter: Payer: Self-pay | Admitting: Pulmonary Disease

## 2024-01-08 ENCOUNTER — Encounter: Admitting: Dermatology

## 2024-01-11 ENCOUNTER — Ambulatory Visit: Payer: Self-pay | Admitting: Pulmonary Disease

## 2024-01-15 ENCOUNTER — Ambulatory Visit
Admission: RE | Admit: 2024-01-15 | Discharge: 2024-01-15 | Disposition: A | Discharge status: Home / Self Care | Source: Ambulatory Visit | Attending: Pulmonary Disease | Admitting: Pulmonary Disease

## 2024-01-15 ENCOUNTER — Other Ambulatory Visit

## 2024-01-15 ENCOUNTER — Other Ambulatory Visit: Payer: Self-pay

## 2024-01-15 DIAGNOSIS — J849 Interstitial pulmonary disease, unspecified: Secondary | ICD-10-CM

## 2024-01-15 DIAGNOSIS — J479 Bronchiectasis, uncomplicated: Secondary | ICD-10-CM | POA: Diagnosis not present

## 2024-01-17 ENCOUNTER — Other Ambulatory Visit (HOSPITAL_COMMUNITY): Payer: Self-pay

## 2024-01-19 ENCOUNTER — Encounter: Payer: Self-pay | Admitting: Internal Medicine

## 2024-01-20 ENCOUNTER — Other Ambulatory Visit: Payer: Self-pay | Admitting: Medical

## 2024-01-20 ENCOUNTER — Other Ambulatory Visit: Payer: Self-pay | Admitting: Internal Medicine

## 2024-01-20 DIAGNOSIS — G8929 Other chronic pain: Secondary | ICD-10-CM

## 2024-01-21 ENCOUNTER — Other Ambulatory Visit (HOSPITAL_COMMUNITY): Payer: Self-pay

## 2024-01-21 MED ORDER — FLUTICASONE FUROATE-VILANTEROL 200-25 MCG/ACT IN AEPB
1.0000 | INHALATION_SPRAY | Freq: Every day | RESPIRATORY_TRACT | 5 refills | Status: AC
  Filled 2024-01-21 – 2024-01-29 (×2): qty 60, 30d supply, fill #0
  Filled 2024-01-29: qty 60, 60d supply, fill #0
  Filled 2024-02-24: qty 60, 30d supply, fill #1
  Filled 2024-04-01: qty 60, 30d supply, fill #2

## 2024-01-21 MED ORDER — METHOCARBAMOL 500 MG PO TABS
500.0000 mg | ORAL_TABLET | Freq: Three times a day (TID) | ORAL | 0 refills | Status: AC | PRN
  Filled 2024-01-21 – 2024-01-29 (×3): qty 30, 10d supply, fill #0

## 2024-01-21 NOTE — Telephone Encounter (Signed)
 Dr. Geronimo, Patient is requesting results of CT scan.  Please advise.  Thank you.

## 2024-01-22 ENCOUNTER — Other Ambulatory Visit (HOSPITAL_COMMUNITY): Payer: Self-pay

## 2024-01-23 ENCOUNTER — Ambulatory Visit: Admitting: Internal Medicine

## 2024-01-23 ENCOUNTER — Encounter: Payer: Self-pay | Admitting: Internal Medicine

## 2024-01-23 VITALS — BP 132/76 | HR 79 | Temp 98.6°F | Ht 70.0 in | Wt 275.4 lb

## 2024-01-23 DIAGNOSIS — Z8701 Personal history of pneumonia (recurrent): Secondary | ICD-10-CM

## 2024-01-23 DIAGNOSIS — Z09 Encounter for follow-up examination after completed treatment for conditions other than malignant neoplasm: Secondary | ICD-10-CM

## 2024-01-23 DIAGNOSIS — J9601 Acute respiratory failure with hypoxia: Secondary | ICD-10-CM | POA: Diagnosis not present

## 2024-01-23 DIAGNOSIS — R7689 Other specified abnormal immunological findings in serum: Secondary | ICD-10-CM

## 2024-01-23 DIAGNOSIS — R0609 Other forms of dyspnea: Secondary | ICD-10-CM

## 2024-01-23 DIAGNOSIS — R918 Other nonspecific abnormal finding of lung field: Secondary | ICD-10-CM | POA: Diagnosis not present

## 2024-01-23 LAB — CBC WITH DIFFERENTIAL/PLATELET
Basophils Absolute: 0.1 K/uL (ref 0.0–0.1)
Basophils Relative: 1.4 % (ref 0.0–3.0)
Eosinophils Absolute: 0.4 K/uL (ref 0.0–0.7)
Eosinophils Relative: 4.3 % (ref 0.0–5.0)
HCT: 42.9 % (ref 39.0–52.0)
Hemoglobin: 14.9 g/dL (ref 13.0–17.0)
Lymphocytes Relative: 23 % (ref 12.0–46.0)
Lymphs Abs: 1.9 K/uL (ref 0.7–4.0)
MCHC: 34.6 g/dL (ref 30.0–36.0)
MCV: 97.2 fl (ref 78.0–100.0)
Monocytes Absolute: 0.8 K/uL (ref 0.1–1.0)
Monocytes Relative: 10 % (ref 3.0–12.0)
Neutro Abs: 4.9 K/uL (ref 1.4–7.7)
Neutrophils Relative %: 61.3 % (ref 43.0–77.0)
Platelets: 197 K/uL (ref 150.0–400.0)
RBC: 4.41 Mil/uL (ref 4.22–5.81)
RDW: 15 % (ref 11.5–15.5)
WBC: 8.1 K/uL (ref 4.0–10.5)

## 2024-01-23 LAB — SEDIMENTATION RATE: Sed Rate: 24 mm/h — ABNORMAL HIGH (ref 0–20)

## 2024-01-23 LAB — BRAIN NATRIURETIC PEPTIDE: Pro B Natriuretic peptide (BNP): 24 pg/mL (ref 0.0–100.0)

## 2024-01-23 NOTE — Progress Notes (Signed)
 OV 11/20/2022  Subjective:-History from the patient and also review of the external medical record.  Patient ID: Ryan Holland, male , DOB: Jul 06, 1970 , age 53 y.o. , MRN: [id number] , ADDRESS: [address] [place] [id number] PCP Saguier, [place] RIGGERS Patient Care Team: Saguier, Edward, [name] as PCP - General (Internal Medicine)  This Provider for this visit: Treatment Team:  Attending Provider: Geronimo Amel, MD    11/20/2022 -   Chief Complaint  Patient presents with   Pulmonary Consult    Referred by [place] Maxwell, PA. Pt states have had allergies forever- increased SOB since July 2024- gets winded sometimes just at rest and with minimal exertion. He has had cough- occ prod with clear sputum.      HPI Ryan Holland 53 y.o. -works in [employer].  Following 40 pack smoking history but quit.  He tells me that he has a history of chronic cough for many years.  This is diagnosed secondary to allergies.  He used to follow with [facility].  Review extramedical records I am not able to find any allergy testing but he states allergy testing was positive.  It appears several years ago he had autoimmune profile the results are not available but I am presuming it is negative.  He was never on any asthma inhalers.  Since July 2024 he had insidious onset of shortness of breath associated with dizziness.  Dizziness happens randomly.  Shortness of breath is present with exertion relieved by rest.  After he went on Trelegy the shortness of breath did get better.  His baseline cough started going up.  Now for the last 1 or 2 weeks he has a dull pain in his right breast area.  He also has noticed some random tingling once or twice on his right arm.  The symptoms are associated with tachycardia particularly the dizziness.  He says even at rest he has tachycardia.  Sometimes the heart rate even at rest is 100-120 [90-99/min today at rest].   Review of the records  indicate that as of June 2024 he was doing okay.  Then on October 26, 2022 he did see primary care office.  For episodes of vertigo.  This is what he says the dizziness.  He points to the sinus area for this.  He was also having hypertension.  He did report his chronic cough.  At this visit he said the [facility] was not helping but today he did tell me the [facility] did help.  This subsequently resulted in a CT angiogram chest to rule out pulmonary embolism but did show a right sided groundglass opacities that I personally visualized and confirmed.  He also had Doppler lower extremities and this was negative.  He was given antibiotics both azithromycin  and Z-Pak.  Then on Labor Day weekend he was in the bass outlet at [place].  There when he went outside he got very tachycardic to a heart rate of 145 and also had blurred vision.  He did fall follow-up after this with primary care.  He is due dimer and BNP were normal.  Labs indicate that today exam nitric oxide  is normal [possible Breo effect].  His blood eosinophils are consistently high at 400 cells per cubic millimeter.  Primary care is considered but getting a referral to cardiology for Zio patch.  But he is also been referred here.  CT chest in 2016 was normal.  No ACE inhibitor intake.    X-rays hypoxemia  test sit/stand pulse ox at rest was 99% with a heart rate 90-99.  After sitting and standing 10 times he was short of breath 5 out of 10 pulse ox never changed heart rate went up to 110.  He does have sleep apnea he uses his CPAP but has not seen a sleep doctor in many years.   CT Chest data from date: 10/27/22  - personally visualized and independently interpreted : YES - my findings are: AGREE rrative & Impression  CLINICAL DATA:  Shortness of breath with positive D-dimer   EXAM: CT ANGIOGRAPHY CHEST WITH CONTRAST   TECHNIQUE: Multidetector CT imaging of the chest was performed using the standard protocol during bolus administration of  intravenous contrast. Multiplanar CT image reconstructions and MIPs were obtained to evaluate the vascular anatomy.   RADIATION DOSE REDUCTION: This exam was performed according to the departmental dose-optimization program which includes automated exposure control, adjustment of the mA and/or kV according to patient size and/or use of iterative reconstruction technique.   CONTRAST:  OMNIPAQUE  IOHEXOL  350 MG/ML SOLN   COMPARISON:  None Available.   FINDINGS: Cardiovascular: No evidence of pulmonary embolus. Normal heart size. No pericardial effusion. Normal caliber thoracic aorta with mild atherosclerotic disease. No coronary artery calcifications.   Mediastinum/Nodes: Esophagus thyroid  are unremarkable. No enlarged lymph nodes seen in the chest.   Lungs/Pleura: Central airways are patent. Patchy ground-glass opacities which are most pronounced in the right middle lobe and right upper lobe. No consolidation, pleural effusion or pneumothorax. Small solid pulmonary nodule of the right lower lobe measuring 4 mm on series 301, image 78.   Upper Abdomen: Hepatic steatosis.  No acute abnormality.   Musculoskeletal: No aggressive appearing osseous lesions.   Review of the MIP images confirms the above findings.   IMPRESSION: 1. No evidence of pulmonary embolus. 2. Patchy ground-glass opacities which are most pronounced in the right middle lobe and right upper lobe, likely infectious or inflammatory. 3. Small solid pulmonary nodule of the right lower lobe measuring 4 mm. No follow-up needed if patient is low-risk.This recommendation follows the consensus statement: Guidelines for Management of Incidental Pulmonary Nodules Detected on CT Images: From the Fleischner Society 2017; Radiology 2017; 284:228-243. 4. Hepatic steatosis. 5. Mild aortic Atherosclerosis (ICD10-I70.0).     Electronically Signed   By: Rea Marc M.D.   On: 10/27/2022 19:41     OV  01/12/2023  Subjective:  Patient ID: Ryan Holland, male , DOB: [birth year] , age 74 y.o. , MRN: [id number] , ADDRESS: [address] [place] [id number] PCP Saguier, [place] [name] Patient Care Team: [name] RIGGERS as PCP - General (Internal Medicine) Kate Lonni CROME, MD as Consulting Physician (Cardiology)  This Provider for this visit: Treatment Team:  Attending Provider: Geronimo Amel, MD    01/12/2023 -   Chief Complaint  Patient presents with   Follow-up    Ct f/u & pft    Follow-up history of previous allergies but dizziness and worsening of with onset of chronic cough since July 2024: Workup in progress  HPI Louay Myrie 53 y.o. -returns for follow-up to discuss current status and also discussed results.  Went over the history again he tells me that he has had a strong history of allergy issues.  At [facility] in 2017 skin test was positive for dust mite mold and mildew in fescue grass.  I am not able to get access to this but this is his history.  As a result of this he  had seasonal allergy symptoms especially in the spring and the fall.  This gets manifested by tickle in the throat, sinus drainage, runny eyes and red eyes.  Also some wheezing sebacic basically like hayfever.  He was on allergy shots.  In 2017 and 2020 and then stopped because of lack of change.  And then he was not on follow-up.  Most recently symptoms started in July 2024 when he started having dizziness with this the cough became ongoing.  He was has dry heaves and nausea with the cough this chest tightness there is also dizziness.  He said high risk for sleep apnea.  He had pulmonary infiltrates on CT in August 2024  Progress with issue so far -There is prior history of smoking 1 pack a day for 20 years.  - Pulmonary infiltrates on CT chest October 27, 2022: These are significantly improved on the high-resolution CT chest October 2024.  I personally visualized this and showed it to him.   He did have a subsequent CT coronary morphology on 01/08/2023.  No comment is made of the pulmonary infiltrates but I am wondering if it is back.  I am not so sure.  -Associated acid reflux symptoms: He occasionally has acid reflux but denies any overt aspiration.  -Limited allergy workup blood eosinophils and blood IgE is high   -Pulmonary function test suggest restriction for obesity but otherwise normal.  -Potential sleep apnea: Self has not seen a sleep doctor.  I am making a rereferral today.  He does use CPAP.  -Dizziness is seen Dr. Lonni Mas.  Most recent visit 01/03/2023.  Chart reviewed.  Echocardiogram in October 2024 was normal.  ZIO monitor recommended.  -He has morbid obesity 42.3 BMI   OV 10/30/2023  Subjective:  Patient ID: Ryan Holland, male , DOB: 1970/07/12 , age 36 y.o. , MRN: [id number] , ADDRESS: [address] [place] [id number] PCP Saguier, [place] [name] Patient Care Team: [name] RIGGERS as PCP - General (Internal Medicine) Kate Lonni CROME, MD as Consulting Physician (Cardiology)  This Provider for this visit: Treatment Team:  Attending Provider: Geronimo Amel, MD    10/30/2023 -   Chief Complaint  Patient presents with   Medical Management of Chronic Issues   Asthma    Breathing has been slightly worse over the past 2 wks. He has some wheezing and prod cough with clear to green sputum.      HPI Kyel Purk 53 y.o. -Here for follow-up.  Last November 2024.  He was supposed to have CT scan in July 2025 which he did have on July 18.  It showed some of the infiltrates were improved but he had new infiltrates but 2 days later he ended up in the hospital on October 01, 2023 with human metapneumovirus infection and elevated BNP.  CT scan of the chest September 23, 2023 showed marked difference with bilateral bronchopneumonia consistent with viral infection.  He was in the hospital for 10 days discharged on steroids and on oxygen  2 L.  He says that he finished a week of steroids and then started feeling well for 2 weeks but now he is slowly coming back to all his baseline symptoms.  He still using his 2 L oxygen although when we turned it off he was normal on room air at rest and he was able to walk 400 feet without any desaturation.  He has intentionally lost weight around 30 pounds.  In terms of his asthma he continues his  asthma therapy with biologic Dupixent .  Main current issue is that his recent respiratory failure and hospitalization.  He did have a chest x-ray 2 days ago or yesterday and I personally visualized this.  It is improved compared to the hospital but there are still infiltrates.    PLAN  - Was entertaining a condition called BOOP or heart failure.  The BNP is normal.  The ESR is extremely high greater than 80 in the case of BOOP but here it is 40 and it is in the intermediate zone.  Nevertheless I think it is worth giving a 4-week prednisone  course [his blood sugar can go up blood pressure can go up   Plan  - - Start prednisone  -40 mg once daily for 1 weeks and then 30 mg once daily for 1 weeks and then 20 mg once daily for 1 weeks and then 10 mg once daily for 1 weeks then 5 mg once daily for 1 weeks and then 5 mg on Monday Wednesday Friday for 1 weeks and stop (Total 6 week course     OV 01/23/2024  Subjective:  Patient ID: Ryan Holland, male , DOB: 1970/06/20 , age 89 y.o. , MRN: [id number] , ADDRESS: [address] [place] [id number] PCP Saguier, [place] [name] Patient Care Team: [name] RIGGERS as PCP - General (Internal Medicine) Kate Lonni CROME, MD as Consulting Physician (Cardiology)  This Provider for this visit: Treatment Team:  Attending Provider: Geronimo Amel, MD   #Asthma with cough and pulm infiltrates not otherwise specified on biologic therapy #Respiratory failure admission July 2025 due to human metapneumovirus with elevated BNP  01/23/2024 -    Chief Complaint  Patient presents with   Interstitial Lung Disease    Pt states since LOV breathing has been up and down, more down than good SOB w/ any activity      HPI Ryan Holland 53 y.o. - Santhiago Collingsworth is a 53 year old male with interstitial lung disease who presents with recurrent respiratory issues and chest pain. Presents with wife who works at [employer] in pharmacy  He remains on Dupixent  for his asthma.  In July 2025 he was hospitalized for metapneumovirus send in the aftermath of that had continued hypoxemia and pulmonary infiltrates his ESR was slightly high.  Therefore put him on a 6-week prednisone  taper.  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  Right in the 6-week office final dose of prednisone : He had fever in the next day got really short of breath.  He feels the prednisone  taper contributed to this.  He was admitted to hospital CT chest showed significant ground glass opacities.  Although it was not as worse as summer 2025.  It was definitely worse than July 2025.  The procalcitonin was 1.88 respiratory virus panel, urine strep urine Legionella were all negative.  Therefore hospital-acquired pneumonia was diagnosis made.  He is worried it respiratory interstitial inflammation related.  He is also worried that it could be fluid related.  However based on my chart review the overwhelming evidence is that this is hospital-acquired pneumonia.  He got treated with antibiotics he did not get treated with steroids.  Since then he has not relapsed.  He is not on steroids currently.  He describes his chest pain as 'a lot of pressure' in the heart area, which sometimes affects his vision. He has resumed using an oxygen machine due to shortness of breath, which he partly attributes to anxiety, especially during a recent cruise trip with his  grandchildren. His oxygen levels at home sometimes drop into the nineties and high eighties, despite doing well during physical activity in the clinic.   However exercise hypoxemia test here today did not show him having any desaturations.  He has a history of acid reflux for which he takes medication. Coughing sometimes leads to vomiting, although he has not vomited in several months. He has had recurrent respiratory issues over the past few years, with similar episodes occurring twice this year and once the previous year.  He is currently on Dupixent  and Repatha .   He and his wife are concerned about - Unifying diagnosis - Unusual etiologies - Cardiac etiology  Simple office walk 224 (66+46 x 2) feet Pod A at [facility] x  3 laps goal with forehead probe 10/30/2023    O2 used ra   Number laps completed 2 of 3   Comments about pace Regular pace   Resting Pulse Ox/HR 95% and 80/min   Final Pulse Ox/HR 94% and 99/min   Desaturated </= 88% no   Desaturated <= 3% points no   Got Tachycardic >/= 90/min yes   Symptoms at end of test Little dyspneic   Miscellaneous comments ok       PFT     Latest Ref Rng & Units 01/12/2023    8:50 AM 09/10/2014    9:39 AM  PFT Results  FVC-Pre L 3.47  4.20   FVC-Predicted Pre % 68  80   FVC-Post L 3.43  3.94   FVC-Predicted Post % 67  75   Pre FEV1/FVC % % 87  85   Post FEV1/FCV % % 90  85   FEV1-Pre L 3.02  3.57   FEV1-Predicted Pre % 76  85   FEV1-Post L 3.07  3.37   DLCO uncorrected ml/min/mmHg 28.11  27.88   DLCO UNC% % 95  86   DLCO corrected ml/min/mmHg 28.11    DLCO COR %Predicted % 95    DLVA Predicted % 122  109   TLC L 5.63  5.84   TLC % Predicted % 80  84   RV % Predicted % 88  87        LAB RESULTS last 96 hours No results found.       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   reports that he quit smoking about 12 years ago. His smoking use included cigarettes. He started smoking about 32 years ago. He has a 40  pack-year smoking history. He has quit using smokeless tobacco.  His smokeless tobacco use included chew.  Past Surgical History:  Procedure Laterality Date   01/05/2011 LUMBAR DECOMPRESSION AND MICRODISCECTOMY     CERVICAL FUSION  C4-5      ESOPHAGOGASTRODUODENOSCOPY (EGD) WITH PROPOFOL  N/A 12/30/2014   Procedure: ESOPHAGOGASTRODUODENOSCOPY (EGD) WITH PROPOFOL ;  Surgeon: Elsie Cree, MD;  Location: [facility];  Service: Endoscopy;  Laterality: N/A;  With Bravo   LUMBAR LAMINECTOMY/DECOMPRESSION MICRODISCECTOMY  07/26/2011   Procedure: LUMBAR LAMINECTOMY/DECOMPRESSION MICRODISCECTOMY;  Surgeon: [name], MD;  Location: [facility];  Service: Orthopedics;  Laterality: Right;  Re-Do Lumbar Decompression L5-S1 on Right,    PILONIDAL CYST EXCISION      Allergies  Allergen Reactions   Other     LEMON-sores in mouth    Immunization History  Administered Date(s) Administered   Influenza Inj Mdck Quad Pf 11/22/2016  Influenza Whole 12/24/2013, 11/07/2014   Influenza, Seasonal, Injecte, Preservative Fre 11/20/2022, 12/25/2023   Influenza,inj,Quad PF,6+ Mos 11/08/2017, 01/27/2019, 02/20/2022   PFIZER(Purple Top)SARS-COV-2 Vaccination 06/07/2019, 07/08/2019   PNEUMOCOCCAL CONJUGATE-20 08/14/2023   Tdap 11/22/2016   Zoster Recombinant(Shingrix ) 08/14/2023, 12/25/2023    Family History  Problem Relation Age of Onset   COPD Father    Congestive Heart Failure Father    Heart disease Father        died of  congenial heart diseases   Hypertension Father    Breast cancer Mother    Hypertension Mother      Current Outpatient Medications:    albuterol  (PROAIR  HFA) 108 (90 Base) MCG/ACT inhaler, Inhale 2 puffs into the lungs every 4 (four) hours as needed for wheezing or shortness of breath., Disp: 18 g, Rfl: 5   benzonatate  (TESSALON ) 100 MG capsule, Take 1 capsule (100 mg total) by mouth 3 (three) times daily as needed for cough., Disp: 30 capsule, Rfl: 0   cholecalciferol (VITAMIN  D3) 25 MCG (1000 UNIT) tablet, Take 1,000 Units by mouth as directed., Disp: , Rfl:    citalopram  (CELEXA ) 40 MG tablet, Take 1 tablet (40 mg total) by mouth daily., Disp: 90 tablet, Rfl: 0   diphenhydrAMINE (BENADRYL) 25 mg capsule, Take 50 mg by mouth in the morning., Disp: , Rfl:    Dupilumab  (DUPIXENT ) 300 MG/2ML SOAJ, Inject 300 mg into the skin every 14 (fourteen) days., Disp: 12 mL, Rfl: 1   Evolocumab  (REPATHA  SURECLICK) 140 MG/ML SOAJ, Inject 140 mg into the skin every 14 (fourteen) days., Disp: 6 mL, Rfl: 1   fluticasone  (FLONASE ) 50 MCG/ACT nasal spray, Place 2 sprays into both nostrils daily., Disp: 16 g, Rfl: 0   fluticasone  furoate-vilanterol (BREO ELLIPTA ) 200-25 MCG/ACT AEPB, Inhale 1 puff into the lungs daily., Disp: 60 each, Rfl: 5   methocarbamol  (ROBAXIN ) 500 MG tablet, Take 1 tablet by mouth every  8 hours as needed for muscle spams., Disp: 30 tablet, Rfl: 0   montelukast  (SINGULAIR ) 10 MG tablet, Take 1 tablet (10 mg total) by mouth daily., Disp: 90 tablet, Rfl: 3   pantoprazole  (PROTONIX ) 40 MG tablet, Take 1 tablet (40 mg total) by mouth daily., Disp: 90 tablet, Rfl: 3   clonazePAM  (KLONOPIN ) 0.5 MG tablet, Take 1 tablet (0.5 mg total) by mouth at bedtime as needed anxiety and insomnia (Patient not taking: Reported on 01/23/2024), Disp: 10 tablet, Rfl: 0      Objective:   Vitals:   01/23/24 1136  BP: 132/76  Pulse: 79  Temp: 98.6 F (37 C)  TempSrc: Oral  SpO2: 99%  Weight: 275 lb 6.4 oz (124.9 kg)  Height: 5' 10 (1.778 m)    Estimated body mass index is 39.52 kg/m as calculated from the following:   Height as of this encounter: 5' 10 (1.778 m).   Weight as of this encounter: 275 lb 6.4 oz (124.9 kg).  @WEIGHTCHANGE @  Filed Weights   01/23/24 1136  Weight: 275 lb 6.4 oz (124.9 kg)     Physical Exam   General: No distress. Looks well O2 at rest: no Cane present: no Sitting in wheel chair: no Frail: n Obese: yes Neuro: Alert and Oriented x 3.  GCS 15. Speech normal Psych: Pleasant Resp:  Barrel Chest - no.  Wheeze - no, Crackles - no, No overt respiratory distress CVS: Normal heart sounds. Murmurs - no Ext: Stigmata of Connective Tissue Disease - no HEENT: Normal upper airway. PEERL +. No  post nasal drip        Assessment/     Assessment & Plan Hospital discharge follow-up  Acute hypoxic respiratory failure (HCC)  Elevated IgE level  Interstitial lung abnormality (ILA)  H/O recurrent pneumonia  DOE (dyspnea on exertion)    PLAN Patient Instructions     ICD-10-CM   1. Hospital discharge follow-up  Z09     2. Acute hypoxic respiratory failure (HCC)  J96.01     3. Elevated IgE level  R76.89     4. Interstitial lung abnormality (ILA)  R91.8     5. H/O recurrent pneumonia  Z87.01       Overall there are some uncertainty with what is going on the diagnosis as well something that requires more workup and time as the story might still evolve.  As of now best hypothesis  July 2025 hospitaliszation:  -  RSV and then post VIral BOOP requiring steroids through Oct 2025 Oct 2025: -  hospitalization for HCAP (best evidence) v aspiration - doubt BOOP - possible diastolic dysfunction   Plan  - blood work   - Quanti GOld  - ESR  - BNP  - serology  - hypersentiviety pneumonitis profile  Avoid respiratory illness sick exposure and control your risk for respiratory infection  - be uptodate with all respiratory vaccines  - avoid sick contacts especially in areas of indoor clusters (churches, weddings, funerals, family gatherings, birthdays, planes, malls, indoor areas especially)   - mask in these areas   - discourage sick people from coming in close contact with you  - control acid reflux   Followup  - monitor  -clinical followup in 8 weeks wit APP weeks  - based on followup     FOLLOWUP    Return for  -clinical followup in 8 weeks wit APP weeks.  ( Level 05 visit E&M 2024: Estb >= 40 min  in   visit type: on-site physical face to visit  in total care time and counseling or/and coordination of care by this undersigned MD - Dr Dorethia Cave. This includes one or more of the following on this same day 01/23/2024: pre-charting, chart review, note writing, documentation discussion of test results, diagnostic or treatment recommendations, prognosis, risks and benefits of management options, instructions, education, compliance or risk-factor reduction. It excludes time spent by the CMA or office staff in the care of the patient. Actual time 45 min)   SIGNATURE    Dr. Dorethia Cave, M.D., F.C.C.P,  Pulmonary and Critical Care Medicine Staff Physician, [facility] Director - Interstitial Lung Disease  Program  Pulmonary Fibrosis [facility] at [facility] [place], [place], [address]  Pager: [phone], If no answer or between  15:00h - 7:00h: call [phone] Telephone: [phone]  6:10 PM 01/23/2024

## 2024-01-23 NOTE — Telephone Encounter (Signed)
 I need to see him before end of the y year. CT still shows some opacitiies.

## 2024-01-23 NOTE — Patient Instructions (Addendum)
 ICD-10-CM   1. Hospital discharge follow-up  Z09     2. Acute hypoxic respiratory failure (HCC)  J96.01     3. Elevated IgE level  R76.89     4. Interstitial lung abnormality (ILA)  R91.8     5. H/O recurrent pneumonia  Z87.01       Overall there are some uncertainty with what is going on the diagnosis as well something that requires more workup and time as the story might still evolve.  As of now best hypothesis  July 2025 hospitaliszation:  -  RSV and then post VIral BOOP requiring steroids through Oct 2025 Oct 2025: -  hospitalization for HCAP (best evidence) v aspiration - doubt BOOP - possible diastolic dysfunction   Plan  - blood work   - Quanti GOld  - ESR  - BNP  - serology  - hypersentiviety pneumonitis profile  Avoid respiratory illness sick exposure and control your risk for respiratory infection  - be uptodate with all respiratory vaccines  - avoid sick contacts especially in areas of indoor clusters (churches, weddings, funerals, family gatherings, birthdays, planes, malls, indoor areas especially)   - mask in these areas   - discourage sick people from coming in close contact with you  - control acid reflux   Followup  - monitor  -clinical followup in 8 weeks wit APP weeks  - based on followup

## 2024-01-23 NOTE — Telephone Encounter (Signed)
 Appointment scheduled.

## 2024-01-24 ENCOUNTER — Encounter: Payer: Self-pay | Admitting: Dermatology

## 2024-01-24 ENCOUNTER — Ambulatory Visit: Admitting: Dermatology

## 2024-01-24 VITALS — BP 167/84

## 2024-01-24 DIAGNOSIS — Z86006 Personal history of melanoma in-situ: Secondary | ICD-10-CM | POA: Diagnosis not present

## 2024-01-24 DIAGNOSIS — L905 Scar conditions and fibrosis of skin: Secondary | ICD-10-CM

## 2024-01-24 DIAGNOSIS — C439 Malignant melanoma of skin, unspecified: Secondary | ICD-10-CM

## 2024-01-24 NOTE — Patient Instructions (Signed)

## 2024-01-24 NOTE — Progress Notes (Signed)
   Follow Up Visit   Subjective  Ryan Holland is a 53 y.o. 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  The following portions of the chart were reviewed this encounter and updated as appropriate: medications, allergies, medical history  Review of Systems:  No other skin or systemic complaints except as noted in HPI or Assessment and Plan.  Objective  Well appearing patient in no apparent distress; mood and affect are within normal limits.  A focal examination was performed including scalp, head, face. All findings within normal limits unless otherwise noted below.  Healing wound with mild erythema  Relevant physical exam findings are noted in the Assessment and Plan.     Assessment & Plan    Scar s/p Mohs for MIS on the left preauricular area, treated on 12/26/23, repaired with linear closure - Reassured that wound is healing well - No evidence of infection - No swelling, induration, purulence, dehiscence, or tenderness out of proportion to the clinical exam, see photo above - Discussed that scars take up to 12 months to mature from the date of surgery - Recommend SPF 30+ to scar daily to prevent purple color from UV exposure during scar maturation process - Discussed that erythema and raised appearance of scar will fade over the next 4-6 months - OK to start scar massage at 4-6 weeks post-op - Can consider silicone based products for scar healing starting at 6 weeks post-op  HISTORY OF MELANOMA IN SITU - No evidence of recurrence today - Recommend regular full body skin exams - Recommend daily broad spectrum sunscreen SPF 30+ to sun-exposed areas, reapply every 2 hours as needed.  - Call if any new or changing lesions are noted between office visits   Return in about 3 months  (around 04/25/2024) for TBSE.    Documentation: I have reviewed the above documentation for accuracy and completeness, and I agree with the above.  RUFUS CHRISTELLA HOLY, MD

## 2024-01-25 LAB — QUANTIFERON-TB GOLD PLUS
Mitogen-NIL: 8.26 [IU]/mL
NIL: 0.01 [IU]/mL
QuantiFERON-TB Gold Plus: NEGATIVE
TB1-NIL: 0 [IU]/mL
TB2-NIL: 0 [IU]/mL

## 2024-01-26 LAB — ALLERGEN PROFILE, PERENNIAL ALLERGEN IGE
Alternaria Alternata IgE: <0.1 kU/L
Aspergillus Fumigatus IgE: <0.1 kU/L
Aureobasidi Pullulans IgE: <0.1 kU/L
Candida Albicans IgE: 0.52 kU/L — AB
Cat Dander IgE: 1.28 kU/L — AB
Chicken Feathers IgE: <0.1 kU/L
Cladosporium Herbarum IgE: <0.1 kU/L
Cow Dander IgE: 1.8 kU/L — AB
D Farinae IgE: 0.77 kU/L — AB
D Pteronyssinus IgE: 0.92 kU/L — AB
Dog Dander IgE: 2.78 kU/L — AB
Duck Feathers IgE: <0.1 kU/L
Goose Feathers IgE: <0.1 kU/L
Mouse Urine IgE: <0.1 kU/L
Mucor Racemosus IgE: <0.1 kU/L
Penicillium Chrysogen IgE: <0.1 kU/L
Phoma Betae IgE: <0.1 kU/L
Setomelanomma Rostrat: <0.1 kU/L
Stemphylium Herbarum IgE: <0.1 kU/L

## 2024-01-27 ENCOUNTER — Encounter (HOSPITAL_COMMUNITY): Payer: Self-pay

## 2024-01-28 ENCOUNTER — Other Ambulatory Visit: Payer: Self-pay

## 2024-01-29 ENCOUNTER — Other Ambulatory Visit (HOSPITAL_BASED_OUTPATIENT_CLINIC_OR_DEPARTMENT_OTHER): Payer: Self-pay

## 2024-01-29 ENCOUNTER — Other Ambulatory Visit: Payer: Self-pay

## 2024-01-29 ENCOUNTER — Other Ambulatory Visit (HOSPITAL_COMMUNITY): Payer: Self-pay

## 2024-01-29 LAB — HYPERSENSITIVITY PNUEMONITIS PROFILE
ASPERGILLUS FUMIGATUS: NEGATIVE
Faenia retivirgula: NEGATIVE
Pigeon Serum: NEGATIVE
S. VIRIDIS: NEGATIVE
T. CANDIDUS: NEGATIVE
T. VULGARIS: NEGATIVE

## 2024-01-29 LAB — ANA: Anti Nuclear Antibody (ANA): NEGATIVE

## 2024-01-29 LAB — SJOGRENS SYNDROME-A EXTRACTABLE NUCLEAR ANTIBODY: SSA (Ro) (ENA) Antibody, IgG: <1 AI

## 2024-01-29 LAB — RHEUMATOID FACTOR: Rheumatoid fact SerPl-aCnc: 11 [IU]/mL (ref <14)

## 2024-01-29 LAB — CYCLIC CITRUL PEPTIDE ANTIBODY, IGG: Cyclic Citrullin Peptide Ab: <16 U

## 2024-01-29 LAB — ANCA SCREEN W REFLEX TITER: ANCA SCREEN: NEGATIVE

## 2024-01-29 LAB — SJOGRENS SYNDROME-B EXTRACTABLE NUCLEAR ANTIBODY: SSB (La) (ENA) Antibody, IgG: <1 AI

## 2024-01-29 NOTE — Progress Notes (Signed)
 Specialty Pharmacy Refill Coordination Note  Ryan Holland is a 53 y.o. male contacted today regarding refills of specialty medication(s) Dupilumab  (Dupixent )   Patient requested Delivery   Delivery date: 02/06/24   Verified address: [address] [place] [address]   Medication will be filled on: 02/05/24

## 2024-02-04 ENCOUNTER — Other Ambulatory Visit: Payer: Self-pay

## 2024-02-05 ENCOUNTER — Ambulatory Visit

## 2024-02-05 ENCOUNTER — Encounter: Payer: Self-pay | Admitting: Podiatry

## 2024-02-05 ENCOUNTER — Ambulatory Visit: Admitting: Podiatry

## 2024-02-05 DIAGNOSIS — M898X7 Other specified disorders of bone, ankle and foot: Secondary | ICD-10-CM

## 2024-02-05 DIAGNOSIS — D2371 Other benign neoplasm of skin of right lower limb, including hip: Secondary | ICD-10-CM | POA: Diagnosis not present

## 2024-02-05 NOTE — Progress Notes (Signed)
 Subjective:  Patient ID: Ryan Holland, male    DOB: Jun 24, 1970,  MRN: [id number] HPI Chief Complaint  Patient presents with   Toe Pain    Plantar hallux right - callused area x years, tried trimming, feels better for awhile but comes back, tender walking   New Patient (Initial Visit)    53 y.o. male presents with the above complaint.   ROS: Denies fever chills nausea vomiting muscle aches pains calf pain back pain chest pain shortness of breath.  Past Medical History:  Diagnosis Date   Anxiety    GERD (gastroesophageal reflux disease)    OCCAS-WOULD USE OTC MED IF NEEDED   HNP (herniated nucleus pulposus), lumbar    PAIN IN LOWER BACK AND PAIN DOWN BOTH LEGS -12-24-14 radiates down right leg presently.   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 Corey   Sleep apnea    HAS CPAP-DOES NOT USE-DOES NOT KNOW SETTINGS   Past Surgical History:  Procedure Laterality Date   01/05/2011 LUMBAR DECOMPRESSION AND MICRODISCECTOMY     CERVICAL FUSION  C4-5      ESOPHAGOGASTRODUODENOSCOPY (EGD) WITH PROPOFOL  N/A 12/30/2014   Procedure: ESOPHAGOGASTRODUODENOSCOPY (EGD) WITH PROPOFOL ;  Surgeon: Elsie Cree, MD;  Location: [facility];  Service: Endoscopy;  Laterality: N/A;  With Bravo   LUMBAR LAMINECTOMY/DECOMPRESSION MICRODISCECTOMY  07/26/2011   Procedure: LUMBAR LAMINECTOMY/DECOMPRESSION MICRODISCECTOMY;  Surgeon: [name], MD;  Location: [facility];  Service: Orthopedics;  Laterality: Right;  Re-Do Lumbar Decompression L5-S1 on Right,    PILONIDAL CYST EXCISION      Current Outpatient Medications:    albuterol  (PROAIR  HFA) 108 (90 Base) MCG/ACT inhaler, Inhale 2 puffs into the lungs every 4 (four) hours as needed for wheezing or shortness of breath., Disp: 18 g, Rfl: 5   benzonatate  (TESSALON ) 100 MG capsule, Take 1 capsule (100 mg total) by mouth 3 (three) times daily as needed for cough., Disp: 30 capsule, Rfl: 0    cholecalciferol (VITAMIN D3) 25 MCG (1000 UNIT) tablet, Take 1,000 Units by mouth as directed., Disp: , Rfl:    citalopram  (CELEXA ) 40 MG tablet, Take 1 tablet (40 mg total) by mouth daily., Disp: 90 tablet, Rfl: 0   clonazePAM  (KLONOPIN ) 0.5 MG tablet, Take 1 tablet (0.5 mg total) by mouth at bedtime as needed anxiety and insomnia, Disp: 10 tablet, Rfl: 0   diphenhydrAMINE (BENADRYL) 25 mg capsule, Take 50 mg by mouth in the morning., Disp: , Rfl:    Dupilumab  (DUPIXENT ) 300 MG/2ML SOAJ, Inject 300 mg into the skin every 14 (fourteen) days., Disp: 12 mL, Rfl: 1   Evolocumab  (REPATHA  SURECLICK) 140 MG/ML SOAJ, Inject 140 mg into the skin every 14 (fourteen) days., Disp: 6 mL, Rfl: 1   fluticasone  (FLONASE ) 50 MCG/ACT nasal spray, Place 2 sprays into both nostrils daily., Disp: 16 g, Rfl: 0   fluticasone  furoate-vilanterol (BREO ELLIPTA ) 200-25 MCG/ACT AEPB, Inhale 1 puff into the lungs daily., Disp: 60 each, Rfl: 5   methocarbamol  (ROBAXIN ) 500 MG tablet, Take 1 tablet by mouth every  8 hours as needed for muscle spams., Disp: 30 tablet, Rfl: 0   montelukast  (SINGULAIR ) 10 MG tablet, Take 1 tablet (10 mg total) by mouth daily., Disp: 90 tablet, Rfl: 3   pantoprazole  (PROTONIX ) 40 MG tablet, Take 1 tablet (40 mg total) by mouth daily., Disp: 90 tablet, Rfl: 3  Allergies  Allergen Reactions   Other     LEMON-sores in  mouth   Review of Systems Objective:  There were no vitals filed for this visit.  General: Well developed, nourished, in no acute distress, alert and oriented x3   Dermatological: Skin is warm, dry and supple bilateral. Nails x 10 are well maintained; remaining integument appears unremarkable at this time. There are no open sores, no preulcerative lesions, no rash or signs of infection present.  Reactive benign skin neoplasm plantar aspect of the hallux interphalangeal joint of the right foot.  Vascular: Dorsalis Pedis artery and Posterior Tibial artery pedal pulses are 2/4  bilateral with immedate capillary fill time. Pedal hair growth present. No varicosities and no lower extremity edema present bilateral.   Neruologic: Grossly intact via light touch bilateral. Vibratory intact via tuning fork bilateral. Protective threshold with Semmes Wienstein monofilament intact to all pedal sites bilateral. Patellar and Achilles deep tendon reflexes 2+ bilateral. No Babinski or clonus noted bilateral.   Musculoskeletal: No gross boney pedal deformities bilateral. No pain, crepitus, or limitation noted with foot and ankle range of motion bilateral. Muscular strength 5/5 in all groups tested bilateral.  Hyperextension of the hallux interphalangeal joint compensating for limited range of motion of the first metatarsophalangeal joint right  Gait: Unassisted, Nonantalgic.    Radiographs:  Radiographs taken demonstrate an osseously mature individual with mild joint changes at the metatarsal phalangeal joint and the interphalangeal joint of the hallux.  No acute findings otherwise noted.  Assessment & Plan:   Assessment: Hallux limitus first metatarsophalangeal joint right with hallux extensors at the level of the interphalangeal joint.  This is resulting in painful benign skin lesion plantar aspect.  Plan: Debrided benign skin lesion placed padding discussed appropriate shoe gear and discussed the possible need for hallux interphalangeal joint fusion.     Ryan Holland, [place]

## 2024-02-13 ENCOUNTER — Telehealth: Payer: Self-pay | Admitting: Pharmacist

## 2024-02-13 NOTE — Telephone Encounter (Signed)
 Called patient to schedule an appointment for the [facility] Special[facility]ication [facility]. I was unable to reach the patient so I left a HIPAA-compliant message requesting that the patient return my call.   Ryan Holland, PharmD, JAQUELINE, CPP Clinical Pharmacist [facility] & [facility] [phone]

## 2024-02-24 ENCOUNTER — Other Ambulatory Visit: Payer: Self-pay | Admitting: Medical

## 2024-02-24 ENCOUNTER — Other Ambulatory Visit: Payer: Self-pay | Admitting: Pharmacist

## 2024-02-24 DIAGNOSIS — F339 Major depressive disorder, recurrent, unspecified: Secondary | ICD-10-CM

## 2024-02-24 DIAGNOSIS — G8929 Other chronic pain: Secondary | ICD-10-CM

## 2024-02-25 ENCOUNTER — Other Ambulatory Visit (HOSPITAL_COMMUNITY): Payer: Self-pay

## 2024-02-25 MED ORDER — METHOCARBAMOL 500 MG PO TABS
500.0000 mg | ORAL_TABLET | Freq: Three times a day (TID) | ORAL | 0 refills | Status: DC | PRN
  Filled 2024-02-25: qty 10, 4d supply, fill #0

## 2024-02-25 MED ORDER — CITALOPRAM HYDROBROMIDE 40 MG PO TABS
40.0000 mg | ORAL_TABLET | Freq: Every day | ORAL | 0 refills | Status: AC
  Filled 2024-02-25: qty 90, 90d supply, fill #0

## 2024-02-25 NOTE — Telephone Encounter (Signed)
 Called pt got him scheduled for 03/14/2024

## 2024-02-25 NOTE — Addendum Note (Signed)
 Addended by: [name] HERO on: 02/25/2024 08:43 PM   Modules accepted: Orders

## 2024-02-25 NOTE — Telephone Encounter (Signed)
 Have pt make appointment early January to discuss low back pain and assess ongoing need for monthly muscle relaxants.

## 2024-02-26 ENCOUNTER — Other Ambulatory Visit: Payer: Self-pay

## 2024-02-26 ENCOUNTER — Other Ambulatory Visit (HOSPITAL_COMMUNITY): Payer: Self-pay

## 2024-02-27 ENCOUNTER — Other Ambulatory Visit (HOSPITAL_BASED_OUTPATIENT_CLINIC_OR_DEPARTMENT_OTHER): Payer: Self-pay

## 2024-02-27 ENCOUNTER — Ambulatory Visit: Payer: Self-pay | Admitting: Internal Medicine

## 2024-02-27 NOTE — Progress Notes (Signed)
 Patient is overdue for a follow-up. Please call and schedule an appointment to discuss results. His allergy test is positive. Saw him Mid Nov - . Needs followup Mid Jan but if not by Mid fevb 026 with APP or MR

## 2024-03-03 ENCOUNTER — Other Ambulatory Visit: Payer: Self-pay

## 2024-03-03 NOTE — Progress Notes (Signed)
 Patient scheduled.

## 2024-03-11 ENCOUNTER — Other Ambulatory Visit: Payer: Self-pay

## 2024-03-12 ENCOUNTER — Other Ambulatory Visit: Payer: Self-pay | Admitting: Medical Genetics

## 2024-03-12 ENCOUNTER — Other Ambulatory Visit: Payer: Self-pay

## 2024-03-13 ENCOUNTER — Other Ambulatory Visit: Payer: Self-pay

## 2024-03-13 ENCOUNTER — Other Ambulatory Visit (HOSPITAL_COMMUNITY): Payer: Self-pay

## 2024-03-13 ENCOUNTER — Encounter: Payer: Self-pay | Admitting: Pharmacist

## 2024-03-13 ENCOUNTER — Other Ambulatory Visit: Payer: Self-pay | Admitting: Internal Medicine

## 2024-03-13 ENCOUNTER — Ambulatory Visit: Attending: Medical | Admitting: Pharmacist

## 2024-03-13 ENCOUNTER — Telehealth: Payer: Self-pay

## 2024-03-13 DIAGNOSIS — J4550 Severe persistent asthma, uncomplicated: Secondary | ICD-10-CM

## 2024-03-13 DIAGNOSIS — D721 Eosinophilia, unspecified: Secondary | ICD-10-CM

## 2024-03-13 DIAGNOSIS — Z79899 Other long term (current) drug therapy: Secondary | ICD-10-CM

## 2024-03-13 MED ORDER — DUPIXENT 300 MG/2ML ~~LOC~~ SOAJ
300.0000 mg | SUBCUTANEOUS | 1 refills | Status: DC
  Filled 2024-03-13: qty 12, 84d supply, fill #0

## 2024-03-13 MED ORDER — DUPIXENT 300 MG/2ML ~~LOC~~ SOAJ
300.0000 mg | SUBCUTANEOUS | 1 refills | Status: DC
  Filled 2024-03-13 – 2024-03-21 (×3): qty 12, 84d supply, fill #0

## 2024-03-13 NOTE — Telephone Encounter (Signed)
 Received notification via specialty pharmacy encounter that pt has new insurance and needs pa for Dupixent . Submitted a Prior Authorization request to [facility] for DUPIXENT  via CoverMyMeds. Will update once we receive a response.  Key: [id number]

## 2024-03-13 NOTE — Progress Notes (Signed)
" ° °  S: Patient presents for review of their specialty medication therapy.  Patient is currently taking Dupixent  for asthma. Patient is managed by Dr. Geronimo for this.   Adherence: confirms  Efficacy: reports that the medication works well for him  Dosing: 300 mg subcutaneous once every 14 days.   Dose adjustments: Renal: no dose adjustments (has not been studied) Hepatic: no dose adjustments (has not been studied)  Drug-drug interactions:none noted   Monitoring: S/sx of infection: none S/sx of hypersensitivity: none S/sx of ocular effects: none S/sx of eosinophilia/vasculitis: none  O:     Lab Results  Component Value Date   WBC 8.1 01/23/2024   HGB 14.9 01/23/2024   HCT 42.9 01/23/2024   MCV 97.2 01/23/2024   PLT 197.0 01/23/2024      Chemistry      Component Value Date/Time   NA 140 12/25/2023 0941   NA 140 01/03/2023 1615   K 4.1 12/25/2023 0941   CL 102 12/25/2023 0941   CO2 29 12/25/2023 0941   BUN 15 12/25/2023 0941   BUN 11 01/03/2023 1615   CREATININE 0.68 12/25/2023 0941   CREATININE 0.67 (L) 10/09/2023 0903      Component Value Date/Time   CALCIUM  9.6 12/25/2023 0941   ALKPHOS 179 (H) 12/25/2023 0941   AST 53 (H) 12/25/2023 0941   ALT 35 12/25/2023 0941   BILITOT 1.1 12/25/2023 0941   BILITOT 1.9 (H) 03/14/2023 0926       A/P: 1. Medication review: Patient currently on Dupixent  for asthma. Reviewed the medication with the patient, including the following: Dupixent  is a monoclonal antibody used for the treatment of asthma or atopic dermatitis. Patient educated on purpose, proper use and potential adverse effects of Dupixent . Possible adverse effects include increased risk of infection, ocular effects, vasculitis/eosinophilia, and hypersensitivity reactions. Administer as a SubQ injection and rotate sites. Allow the medication to reach room temp prior to administration (45 mins for 300 mg syringe or 30 min for 200 mg syringe). Do not shake.  Discard any unused portion. No recommendations for any changes.    Herlene Fleeta Morris, PharmD, JAQUELINE, CPP Clinical Pharmacist [facility] & [facility] [phone]  "

## 2024-03-14 ENCOUNTER — Other Ambulatory Visit: Payer: Self-pay

## 2024-03-14 ENCOUNTER — Other Ambulatory Visit (HOSPITAL_COMMUNITY): Payer: Self-pay

## 2024-03-14 ENCOUNTER — Ambulatory Visit: Admitting: Medical

## 2024-03-17 ENCOUNTER — Encounter: Payer: Self-pay | Admitting: Medical

## 2024-03-17 ENCOUNTER — Ambulatory Visit: Admitting: Medical

## 2024-03-17 ENCOUNTER — Other Ambulatory Visit (HOSPITAL_BASED_OUTPATIENT_CLINIC_OR_DEPARTMENT_OTHER): Payer: Self-pay

## 2024-03-17 ENCOUNTER — Other Ambulatory Visit: Payer: Self-pay

## 2024-03-17 ENCOUNTER — Ambulatory Visit: Payer: Self-pay | Admitting: Medical

## 2024-03-17 VITALS — BP 160/90 | HR 92 | Temp 98.1°F | Resp 16 | Ht 70.0 in | Wt 279.6 lb

## 2024-03-17 DIAGNOSIS — E785 Hyperlipidemia, unspecified: Secondary | ICD-10-CM | POA: Diagnosis not present

## 2024-03-17 DIAGNOSIS — R748 Abnormal levels of other serum enzymes: Secondary | ICD-10-CM | POA: Diagnosis not present

## 2024-03-17 DIAGNOSIS — J4540 Moderate persistent asthma, uncomplicated: Secondary | ICD-10-CM

## 2024-03-17 DIAGNOSIS — M5442 Lumbago with sciatica, left side: Secondary | ICD-10-CM

## 2024-03-17 DIAGNOSIS — G8929 Other chronic pain: Secondary | ICD-10-CM

## 2024-03-17 DIAGNOSIS — Z0184 Encounter for antibody response examination: Secondary | ICD-10-CM

## 2024-03-17 DIAGNOSIS — F1010 Alcohol abuse, uncomplicated: Secondary | ICD-10-CM

## 2024-03-17 DIAGNOSIS — Z1159 Encounter for screening for other viral diseases: Secondary | ICD-10-CM | POA: Diagnosis not present

## 2024-03-17 DIAGNOSIS — J849 Interstitial pulmonary disease, unspecified: Secondary | ICD-10-CM

## 2024-03-17 LAB — COMPREHENSIVE METABOLIC PANEL WITH GFR
ALT: 33 U/L (ref 3–53)
AST: 67 U/L — ABNORMAL HIGH (ref 5–37)
Albumin: 4 g/dL (ref 3.5–5.2)
Alkaline Phosphatase: 136 U/L — ABNORMAL HIGH (ref 39–117)
BUN: 12 mg/dL (ref 6–23)
CO2: 29 meq/L (ref 19–32)
Calcium: 9.5 mg/dL (ref 8.4–10.5)
Chloride: 100 meq/L (ref 96–112)
Creatinine, Ser: 0.78 mg/dL (ref 0.40–1.50)
GFR: 102.97 mL/min
Glucose, Bld: 104 mg/dL — ABNORMAL HIGH (ref 70–99)
Potassium: 4.1 meq/L (ref 3.5–5.1)
Sodium: 138 meq/L (ref 135–145)
Total Bilirubin: 1.2 mg/dL (ref 0.2–1.2)
Total Protein: 7.8 g/dL (ref 6.0–8.3)

## 2024-03-17 LAB — LIPID PANEL
Cholesterol: 221 mg/dL — ABNORMAL HIGH (ref 28–200)
HDL: 99.6 mg/dL
LDL Cholesterol: 104 mg/dL — ABNORMAL HIGH (ref 10–99)
NonHDL: 121.7
Total CHOL/HDL Ratio: 2
Triglycerides: 87 mg/dL (ref 10.0–149.0)
VLDL: 17.4 mg/dL (ref 0.0–40.0)

## 2024-03-17 MED ORDER — ALBUTEROL SULFATE HFA 108 (90 BASE) MCG/ACT IN AERS
2.0000 | INHALATION_SPRAY | RESPIRATORY_TRACT | 5 refills | Status: AC | PRN
  Filled 2024-03-17: qty 6.7, 30d supply, fill #0

## 2024-03-17 MED ORDER — ACAMPROSATE CALCIUM 333 MG PO TBEC
666.0000 mg | DELAYED_RELEASE_TABLET | Freq: Three times a day (TID) | ORAL | 0 refills | Status: AC
  Filled 2024-03-17: qty 180, 30d supply, fill #0

## 2024-03-17 MED ORDER — METHOCARBAMOL 500 MG PO TABS
500.0000 mg | ORAL_TABLET | Freq: Three times a day (TID) | ORAL | 2 refills | Status: AC | PRN
  Filled 2024-03-17: qty 30, 10d supply, fill #0

## 2024-03-17 MED ORDER — VALSARTAN 80 MG PO TABS
80.0000 mg | ORAL_TABLET | Freq: Every day | ORAL | 0 refills | Status: DC
  Filled 2024-03-17: qty 30, 30d supply, fill #0

## 2024-03-17 NOTE — Patient Instructions (Signed)
 Hypertension Blood pressure elevated, requiring antihypertensive therapy. Valsartan  chosen. - valsartan   for blood pressure control. - Scheduled nurse visit in 7-10 days for blood pressure check on valsartan .  Interstitial lung disease Managed by pulmonologist. Recent CT showed interstitial lung abnormalities. Advised to avoid sick contacts to prevent exacerbations. - Continue follow-up with pulmonologist Dr. Geronimo. - Avoid sick contacts, especially in indoor areas.  Moderate persistent asthma Managed by Dr. Geronimo. On Dupixent  with adequate supply. - Continue Dupixent  as prescribed by Dr. Geronimo. -also rx albuterol  refilled today.  Hyperlipidemia Previously managed with Repatha  due to increased lft. Lipid panel last checked five months ago. - Ordered fasting lipid panel. - Follow up with cardiologist for lipid management.  Alcohol  abuse Recent relapse with weekend consumption. Acamprosate  rx. - Prescribed acamprosate  to reduce alcohol  cravings. - Instructed to check if acamprosate  prescription was previously filled.  Elevated liver enzymes Likely secondary to alcohol  use and statin intolerance. Monitoring required. - Ordered metabolic panel to check liver enzymes and kidney function.  General Health Maintenance Hepatitis B vaccination status unclear. Immunity may have waned. Hepatitis B and C antibody tests planned. COVID vaccine discussed. - Ordered hepatitis B surface antibody test. - Ordered hepatitis C antibody test. - Discussed COVID vaccine.  Follow up 7-10 days bp check with nurse or sooner if needed

## 2024-03-17 NOTE — Progress Notes (Signed)
 "  Subjective:    Patient ID: Ryan Holland, male    DOB: [birth year], 54 y.o.   MRN: [id number]  HPI   Ryan Holland is a 54 year old male who presents for follow-up and evaluation of his vaccination status and blood pressure management.  He has history of on interstitial lung abnormalities from a prior CT during hospitalization for respiratory failure. He is on Dupixent  for persistent asthma. He has received the influenza vaccine. Needs refill of albuterol .  He has elevated liver enzymes and ongoing alcohol  use. He recently resumed drinking three to four drinks on weekends after three months of abstinence.   His blood pressure has been fluctuating with recent elevated readings. Losartan  was stopped after a hospital visit. He monitors blood pressure at home and  resting pulse in office   high 80s to low 90s without palpitations or skipped beats.  He is uncertain about prior hepatitis B vaccination. He has received shingles and influenza vaccines and is considering the COVID vaccine. He is also due for a hepatitis C antibody test.  He has hyperlipidemia managed with Repatha  due to prior statin intolerance with elevated liver enzymes. His last lipid panel was about five months ago.    Review of Systems  Constitutional:  Negative for chills, fatigue and fever.  HENT:  Negative for congestion and ear pain.   Respiratory:  Negative for chest tightness, shortness of breath and wheezing.   Cardiovascular:  Negative for chest pain and palpitations.  Gastrointestinal:  Negative for abdominal pain and constipation.  Genitourinary:  Negative for dysuria and frequency.  Musculoskeletal:  Negative for back pain and myalgias.  Neurological:  Negative for dizziness, syncope, facial asymmetry, weakness, numbness and headaches.  Hematological:  Negative for adenopathy.  Psychiatric/Behavioral:  Negative for behavioral problems, decreased concentration and dysphoric mood.    Past Medical  History:  Diagnosis Date   Anxiety    GERD (gastroesophageal reflux disease)    OCCAS-WOULD USE OTC MED IF NEEDED   HNP (herniated nucleus pulposus), lumbar    PAIN IN LOWER BACK AND PAIN DOWN BOTH LEGS -12-24-14 radiates down right leg presently.   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 Corey   Sleep apnea    HAS CPAP-DOES NOT USE-DOES NOT KNOW SETTINGS     Social History   Socioeconomic History   Marital status: Married    Spouse name: Not on file   Number of children: Not on file   Years of education: Not on file   Highest education level: Not on file  Occupational History   Not on file  Tobacco Use   Smoking status: Former    Current packs/day: 0.00    Average packs/day: 2.0 packs/day for 20.0 years (40.0 ttl pk-yrs)    Types: Cigarettes    Start date: 10/05/1991    Quit date: 10/05/2011    Years since quitting: 12.4   Smokeless tobacco: Former    Types: Chew  Substance and Sexual Activity   Alcohol  use: Yes    Alcohol /week: 0.0 standard drinks of alcohol     Comment: 2 MIXED DRINKS (VODKA) DAILY-usually    Drug use: No   Sexual activity: Not on file  Other Topics Concern   Not on file  Social History Narrative   Not on file   Social Drivers of Health   Tobacco Use: Medium Risk (03/17/2024)   Patient History    Smoking Tobacco Use:  Former    Smokeless Tobacco Use: Former    Passive Exposure: Not on Stage Manager: Not on file  Food Insecurity: No Food Insecurity (12/15/2023)   Epic    Worried About Programme Researcher, Broadcasting/film/video in the Last Year: Never true    [employer] of Food in the Last Year: Never true  Transportation Needs: No Transportation Needs (12/15/2023)   Epic    Lack of Transportation (Medical): No    Lack of Transportation (Non-Medical): No  Physical Activity: Not on file  Stress: Not on file  Social Connections: Moderately Isolated (09/24/2023)   Social Connection and Isolation  Panel    Frequency of Communication with Friends and Family: Twice a week    Frequency of Social Gatherings with Friends and Family: Once a week    Attends Religious Services: Never    Database Administrator or Organizations: No    Attends Banker Meetings: Never    Marital Status: Married  Catering Manager Violence: Not At Risk (12/15/2023)   Epic    Fear of Current or Ex-Partner: No    Emotionally Abused: No    Physically Abused: No    Sexually Abused: No  Depression (PHQ2-9): Low Risk (03/17/2024)   Depression (PHQ2-9)    PHQ-2 Score: 3  Alcohol  Screen: Not on file  Housing: Low Risk (12/15/2023)   Epic    Unable to Pay for Housing in the Last Year: No    Number of Times Moved in the Last Year: 0    Homeless in the Last Year: No  Utilities: Not At Risk (12/15/2023)   Epic    Threatened with loss of utilities: No  Health Literacy: Not on file    Past Surgical History:  Procedure Laterality Date   01/05/2011 LUMBAR DECOMPRESSION AND MICRODISCECTOMY     CERVICAL FUSION  C4-5      ESOPHAGOGASTRODUODENOSCOPY (EGD) WITH PROPOFOL  N/A 12/30/2014   Procedure: ESOPHAGOGASTRODUODENOSCOPY (EGD) WITH PROPOFOL ;  Surgeon: Elsie Cree, MD;  Location: [facility];  Service: Endoscopy;  Laterality: N/A;  With Bravo   LUMBAR LAMINECTOMY/DECOMPRESSION MICRODISCECTOMY  07/26/2011   Procedure: LUMBAR LAMINECTOMY/DECOMPRESSION MICRODISCECTOMY;  Surgeon: [name], MD;  Location: [facility];  Service: Orthopedics;  Laterality: Right;  Re-Do Lumbar Decompression L5-S1 on Right,    PILONIDAL CYST EXCISION      Family History  Problem Relation Age of Onset   COPD Father    Congestive Heart Failure Father    Heart disease Father        died of  congenial heart diseases   Hypertension Father    Breast cancer Mother    Hypertension Mother     Allergies[1]  Medications Ordered Prior to Encounter[2]  BP (!) 160/90   Pulse 92   Temp 98.1 F (36.7 C) (Oral)   Resp 16   Ht  5' 10 (1.778 m)   Wt 279 lb 9.6 oz (126.8 kg)   SpO2 95%   BMI 40.12 kg/m        Objective:   Physical Exam  General Mental Status- Alert. General Appearance- Not in acute distress.   Skin General: Color- Normal Color. Moisture- Normal Moisture.  Neck No JVD.  Chest and Lung Exam Auscultation: Breath Sounds:-CTA  Cardiovascular Auscultation:Rythm- RRR Murmurs & Other Heart Sounds:Auscultation of the heart reveals- No Murmurs.  Abdomen Inspection:-Inspeection Normal. Palpation/Percussion:Note:No mass. Palpation and Percussion of the abdomen reveal- Non Tender, Non Distended + BS, no rebound or guarding.  Neurologic Cranial Nerve exam:- CN III-XII intact(No nystagmus), symmetric smile. Strength:- 5/5 equal and symmetric strength both upper and lower extremities.       Assessment & Plan:   Patient Instructions  Hypertension Blood pressure elevated, requiring antihypertensive therapy. Valsartan  chosen. - valsartan   for blood pressure control. - Scheduled nurse visit in 7-10 days for blood pressure check on valsartan .  Interstitial lung disease Managed by pulmonologist. Recent CT showed interstitial lung abnormalities. Advised to avoid sick contacts to prevent exacerbations. - Continue follow-up with pulmonologist Dr. Geronimo. - Avoid sick contacts, especially in indoor areas.  Moderate persistent asthma Managed by Dr. Geronimo. On Dupixent  with adequate supply. - Continue Dupixent  as prescribed by Dr. Geronimo. -also rx albuterol  refilled today.  Hyperlipidemia Previously managed with Repatha  due to increased lft. Lipid panel last checked five months ago. - Ordered fasting lipid panel. - Follow up with cardiologist for lipid management.  Alcohol  abuse Recent relapse with weekend consumption. Acamprosate  rx. - Prescribed acamprosate  to reduce alcohol  cravings. - Instructed to check if acamprosate  prescription was previously filled.  Elevated liver  enzymes Likely secondary to alcohol  use and statin intolerance. Monitoring required. - Ordered metabolic panel to check liver enzymes and kidney function.  General Health Maintenance Hepatitis B vaccination status unclear. Immunity may have waned. Hepatitis B and C antibody tests planned. COVID vaccine discussed. - Ordered hepatitis B surface antibody test. - Ordered hepatitis C antibody test. - Discussed COVID vaccine.  Follow up 7-10 days bp check with nurse or sooner if needed    I personally spent a total of 46 minutes in the care of the patient today including performing a medically appropriate exam/evaluation, counseling and educating, referring and communicating with other health care professionals, and documenting clinical information in the EHR.     [1]  Allergies Allergen Reactions   Other     LEMON-sores in mouth  [2]  Current Outpatient Medications on File Prior to Visit  Medication Sig Dispense Refill   albuterol  (PROAIR  HFA) 108 (90 Base) MCG/ACT inhaler Inhale 2 puffs into the lungs every 4 (four) hours as needed for wheezing or shortness of breath. 18 g 5   benzonatate  (TESSALON ) 100 MG capsule Take 1 capsule (100 mg total) by mouth 3 (three) times daily as needed for cough. 30 capsule 0   cholecalciferol (VITAMIN D3) 25 MCG (1000 UNIT) tablet Take 1,000 Units by mouth as directed.     citalopram  (CELEXA ) 40 MG tablet Take 1 tablet (40 mg total) by mouth daily. 90 tablet 0   clonazePAM  (KLONOPIN ) 0.5 MG tablet Take 1 tablet (0.5 mg total) by mouth at bedtime as needed anxiety and insomnia 10 tablet 0   diphenhydrAMINE (BENADRYL) 25 mg capsule Take 50 mg by mouth in the morning.     Dupilumab  (DUPIXENT ) 300 MG/2ML SOAJ Inject 300 mg into the skin every 14 (fourteen) days. 12 mL 1   Evolocumab  (REPATHA  SURECLICK) 140 MG/ML SOAJ Inject 140 mg into the skin every 14 (fourteen) days. 6 mL 1   fluticasone  (FLONASE ) 50 MCG/ACT nasal spray Place 2 sprays into both nostrils  daily. 16 g 0   fluticasone  furoate-vilanterol (BREO ELLIPTA ) 200-25 MCG/ACT AEPB Inhale 1 puff into the lungs daily. 60 each 5   montelukast  (SINGULAIR ) 10 MG tablet Take 1 tablet (10 mg total) by mouth daily. 90 tablet 3   pantoprazole  (PROTONIX ) 40 MG tablet Take 1 tablet (40 mg total) by mouth daily. 90 tablet 3   No current facility-administered medications on  file prior to visit.   "

## 2024-03-18 ENCOUNTER — Other Ambulatory Visit: Payer: Self-pay

## 2024-03-18 LAB — HEPATITIS C ANTIBODY: Hepatitis C Ab: NONREACTIVE

## 2024-03-18 LAB — HEPATITIS B SURFACE ANTIBODY, QUANTITATIVE: Hep B S AB Quant (Post): <5 m[IU]/mL — ABNORMAL LOW

## 2024-03-18 NOTE — Telephone Encounter (Signed)
 Received a fax regarding Prior Authorization from [facility] for DUPIXENT . Authorization has been DENIED because Dupixent  is excluded from coverage under the [facility] pharmacy benefit.  Phone# [phone]  Messaged pt's wife Lissa and advised her of denial. Advised we can try to do PAP but she does not believe they will be eligible due to income. She inquired if Xolair could be covered under plan. Does not appear Xolair is a plan exclusion. Submitted a Prior Authorization request to [facility] for XOLAIR via CoverMyMeds. Will update once we receive a response.  Key: [id number]

## 2024-03-19 NOTE — Progress Notes (Signed)
 Pt called and aware. Added note to a nv he already has on the 23rd for them to administer hep b vaccine as well

## 2024-03-21 ENCOUNTER — Other Ambulatory Visit: Payer: Self-pay

## 2024-03-24 ENCOUNTER — Other Ambulatory Visit (HOSPITAL_COMMUNITY): Payer: Self-pay

## 2024-03-24 NOTE — Telephone Encounter (Signed)
 Submitted a Prior Authorization request to [facility] for XOLAIR via CoverMyMeds. Will update once we receive a response.  Key: [id number]

## 2024-03-24 NOTE — Telephone Encounter (Signed)
 Received a fax regarding Prior Authorization from [facility] for XOLAIR. Authorization has been DENIED because:  For diagnosis of Asthma - Member has a baseline immunoglobulin E (IgE) level greater than or equal to 30 IU/mL (prior to treatment with Xolair or any anti-interleukin therapy that may lower IgE levels [Note: Examples of anti-interleukin therapies include Dupixent  (dupilumab ), Nucala (mepolizumab injection for subcutaneous use), Cinqair (reslizumab injection for intravenous use), and Fasenra (benralizumab injection for subcutaneous use)]) - The member's current weight (within the last 30 days)  - The member is not receiving any medication (e.g., Beta Blockers or NSAIDs) that could cause bronchospasm or cause an asthma exacerbation and if the patient is on a potential asthma-inducing medication, that there is a documented medical reason for continuing that medication as well as documentation that the medication is not a cause for worsening asthma or causing any asthma symptoms  - The requested dose, based on IgE level and weight, falls within the recommended dosing guidelines from the manufacturer .  Phone# [phone]  Pt should meet all criteria, will resubmit pa with missing info

## 2024-03-27 ENCOUNTER — Encounter: Payer: Self-pay | Admitting: Internal Medicine

## 2024-03-27 ENCOUNTER — Telehealth: Payer: Self-pay | Admitting: Internal Medicine

## 2024-03-27 ENCOUNTER — Ambulatory Visit (INDEPENDENT_AMBULATORY_CARE_PROVIDER_SITE_OTHER): Admitting: Internal Medicine

## 2024-03-27 VITALS — BP 108/60 | HR 96 | Ht 70.0 in | Wt 282.0 lb

## 2024-03-27 DIAGNOSIS — J45909 Unspecified asthma, uncomplicated: Secondary | ICD-10-CM | POA: Diagnosis not present

## 2024-03-27 DIAGNOSIS — Z8701 Personal history of pneumonia (recurrent): Secondary | ICD-10-CM | POA: Diagnosis not present

## 2024-03-27 DIAGNOSIS — J4550 Severe persistent asthma, uncomplicated: Secondary | ICD-10-CM | POA: Diagnosis not present

## 2024-03-27 DIAGNOSIS — Z9109 Other allergy status, other than to drugs and biological substances: Secondary | ICD-10-CM

## 2024-03-27 DIAGNOSIS — Z87891 Personal history of nicotine dependence: Secondary | ICD-10-CM | POA: Diagnosis not present

## 2024-03-27 DIAGNOSIS — Z8709 Personal history of other diseases of the respiratory system: Secondary | ICD-10-CM | POA: Diagnosis not present

## 2024-03-27 DIAGNOSIS — D721 Eosinophilia, unspecified: Secondary | ICD-10-CM | POA: Diagnosis not present

## 2024-03-27 NOTE — Progress Notes (Signed)
 "    OV 11/20/2022  Subjective:-History from the patient and also review of the external medical record.  Patient ID: Ryan Holland, male , DOB: 08/07/1970 , age 54 y.o. , MRN: [id number] , ADDRESS: [address] [place] [id number] PCP Saguier, [place] RIGGERS Patient Care Team: Saguier, Edward, [name] as PCP - General (Internal Medicine)  This Provider for this visit: Treatment Team:  Attending Provider: Geronimo Amel, MD    11/20/2022 -   Chief Complaint  Patient presents with   Pulmonary Consult    Referred by [place] Maxwell, PA. Pt states have had allergies forever- increased SOB since July 2024- gets winded sometimes just at rest and with minimal exertion. He has had cough- occ prod with clear sputum.      HPI Ryan Holland 54 y.o. -works in [employer].  Following 40 pack smoking history but quit.  He tells me that he has a history of chronic cough for many years.  This is diagnosed secondary to allergies.  He used to follow with [facility].  Review extramedical records I am not able to find any allergy testing but he states allergy testing was positive.  It appears several years ago he had autoimmune profile the results are not available but I am presuming it is negative.  He was never on any asthma inhalers.  Since July 2024 he had insidious onset of shortness of breath associated with dizziness.  Dizziness happens randomly.  Shortness of breath is present with exertion relieved by rest.  After he went on Trelegy the shortness of breath did get better.  His baseline cough started going up.  Now for the last 1 or 2 weeks he has a dull pain in his right breast area.  He also has noticed some random tingling once or twice on his right arm.  The symptoms are associated with tachycardia particularly the dizziness.  He says even at rest he has tachycardia.  Sometimes the heart rate even at rest is 100-120 [90-99/min today at rest].   Review of the records  indicate that as of June 2024 he was doing okay.  Then on October 26, 2022 he did see primary care office.  For episodes of vertigo.  This is what he says the dizziness.  He points to the sinus area for this.  He was also having hypertension.  He did report his chronic cough.  At this visit he said the [facility] was not helping but today he did tell me the [facility] did help.  This subsequently resulted in a CT angiogram chest to rule out pulmonary embolism but did show a right sided groundglass opacities that I personally visualized and confirmed.  He also had Doppler lower extremities and this was negative.  He was given antibiotics both azithromycin  and Z-Pak.  Then on Labor Day weekend he was in the bass outlet at [place].  There when he went outside he got very tachycardic to a heart rate of 145 and also had blurred vision.  He did fall follow-up after this with primary care.  He is due dimer and BNP were normal.  Labs indicate that today exam nitric oxide  is normal [possible Breo effect].  His blood eosinophils are consistently high at 400 cells per cubic millimeter.  Primary care is considered but getting a referral to cardiology for Zio patch.  But he is also been referred here.  CT chest in 2016 was normal.  No ACE inhibitor intake.    X-rays  hypoxemia test sit/stand pulse ox at rest was 99% with a heart rate 90-99.  After sitting and standing 10 times he was short of breath 5 out of 10 pulse ox never changed heart rate went up to 110.  He does have sleep apnea he uses his CPAP but has not seen a sleep doctor in many years.   CT Chest data from date: 10/27/22  - personally visualized and independently interpreted : YES - my findings are: AGREE rrative & Impression  CLINICAL DATA:  Shortness of breath with positive D-dimer   EXAM: CT ANGIOGRAPHY CHEST WITH CONTRAST   TECHNIQUE: Multidetector CT imaging of the chest was performed using the standard protocol during bolus administration of  intravenous contrast. Multiplanar CT image reconstructions and MIPs were obtained to evaluate the vascular anatomy.   RADIATION DOSE REDUCTION: This exam was performed according to the departmental dose-optimization program which includes automated exposure control, adjustment of the mA and/or kV according to patient size and/or use of iterative reconstruction technique.   CONTRAST:  100mL OMNIPAQUE  IOHEXOL  350 MG/ML SOLN   COMPARISON:  None Available.   FINDINGS: Cardiovascular: No evidence of pulmonary embolus. Normal heart size. No pericardial effusion. Normal caliber thoracic aorta with mild atherosclerotic disease. No coronary artery calcifications.   Mediastinum/Nodes: Esophagus thyroid  are unremarkable. No enlarged lymph nodes seen in the chest.   Lungs/Pleura: Central airways are patent. Patchy ground-glass opacities which are most pronounced in the right middle lobe and right upper lobe. No consolidation, pleural effusion or pneumothorax. Small solid pulmonary nodule of the right lower lobe measuring 4 mm on series 301, image 78.   Upper Abdomen: Hepatic steatosis.  No acute abnormality.   Musculoskeletal: No aggressive appearing osseous lesions.   Review of the MIP images confirms the above findings.   IMPRESSION: 1. No evidence of pulmonary embolus. 2. Patchy ground-glass opacities which are most pronounced in the right middle lobe and right upper lobe, likely infectious or inflammatory. 3. Small solid pulmonary nodule of the right lower lobe measuring 4 mm. No follow-up needed if patient is low-risk.This recommendation follows the consensus statement: Guidelines for Management of Incidental Pulmonary Nodules Detected on CT Images: From the Fleischner Society 2017; Radiology 2017; 284:228-243. 4. Hepatic steatosis. 5. Mild aortic Atherosclerosis (ICD10-I70.0).     Electronically Signed   By: Rea Marc M.D.   On: 10/27/2022 19:41     OV  01/12/2023  Subjective:  Patient ID: Ryan Holland, male , DOB: 11/04/1970 , age 19 y.o. , MRN: [id number] , ADDRESS: [address] [place] [id number] PCP Saguier, [place] [name] Patient Care Team: [name] RIGGERS as PCP - General (Internal Medicine) Kate Lonni CROME, MD as Consulting Physician (Cardiology)  This Provider for this visit: Treatment Team:  Attending Provider: Geronimo Amel, MD    01/12/2023 -   Chief Complaint  Patient presents with   Follow-up    Ct f/u & pft    Follow-up history of previous allergies but dizziness and worsening of with onset of chronic cough since July 2024: Workup in progress  HPI Edouard Gikas 54 y.o. -returns for follow-up to discuss current status and also discussed results.  Went over the history again he tells me that he has had a strong history of allergy issues.  At [facility] in 2017 skin test was positive for dust mite mold and mildew in fescue grass.  I am not able to get access to this but this is his history.  As a result of this  he had seasonal allergy symptoms especially in the spring and the fall.  This gets manifested by tickle in the throat, sinus drainage, runny eyes and red eyes.  Also some wheezing sebacic basically like hayfever.  He was on allergy shots.  In 2017 and 2020 and then stopped because of lack of change.  And then he was not on follow-up.  Most recently symptoms started in July 2024 when he started having dizziness with this the cough became ongoing.  He was has dry heaves and nausea with the cough this chest tightness there is also dizziness.  He said high risk for sleep apnea.  He had pulmonary infiltrates on CT in August 2024  Progress with issue so far -There is prior history of smoking 1 pack a day for 20 years.  - Pulmonary infiltrates on CT chest October 27, 2022: These are significantly improved on the high-resolution CT chest October 2024.  I personally visualized this and showed it to him.   He did have a subsequent CT coronary morphology on 01/08/2023.  No comment is made of the pulmonary infiltrates but I am wondering if it is back.  I am not so sure.  -Associated acid reflux symptoms: He occasionally has acid reflux but denies any overt aspiration.  -Limited allergy workup blood eosinophils and blood IgE is high   -Pulmonary function test suggest restriction for obesity but otherwise normal.  -Potential sleep apnea: Self has not seen a sleep doctor.  I am making a rereferral today.  He does use CPAP.  -Dizziness is seen Dr. Lonni Mas.  Most recent visit 01/03/2023.  Chart reviewed.  Echocardiogram in October 2024 was normal.  ZIO monitor recommended.  -He has morbid obesity 42.3 BMI   OV 10/30/2023  Subjective:  Patient ID: Ryan Holland, male , DOB: 05-May-1970 , age 74 y.o. , MRN: [id number] , ADDRESS: [address] [place] [id number] PCP Saguier, [place] [name] Patient Care Team: [name] RIGGERS as PCP - General (Internal Medicine) Kate Lonni CROME, MD as Consulting Physician (Cardiology)  This Provider for this visit: Treatment Team:  Attending Provider: Geronimo Amel, MD    10/30/2023 -   Chief Complaint  Patient presents with   Medical Management of Chronic Issues   Asthma    Breathing has been slightly worse over the past 2 wks. He has some wheezing and prod cough with clear to green sputum.      HPI Seiya Silsby 54 y.o. -Here for follow-up.  Last November 2024.  He was supposed to have CT scan in July 2025 which he did have on July 18.  It showed some of the infiltrates were improved but he had new infiltrates but 2 days later he ended up in the hospital on October 01, 2023 with human metapneumovirus infection and elevated BNP.  CT scan of the chest September 23, 2023 showed marked difference with bilateral bronchopneumonia consistent with viral infection.  He was in the hospital for 10 days discharged on steroids and on oxygen  2 L.  He says that he finished a week of steroids and then started feeling well for 2 weeks but now he is slowly coming back to all his baseline symptoms.  He still using his 2 L oxygen although when we turned it off he was normal on room air at rest and he was able to walk 400 feet without any desaturation.  He has intentionally lost weight around 30 pounds.  In terms of his asthma he continues  his asthma therapy with biologic Dupixent .  Main current issue is that his recent respiratory failure and hospitalization.  He did have a chest x-ray 2 days ago or yesterday and I personally visualized this.  It is improved compared to the hospital but there are still infiltrates.    PLAN  - Was entertaining a condition called BOOP or heart failure.  The BNP is normal.  The ESR is extremely high greater than 80 in the case of BOOP but here it is 40 and it is in the intermediate zone.  Nevertheless I think it is worth giving a 4-week prednisone  course [his blood sugar can go up blood pressure can go up   Plan  - - Start prednisone  -40 mg once daily for 1 weeks and then 30 mg once daily for 1 weeks and then 20 mg once daily for 1 weeks and then 10 mg once daily for 1 weeks then 5 mg once daily for 1 weeks and then 5 mg on Monday Wednesday Friday for 1 weeks and stop (Total 6 week course     OV 01/23/2024  Subjective:  Patient ID: Ryan Holland, male , DOB: 12-Oct-1970 , age 89 y.o. , MRN: [id number] , ADDRESS: [address] [place]  [id number] PCP Saguier, [place] [name] Patient Care Team: [name] RIGGERS as PCP - General (Internal Medicine) Kate Lonni CROME, MD as Consulting Physician (Cardiology)  This Provider for this visit: Treatment Team:  Attending Provider: Geronimo Amel, MD   #Asthma with cough and pulm infiltrates not otherwise specified on biologic therapy #Respiratory failure admission July 2025 due to human metapneumovirus with elevated BNP  01/23/2024 -    Chief Complaint  Patient presents with   Interstitial Lung Disease    Pt states since LOV breathing has been up and down, more down than good SOB w/ any activity      HPI Ryan Holland 54 y.o. - Milon Dethloff is a 54 year old male with interstitial lung disease who presents with recurrent respiratory issues and chest pain. Presents with wife who works at [employer] in pharmacy  He remains on Dupixent  for his asthma.  In July 2025 he was hospitalized for metapneumovirus send in the aftermath of that had continued hypoxemia and pulmonary infiltrates his ESR was slightly high.  Therefore put him on a 6-week prednisone  taper.  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  Right in the 6-week office final dose of prednisone : He had fever in the next day got really short of breath.  He feels the prednisone  taper contributed to this.  He was admitted to hospital CT chest showed significant ground glass opacities.  Although it was not as worse as summer 2025.  It was definitely worse than July 2025.  The procalcitonin was 1.88 respiratory virus panel, urine strep urine Legionella were all negative.  Therefore hospital-acquired pneumonia was diagnosis made.  He is worried it respiratory interstitial inflammation related.  He is also worried that it could be fluid related.  However based on my chart review the overwhelming evidence is that this is hospital-acquired pneumonia.  He got treated with antibiotics he did not get treated with steroids.  Since then he has not relapsed.  He is not on steroids currently.  He describes his chest pain as 'a lot of pressure' in the heart area, which sometimes affects his vision. He has resumed using an oxygen machine due to shortness of breath, which he partly attributes to anxiety, especially during a recent cruise trip with  his grandchildren. His oxygen levels at home sometimes drop into the nineties and high eighties, despite doing well during physical activity in the clinic.   However exercise hypoxemia test here today did not show him having any desaturations.  He has a history of acid reflux for which he takes medication. Coughing sometimes leads to vomiting, although he has not vomited in several months. He has had recurrent respiratory issues over the past few years, with similar episodes occurring twice this year and once the previous year.  He is currently on Dupixent  and Repatha .   He and his wife are concerned about - Unifying diagnosis - Unusual etiologies - Cardiac etiology    IMPRESSION: 1. Residual peribronchovascular ground-glass and nodularity with minimal traction bronchiolectasis, improved from 09/23/2023. Findings may be due to organizing pneumonia. Sequelae of pulmonary hemorrhage is another consideration. Findings are suggestive of an alternative diagnosis (not UIP) per consensus guidelines: Diagnosis of Idiopathic Pulmonary Fibrosis: An Official ATS/ERS/JRS/ALAT Clinical Practice Guideline. Am JINNY Honey Crit Care Med Vol 198, Iss 5, [phone], Nov 04 2016. 2. Liver appears mildly cirrhotic. 3.  Aortic atherosclerosis (ICD10-I70.0). 4. Enlarged pulmonic trunk, indicative of pulmonary arterial hypertension.     Electronically Signed   By: Newell Eke M.D.   On: 01/16/2024 10:33   OV 03/27/2024  Subjective:  Patient ID: Ryan Holland, male , DOB: 05/14/1970 , age 22 y.o. , MRN: [id number] , ADDRESS: [address] [place] [id number] PCP Saguier, [place] [name] Patient Care Team: [name] RIGGERS as PCP - General (Internal Medicine) Kate Lonni CROME, MD as Consulting Physician (Cardiology)  This Provider for this visit: Treatment Team:  Attending Provider: Geronimo Amel, MD  #Asthma with cough and pulm infiltrates not otherwise specified on biologic therapy  - Started on Dupixent  late 2024/early 2025 #Respiratory failure admission  - July 2025 due to human metapneumovirus with elevated BNP -> post RSV  Boop requiring steroids through October 2025 - October 2025 readmission with respiratory failure and pulmonary infiltrates: Due to HCAP versus aspiration with or without diastolic dysfunction  03/27/2024 -   Chief Complaint  Patient presents with   Medical Management of Chronic Issues   Cough   Shortness of Breath    Breathing has overall been doing well. He has cough when he wakes up in the am- prod with green to yellow sputum.      HPI Khalee Mazo 54 y.o. -obert Charlie Seda is a 54 year old male with asthma who presents with persistent chest pain and shortness of breath.  He experiences persistent dull central chest pain, particularly after exertion or coughing, accompanied by shortness of breath. His oxygen levels fluctuate, and his pulse rate is typically elevated, ranging from 100 to 120 beats per minute during these episodes. He coughs up a small amount of phlegm daily.  He has a history of asthma and has been using Dupixent  for about a year, which he believes has been beneficial. However, due to a recent change in insurance, his coverage for Dupixent  is ending, and attempts to switch to Xolair were denied due to a paperwork issue regarding his asthma diagnosis. He still has some Dupixent  left and plans to take it.  He experienced a series of respiratory issues starting with an RSV infection in June, followed by a diagnosis of bronchiolitis obliterans organizing pneumonia (BOOP) for which he was treated with steroids until October. He was hospitalized twice, once for pneumonia or aspiration and possibly due to a stiff heart muscle, all against  the backdrop of his asthma. His chest still feels bruised and is healing from these events.  He underwent a CT scan on November 11, which showed inflammation. He has allergies, particularly to dust mites, which may be contributing to his respiratory symptoms. He has dogs at home but no cats, and practices good pillow hygiene to reduce  allergen exposure.  Overall he is better  Simple office walk 224 (66+46 x 2) feet Pod A at [facility] x  3 laps goal with forehead probe 10/30/2023    O2 used ra   Number laps completed 2 of 3   Comments about pace Regular pace   Resting Pulse Ox/HR 95% and 80/min   Final Pulse Ox/HR 94% and 99/min   Desaturated </= 88% no   Desaturated <= 3% points no   Got Tachycardic >/= 90/min yes   Symptoms at end of test Little dyspneic   Miscellaneous comments ok     PFT     Latest Ref Rng & Units 01/12/2023    8:50 AM 09/10/2014    9:39 AM  PFT Results  FVC-Pre L 3.47  4.20   FVC-Predicted Pre % 68  80   FVC-Post L 3.43  3.94   FVC-Predicted Post % 67  75   Pre FEV1/FVC % % 87  85   Post FEV1/FCV % % 90  85   FEV1-Pre L 3.02  3.57   FEV1-Predicted Pre % 76  85   FEV1-Post L 3.07  3.37   DLCO uncorrected ml/min/mmHg 28.11  27.88   DLCO UNC% % 95  86   DLCO corrected ml/min/mmHg 28.11    DLCO COR %Predicted % 95    DLVA Predicted % 122  109   TLC L 5.63  5.84   TLC % Predicted % 80  84   RV % Predicted % 88  87        LAB RESULTS last 96 hours No results found.   Latest Reference Range & Units 11/20/22 10:06  IgE (Immunoglobulin E), Serum <OR=114 kU/L 612 (H)  (H): Data is abnormally high  Latest Reference Range & Units 01/23/24 12:43  D Pteronyssinus IgE Class II kU/L 0.92 !  D Farinae IgE Class II kU/L 0.77 !  Cat Dander IgE Class II kU/L 1.28 !  Dog Dander IgE Class III kU/L 2.78 !  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   reports that he quit smoking about 12 years ago. His smoking use included cigarettes. He started smoking about 32 years ago. He has a 40 pack-year smoking history. He has quit using smokeless tobacco.  His smokeless tobacco use included chew.  Past Surgical History:  Procedure Laterality Date   01/05/2011 LUMBAR DECOMPRESSION AND MICRODISCECTOMY     CERVICAL FUSION  C4-5      ESOPHAGOGASTRODUODENOSCOPY (EGD) WITH PROPOFOL  N/A 12/30/2014   Procedure: ESOPHAGOGASTRODUODENOSCOPY (EGD) WITH PROPOFOL ;  Surgeon: Elsie Cree, MD;  Location: [facility];  Service: Endoscopy;  Laterality: N/A;  With  Bravo   LUMBAR LAMINECTOMY/DECOMPRESSION MICRODISCECTOMY  07/26/2011   Procedure: LUMBAR LAMINECTOMY/DECOMPRESSION MICRODISCECTOMY;  Surgeon: [name], MD;  Location: [facility];  Service: Orthopedics;  Laterality: Right;  Re-Do Lumbar Decompression L5-S1 on Right,    PILONIDAL CYST EXCISION      Allergies[1]  Immunization History  Administered Date(s) Administered   Influenza Inj Mdck Quad Pf 11/22/2016   Influenza Whole 12/24/2013, 11/07/2014   Influenza, Seasonal, Injecte, Preservative Fre 11/20/2022, 12/25/2023   Influenza,inj,Quad PF,6+ Mos 11/08/2017, 01/27/2019, 02/20/2022   PFIZER(Purple Top)SARS-COV-2 Vaccination 06/07/2019, 07/08/2019   PNEUMOCOCCAL CONJUGATE-20 08/14/2023   Tdap 11/22/2016   Zoster Recombinant(Shingrix ) 08/14/2023, 12/25/2023    Family History  Problem Relation Age of Onset   COPD Father    Congestive Heart Failure Father    Heart disease Father        died of  congenial heart diseases   Hypertension Father    Breast cancer Mother    Hypertension Mother     Current Medications[2]      Objective:   Vitals:   03/27/24 1558  BP: 108/60  Pulse: 96  SpO2: 97%  Weight: 282 lb (127.9 kg)  Height: 5' 10 (1.778 m)    Estimated body mass index is 40.46 kg/m as  calculated from the following:   Height as of this encounter: 5' 10 (1.778 m).   Weight as of this encounter: 282 lb (127.9 kg).  @WEIGHTCHANGE @  [employer]   03/27/24 1558  Weight: 282 lb (127.9 kg)     Physical Exam   General: No distress. Looks stable O2 at rest: no Cane present: no Sitting in wheel chair: no Frail: no Obese: yes Neuro: Alert and Oriented x 3. GCS 15. Speech normal Psych: Pleasant Resp:  Barrel Chest - no.  Wheeze - no, Crackles - no, No overt respiratory distress CVS: Normal heart sounds. Murmurs - no Ext: Stigmata of Connective Tissue Disease - no HEENT: Normal upper airway. PEERL +. No post nasal drip        Assessment/     Assessment & Plan Severe persistent asthma, uncomplicated (HCC)  Eosinophilia, unspecified type  Extrinsic asthma without complication, unspecified asthma severity, unspecified whether persistent  House dust mite allergy  History of acute respiratory failure  History of recurrent pneumonia    PLAN Patient Instructions  Severe persistent asthma, uncomplicated (HCC) Eosinophilia, unspecified type Extrinsic asthma without complication, unspecified asthma severity, unspecified whether persistent House dust mite allergy  - Dupixent  since late 2024/early 2025 is really helped you.  Unclear why insurance is denying it  Plan - I will reach out to the pharmacy team and figure out if we can get your Dupixent  renewed - Meanwhile, continue what ever Dupixent  you have - Continue Breo - Continue Singulair  - Continue Flonase  - At some point we can start tapering this  History of acute respiratory failure History of recurrent pneumonia  -July 2025 RSV infection and then postviral Boop requiring steroids for October 2025 and then repeat hospitalization for respiratory failure in October 2025 secondary to HCAP versus aspiration +/- diastolic dysfunction  --Currently this visit improved  Plan - Do repeat CT chest  without contrast late part of February 2026 [88-month follow-up]  - Avoid respiratory illness sick exposure and control your risk for respiratory infection  - be uptodate with all respiratory vaccines  - avoid sick contacts especially in areas of indoor clusters (churches, weddings, funerals, family gatherings, birthdays, planes, malls, indoor areas especially)   - mask in these  areas   - discourage sick people from coming in close contact with you  - control acid reflux   Followup  -clinical followup in 8 weeks wit APP but after CT chest    FOLLOWUP    Return in about 2 months (around 05/25/2024) for with any of the APPS.    SIGNATURE    Dr. Dorethia Cave, M.D., F.C.C.P,  Pulmonary and Critical Care Medicine Staff Physician, [facility] Director - Interstitial Lung Disease  Program  Pulmonary Fibrosis [facility] at [facility] [place], [place], [id number]  Pager: [phone], If no answer or between  15:00h - 7:00h: call [phone] Telephone: [phone]  4:51 PM 03/27/2024    [1]  Allergies Allergen Reactions   Other     LEMON-sores in mouth  [2]  Current Outpatient Medications:    acamprosate  (CAMPRAL ) 333 MG tablet, Take 2 tablets (666 mg total) by mouth 3 (three) times daily., Disp: 180 tablet, Rfl: 0   albuterol  (PROAIR  HFA) 108 (90 Base) MCG/ACT inhaler, Inhale 2 puffs into the lungs every 4 (four) hours as needed for wheezing or shortness of breath., Disp: 6.7 g, Rfl: 5   benzonatate  (TESSALON ) 100 MG capsule, Take 1 capsule (100 mg total) by mouth 3 (three) times daily as needed for cough., Disp: 30 capsule, Rfl: 0   cholecalciferol (VITAMIN D3) 25 MCG (1000 UNIT) tablet, Take 1,000 Units by mouth as directed., Disp: , Rfl:    citalopram  (CELEXA ) 40 MG tablet, Take 1 tablet (40 mg total) by mouth daily., Disp: 90 tablet, Rfl: 0   clonazePAM  (KLONOPIN ) 0.5 MG tablet, Take 1 tablet (0.5 mg total) by mouth at bedtime as  needed anxiety and insomnia, Disp: 10 tablet, Rfl: 0   Dupilumab  (DUPIXENT ) 300 MG/2ML SOAJ, Inject 300 mg into the skin every 14 (fourteen) days., Disp: 12 mL, Rfl: 1   Evolocumab  (REPATHA  SURECLICK) 140 MG/ML SOAJ, Inject 140 mg into the skin every 14 (fourteen) days., Disp: 6 mL, Rfl: 1   fluticasone  (FLONASE ) 50 MCG/ACT nasal spray, Place 2 sprays into both nostrils daily., Disp: 16 g, Rfl: 0   fluticasone  furoate-vilanterol (BREO ELLIPTA ) 200-25 MCG/ACT AEPB, Inhale 1 puff into the lungs daily., Disp: 60 each, Rfl: 5   methocarbamol  (ROBAXIN ) 500 MG tablet, Take 1 tablet (500 mg total) by mouth up to 3 (three) times daily as needed for back pain/spasms., Disp: 30 tablet, Rfl: 2   montelukast  (SINGULAIR ) 10 MG tablet, Take 1 tablet (10 mg total) by mouth daily., Disp: 90 tablet, Rfl: 3   pantoprazole  (PROTONIX ) 40 MG tablet, Take 1 tablet (40 mg total) by mouth daily., Disp: 90 tablet, Rfl: 3   valsartan  (DIOVAN ) 80 MG tablet, Take 1 tablet (80 mg total) by mouth daily., Disp: 30 tablet, Rfl: 0   diphenhydrAMINE (BENADRYL) 25 mg capsule, Take 50 mg by mouth in the morning. (Patient not taking: Reported on 03/27/2024), Disp: , Rfl:   "

## 2024-03-27 NOTE — Patient Instructions (Addendum)
 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  Unclear why insurance is denying it  Plan - I will reach out to the pharmacy team and figure out if we can get your Dupixent  renewed - Meanwhile, continue what ever Dupixent  you have - Continue Breo - Continue Singulair  - Continue Flonase  - At some point we can start tapering this  History of acute respiratory failure History of recurrent pneumonia  -July 2025 RSV infection and then postviral Boop requiring steroids for October 2025 and then repeat hospitalization for respiratory failure in October 2025 secondary to HCAP versus aspiration +/- diastolic dysfunction  --Currently this visit improved  Plan - Do repeat CT chest without contrast late part of February 2026 [11-month follow-up]  - Avoid respiratory illness sick exposure and control your risk for respiratory infection  - be uptodate with all respiratory vaccines  - avoid sick contacts especially in areas of indoor clusters (churches, weddings, funerals, family gatherings, birthdays, planes, malls, indoor areas especially)   - mask in these areas   - discourage sick people from coming in close contact with you  - control acid reflux   Followup  -clinical followup in 8 weeks wit APP but after CT chest

## 2024-03-27 NOTE — Telephone Encounter (Signed)
 Rx team  Wondering why his Dupixent  got rejected.  He says the medicine really worked well for him

## 2024-03-28 ENCOUNTER — Other Ambulatory Visit (HOSPITAL_BASED_OUTPATIENT_CLINIC_OR_DEPARTMENT_OTHER): Payer: Self-pay

## 2024-03-28 ENCOUNTER — Other Ambulatory Visit: Payer: Self-pay

## 2024-03-28 ENCOUNTER — Ambulatory Visit

## 2024-03-28 VITALS — BP 136/86 | HR 70

## 2024-03-28 DIAGNOSIS — Z23 Encounter for immunization: Secondary | ICD-10-CM

## 2024-03-28 DIAGNOSIS — I10 Essential (primary) hypertension: Secondary | ICD-10-CM

## 2024-03-28 MED ORDER — VALSARTAN 80 MG PO TABS
80.0000 mg | ORAL_TABLET | Freq: Every day | ORAL | 3 refills | Status: AC
  Filled 2024-03-28: qty 90, 90d supply, fill #0

## 2024-03-28 NOTE — Addendum Note (Signed)
 Addended by: [name] DORINA [name] M on: 03/28/2024 02:44 PM   Modules accepted: Orders

## 2024-03-28 NOTE — Progress Notes (Signed)
 Pt here for Blood pressure check per Edward Saguier,[name] on 03/17/2024: Scheduled nurse visit in 7-10 days for blood pressure check on valsartan .   Pt currently takes:Valsartan  80 MG   Pt reports compliance with medication.  BP today @ =136/86 HR =70  Pt advised per Edward Saguier,[name] continue current medication and follow-up in 3 weeks.     Pt here for a Hep B vaccine per [place] Saguier,[name]: Your hep b surface antibody is low. This indicate that you lack immunity so recommend call office to get set up for hep b vaccine.   He received vaccine in left  deltoid and tolerated it well.

## 2024-03-28 NOTE — Telephone Encounter (Signed)
 IgE 612 (11/20/22) and TBW 127.9kg (03/27/24) -   Dosing: 375mg  q2 weeks

## 2024-03-31 ENCOUNTER — Other Ambulatory Visit: Payer: Self-pay

## 2024-03-31 NOTE — Telephone Encounter (Signed)
 Received a fax regarding Prior Authorization from [facility] for XOLAIR. Authorization has been DENIED because must provide member's current weight, and requested dose based on IgE level and weight.  Phone# [phone]  Resubmitted pa with dosing information Aleck provided. Will updated when we receive a response.  Key: [id number]

## 2024-04-02 ENCOUNTER — Other Ambulatory Visit: Payer: Self-pay

## 2024-04-02 ENCOUNTER — Other Ambulatory Visit (HOSPITAL_COMMUNITY): Payer: Self-pay

## 2024-04-04 ENCOUNTER — Other Ambulatory Visit (HOSPITAL_COMMUNITY): Payer: Self-pay

## 2024-04-04 NOTE — Telephone Encounter (Signed)
 Sounds good

## 2024-04-04 NOTE — Telephone Encounter (Signed)
 Received a fax regarding Prior Authorization from [facility] for XOLAIR. Authorization has been DENIED because: The requested dose, based on IgE level and weight, falls within the recommended dosing guidelines from the manufacturer. **Per the patient's weight (282 lbs) and pre-treatment IgE levels, no dosing regimen is safe and effective for the patient.  Phone# [phone]

## 2024-04-07 ENCOUNTER — Other Ambulatory Visit (HOSPITAL_COMMUNITY): Payer: Self-pay

## 2024-04-07 NOTE — Telephone Encounter (Signed)
 Received message from pt's wife asking what biologics are on formulary. It appears Nucala and Fasenra are formulary, Tezspire is not.  Submitted a Prior Authorization request to [facility] for NUCALA via CoverMyMeds. Will update once we receive a response.  Key: [facility]   Submitted a Prior Authorization request to [facility] for [facility] via CoverMyMeds. Will update once we receive a response.  Key: [id number]

## 2024-04-08 ENCOUNTER — Other Ambulatory Visit (HOSPITAL_COMMUNITY): Payer: Self-pay

## 2024-04-08 ENCOUNTER — Encounter (HOSPITAL_COMMUNITY): Payer: Self-pay

## 2024-04-28 ENCOUNTER — Other Ambulatory Visit

## 2024-04-28 ENCOUNTER — Ambulatory Visit

## 2024-04-28 ENCOUNTER — Ambulatory Visit: Admitting: Medical

## 2024-05-26 ENCOUNTER — Ambulatory Visit: Admitting: Adult Health

## 2024-12-09 ENCOUNTER — Ambulatory Visit: Admitting: Dermatology

## 2024-12-10 ENCOUNTER — Ambulatory Visit: Admitting: Dermatology
